# Patient Record
Sex: Female | Born: 1970 | Race: Black or African American | Hispanic: No | Marital: Married | Smoking: Never smoker
Health system: Southern US, Community
[De-identification: ages and names within clinical notes are randomized; demographics above are authoritative.]

## PROBLEM LIST (undated history)

## (undated) ENCOUNTER — Inpatient Hospital Stay (HOSPITAL_COMMUNITY): Payer: Self-pay

## (undated) DIAGNOSIS — Z794 Long term (current) use of insulin: Secondary | ICD-10-CM

## (undated) DIAGNOSIS — G4733 Obstructive sleep apnea (adult) (pediatric): Secondary | ICD-10-CM

## (undated) DIAGNOSIS — Z87898 Personal history of other specified conditions: Secondary | ICD-10-CM

## (undated) DIAGNOSIS — R350 Frequency of micturition: Secondary | ICD-10-CM

## (undated) DIAGNOSIS — N920 Excessive and frequent menstruation with regular cycle: Secondary | ICD-10-CM

## (undated) DIAGNOSIS — I10 Essential (primary) hypertension: Secondary | ICD-10-CM

## (undated) DIAGNOSIS — G629 Polyneuropathy, unspecified: Secondary | ICD-10-CM

## (undated) DIAGNOSIS — E785 Hyperlipidemia, unspecified: Secondary | ICD-10-CM

## (undated) DIAGNOSIS — E1165 Type 2 diabetes mellitus with hyperglycemia: Secondary | ICD-10-CM

## (undated) DIAGNOSIS — R3915 Urgency of urination: Secondary | ICD-10-CM

## (undated) DIAGNOSIS — K602 Anal fissure, unspecified: Secondary | ICD-10-CM

## (undated) DIAGNOSIS — E049 Nontoxic goiter, unspecified: Secondary | ICD-10-CM

## (undated) DIAGNOSIS — K5909 Other constipation: Secondary | ICD-10-CM

## (undated) DIAGNOSIS — Z8619 Personal history of other infectious and parasitic diseases: Secondary | ICD-10-CM

## (undated) DIAGNOSIS — E282 Polycystic ovarian syndrome: Secondary | ICD-10-CM

## (undated) DIAGNOSIS — K219 Gastro-esophageal reflux disease without esophagitis: Secondary | ICD-10-CM

## (undated) HISTORY — DX: Hyperlipidemia, unspecified: E78.5

## (undated) HISTORY — DX: Polycystic ovarian syndrome: E28.2

## (undated) HISTORY — PX: BREAST REDUCTION SURGERY: SHX8

## (undated) HISTORY — DX: Personal history of other infectious and parasitic diseases: Z86.19

## (undated) HISTORY — DX: Essential (primary) hypertension: I10

---

## 1999-10-23 ENCOUNTER — Other Ambulatory Visit: Admission: RE | Admit: 1999-10-23 | Discharge: 1999-10-23 | Payer: Self-pay | Admitting: Obstetrics and Gynecology

## 1999-11-23 ENCOUNTER — Other Ambulatory Visit: Admission: RE | Admit: 1999-11-23 | Discharge: 1999-11-23 | Payer: Self-pay | Admitting: Obstetrics and Gynecology

## 1999-11-23 ENCOUNTER — Encounter (INDEPENDENT_AMBULATORY_CARE_PROVIDER_SITE_OTHER): Payer: Self-pay

## 1999-12-24 ENCOUNTER — Ambulatory Visit (HOSPITAL_COMMUNITY): Admission: RE | Admit: 1999-12-24 | Discharge: 1999-12-24 | Payer: Self-pay

## 2000-02-11 ENCOUNTER — Encounter: Admission: RE | Admit: 2000-02-11 | Discharge: 2000-05-11 | Payer: Self-pay | Admitting: Family Medicine

## 2000-04-23 ENCOUNTER — Other Ambulatory Visit: Admission: RE | Admit: 2000-04-23 | Discharge: 2000-04-23 | Payer: Self-pay | Admitting: Obstetrics and Gynecology

## 2000-07-12 ENCOUNTER — Emergency Department (HOSPITAL_COMMUNITY): Admission: EM | Admit: 2000-07-12 | Discharge: 2000-07-12 | Payer: Self-pay | Admitting: Emergency Medicine

## 2002-05-05 ENCOUNTER — Other Ambulatory Visit: Admission: RE | Admit: 2002-05-05 | Discharge: 2002-05-05 | Payer: Self-pay | Admitting: Obstetrics and Gynecology

## 2002-09-30 ENCOUNTER — Emergency Department (HOSPITAL_COMMUNITY): Admission: EM | Admit: 2002-09-30 | Discharge: 2002-09-30 | Payer: Self-pay | Admitting: Emergency Medicine

## 2002-09-30 ENCOUNTER — Other Ambulatory Visit: Admission: RE | Admit: 2002-09-30 | Discharge: 2002-09-30 | Payer: Self-pay | Admitting: Obstetrics and Gynecology

## 2002-10-12 ENCOUNTER — Encounter: Payer: Self-pay | Admitting: *Deleted

## 2002-10-12 ENCOUNTER — Emergency Department (HOSPITAL_COMMUNITY): Admission: EM | Admit: 2002-10-12 | Discharge: 2002-10-12 | Payer: Self-pay | Admitting: *Deleted

## 2003-06-07 ENCOUNTER — Other Ambulatory Visit: Admission: RE | Admit: 2003-06-07 | Discharge: 2003-06-07 | Payer: Self-pay | Admitting: Obstetrics and Gynecology

## 2003-07-18 ENCOUNTER — Ambulatory Visit (HOSPITAL_BASED_OUTPATIENT_CLINIC_OR_DEPARTMENT_OTHER): Admission: RE | Admit: 2003-07-18 | Discharge: 2003-07-18 | Payer: Self-pay | Admitting: Specialist

## 2003-07-18 ENCOUNTER — Encounter (INDEPENDENT_AMBULATORY_CARE_PROVIDER_SITE_OTHER): Payer: Self-pay | Admitting: *Deleted

## 2003-07-18 ENCOUNTER — Ambulatory Visit (HOSPITAL_COMMUNITY): Admission: RE | Admit: 2003-07-18 | Discharge: 2003-07-18 | Payer: Self-pay | Admitting: Specialist

## 2003-11-13 ENCOUNTER — Emergency Department (HOSPITAL_COMMUNITY): Admission: EM | Admit: 2003-11-13 | Discharge: 2003-11-13 | Payer: Self-pay | Admitting: Emergency Medicine

## 2004-04-02 ENCOUNTER — Emergency Department (HOSPITAL_COMMUNITY): Admission: EM | Admit: 2004-04-02 | Discharge: 2004-04-02 | Payer: Self-pay | Admitting: Emergency Medicine

## 2004-06-07 ENCOUNTER — Ambulatory Visit: Payer: Self-pay | Admitting: Family Medicine

## 2004-06-11 ENCOUNTER — Ambulatory Visit: Payer: Self-pay | Admitting: Family Medicine

## 2004-06-19 ENCOUNTER — Other Ambulatory Visit: Admission: RE | Admit: 2004-06-19 | Discharge: 2004-06-19 | Payer: Self-pay | Admitting: Obstetrics and Gynecology

## 2004-07-04 ENCOUNTER — Ambulatory Visit: Payer: Self-pay | Admitting: Internal Medicine

## 2004-07-11 ENCOUNTER — Ambulatory Visit: Payer: Self-pay | Admitting: Internal Medicine

## 2004-07-18 ENCOUNTER — Ambulatory Visit: Payer: Self-pay | Admitting: Family Medicine

## 2004-08-06 ENCOUNTER — Ambulatory Visit: Payer: Self-pay | Admitting: Internal Medicine

## 2004-10-06 ENCOUNTER — Emergency Department (HOSPITAL_COMMUNITY): Admission: EM | Admit: 2004-10-06 | Discharge: 2004-10-06 | Payer: Self-pay | Admitting: Emergency Medicine

## 2004-11-09 ENCOUNTER — Ambulatory Visit: Payer: Self-pay | Admitting: Family Medicine

## 2004-12-14 ENCOUNTER — Ambulatory Visit: Payer: Self-pay | Admitting: Family Medicine

## 2005-04-18 ENCOUNTER — Ambulatory Visit: Payer: Self-pay | Admitting: Family Medicine

## 2005-06-21 ENCOUNTER — Ambulatory Visit: Payer: Self-pay | Admitting: Family Medicine

## 2005-07-12 ENCOUNTER — Ambulatory Visit: Payer: Self-pay | Admitting: Family Medicine

## 2005-08-03 ENCOUNTER — Ambulatory Visit (HOSPITAL_BASED_OUTPATIENT_CLINIC_OR_DEPARTMENT_OTHER): Admission: RE | Admit: 2005-08-03 | Discharge: 2005-08-03 | Payer: Self-pay | Admitting: Family Medicine

## 2005-08-27 ENCOUNTER — Ambulatory Visit: Payer: Self-pay | Admitting: Pulmonary Disease

## 2005-10-14 ENCOUNTER — Ambulatory Visit: Payer: Self-pay | Admitting: Pulmonary Disease

## 2005-10-18 ENCOUNTER — Ambulatory Visit: Payer: Self-pay | Admitting: Family Medicine

## 2006-01-30 ENCOUNTER — Ambulatory Visit: Payer: Self-pay | Admitting: Pulmonary Disease

## 2006-05-02 ENCOUNTER — Ambulatory Visit: Payer: Self-pay | Admitting: Family Medicine

## 2006-05-30 ENCOUNTER — Ambulatory Visit: Payer: Self-pay | Admitting: Family Medicine

## 2006-05-30 LAB — CONVERTED CEMR LAB
AST: 31 units/L (ref 0–37)
Albumin: 4 g/dL (ref 3.5–5.2)
Alkaline Phosphatase: 64 units/L (ref 39–117)
BUN: 12 mg/dL (ref 6–23)
Basophils Absolute: 0 10*3/uL (ref 0.0–0.1)
Basophils Relative: 0.5 % (ref 0.0–1.0)
CO2: 27 meq/L (ref 19–32)
Chloride: 101 meq/L (ref 96–112)
Creatinine, Ser: 0.8 mg/dL (ref 0.4–1.2)
HCT: 37.4 % (ref 36.0–46.0)
MCHC: 34.6 g/dL (ref 30.0–36.0)
Neutrophils Relative %: 48.2 % (ref 43.0–77.0)
RBC: 4.19 M/uL (ref 3.87–5.11)
RDW: 12.2 % (ref 11.5–14.6)
Total Bilirubin: 0.7 mg/dL (ref 0.3–1.2)

## 2006-06-03 ENCOUNTER — Encounter: Payer: Self-pay | Admitting: Family Medicine

## 2006-09-18 DIAGNOSIS — I10 Essential (primary) hypertension: Secondary | ICD-10-CM | POA: Insufficient documentation

## 2006-09-19 ENCOUNTER — Telehealth (INDEPENDENT_AMBULATORY_CARE_PROVIDER_SITE_OTHER): Payer: Self-pay | Admitting: *Deleted

## 2006-09-26 ENCOUNTER — Telehealth (INDEPENDENT_AMBULATORY_CARE_PROVIDER_SITE_OTHER): Payer: Self-pay | Admitting: *Deleted

## 2006-09-30 ENCOUNTER — Ambulatory Visit: Payer: Self-pay | Admitting: Family Medicine

## 2006-10-01 ENCOUNTER — Telehealth (INDEPENDENT_AMBULATORY_CARE_PROVIDER_SITE_OTHER): Payer: Self-pay | Admitting: *Deleted

## 2007-01-29 ENCOUNTER — Encounter (INDEPENDENT_AMBULATORY_CARE_PROVIDER_SITE_OTHER): Payer: Self-pay | Admitting: *Deleted

## 2007-05-21 ENCOUNTER — Ambulatory Visit: Payer: Self-pay | Admitting: Family Medicine

## 2007-05-21 DIAGNOSIS — I10 Essential (primary) hypertension: Secondary | ICD-10-CM | POA: Insufficient documentation

## 2007-05-21 DIAGNOSIS — J019 Acute sinusitis, unspecified: Secondary | ICD-10-CM

## 2007-07-15 ENCOUNTER — Emergency Department (HOSPITAL_COMMUNITY): Admission: EM | Admit: 2007-07-15 | Discharge: 2007-07-15 | Payer: Self-pay | Admitting: Emergency Medicine

## 2007-09-24 ENCOUNTER — Telehealth (INDEPENDENT_AMBULATORY_CARE_PROVIDER_SITE_OTHER): Payer: Self-pay | Admitting: *Deleted

## 2007-11-10 ENCOUNTER — Ambulatory Visit: Payer: Self-pay | Admitting: Family Medicine

## 2007-11-10 DIAGNOSIS — L0390 Cellulitis, unspecified: Secondary | ICD-10-CM

## 2007-11-10 DIAGNOSIS — L0291 Cutaneous abscess, unspecified: Secondary | ICD-10-CM | POA: Insufficient documentation

## 2007-12-23 ENCOUNTER — Ambulatory Visit: Payer: Self-pay | Admitting: Family Medicine

## 2007-12-27 ENCOUNTER — Emergency Department (HOSPITAL_COMMUNITY): Admission: EM | Admit: 2007-12-27 | Discharge: 2007-12-27 | Payer: Self-pay | Admitting: Emergency Medicine

## 2008-01-28 ENCOUNTER — Encounter: Payer: Self-pay | Admitting: Family Medicine

## 2008-05-27 ENCOUNTER — Emergency Department (HOSPITAL_COMMUNITY): Admission: EM | Admit: 2008-05-27 | Discharge: 2008-05-27 | Payer: Self-pay | Admitting: Emergency Medicine

## 2008-06-13 ENCOUNTER — Ambulatory Visit: Payer: Self-pay | Admitting: Family Medicine

## 2008-06-13 LAB — CONVERTED CEMR LAB
Ketones, urine, test strip: NEGATIVE
Nitrite: NEGATIVE
Protein, U semiquant: NEGATIVE
Urobilinogen, UA: NEGATIVE

## 2008-06-14 ENCOUNTER — Encounter: Payer: Self-pay | Admitting: Family Medicine

## 2008-06-16 ENCOUNTER — Encounter: Payer: Self-pay | Admitting: Family Medicine

## 2008-06-17 ENCOUNTER — Encounter (INDEPENDENT_AMBULATORY_CARE_PROVIDER_SITE_OTHER): Payer: Self-pay | Admitting: *Deleted

## 2008-07-01 LAB — CONVERTED CEMR LAB
ALT: 51 units/L — ABNORMAL HIGH (ref 0–35)
Bilirubin, Direct: 0 mg/dL (ref 0.0–0.3)
Calcium: 9.2 mg/dL (ref 8.4–10.5)
GFR calc non Af Amer: 91.38 mL/min (ref >60)
Glucose, Bld: 162 mg/dL — ABNORMAL HIGH (ref 70–99)
HDL: 36.1 mg/dL — ABNORMAL LOW (ref >39.00)
MCHC: 34.4 g/dL (ref 30.0–36.0)
RBC: 4.73 M/uL (ref 3.87–5.11)
Sodium: 139 meq/L (ref 135–145)
TSH: 1.17 microintl units/mL (ref 0.35–5.50)
Total Bilirubin: 0.7 mg/dL (ref 0.3–1.2)
Total CHOL/HDL Ratio: 6
Triglycerides: 152 mg/dL — ABNORMAL HIGH (ref 0.0–149.0)
VLDL: 30.4 mg/dL (ref 0.0–40.0)
WBC: 5.9 10*3/uL (ref 4.5–10.5)

## 2008-07-04 ENCOUNTER — Encounter (INDEPENDENT_AMBULATORY_CARE_PROVIDER_SITE_OTHER): Payer: Self-pay | Admitting: *Deleted

## 2008-09-21 ENCOUNTER — Telehealth (INDEPENDENT_AMBULATORY_CARE_PROVIDER_SITE_OTHER): Payer: Self-pay | Admitting: *Deleted

## 2008-10-21 ENCOUNTER — Encounter: Payer: Self-pay | Admitting: Family Medicine

## 2008-12-13 ENCOUNTER — Encounter: Payer: Self-pay | Admitting: Family Medicine

## 2008-12-22 ENCOUNTER — Telehealth: Payer: Self-pay | Admitting: Family Medicine

## 2009-01-10 ENCOUNTER — Ambulatory Visit: Payer: Self-pay | Admitting: Family Medicine

## 2009-01-10 DIAGNOSIS — E040 Nontoxic diffuse goiter: Secondary | ICD-10-CM | POA: Insufficient documentation

## 2009-01-10 DIAGNOSIS — R1310 Dysphagia, unspecified: Secondary | ICD-10-CM | POA: Insufficient documentation

## 2009-01-12 ENCOUNTER — Encounter: Payer: Self-pay | Admitting: Family Medicine

## 2009-01-12 LAB — CONVERTED CEMR LAB: TSH: 1.34 microintl units/mL (ref 0.35–5.50)

## 2009-01-13 ENCOUNTER — Encounter: Admission: RE | Admit: 2009-01-13 | Discharge: 2009-01-13 | Payer: Self-pay | Admitting: Internal Medicine

## 2009-02-03 ENCOUNTER — Telehealth (INDEPENDENT_AMBULATORY_CARE_PROVIDER_SITE_OTHER): Payer: Self-pay | Admitting: *Deleted

## 2009-05-12 ENCOUNTER — Encounter: Payer: Self-pay | Admitting: Family Medicine

## 2009-06-29 ENCOUNTER — Ambulatory Visit: Payer: Self-pay | Admitting: Family Medicine

## 2009-07-13 ENCOUNTER — Telehealth (INDEPENDENT_AMBULATORY_CARE_PROVIDER_SITE_OTHER): Payer: Self-pay | Admitting: *Deleted

## 2009-08-02 ENCOUNTER — Ambulatory Visit: Payer: Self-pay | Admitting: Family Medicine

## 2009-08-02 DIAGNOSIS — E282 Polycystic ovarian syndrome: Secondary | ICD-10-CM

## 2009-08-07 LAB — CONVERTED CEMR LAB
ALT: 32 units/L (ref 0–35)
AST: 23 units/L (ref 0–37)
Alkaline Phosphatase: 72 units/L (ref 39–117)
Basophils Absolute: 0 10*3/uL (ref 0.0–0.1)
Calcium: 10.1 mg/dL (ref 8.4–10.5)
Eosinophils Relative: 1.9 % (ref 0.0–5.0)
GFR calc non Af Amer: 90.8 mL/min (ref >60)
HCT: 43.7 % (ref 36.0–46.0)
HDL: 41.9 mg/dL (ref >39.00)
Hemoglobin: 14.6 g/dL (ref 12.0–15.0)
Lymphocytes Relative: 41.4 % (ref 12.0–46.0)
Lymphs Abs: 3.8 10*3/uL (ref 0.7–4.0)
Monocytes Relative: 5.5 % (ref 3.0–12.0)
Neutro Abs: 4.6 10*3/uL (ref 1.4–7.7)
Platelets: 339 10*3/uL (ref 150.0–400.0)
Potassium: 3.6 meq/L (ref 3.5–5.1)
Sodium: 143 meq/L (ref 135–145)
TSH: 2.03 microintl units/mL (ref 0.35–5.50)
Total Bilirubin: 0.6 mg/dL (ref 0.3–1.2)
Triglycerides: 80 mg/dL (ref 0.0–149.0)
VLDL: 16 mg/dL (ref 0.0–40.0)
WBC: 9.1 10*3/uL (ref 4.5–10.5)

## 2009-08-31 ENCOUNTER — Telehealth (INDEPENDENT_AMBULATORY_CARE_PROVIDER_SITE_OTHER): Payer: Self-pay | Admitting: *Deleted

## 2009-09-01 ENCOUNTER — Ambulatory Visit: Payer: Self-pay | Admitting: Family Medicine

## 2009-09-08 ENCOUNTER — Encounter: Payer: Self-pay | Admitting: Family Medicine

## 2009-10-04 ENCOUNTER — Emergency Department (HOSPITAL_COMMUNITY): Admission: EM | Admit: 2009-10-04 | Discharge: 2009-10-04 | Payer: Self-pay | Admitting: Family Medicine

## 2009-11-02 ENCOUNTER — Encounter: Admission: RE | Admit: 2009-11-02 | Discharge: 2009-12-29 | Payer: Self-pay | Admitting: Family Medicine

## 2009-11-02 ENCOUNTER — Encounter: Payer: Self-pay | Admitting: Family Medicine

## 2009-12-08 ENCOUNTER — Ambulatory Visit: Payer: Self-pay | Admitting: Family Medicine

## 2009-12-08 DIAGNOSIS — R519 Headache, unspecified: Secondary | ICD-10-CM | POA: Insufficient documentation

## 2009-12-08 DIAGNOSIS — R51 Headache: Secondary | ICD-10-CM

## 2009-12-11 LAB — CONVERTED CEMR LAB
AST: 36 units/L (ref 0–37)
Albumin: 4.3 g/dL (ref 3.5–5.2)
BUN: 11 mg/dL (ref 6–23)
Basophils Absolute: 0 10*3/uL (ref 0.0–0.1)
Basophils Relative: 0 % (ref 0–1)
Bilirubin, Direct: <0.1 mg/dL (ref 0.0–0.3)
Calcium: 10.2 mg/dL (ref 8.4–10.5)
Creatinine, Ser: 0.82 mg/dL (ref 0.40–1.20)
Eosinophils Absolute: 0.2 10*3/uL (ref 0.0–0.7)
Eosinophils Relative: 2 % (ref 0–5)
Glucose, Bld: 92 mg/dL (ref 70–99)
Hemoglobin: 13.1 g/dL (ref 12.0–15.0)
MCHC: 32.9 g/dL (ref 30.0–36.0)
MCV: 89.4 fL (ref 78.0–100.0)
Monocytes Absolute: 0.5 10*3/uL (ref 0.1–1.0)
Monocytes Relative: 6 % (ref 3–12)
Neutro Abs: 3.5 10*3/uL (ref 1.7–7.7)
Potassium: 4.1 meq/L (ref 3.5–5.3)
RBC: 4.45 M/uL (ref 3.87–5.11)
RDW: 12.8 % (ref 11.5–15.5)
Total Bilirubin: 0.2 mg/dL — ABNORMAL LOW (ref 0.3–1.2)

## 2009-12-14 ENCOUNTER — Ambulatory Visit: Payer: Self-pay | Admitting: Internal Medicine

## 2010-01-01 ENCOUNTER — Ambulatory Visit: Payer: Self-pay | Admitting: Family Medicine

## 2010-01-11 ENCOUNTER — Encounter: Payer: Self-pay | Admitting: Family Medicine

## 2010-02-14 ENCOUNTER — Emergency Department (HOSPITAL_COMMUNITY): Admission: EM | Admit: 2010-02-14 | Discharge: 2010-02-14 | Payer: Self-pay | Admitting: Emergency Medicine

## 2010-02-14 ENCOUNTER — Emergency Department (HOSPITAL_COMMUNITY): Admission: EM | Admit: 2010-02-14 | Discharge: 2010-02-14 | Payer: Self-pay | Admitting: Family Medicine

## 2010-04-29 LAB — CONVERTED CEMR LAB
ALT: 40 units/L — ABNORMAL HIGH (ref 0–35)
BUN: 10 mg/dL (ref 6–23)
Bilirubin, Direct: 0 mg/dL (ref 0.0–0.3)
Chloride: 107 meq/L (ref 96–112)
GFR calc non Af Amer: 104.08 mL/min (ref >60)
Glucose, Bld: 129 mg/dL — ABNORMAL HIGH (ref 70–99)
Potassium: 3.8 meq/L (ref 3.5–5.1)
Total Bilirubin: 0.5 mg/dL (ref 0.3–1.2)

## 2010-05-01 NOTE — Assessment & Plan Note (Signed)
Summary: cough,congestion/alr   Vital Signs:  Patient profile:   40 year old female Weight:      229 pounds O2 Sat:      100 % on Room air Temp:     100.4 degrees F oral Pulse rate:   76 / minute BP sitting:   120 / 84  (left arm)  Vitals Entered By: Doristine Devoid (September 01, 2009 1:11 PM)  O2 Flow:  Room air CC: cough and congestion    History of Present Illness: 40 yo woman here today for cough and congestion.  has been using steroid nasal spray during allergy season w/ adequate results until last weekend when chest became sore, congested, now coughing- intermittantly productive.  + nasal congestion in AM.  no fevers at home.  + facial pain/pressure, HAs.  intermittant ear pain.  Allergies (verified): No Known Drug Allergies  Review of Systems      See HPI  Physical Exam  General:  Well-developed,well-nourished,in no acute distress; alert,appropriate and cooperative throughout examination Head:  Normocephalic and atraumatic without obvious abnormalities. No apparent alopecia or balding.  + TTP over frontal and maxillary sinuses Eyes:  no injxn or inflammation, + bilateral allergic shiners Ears:  External ear exam shows no significant lesions or deformities.  Otoscopic examination reveals clear canals, tympanic membranes are intact bilaterally without bulging, retraction, inflammation or discharge. Hearing is grossly normal bilaterally. Nose:  marked turbinate edema Mouth:  Oral mucosa and oropharynx without lesions or exudates.  Teeth in good repair.  + PND Neck:  No deformities, masses, or tenderness noted. Lungs:  Normal respiratory effort, chest expands symmetrically. Lungs are clear to auscultation, no crackles or wheezes. Heart:  normal rate and no murmur.     Impression & Recommendations:  Problem # 1:  SINUSITIS- ACUTE-NOS (ICD-461.9) Assessment Unchanged pt's sxs and PE consistent w/ sinus infxn.  start high dose amox.  reviewed supportive care and red flags that  should prompt return.  Pt expresses understanding and is in agreement w/ this plan. The following medications were removed from the medication list:    Hydromet 5-1.5 Mg/85ml Syrp (Hydrocodone-homatropine) .Marland Kitchen... As needed. 1 tsp. Her updated medication list for this problem includes:    Amoxicillin 500 Mg Tabs (Amoxicillin) .Marland Kitchen... 2 tabs by mouth two times a day x10 days.  Complete Medication List: 1)  Metformin Hcl 500 Mg Xr24h-tab (Metformin hcl) .Marland Kitchen.. 1 by mouth once daily 2)  Nifedical Xl 30 Mg Xr24h-tab (Nifedipine) .... One tablet daily. 3)  P D Natal Vitamins/folic Acid Tabs (Prenatal multivit-min-fe-fa) .... As directed. 4)  Glimepiride 1 Mg Tabs (Glimepiride) .Marland Kitchen.. 1 by mouth daily. 5)  Vitamin D (ergocalciferol) 50000 Unit Caps (Ergocalciferol) .Marland Kitchen.. 1 by mouth weekly 6)  Nexium 40 Mg Cpdr (Esomeprazole magnesium) .Marland Kitchen.. 1 by mouth once daily 7)  Amoxicillin 500 Mg Tabs (Amoxicillin) .... 2 tabs by mouth two times a day x10 days.  Patient Instructions: 1)  Take the Amoxicillin for your sinus infection- take w/ food to avoid upset stomach 2)  Start OTC Zyrtec for allergy component 3)  Ibuprofen/Tylenol as needed for pain or fever 4)  Drink plenty of fluids 5)  Mucinex to thin your chest congestion 6)  Hang in there! Prescriptions: AMOXICILLIN 500 MG TABS (AMOXICILLIN) 2 tabs by mouth two times a day x10 days.  #40 x 0   Entered and Authorized by:   Neena Rhymes MD   Signed by:   Neena Rhymes MD on 09/01/2009   Method  used:   Electronically to        [facility]* (retail)       [address]       [place], [place]  [address]       Ph: [phone]       Fax: [phone]   RxID:   [id number]

## 2010-05-01 NOTE — Assessment & Plan Note (Signed)
Summary: cpx//pt will be fasting//lch   Vital Signs:  Patient profile:   40 year old female Height:      68 inches Weight:      227 pounds Pulse rate:   78 / minute Pulse rhythm:   regular BP sitting:   122 / 84  (left arm) Cuff size:   regular  Vitals Entered By: Army Fossa CMA (Aug 02, 2009 8:34 AM) CC: Pt here for CPX, no pap. Pt is fasting.   History of Present Illness: Pt here for cpe and labs.   Pt has gyn--Dr Dareen Piano.   No complaints.    Preventive Screening-Counseling & Management  Alcohol-Tobacco     Alcohol drinks/day: 0     Smoking Status: never  Caffeine-Diet-Exercise     Caffeine use/day: 0     Caffeine Counseling: not indicated; caffeine use is not excessive or problematic     Does Patient Exercise: no     Exercise Counseling: to improve exercise regimen  Hep-HIV-STD-Contraception     STD Risk: no risk noted     Dental Visit-last 6 months yes     SBE monthly: yes     SBE Education/Counseling: not indicated; SBE done regularly  Safety-Violence-Falls     Seat Belt Use: yes      Sexual History:  currently monogamous.        Drug Use:  never.    Current Medications (verified): 1)  Metformin Hcl 500 Mg Xr24h-Tab (Metformin Hcl) .Marland Kitchen.. 1 By Mouth Once Daily 2)  Nifedical Xl 30 Mg Xr24h-Tab (Nifedipine) .... One Tablet Daily. 3)  P D Natal Vitamins/folic Acid  Tabs (Prenatal Multivit-Min-Fe-Fa) .... As Directed. 4)  Glimepiride 1 Mg Tabs (Glimepiride) .Marland Kitchen.. 1 By Mouth Daily. 5)  Vitamin D (Ergocalciferol) 50000 Unit Caps (Ergocalciferol) .Marland Kitchen.. 1 By Mouth Weekly 6)  Nexium 40 Mg Cpdr (Esomeprazole Magnesium) .Marland Kitchen.. 1 By Mouth Once Daily 7)  Hydromet 5-1.5 Mg/72ml Syrp (Hydrocodone-Homatropine) .... As Needed. 1 Tsp. 8)  Megestrol Acetate 40 Mg Tabs (Megestrol Acetate) .... 2 Tab By Mouth Two Times A Day  Allergies (verified): No Known Drug Allergies  Past History:  Past Medical History: Last updated:  05/21/2007 PCOS Hypertension Hyperlipidemia  Past Surgical History: Last updated: 09/18/2006 Breast reduction-07/2003  Family History: Last updated: 09/18/2006 Family History of Arthritis Family History Diabetes 1st degree relative Family History Hypertension FAm hx Stroke  Social History: Last updated: 06/13/2008 Single Never Smoked Alcohol use-no Drug use-no Regular exercise-no Occupation-- [employer] [place]  Risk Factors: Alcohol Use: 0 (08/02/2009) Caffeine Use: 0 (08/02/2009) Exercise: no (08/02/2009)  Risk Factors: Smoking Status: never (08/02/2009)  Family History: Reviewed history from 09/18/2006 and no changes required. Family History of Arthritis Family History Diabetes 1st degree relative Family History Hypertension FAm hx Stroke  Social History: Reviewed history from 06/13/2008 and no changes required. Single Never Smoked Alcohol use-no Drug use-no Regular exercise-no Occupation-- [employer] [place] Caffeine use/day:  0 Dental Care w/in 6 mos.:  yes Seat Belt Use:  yes STD Risk:  no risk noted Sexual History:  currently monogamous Drug Use:  never  Review of Systems      See HPI General:  Denies chills, fatigue, fever, loss of appetite, malaise, sleep disorder, sweats, weakness, and weight loss. Eyes:  Denies blurring, discharge, double vision, eye irritation, eye pain, halos, itching, light sensitivity, red eye, vision loss-1 eye, and vision loss-both eyes; optho--q1y. ENT:  Denies decreased hearing, difficulty swallowing, ear discharge, earache, hoarseness, nasal  congestion, nosebleeds, postnasal drainage, ringing in ears, sinus pressure, and sore throat. CV:  Denies bluish discoloration of lips or nails, chest pain or discomfort, difficulty breathing at night, difficulty breathing while lying down, fainting, fatigue, leg cramps with exertion, lightheadness, near fainting, palpitations, shortness of breath with exertion,  swelling of feet, swelling of hands, and weight gain. Resp:  Denies chest discomfort, chest pain with inspiration, cough, coughing up blood, excessive snoring, hypersomnolence, morning headaches, pleuritic, shortness of breath, sputum productive, and wheezing. GI:  Denies abdominal pain, bloody stools, change in bowel habits, constipation, dark tarry stools, diarrhea, excessive appetite, gas, hemorrhoids, indigestion, loss of appetite, and nausea. GU:  Denies abnormal vaginal bleeding, decreased libido, discharge, dysuria, genital sores, hematuria, incontinence, nocturia, urinary frequency, and urinary hesitancy. MS:  Denies joint pain, joint redness, joint swelling, loss of strength, low back pain, mid back pain, muscle aches, muscle , cramps, muscle weakness, stiffness, and thoracic pain. Derm:  Denies changes in color of skin, changes in nail beds, dryness, excessive perspiration, flushing, hair loss, insect bite(s), itching, lesion(s), poor wound healing, and rash. Neuro:  Denies brief paralysis, difficulty with concentration, disturbances in coordination, falling down, headaches, inability to speak, memory loss, numbness, poor balance, seizures, sensation of room spinning, tingling, tremors, visual disturbances, and weakness. Psych:  Denies alternate hallucination ( auditory/visual), anxiety, depression, easily angered, easily tearful, irritability, mental problems, panic attacks, sense of great danger, suicidal thoughts/plans, thoughts of violence, unusual visions or sounds, and thoughts /plans of harming others. Endo:  Denies cold intolerance, excessive hunger, excessive thirst, excessive urination, heat intolerance, polyuria, and weight change. Heme:  Denies abnormal bruising, bleeding, enlarge lymph nodes, fevers, pallor, and skin discoloration. Allergy:  Denies hives or rash, itching eyes, persistent infections, seasonal allergies, and sneezing.  Physical Exam  General:   Well-developed,well-nourished,in no acute distress; alert,appropriate and cooperative throughout examination Head:  Normocephalic and atraumatic without obvious abnormalities. No apparent alopecia or balding. Eyes:  vision grossly intact, pupils equal, pupils round, pupils reactive to light, and no injection.   Ears:  External ear exam shows no significant lesions or deformities.  Otoscopic examination reveals clear canals, tympanic membranes are intact bilaterally without bulging, retraction, inflammation or discharge. Hearing is grossly normal bilaterally. Nose:  External nasal examination shows no deformity or inflammation. Nasal mucosa are pink and moist without lesions or exudates. Mouth:  Oral mucosa and oropharynx without lesions or exudates.  Teeth in good repair. Neck:  No deformities, masses, or tenderness noted. Breasts:  GYN Lungs:  Normal respiratory effort, chest expands symmetrically. Lungs are clear to auscultation, no crackles or wheezes. Heart:  normal rate and no murmur.   Abdomen:  Bowel sounds positive,abdomen soft and non-tender without masses, organomegaly or hernias noted. Genitalia:  GYN Msk:  normal ROM, no joint tenderness, no joint swelling, no joint warmth, no redness over joints, no joint deformities, no joint instability, and no crepitation.   Pulses:  R posterior tibial normal, R dorsalis pedis normal, R carotid normal, L posterior tibial normal, L dorsalis pedis normal, and L carotid normal.   Extremities:  No clubbing, cyanosis, edema, or deformity noted with normal full range of motion of all joints.   Neurologic:  No cranial nerve deficits noted. Station and gait are normal. Plantar reflexes are down-going bilaterally. DTRs are symmetrical throughout. Sensory, motor and coordinative functions appear intact. Skin:  Intact without suspicious lesions or rashes Cervical Nodes:  No lymphadenopathy noted Psych:  Cognition and judgment appear intact. Alert and  cooperative with  normal attention span and concentration. 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 One tablet daily.  Orders: Venipuncture ([address]) TLB-Lipid Panel (80061-LIPID) TLB-BMP (Basic Metabolic Panel-BMET) (80048-METABOL) TLB-CBC Platelet - w/Differential (85025-CBCD) TLB-Hepatic/Liver Function Pnl (80076-HEPATIC) TLB-TSH (Thyroid Stimulating Hormone) (84443-TSH)  BP today: 122/84 Prior BP: 112/78 (06/29/2009)  Labs Reviewed: K+: 3.8 (06/29/2009) Creat: : 0.8 (06/29/2009)   Chol: 215 (06/13/2008)   HDL: 36.10 (06/13/2008)   TG: 152.0 (06/13/2008)  Complete Medication List: 1)  Metformin Hcl 500 Mg Xr24h-tab (Metformin hcl) .Marland Kitchen.. 1 by mouth once daily 2)  Nifedical Xl 30 Mg Xr24h-tab (Nifedipine) .... One tablet daily. 3)  P D Natal Vitamins/folic Acid Tabs (Prenatal multivit-min-fe-fa) .... As directed. 4)  Glimepiride 1 Mg Tabs (Glimepiride) .Marland Kitchen.. 1 by mouth daily. 5)  Vitamin D (ergocalciferol) 50000 Unit Caps (Ergocalciferol) .Marland Kitchen.. 1 by mouth weekly 6)  Nexium 40 Mg Cpdr (Esomeprazole magnesium) .Marland Kitchen.. 1 by mouth once daily 7)   Hydromet 5-1.5 Mg/51ml Syrp (Hydrocodone-homatropine) .... As needed. 1 tsp. 8)  Megestrol Acetate 40 Mg Tabs (Megestrol acetate) .... 2 tab by mouth two times a day    Flu Vaccine Next Due:  Not Indicated PAP Result Date:  10/12/2008 PAP Result:  normal PAP Next Due:  1 yr   Appended Document: cpx//pt will be fasting//lch  Laboratory Results   Urine Tests   Date/Time Reported: Aug 02, 2009 12:41 PM   Routine Urinalysis   Color: yellow Appearance: Clear Glucose: negative   (Normal Range: Negative) Bilirubin: negative   (Normal Range: Negative) Ketone: negative   (Normal Range: Negative) Spec. Gravity: >=1.030   (Normal Range: 1.003-1.035) Blood: negative   (Normal Range: Negative) pH: 5.0   (Normal Range: 5.0-8.0) Protein: negative   (Normal Range: Negative) Urobilinogen: negative   (Normal Range: 0-1) Nitrite: negative   (Normal Range: Negative) Leukocyte Esterace: negative   (Normal Range: Negative)    Comments: Floydene Flock  Aug 02, 2009 12:41 PM

## 2010-05-01 NOTE — Assessment & Plan Note (Signed)
Summary: checkup on cholesterol/kdc   Vital Signs:  Patient profile:   40 year old female Height:      68 inches Weight:      227 pounds BMI:     34.64 Pulse rate:   80 / minute Pulse rhythm:   regular BP sitting:   112 / 78  (left arm) Cuff size:   regular  Vitals Entered By: Army Fossa CMA (June 29, 2009 9:42 AM) CC: Pt here to follow up on cholesterol. Pt is fasting.   History of Present Illness:  Hyperlipidemia follow-up      This is a 40 year old woman who presents for Hyperlipidemia follow-up.  gyn took pt off lipid med because she is trying to get pregnant.  The patient denies muscle aches, GI upset, abdominal pain, flushing, itching, constipation, diarrhea, and fatigue.  The patient denies the following symptoms: chest pain/pressure, exercise intolerance, dypsnea, palpitations, syncope, and pedal edema.  Dietary compliance has been poor.  The patient reports no exercise.    Current Medications (verified): 1)  Metformin Hcl 500 Mg Xr24h-Tab (Metformin Hcl) .Marland Kitchen.. 1 By Mouth Once Daily 2)  Nifedical Xl 30 Mg Xr24h-Tab (Nifedipine) .... One Tablet Daily. 3)  P D Natal Vitamins/folic Acid  Tabs (Prenatal Multivit-Min-Fe-Fa) .... As Directed. 4)  Glimepiride 1 Mg Tabs (Glimepiride) .Marland Kitchen.. 1 By Mouth Daily. 5)  Vitamin D (Ergocalciferol) 50000 Unit Caps (Ergocalciferol) .Marland Kitchen.. 1 By Mouth Weekly 6)  Nexium 40 Mg Cpdr (Esomeprazole Magnesium) .Marland Kitchen.. 1 By Mouth Once Daily 7)  Hydromet 5-1.5 Mg/79ml Syrp (Hydrocodone-Homatropine) .... As Needed. 1 Tsp. 8)  Megestrol Acetate 40 Mg Tabs (Megestrol Acetate) .... 2 Tab By Mouth Two Times A Day  Allergies (verified): No Known Drug Allergies  Past History:  Family History: Last updated: 09/18/2006 Family History of Arthritis Family History Diabetes 1st degree relative Family History Hypertension FAm hx Stroke  Social History: Last updated: 06/13/2008 Single Never Smoked Alcohol use-no Drug use-no Regular  exercise-no Occupation-- [employer] [place]  Risk Factors: Alcohol Use: 0 (06/13/2008) Caffeine Use: 1-2 sodas a week (06/13/2008) Exercise: no (06/13/2008)  Risk Factors: Smoking Status: never (06/13/2008)  Past medical, surgical, family and social histories (including risk factors) reviewed for relevance to current acute and chronic problems.  Past Medical History: Reviewed history from 05/21/2007 and no changes required. PCOS Hypertension Hyperlipidemia  Past Surgical History: Reviewed history from 09/18/2006 and no changes required. Breast reduction-07/2003  Family History: Reviewed history from 09/18/2006 and no changes required. Family History of Arthritis Family History Diabetes 1st degree relative Family History Hypertension FAm hx Stroke  Social History: Reviewed history from 06/13/2008 and no changes required. Single Never Smoked Alcohol use-no Drug use-no Regular exercise-no Occupation-- [employer] [place]  Review of Systems      See HPI  Physical Exam  General:  Well-developed,well-nourished,in no acute distress; alert,appropriate and cooperative throughout examination Lungs:  Normal respiratory effort, chest expands symmetrically. Lungs are clear to auscultation, no crackles or wheezes. Heart:  normal rate and no murmur.   Psych:  Oriented X3 and normally interactive.     Impression & Recommendations:  Problem # 1:  HYPERLIPIDEMIA (ICD-272.4)  The following medications were removed from the medication list:    Trilipix 135 Mg Cpdr (Choline fenofibrate) .Marland Kitchen... Take one tablet daily.**labs due now**    Fenofibrate 160 Mg Tabs (Fenofibrate) .Marland Kitchen... 1 by mouth once daily.  Orders: Venipuncture ([address]) TLB-Hepatic/Liver Function Pnl (80076-HEPATIC) T-NMR, Lipoprofile ([id number]) TLB-BMP (Basic Metabolic Panel-BMET) (80048-METABOL)  Nutrition Referral (Nutrition)  Labs Reviewed: SGOT: 43 (06/13/2008)   SGPT: 51 (06/13/2008)    HDL:36.10 (06/13/2008)  Chol:215 (06/13/2008)  Trig:152.0 (06/13/2008)  Problem # 2:  MORBID OBESITY (ICD-278.01)  Orders: Nutrition Referral (Nutrition)  Ht: 68 (06/29/2009)   Wt: 227 (06/29/2009)   BMI: 34.64 (06/29/2009)  Problem # 3:  HYPERTENSION (ICD-401.9)  Her updated medication list for this problem includes:    Nifedical Xl 30 Mg Xr24h-tab (Nifedipine) ..... One tablet daily.  Orders: TLB-BMP (Basic Metabolic Panel-BMET) (80048-METABOL) Nutrition Referral (Nutrition)  BP today: 112/78 Prior BP: 120/80 (01/10/2009)  Labs Reviewed: K+: 3.9 (06/13/2008) Creat: : 0.9 (06/13/2008)   Chol: 215 (06/13/2008)   HDL: 36.10 (06/13/2008)   TG: 152.0 (06/13/2008)  Complete Medication List: 1)  Metformin Hcl 500 Mg Xr24h-tab (Metformin hcl) .Marland Kitchen.. 1 by mouth once daily 2)  Nifedical Xl 30 Mg Xr24h-tab (Nifedipine) .... One tablet daily. 3)  P D Natal Vitamins/folic Acid Tabs (Prenatal multivit-min-fe-fa) .... As directed. 4)  Glimepiride 1 Mg Tabs (Glimepiride) .Marland Kitchen.. 1 by mouth daily. 5)  Vitamin D (ergocalciferol) 50000 Unit Caps (Ergocalciferol) .Marland Kitchen.. 1 by mouth weekly 6)  Nexium 40 Mg Cpdr (Esomeprazole magnesium) .Marland Kitchen.. 1 by mouth once daily 7)  Hydromet 5-1.5 Mg/40ml Syrp (Hydrocodone-homatropine) .... As needed. 1 tsp. 8)  Megestrol Acetate 40 Mg Tabs (Megestrol acetate) .... 2 tab by mouth two times a day

## 2010-05-01 NOTE — Letter (Signed)
Summary: [facility]   Imported By: Lanelle Bal 09/16/2009 10:52:14  _____________________________________________________________________  External Attachment:    Type:   Image     Comment:   External Document

## 2010-05-01 NOTE — Progress Notes (Signed)
  Phone Note Call from Patient Call back at Home Phone [phone]   Caller: Patient Summary of Call: nasal congestion, cough hard to get up, using Mucinex.  OV scheduled .Kandice Hams  August 31, 2009 2:57 PM  Initial call taken by: Kandice Hams,  August 31, 2009 2:57 PM

## 2010-05-01 NOTE — Progress Notes (Signed)
Summary: Lab Results   Phone Note Outgoing Call   Call placed by: Army Fossa CMA,  July 13, 2009 9:01 AM Summary of Call: Regarding lab results, LMTCB:  Pt needs cholesterol med but if still trying to get pregnant we need to hold off Signed by Loreen Freud DO on 07/12/2009 at 2:38 PM  Follow-up for Phone Call        Pt is still trying to get pregnant. Informed pt that she needed watch her diet and exericse. Army Fossa CMA  July 13, 2009 9:36 AM

## 2010-05-01 NOTE — Assessment & Plan Note (Signed)
Summary: HEADACHE, NO FEVER///SPH   Vital Signs:  Patient profile:   40 year old female Weight:      230.0 pounds Temp:     98.3 degrees F oral Pulse rate:   76 / minute Pulse rhythm:   regular BP sitting:   140 / 96  (left arm) CC: c/o headaches everyday for 1 week, Headaches   History of Present Illness:  Headaches      This is a 40 year old woman who presents with Headaches.  The symptoms began 2 weeks ago.  Pt here secondary to headache that comes and goes --Ibuprofen helps but it is not gone.  The patient complains of sinus pain and sinus pressure, but denies nausea, vomiting, sweats, tearing of eyes, nasal congestion, photophobia, and phonophobia.  The headache is described as intermittent and dull.  The patient denies the following high-risk features: fever, neck pain/stiffness, vision loss or change, focal weakness, altered mental status, rash, trauma, pain worse with exertion, new type of headache, age >50 years, immunosuppression, concomitant infection, and anticoagulation use.  Prior treatment has included no medication.    Preventive Screening-Counseling & Management  Alcohol-Tobacco     Alcohol drinks/day: 0     Smoking Status: never  Caffeine-Diet-Exercise     Caffeine use/day: 0     Caffeine Counseling: not indicated; caffeine use is not excessive or problematic     Does Patient Exercise: no     Exercise Counseling: to improve exercise regimen  Current Medications (verified): 1)  Metformin Hcl 500 Mg Xr24h-Tab (Metformin Hcl) .Marland Kitchen.. 1 By Mouth Once Daily 2)  Nifedical Xl 60 Mg Xr24h-Tab (Nifedipine) .Marland Kitchen.. 1 By Mouth Once Daily 3)  P D Natal Vitamins/folic Acid  Tabs (Prenatal Multivit-Min-Fe-Fa) .... As Directed. 4)  Glimepiride 1 Mg Tabs (Glimepiride) .Marland Kitchen.. 1 By Mouth Daily. 5)  Vitamin D (Ergocalciferol) 50000 Unit Caps (Ergocalciferol) .Marland Kitchen.. 1 By Mouth Weekly 6)  Nexium 40 Mg Cpdr (Esomeprazole Magnesium) .Marland Kitchen.. 1 By Mouth Once Daily 7)  Sprintec 28 0.25-35 Mg-Mcg  Tabs (Norgestimate-Eth Estradiol) .Marland Kitchen.. 1 By Mouth Qd 8)  Vicodin Es 7.5-750 Mg Tabs (Hydrocodone-Acetaminophen) .Marland Kitchen.. 1 By Mouth Q6 Hours  Allergies (verified): No Known Drug Allergies  Past History:  Past medical, surgical, family and social histories (including risk factors) reviewed for relevance to current acute and chronic problems.  Past Medical History: Reviewed history from 05/21/2007 and no changes required. PCOS Hypertension Hyperlipidemia  Past Surgical History: Reviewed history from 09/18/2006 and no changes required. Breast reduction-07/2003  Family History: Reviewed history from 09/18/2006 and no changes required. Family History of Arthritis Family History Diabetes 1st degree relative Family History Hypertension FAm hx Stroke  Social History: Reviewed history from 06/13/2008 and no changes required. Single Never Smoked Alcohol use-no Drug use-no Regular exercise-no Occupation-- [employer] [place]  Review of Systems      See HPI  Physical Exam  General:  Well-developed,well-nourished,in no acute distress; alert,appropriate and cooperative throughout examination Head:  Normocephalic and atraumatic without obvious abnormalities. No apparent alopecia or balding. Eyes:  pupils equal, pupils round, pupils reactive to light, and no injection.   Ears:  External ear exam shows no significant lesions or deformities.  Otoscopic examination reveals clear canals, tympanic membranes are intact bilaterally without bulging, retraction, inflammation or discharge. Hearing is grossly normal bilaterally. Nose:  External nasal examination shows no deformity or inflammation. Nasal mucosa are pink and moist without lesions or exudates. Mouth:  Oral mucosa and oropharynx without lesions or exudates.  Teeth in good repair. Neck:  No deformities, masses, or tenderness noted. Lungs:  Normal respiratory effort, chest expands symmetrically. Lungs are clear to auscultation,  no crackles or wheezes. Heart:  normal rate and no murmur.   Extremities:  No clubbing, cyanosis, edema, or deformity noted with normal full range of motion of all joints.   Neurologic:  alert & oriented X3, cranial nerves II-XII intact, strength normal in all extremities, and gait normal.   Skin:  Intact without suspicious lesions or rashes Psych:  Cognition and judgment appear intact. Alert and cooperative with normal attention span and concentration. No apparent delusions, illusions, hallucinations   Impression & Recommendations:  Problem # 1:  HEADACHE (ICD-784.0)  Her updated medication list for this problem includes:    Vicodin Es 7.5-750 Mg Tabs (Hydrocodone-acetaminophen) .Marland Kitchen... 1 by mouth q6 hours  Orders: Venipuncture ([address]) Specimen Handling (60454) Radiology Referral (Radiology)  Headache diary reviewed.  Complete Medication List: 1)  Metformin Hcl 500 Mg Xr24h-tab (Metformin hcl) .Marland Kitchen.. 1 by mouth once daily 2)  Nifedical Xl 60 Mg Xr24h-tab (Nifedipine) .Marland Kitchen.. 1 by mouth once daily 3)  P D Natal Vitamins/folic Acid Tabs (Prenatal multivit-min-fe-fa) .... As directed. 4)  Glimepiride 1 Mg Tabs (Glimepiride) .Marland Kitchen.. 1 by mouth daily. 5)  Vitamin D (ergocalciferol) 50000 Unit Caps (Ergocalciferol) .Marland Kitchen.. 1 by mouth weekly 6)  Nexium 40 Mg Cpdr (Esomeprazole magnesium) .Marland Kitchen.. 1 by mouth once daily 7)  Sprintec 28 0.25-35 Mg-mcg Tabs (Norgestimate-eth estradiol) .Marland Kitchen.. 1 by mouth qd 8)  Vicodin Es 7.5-750 Mg Tabs (Hydrocodone-acetaminophen) .Marland Kitchen.. 1 by mouth q6 hours  Patient Instructions: 1)  Please schedule a follow-up appointment in 2 weeks.  Prescriptions: VICODIN ES 7.5-750 MG TABS (HYDROCODONE-ACETAMINOPHEN) 1 by mouth q6 hours  #30 x 0   Entered and Authorized by:   Loreen Freud DO   Signed by:   Loreen Freud DO on 12/08/2009   Method used:   Print then Give to Patient   RxID:   [id number] NIFEDICAL XL 60 MG XR24H-TAB (NIFEDIPINE) 1 by mouth once daily  #30 x 2    Entered and Authorized by:   Loreen Freud DO   Signed by:   Loreen Freud DO on 12/08/2009   Method used:   Electronically to        [facility] (retail)       [address]       [place], [place]  [address]       Ph: [phone]       Fax: [phone]   RxID:   [id number]   Appended Document: HEADACHE, NO FEVER///SPH     Allergies: No Known Drug Allergies   Impression & Recommendations:  Problem # 2:  HYPERTENSION (ICD-401.9)  Her updated medication list for this problem includes:    Nifedical Xl 60 Mg Xr24h-tab (Nifedipine) .Marland Kitchen... 1 by mouth once daily  Prior BP: 140/96 (12/08/2009)  Labs Reviewed: K+: 4.1 (12/08/2009) Creat: : 0.82 (12/08/2009)   Chol: 233 (08/02/2009)   HDL: 41.90 (08/02/2009)   TG: 80.0 (08/02/2009)  Complete Medication List: 1)  Metformin Hcl 500 Mg Xr24h-tab (Metformin hcl) .Marland Kitchen.. 1 by mouth once daily 2)  Nifedical Xl 60 Mg Xr24h-tab (Nifedipine) .Marland Kitchen.. 1 by mouth once daily 3)  P D Natal Vitamins/folic Acid Tabs (Prenatal multivit-min-fe-fa) .... As directed. 4)  Glimepiride 1 Mg Tabs (Glimepiride) .Marland Kitchen.. 1 by mouth daily. 5)  Vitamin D (ergocalciferol) 50000 Unit Caps (Ergocalciferol) .Marland Kitchen.. 1 by  mouth weekly 6)  Nexium 40 Mg Cpdr (Esomeprazole magnesium) .Marland Kitchen.. 1 by mouth once daily 7)  Sprintec 28 0.25-35 Mg-mcg Tabs (Norgestimate-eth estradiol) .Marland Kitchen.. 1 by mouth qd 8)  Vicodin Es 7.5-750 Mg Tabs (Hydrocodone-acetaminophen) .Marland Kitchen.. 1 by mouth q6 hours

## 2010-05-01 NOTE — Assessment & Plan Note (Signed)
Summary: 2 WEEK FOLLOWUP///SPH   Vital Signs:  Patient profile:   40 year old female Weight:      229.0 pounds Temp:     98.9 degrees F oral Pulse rate:   76 / minute Pulse rhythm:   regular BP sitting:   122 / 78  (left arm) Cuff size:   76large  Vitals Entered By: Almeta Monas CMA Duncan Dull) (January 01, 2010 4:12 PM) CC: 2 week f/u   History of Present Illness: Pt here for bp check only---feeling much better.  No complaints.  Current Medications (verified): 1)  Metformin Hcl 500 Mg Xr24h-Tab (Metformin Hcl) .Marland Kitchen.. 1 By Mouth Once Daily 2)  Nifedical Xl 60 Mg Xr24h-Tab (Nifedipine) .Marland Kitchen.. 1 By Mouth Once Daily 3)  P D Natal Vitamins/folic Acid  Tabs (Prenatal Multivit-Min-Fe-Fa) .... As Directed. 4)  Glimepiride 1 Mg Tabs (Glimepiride) .Marland Kitchen.. 1 By Mouth Daily. 5)  Vitamin D (Ergocalciferol) 50000 Unit Caps (Ergocalciferol) .Marland Kitchen.. 1 By Mouth Weekly 6)  Nexium 40 Mg Cpdr (Esomeprazole Magnesium) .Marland Kitchen.. 1 By Mouth Once Daily 7)  Sprintec 28 0.25-35 Mg-Mcg Tabs (Norgestimate-Eth Estradiol) .Marland Kitchen.. 1 By Mouth Qd 8)  Vicodin Es 7.5-750 Mg Tabs (Hydrocodone-Acetaminophen) .Marland Kitchen.. 1 By Mouth Q6 Hours  Allergies (verified): No Known Drug Allergies  Past History:  Past Medical History: Last updated: 05/21/2007 PCOS Hypertension Hyperlipidemia  Past Surgical History: Last updated: 09/18/2006 Breast reduction-07/2003  Family History: Last updated: 09/18/2006 Family History of Arthritis Family History Diabetes 1st degree relative Family History Hypertension FAm hx Stroke  Social History: Last updated: 06/13/2008 Single Never Smoked Alcohol use-no Drug use-no Regular exercise-no Occupation-- [employer] [place]  Risk Factors: Alcohol Use: 0 (12/08/2009) Caffeine Use: 0 (12/08/2009) Exercise: no (12/08/2009)  Risk Factors: Smoking Status: never (12/08/2009)  Family History: Reviewed history from 09/18/2006 and no changes required. Family History of Arthritis Family  History Diabetes 1st degree relative Family History Hypertension FAm hx Stroke  Social History: Reviewed history from 06/13/2008 and no changes required. Single Never Smoked Alcohol use-no Drug use-no Regular exercise-no Occupation-- [employer] [place]  Review of Systems      See HPI  Physical Exam  General:  Well-developed,well-nourished,in no acute distress; alert,appropriate and cooperative throughout examination Psych:  Cognition and judgment appear intact. Alert and cooperative with normal attention span and concentration. No apparent delusions, illusions, hallucinations   Impression & Recommendations:  Problem # 1:  HYPERTENSION (ICD-401.9) Assessment Improved  Her updated medication list for this problem includes:    Nifedical Xl 60 Mg Xr24h-tab (Nifedipine) .Marland Kitchen... 1 by mouth once daily  BP today: 122/78 Prior BP: 140/96 (12/08/2009)  Labs Reviewed: K+: 4.1 (12/08/2009) Creat: : 0.82 (12/08/2009)   Chol: 233 (08/02/2009)   HDL: 41.90 (08/02/2009)   TG: 80.0 (08/02/2009)  Complete Medication List: 1)  Metformin Hcl 500 Mg Xr24h-tab (Metformin hcl) .Marland Kitchen.. 1 by mouth once daily 2)  Nifedical Xl 60 Mg Xr24h-tab (Nifedipine) .Marland Kitchen.. 1 by mouth once daily 3)  P D Natal Vitamins/folic Acid Tabs (Prenatal multivit-min-fe-fa) .... As directed. 4)  Glimepiride 1 Mg Tabs (Glimepiride) .Marland Kitchen.. 1 by mouth daily. 5)  Vitamin D (ergocalciferol) 50000 Unit Caps (Ergocalciferol) .Marland Kitchen.. 1 by mouth weekly 6)  Nexium 40 Mg Cpdr (Esomeprazole magnesium) .Marland Kitchen.. 1 by mouth once daily 7)  Sprintec 28 0.25-35 Mg-mcg Tabs (Norgestimate-eth estradiol) .Marland Kitchen.. 1 by mouth qd 8)  Vicodin Es 7.5-750 Mg Tabs (Hydrocodone-acetaminophen) .Marland Kitchen.. 1 by mouth q6 hours  Other Orders: Admin 1st Vaccine (84132) Flu Vaccine 93yrs + (  [address]) Flu Vaccine Consent Questions     Do you have a history of severe allergic reactions to this vaccine? no    Any prior history of allergic reactions to egg and/or gelatin?  no    Do you have a sensitivity to the preservative Thimersol? no    Do you have a past history of Guillan-Barre Syndrome? no    Do you currently have an acute febrile illness? no    Have you ever had a severe reaction to latex? no    Vaccine information given and explained to patient? yes    Are you currently pregnant? no    Lot Number:[id number]   Exp Date:09/29/2010   Site Given  Left Deltoid IM Admin 1st Vaccine (60454) Flu Vaccine 42yrs + ([address]11)  Patient Instructions: 1)  Please schedule a follow-up appointment in 3 months .  .lbflu

## 2010-05-01 NOTE — Letter (Signed)
Summary: [facility]   Imported By: Lanelle Bal 01/19/2010 09:01:16  _____________________________________________________________________  External Attachment:    Type:   Image     Comment:   External Document

## 2010-05-01 NOTE — Consult Note (Signed)
Summary: [facility]  [facility]   Imported By: Lanelle Bal 11/16/2009 10:08:51  _____________________________________________________________________  External Attachment:    Type:   Image     Comment:   External Document

## 2010-05-14 ENCOUNTER — Encounter: Payer: Self-pay | Admitting: Family Medicine

## 2010-05-14 ENCOUNTER — Ambulatory Visit (INDEPENDENT_AMBULATORY_CARE_PROVIDER_SITE_OTHER): Payer: BC Managed Care – PPO | Admitting: Family Medicine

## 2010-05-14 DIAGNOSIS — J019 Acute sinusitis, unspecified: Secondary | ICD-10-CM

## 2010-05-16 ENCOUNTER — Telehealth: Payer: Self-pay | Admitting: Family Medicine

## 2010-05-23 NOTE — Progress Notes (Signed)
Summary: diarrhea,cough  Phone Note Refill Request Call back at Home Phone [phone] [facility]   Call back at [phone] Message from:  Patient  Refills Requested: Medication #1:  CEFTIN 500 MG TABS 1 by mouth two times a day. Pt states that med is too strong. Pt c/o diarrhea and increased cough. Pt would like to know what she can take for cough and diarrhea. Pt notes that she  has taken med with food and without food and diarrhea continues.Pt has been unable to got to work due to symptoms  Pls advise.Felecia Deloach CMA  May 16, 2010 10:34 AM    Follow-up for Phone Call        cheratussin 6 oz 1-2 tsp q6h as needed cough She can break dose in half and see if diarrhea stops---or we can change all together----biaxin xl pack #2 2 by mouth once daily for 14 days  Follow-up by: Loreen Freud DO,  May 16, 2010 12:11 PM    New/Updated Medications: BIAXIN XL PAC 500 MG XR24H-TAB (CLARITHROMYCIN) 2 by mouth once daily x14days CHERATUSSIN AC 100-10 MG/5ML SYRP (GUAIFENESIN-CODEINE) 1-2 tsp every 6 hours as needed cough. Prescriptions: CHERATUSSIN AC 100-10 MG/5ML SYRP (GUAIFENESIN-CODEINE) 1-2 tsp every 6 hours as needed cough.  #1 x 0   Entered by:   Almeta Monas CMA (AAMA)   Authorized by:   Loreen Freud DO   Signed by:   Almeta Monas CMA (AAMA) on 05/16/2010   Method used:   Printed then faxed to ...       [facility] (retail)       [address]       [place], [place]  [address]       Ph: [phone]       Fax: [phone]   RxID:   [id number] Esaw Dace PAC 500 MG XR24H-TAB (CLARITHROMYCIN) 2 by mouth once daily x14days  #28 x 0   Entered by:   Almeta Monas CMA (AAMA)   Authorized by:   Loreen Freud DO   Signed by:   Almeta Monas CMA (AAMA) on 05/16/2010   Method used:   Electronically to        [facility] (retail)       [address]       [place], [place]  [address]       Ph:  [phone]       Fax: [phone]   RxID:   [id number]

## 2010-05-23 NOTE — Assessment & Plan Note (Signed)
Summary: sinus infection   Vital Signs:  Patient profile:   40 year old female Height:      68 inches Weight:      243.4 pounds BMI:     37.14 Temp:     99.0 degrees F oral BP sitting:   120 / 88  (left arm) Cuff size:   large  Vitals Entered By: Almeta Monas CMA Duncan Dull) (May 14, 2010 3:06 PM) CC: per pt sinus infection x3days, URI symptoms   History of Present Illness:       This is a 40 year old woman who presents with URI symptoms.  The symptoms began 4 days ago.  The patient complains of nasal congestion, purulent nasal discharge, productive cough, earache, and sick contacts.  The patient denies fever, low-grade fever (<100.5 degrees), fever of 100.5-103 degrees, fever of 103.1-104 degrees, fever to >104 degrees, stiff neck, dyspnea, wheezing, rash, vomiting, diarrhea, use of an antipyretic, and response to antipyretic.  The patient also reports headache.  The patient denies the following risk factors for Strep sinusitis: unilateral facial pain, unilateral nasal discharge, poor response to decongestant, double sickening, tooth pain, Strep exposure, tender adenopathy, and absence of cough.    Current Medications (verified): 1)  Metformin Hcl 500 Mg Xr24h-Tab (Metformin Hcl) .Marland Kitchen.. 1 By Mouth Once Daily 2)  Nifedical Xl 60 Mg Xr24h-Tab (Nifedipine) .Marland Kitchen.. 1 By Mouth Once Daily 3)  P D Natal Vitamins/folic Acid  Tabs (Prenatal Multivit-Min-Fe-Fa) .... As Directed. 4)  Glimepiride 1 Mg Tabs (Glimepiride) .Marland Kitchen.. 1 By Mouth Daily. 5)  Vitamin D (Ergocalciferol) 50000 Unit Caps (Ergocalciferol) .Marland Kitchen.. 1 By Mouth Weekly 6)  Nexium 40 Mg Cpdr (Esomeprazole Magnesium) .Marland Kitchen.. 1 By Mouth Once Daily 7)  Vicodin Es 7.5-750 Mg Tabs (Hydrocodone-Acetaminophen) .Marland Kitchen.. 1 By Mouth Q6 Hours 8)  Astepro 0.15 % Soln (Azelastine Hcl) .... 2 Sprays Each Nostril Once Daily 9)  Flonase 50 Mcg/act Susp (Fluticasone Propionate) .... 2 Sprays Each Nostril Once Daily 10)  Ceftin 500 Mg Tabs (Cefuroxime Axetil) .Marland Kitchen.. 1  By Mouth Two Times A Day  Allergies (verified): No Known Drug Allergies  Past History:  Past Medical History: Last updated: 05/21/2007 PCOS Hypertension Hyperlipidemia  Past Surgical History: Last updated: 09/18/2006 Breast reduction-07/2003  Family History: Last updated: 09/18/2006 Family History of Arthritis Family History Diabetes 1st degree relative Family History Hypertension FAm hx Stroke  Social History: Last updated: 06/13/2008 Single Never Smoked Alcohol use-no Drug use-no Regular exercise-no Occupation-- [employer] [place]  Risk Factors: Alcohol Use: 0 (12/08/2009) Caffeine Use: 0 (12/08/2009) Exercise: no (12/08/2009)  Risk Factors: Smoking Status: never (12/08/2009)  Family History: Reviewed history from 09/18/2006 and no changes required. Family History of Arthritis Family History Diabetes 1st degree relative Family History Hypertension FAm hx Stroke  Social History: Reviewed history from 06/13/2008 and no changes required. Single Never Smoked Alcohol use-no Drug use-no Regular exercise-no Occupation-- [employer] [place]  Review of Systems      See HPI  Physical Exam  General:  Well-developed,well-nourished,in no acute distress; alert,appropriate and cooperative throughout examination Ears:  External ear exam shows no significant lesions or deformities.  Otoscopic examination reveals clear canals, tympanic membranes are intact bilaterally without bulging, retraction, inflammation or discharge. Hearing is grossly normal bilaterally. Nose:  no external deformity, L frontal sinus tenderness, L maxillary sinus tenderness, R frontal sinus tenderness, and R maxillary sinus tenderness.   Mouth:  Oral mucosa and oropharynx without lesions or exudates.  Teeth in good repair. Neck:  No deformities, masses, or tenderness noted. Lungs:  Normal respiratory effort, chest expands symmetrically. Lungs are clear to auscultation, no  crackles or wheezes. Heart:  normal rate and no murmur.   Extremities:  No clubbing, cyanosis, edema, or deformity noted with normal full range of motion of all joints.   Psych:  Oriented X3 and normally interactive.     Impression & Recommendations:  Problem # 1:  SINUSITIS- ACUTE-NOS (ICD-461.9)  Her updated medication list for this problem includes:    Astepro 0.15 % Soln (Azelastine hcl) .Marland Kitchen... 2 sprays each nostril once daily    Flonase 50 Mcg/act Susp (Fluticasone propionate) .Marland Kitchen... 2 sprays each nostril once daily    Ceftin 500 Mg Tabs (Cefuroxime axetil) .Marland Kitchen... 1 by mouth two times a day  Instructed on treatment. Call if symptoms persist or worsen.   Complete Medication List: 1)  Metformin Hcl 500 Mg Xr24h-tab (Metformin hcl) .Marland Kitchen.. 1 by mouth once daily 2)  Nifedical Xl 60 Mg Xr24h-tab (Nifedipine) .Marland Kitchen.. 1 by mouth once daily 3)  P D Natal Vitamins/folic Acid Tabs (Prenatal multivit-min-fe-fa) .... As directed. 4)  Glimepiride 1 Mg Tabs (Glimepiride) .Marland Kitchen.. 1 by mouth daily. 5)  Vitamin D (ergocalciferol) 50000 Unit Caps (Ergocalciferol) .Marland Kitchen.. 1 by mouth weekly 6)  Nexium 40 Mg Cpdr (Esomeprazole magnesium) .Marland Kitchen.. 1 by mouth once daily 7)  Vicodin Es 7.5-750 Mg Tabs (Hydrocodone-acetaminophen) .Marland Kitchen.. 1 by mouth q6 hours 8)  Astepro 0.15 % Soln (Azelastine hcl) .... 2 sprays each nostril once daily 9)  Flonase 50 Mcg/act Susp (Fluticasone propionate) .... 2 sprays each nostril once daily 10)  Ceftin 500 Mg Tabs (Cefuroxime axetil) .Marland Kitchen.. 1 by mouth two times a day Prescriptions: CEFTIN 500 MG TABS (CEFUROXIME AXETIL) 1 by mouth two times a day  #20 x 0   Entered and Authorized by:   Loreen Freud DO   Signed by:   Loreen Freud DO on 05/14/2010   Method used:   Electronically to        [facility] (retail)       [address]       [place], [place]  [address]       Ph: [phone]       Fax: [phone]   RxID:   [id number] FLONASE 50 MCG/ACT  SUSP (FLUTICASONE PROPIONATE) 2 sprays each nostril once daily  #1 x 2   Entered and Authorized by:   Loreen Freud DO   Signed by:   Loreen Freud DO on 05/14/2010   Method used:   Electronically to        [facility] (retail)       [address]       [place], [place]  [address]       Ph: [phone]       Fax: [phone]   RxID:   [id number]    Orders Added: 1)  Est. Patient Level III [16010]

## 2010-06-12 LAB — CBC
HCT: 40.9 % (ref 36.0–46.0)
Hemoglobin: 13.9 g/dL (ref 12.0–15.0)
MCH: 30.2 pg (ref 26.0–34.0)
MCHC: 34 g/dL (ref 30.0–36.0)
MCV: 88.9 fL (ref 78.0–100.0)
RBC: 4.6 MIL/uL (ref 3.87–5.11)

## 2010-06-12 LAB — POCT I-STAT, CHEM 8
Calcium, Ion: 1.21 mmol/L (ref 1.12–1.32)
Creatinine, Ser: 0.8 mg/dL (ref 0.4–1.2)
Glucose, Bld: 93 mg/dL (ref 70–99)
HCT: 43 % (ref 36.0–46.0)
Hemoglobin: 14.6 g/dL (ref 12.0–15.0)

## 2010-06-12 LAB — DIFFERENTIAL
Basophils Relative: 0 % (ref 0–1)
Eosinophils Absolute: 0.2 10*3/uL (ref 0.0–0.7)
Eosinophils Relative: 2 % (ref 0–5)
Lymphs Abs: 3.5 10*3/uL (ref 0.7–4.0)
Monocytes Absolute: 0.4 10*3/uL (ref 0.1–1.0)
Monocytes Relative: 5 % (ref 3–12)

## 2010-06-12 LAB — POCT CARDIAC MARKERS
CKMB, poc: <1 ng/mL — ABNORMAL LOW (ref 1.0–8.0)
Troponin i, poc: <0.05 ng/mL (ref 0.00–0.09)

## 2010-07-17 LAB — POCT CARDIAC MARKERS
CKMB, poc: <1 ng/mL — ABNORMAL LOW (ref 1.0–8.0)
Myoglobin, poc: 48.7 ng/mL (ref 12–200)
Myoglobin, poc: 60.8 ng/mL (ref 12–200)

## 2010-07-17 LAB — POCT I-STAT, CHEM 8
BUN: 11 mg/dL (ref 6–23)
Creatinine, Ser: 0.8 mg/dL (ref 0.4–1.2)
Glucose, Bld: 170 mg/dL — ABNORMAL HIGH (ref 70–99)
Hemoglobin: 15 g/dL (ref 12.0–15.0)
Potassium: 3.6 mEq/L (ref 3.5–5.1)
Sodium: 138 mEq/L (ref 135–145)

## 2010-07-17 LAB — POCT PREGNANCY, URINE: Preg Test, Ur: NEGATIVE

## 2010-07-17 LAB — GLUCOSE, CAPILLARY

## 2010-07-25 ENCOUNTER — Ambulatory Visit (INDEPENDENT_AMBULATORY_CARE_PROVIDER_SITE_OTHER): Payer: BC Managed Care – PPO | Admitting: Family Medicine

## 2010-07-25 DIAGNOSIS — Z111 Encounter for screening for respiratory tuberculosis: Secondary | ICD-10-CM

## 2010-07-27 ENCOUNTER — Encounter: Payer: Self-pay | Admitting: *Deleted

## 2010-07-27 LAB — TB SKIN TEST
Induration: 20
TB Skin Test: POSITIVE mm

## 2010-07-30 NOTE — Progress Notes (Signed)
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  Pt is also pregnant (did not notify office of this until after PPD was placed).  Pt underwent fertility tx's to get pregnant and is seeing fertility doctor for pregnancy.  Called and left message w/ assistant on Friday- never got call back.  According to UTD article- pt's who are pregnant should not be screened unless symptomatic (pt is not).  Due to pregnancy will not get CXR or start INH- will need f/u after delivery.

## 2010-07-31 ENCOUNTER — Telehealth: Payer: Self-pay | Admitting: *Deleted

## 2010-07-31 NOTE — Telephone Encounter (Signed)
i read PPD and can provide note that pt is asymptomatic and test was placed as a screen but due to her current pregnant state we cannot proceed w/ treatment at this time.

## 2010-07-31 NOTE — Telephone Encounter (Signed)
Pt left VM that she had a positive PPD reading but is currently pregnant. Pt need letter from provider stating that it is ok to still work around clients and staff. Pt is currently employed as psychotherapist..Please advise.

## 2010-07-31 NOTE — Telephone Encounter (Signed)
Who read PPD---I would need to see her to document she is not sick before I could do that.--- Dr who read + ppd may be willing to write note

## 2010-08-01 ENCOUNTER — Encounter: Payer: Self-pay | Admitting: Family Medicine

## 2010-08-01 NOTE — Telephone Encounter (Signed)
Mssg left advising Letter ready for pick up     KP

## 2010-08-15 ENCOUNTER — Other Ambulatory Visit: Payer: Self-pay | Admitting: Obstetrics and Gynecology

## 2010-08-17 NOTE — Op Note (Signed)
NAME:  Kathleen Jordan, Kathleen Jordan                     ACCOUNT NO.:  [id number]   MEDICAL RECORD NO.:  [id number]                   PATIENT TYPE:  AMB   LOCATION:  DSC                                  FACILITY:  [facility]   PHYSICIAN:  Earvin Hansen L. Shon Hough, M.D.           DATE OF BIRTH:  13-Jul-1970   DATE OF PROCEDURE:  07/18/2003  DATE OF DISCHARGE:                                 OPERATIVE REPORT   INDICATIONS FOR PROCEDURE:  A 40 year old lady with severe macromastia, back  and shoulder pain secondary to large pendulous breasts, increased back and  shoulder pain, intertriginous changes, that is recalcitrant to conservative  treatment, special bras, ointments, etc.   PROCEDURE:  Bilateral breast reductions using the inferior pedicle  technique.   ANESTHESIA:  General.   SURGEON:  Gerald L. Shon Hough, M.D.   DESCRIPTION OF PROCEDURE:  The patient was set up and drawn for the inferior  pedicle reduction mammoplasty remarking the nipple areolar complexes from  over 38 to 40 cm to 22 cm, left side greater than the right.  She then  underwent general anesthesia intubated orally.  Prep was done to the chest  and breast areas in routine fashion using Betadine soap and solution and  walled off with sterile towels and drapes so as to make a sterile field.  0.25% Xylocaine with epinephrine was injected locally, 150 mL per side,  1:400,000 concentration.  The wounds were scored with #15 blades and then  the skin over the inferior pedicle was deepithelialized with a #20 blade.  Medial and lateral fatty dermal pedicles were excised down to underlying  fascia.  Out laterally more tissue was removed for asymmetry and some  accessory breast tissue was removed with liposuction assistance.  The new  keyhole was debulked and after proper hemostasis, the flaps were transferred  and stayed with 3-0 Prolene.  Subcutaneous closure was done with 3-0  Monocryl x2 layers and then a running subcuticular stitch of 3-0  Monocryl  and 5-0 Monocryl throughout the inverted T.  The wounds were drained with  #10 fully fluted drains, Blake type which were placed in the depths of the  wound and brought out through the lateral most portion of the incision and  secured with 3-0 Prolene.  The wounds were cleansed.  Half inch Steri-Strips  and sterile dressings were applied including Xeroform, 4x4's, ABD's, and  Hypafix tape.  She withstood the procedures very well.  Estimated blood loss  less than 200 mL.  Complications were none.  At the end of the procedure,  nipple areolar complexes were examined with good suppleness and blood  supply.                                               Yaakov Guthrie. Shon Hough, M.D.    GLT/MEDQ  D:  07/18/2003  T:  07/19/2003  Job:  [id number]

## 2010-08-17 NOTE — Procedures (Signed)
NAME:  Kathleen Jordan, Kathleen Jordan NO.:  [id number]   MEDICAL RECORD NO.:  [id number]          PATIENT TYPE:  OUT   LOCATION:  SLEEP CENTER                 FACILITY:  [facility]   PHYSICIAN:  Marcelyn Bruins, M.D. [facility] DATE OF BIRTH:  1971-03-07   DATE OF STUDY:  08/03/2005                              NOCTURNAL POLYSOMNOGRAM    REFERRING PHYSICIAN:  Dr. Loreen Freud   DATE OF STUDY:  Aug 03, 2005   INDICATION FOR STUDY:  Hypersomnia with sleep apnea.   EPWORTH SLEEPINESS SCORE:  11.   SLEEP ARCHITECTURE:  The patient had a total sleep time of only 252 minutes  with very little slow wave sleep or REM.  Sleep onset latency was prolonged  at 57 minutes and REM onset was fairly rapid at 54 minutes.  Sleep  efficiency was very decreased at 62%.   RESPIRATORY DATA:  The patient was found to have 169 hypopneas and 29 apneas  for a respiratory disturbance index of 46 events per hour.  The events were  not positional but there was moderate snoring noted throughout.  Patient did  not meet split night protocols secondary to not establishing consistent  sleep until well after midnight.   OXYGEN DATA:  The patient had O2 desaturation as low as 89% with her events.   CARDIAC DATA:  No clinically significant cardiac arrhythmias.   MOVEMENT-PARASOMNIA:  There were no clinically significant movements during  the night.   IMPRESSIONS-RECOMMENDATIONS:  Severe obstructive sleep apnea/hypopnea  syndrome with a respiratory disturbance index of 46 events per hour and O2  desaturation as low as 89%.  Treatment for this degree of sleep apnea should  focus primarily on weight loss as well as CPAP.  Split night study was not  done secondary to the patient not establishing consistent sleep until well  after midnight.          ______________________________  Marcelyn Bruins, M.D. [facility]  Diplomate, American Board of Sleep  Medicine    KC/MEDQ  D:  08/22/2005 14:23:19  T:  08/23/2005 07:35:56  Job:   [id number]

## 2010-08-17 NOTE — Assessment & Plan Note (Signed)
[facility]                               PULMONARY OFFICE NOTE   Kathleen Jordan, Kathleen Jordan                  MRN:          [id number]  DATE:10/14/2005                            DOB:          04-May-1970    Consultation.   HISTORY OF PRESENT ILLNESS:  The patient is a 40 year old female who I have  been asked to see for obstructive sleep apnea.  The patient recently  underwent polysomnography where she was found to have severe obstructive  sleep apnea with a respiratory disturbance index of 46 events per hour and a  saturation as low as 89%.  Patient states that she has been told she has  loud snoring and pauses in her breathing during sleep.  She also notes  occasional choking arousals.  She will typically get to bed between 11 and 1  a.m. and get up at 8 a.m. to start to her day.  She is typically not rested  whenever she arises.  Patient works as a Pharmacist, hospital and has  noticed decreased focus and also inappropriate day time sleepiness.  She  will dose in meetings and will occasionally have sleepiness with long  distance driving.  Of note her weight is up about 20 to 25 pounds over the  last few years.   PAST MEDICAL HISTORY:  1.  Hypertension.  2.  Dyslipidemia.  3.  Allergic rhinitis.  4.  History of breast reduction in 2005.   CURRENT MEDICATIONS:  1.  Toprol 100 mg q. day.  2.  Lotrel of unknown dose q. day.  3.  Metformin 500 mg q. day.   ALLERGIES:  The patient has no known drug allergies.   SOCIAL HISTORY:  She is single and cares for a foster child.  She has never  smoked and her history is remarkable for her sister having asthma and  allergies, otherwise it is noncontributory.   REVIEW OF SYSTEMS:  As per history of present illness.  Also see patient  intake form documented on the chart.   PHYSICAL EXAMINATION:  GENERAL:  She is an obese female in no acute  distress.  VITAL SIGNS:  Blood pressure 113/84.  Pulse  76, temperature is 98.5. Weight  is 222 lbs.  O2 saturation on room air is 97%.  HEENT:  Pupils are equal, round and reactive to light and accommodation.  Extraocular muscles are intact.  Nares shows turbinate hypertrophy and  oropharynx shows elongation soft palate and uvula with sidewall narrowing.  NECK:  Supple without JVD or lymphadenopathy.  There is no palpable  thyromegaly.  CHEST:  Totally clear.  CARDIAC:  Exam reveals regular rate and rhythm without murmurs, rubs or  gallops.  ABDOMEN:  Soft, non-tender with good bowel sounds.  GENITALIA, RECTAL, BREAST EXAM:  Not done and not indicated.  EXTREMITIES:  Lower extremities are without edema, good pulses distally and  no calf tenderness.  NEUROLOGIC:  Alert and oriented with no gross motor deficit.   IMPRESSION:  Severe obstructive sleep apnea documented on nocturnal  polysomnography.  The patient clearly is obese and has abnormal  upper airway  anatomy.  I had a long discussion with her about the various reasons of  sleep apnea and her treatment options.  Clearly CPAP and weight loss are her  two optimal therapies.  The patient is agreeable to this.   PLAN:  1.  Initiate CPAP starting at 10 cm.  She will ultimately need pressure      optimization.  2.  Work on weight loss.  3.  She will followup in four weeks or sooner if there are problems.                                   Barbaraann Share, MD, Tonny Bollman   [facility]/MedQ  DD:  10/31/2005  DT:  10/31/2005  Job #:  [id number]   cc:   Loreen Freud, MD

## 2010-08-24 ENCOUNTER — Ambulatory Visit (HOSPITAL_COMMUNITY)
Admission: RE | Admit: 2010-08-24 | Discharge: 2010-08-24 | Disposition: A | Discharge status: Home / Self Care | Payer: BC Managed Care – PPO | Source: Ambulatory Visit | Attending: Obstetrics and Gynecology | Admitting: Obstetrics and Gynecology

## 2010-08-24 ENCOUNTER — Other Ambulatory Visit (HOSPITAL_COMMUNITY): Payer: Self-pay | Admitting: Obstetrics and Gynecology

## 2010-08-24 ENCOUNTER — Encounter (HOSPITAL_COMMUNITY): Payer: Self-pay

## 2010-08-24 DIAGNOSIS — O021 Missed abortion: Secondary | ICD-10-CM

## 2010-08-24 DIAGNOSIS — R7611 Nonspecific reaction to tuberculin skin test without active tuberculosis: Secondary | ICD-10-CM

## 2010-08-24 DIAGNOSIS — O24919 Unspecified diabetes mellitus in pregnancy, unspecified trimester: Secondary | ICD-10-CM | POA: Insufficient documentation

## 2010-08-24 DIAGNOSIS — O09529 Supervision of elderly multigravida, unspecified trimester: Secondary | ICD-10-CM | POA: Insufficient documentation

## 2010-08-24 DIAGNOSIS — O30009 Twin pregnancy, unspecified number of placenta and unspecified number of amniotic sacs, unspecified trimester: Secondary | ICD-10-CM | POA: Insufficient documentation

## 2010-08-24 DIAGNOSIS — O209 Hemorrhage in early pregnancy, unspecified: Secondary | ICD-10-CM | POA: Insufficient documentation

## 2010-08-24 DIAGNOSIS — O10019 Pre-existing essential hypertension complicating pregnancy, unspecified trimester: Secondary | ICD-10-CM | POA: Insufficient documentation

## 2010-08-28 ENCOUNTER — Ambulatory Visit (HOSPITAL_COMMUNITY)
Admission: RE | Admit: 2010-08-28 | Payer: BC Managed Care – PPO | Source: Ambulatory Visit | Admitting: Obstetrics and Gynecology

## 2010-09-15 ENCOUNTER — Encounter: Payer: Self-pay | Admitting: Family Medicine

## 2010-09-24 ENCOUNTER — Other Ambulatory Visit: Payer: Self-pay | Admitting: Family Medicine

## 2010-09-27 ENCOUNTER — Encounter: Payer: BC Managed Care – PPO | Admitting: Family Medicine

## 2010-09-27 NOTE — Progress Notes (Signed)
  Subjective:    Patient ID: Kathleen Jordan, female    DOB: 06/07/1970, 40 y.o.   MRN: [id number]  HPI   No show Review of Systems     Objective:   Physical Exam        Assessment & Plan:   This encounter was created in error - please disregard.

## 2010-10-27 ENCOUNTER — Other Ambulatory Visit: Payer: Self-pay | Admitting: Family Medicine

## 2010-11-16 HISTORY — PX: OTHER SURGICAL HISTORY: SHX169

## 2010-11-20 ENCOUNTER — Encounter: Payer: Self-pay | Admitting: Family Medicine

## 2010-11-20 ENCOUNTER — Ambulatory Visit: Payer: BC Managed Care – PPO | Admitting: Family Medicine

## 2010-11-20 DIAGNOSIS — I10 Essential (primary) hypertension: Secondary | ICD-10-CM

## 2010-11-20 DIAGNOSIS — G4730 Sleep apnea, unspecified: Secondary | ICD-10-CM

## 2010-11-20 DIAGNOSIS — K219 Gastro-esophageal reflux disease without esophagitis: Secondary | ICD-10-CM

## 2010-11-20 DIAGNOSIS — E119 Type 2 diabetes mellitus without complications: Secondary | ICD-10-CM

## 2010-11-20 DIAGNOSIS — E785 Hyperlipidemia, unspecified: Secondary | ICD-10-CM

## 2010-11-20 DIAGNOSIS — Z0000 Encounter for general adult medical examination without abnormal findings: Secondary | ICD-10-CM

## 2010-11-20 LAB — CBC WITH DIFFERENTIAL/PLATELET
Basophils Absolute: 0 10*3/uL (ref 0.0–0.1)
Eosinophils Absolute: 0.2 10*3/uL (ref 0.0–0.7)
Lymphocytes Relative: 32.2 % (ref 12.0–46.0)
Lymphs Abs: 2.8 10*3/uL (ref 0.7–4.0)
Monocytes Relative: 6.3 % (ref 3.0–12.0)
Platelets: 317 10*3/uL (ref 150.0–400.0)
RDW: 12.8 % (ref 11.5–14.6)

## 2010-11-20 LAB — POCT URINALYSIS DIPSTICK
Bilirubin, UA: NEGATIVE
Leukocytes, UA: NEGATIVE
Nitrite, UA: NEGATIVE
Protein, UA: 30
Urobilinogen, UA: 0.2
pH, UA: 5

## 2010-11-20 LAB — HEPATIC FUNCTION PANEL
Albumin: 4.5 g/dL (ref 3.5–5.2)
Alkaline Phosphatase: 68 U/L (ref 39–117)

## 2010-11-20 LAB — LIPID PANEL
Cholesterol: 205 mg/dL — ABNORMAL HIGH (ref 0–200)
Total CHOL/HDL Ratio: 4

## 2010-11-20 LAB — MICROALBUMIN / CREATININE URINE RATIO: Microalb Creat Ratio: 3 mg/g (ref 0.0–30.0)

## 2010-11-20 LAB — BASIC METABOLIC PANEL
BUN: 11 mg/dL (ref 6–23)
Calcium: 9.4 mg/dL (ref 8.4–10.5)
GFR: 111.27 mL/min (ref >60.00)
Glucose, Bld: 137 mg/dL — ABNORMAL HIGH (ref 70–99)

## 2010-11-20 LAB — HEMOGLOBIN A1C: Hgb A1c MFr Bld: 7.7 % — ABNORMAL HIGH (ref 4.6–6.5)

## 2010-11-20 MED ORDER — ESOMEPRAZOLE MAGNESIUM 40 MG PO CPDR: 40.0000 mg | DELAYED_RELEASE_CAPSULE | Freq: Every day | ORAL | Status: DC

## 2010-11-20 NOTE — Progress Notes (Signed)
  Subjective:     Kathleen Jordan is a 40 y.o. female and is here for a comprehensive physical exam. The patient reports no problems---she had invitro fertilization last Friday.Marland Kitchen  History   Social History  . Marital Status: Married    Spouse Name: N/A    Number of Children: N/A  . Years of Education: N/A   Occupational History  . THERAPIST     [employer]   Social History Main Topics  . Smoking status: Never Smoker   . Smokeless tobacco: Not on file  . Alcohol Use: No  . Drug Use: No  . Sexually Active: Yes -- Female partner(s)   Other Topics Concern  . Not on file   Social History Narrative  . No narrative on file   Health Maintenance  Topic Date Due  . Tetanus/tdap  04/01/2010  . Pap Smear  12/20/2012    The following portions of the patient's history were reviewed and updated as appropriate: allergies, current medications, past family history, past medical history, past social history, past surgical history and problem list.  Review of Systems Review of Systems  Constitutional: Negative for activity change, appetite change and fatigue.  HENT: Negative for hearing loss, congestion, tinnitus and ear discharge.  dentist -- due--last visit, early last year Eyes: Negative for visual disturbance (see optho q1y -- vision corrected to 20/20 with glasses).  Respiratory: Negative for cough, chest tightness and shortness of breath.   Cardiovascular: Negative for chest pain, palpitations and leg swelling.  Gastrointestinal: Negative for abdominal pain, diarrhea, constipation and abdominal distention.  Genitourinary: Negative for urgency, frequency, decreased urine volume and difficulty urinating.  Musculoskeletal: Negative for back pain, arthralgias and gait problem.  Skin: Negative for color change, pallor and rash.  Neurological: Negative for dizziness, light-headedness, numbness and headaches.  Hematological: Negative for adenopathy. Does not  bruise/bleed easily.  Psychiatric/Behavioral: Negative for suicidal ideas, confusion, sleep disturbance, self-injury, dysphoric mood, decreased concentration and agitation.       Objective:    BP 114/72  Pulse 71  Temp(Src) 99.2 F (37.3 C) (Oral)  Ht 5\' 8"  (1.727 m)  Wt 241 lb 3.2 oz (109.408 kg)  BMI 36.67 kg/m2  SpO2 98% General appearance: alert, cooperative, appears stated age, no distress and morbidly obese Head: Normocephalic, without obvious abnormality, atraumatic Eyes: conjunctivae/corneas clear. PERRL, EOM's intact. Fundi benign. Ears: normal TM's and external ear canals both ears Nose: Nares normal. Septum midline. Mucosa normal. No drainage or sinus tenderness. Throat: lips, mucosa, and tongue normal; teeth and gums normal Neck: no adenopathy, no carotid bruit, no JVD, supple, symmetrical, trachea midline and thyroid not enlarged, symmetric, no tenderness/mass/nodules Back: symmetric, no curvature. ROM normal. No CVA tenderness. Lungs: clear to auscultation bilaterally Breasts: gyn Heart: regular rate and rhythm, S1, S2 normal, no murmur, click, rub or gallop Abdomen: soft, non-tender; bowel sounds normal; no masses,  no organomegaly Pelvic: gyn Extremities: extremities normal, atraumatic, no cyanosis or edema Pulses: 2+ and symmetric Skin: Skin color, texture, turgor normal. No rashes or lesions Lymph nodes: Cervical, supraclavicular, and axillary nodes normal. Neurologic: Alert and oriented X 3, normal strength and tone. Normal symmetric reflexes. Normal coordination and gait psych--no depression/ anxiety    Assessment:    Healthy female exam.     DM--per endo  PCOS-- per endo and gyn  Hyperlipidemia  HTN Plan:    check fasting labs  ghm utd Cont' meds See After Visit Summary for Counseling Recommendations

## 2010-11-20 NOTE — Patient Instructions (Signed)

## 2010-11-21 ENCOUNTER — Encounter: Payer: Self-pay | Admitting: Family Medicine

## 2010-11-23 ENCOUNTER — Telehealth: Payer: Self-pay

## 2010-11-23 ENCOUNTER — Encounter: Payer: Self-pay | Admitting: Family Medicine

## 2010-11-23 DIAGNOSIS — E119 Type 2 diabetes mellitus without complications: Secondary | ICD-10-CM

## 2010-11-23 DIAGNOSIS — Z794 Long term (current) use of insulin: Secondary | ICD-10-CM

## 2010-11-23 DIAGNOSIS — IMO0001 Reserved for inherently not codable concepts without codable children: Secondary | ICD-10-CM | POA: Insufficient documentation

## 2010-11-23 NOTE — Telephone Encounter (Signed)
Message copied by Arnette Norris on Fri Nov 23, 2010  1:20 PM ------      Message from: Lelon Perla      Created: Fri Nov 23, 2010 12:09 PM       Send labs to endo---[facility]      Cholesterol----  Cholesterol--- LDL goal < 70,  HDL >40,  TG < 150.  Diet and exercise will increase HDL and decrease LDL and TG.  Fish,  Fish Oil, Flaxseed oil will also help increase the HDL and decrease Triglycerides.   Recheck labs in 3 months.   Start welcol--- (she can choose between powder, 1 packet daily and 3 tabs bid). ---she can try samples.  This will help cholesterol and dm.   272.4  Lipid, hep 250.00 hgba1c, bmp

## 2010-11-23 NOTE — Telephone Encounter (Signed)
mssg left on VM     KP 

## 2010-11-27 NOTE — Telephone Encounter (Signed)
msgs left on VM for a return call    KP

## 2010-11-30 MED ORDER — COLESEVELAM HCL 625 MG PO TABS: 1875.0000 mg | ORAL_TABLET | Freq: Two times a day (BID) | ORAL | Status: DC

## 2010-11-30 NOTE — Telephone Encounter (Signed)
Discussed labs with patient and she stated she would rather have the tablets.Marland KitchenMarland KitchenMarland KitchenWill forward labs to Dr.Jones at [facility]--copy mailed to patient       KP

## 2010-12-01 ENCOUNTER — Other Ambulatory Visit: Payer: Self-pay | Admitting: Family Medicine

## 2010-12-06 ENCOUNTER — Telehealth: Payer: Self-pay | Admitting: *Deleted

## 2010-12-06 ENCOUNTER — Ambulatory Visit: Payer: BC Managed Care – PPO | Admitting: Pulmonary Disease

## 2010-12-06 ENCOUNTER — Encounter: Payer: Self-pay | Admitting: Pulmonary Disease

## 2010-12-06 VITALS — BP 136/84 | HR 78 | Temp 98.2°F | Ht 68.0 in | Wt 243.0 lb

## 2010-12-06 DIAGNOSIS — G4733 Obstructive sleep apnea (adult) (pediatric): Secondary | ICD-10-CM

## 2010-12-06 MED ORDER — NIFEDIPINE ER OSMOTIC RELEASE 60 MG PO TB24: 60.0000 mg | ORAL_TABLET | Freq: Every day | ORAL | Status: DC

## 2010-12-06 NOTE — Telephone Encounter (Signed)
Pt c/o loss of appetite, nausea, and diarrhea since starting welchol.

## 2010-12-06 NOTE — Assessment & Plan Note (Signed)
The patient has a history of severe sleep apnea, however only wore CPAP for a very short period of time because of mask issues.  She never followed up with me, and never tried to work on trouble shooting.  She is having worsening symptoms, and her weight has increased by at least 20 pounds from her last sleep study.  At this point, I would like to get her back on CPAP, and work on desensitization.  The patient is agreeable.  I have also encouraged her to work aggressively on weight loss.

## 2010-12-06 NOTE — Telephone Encounter (Signed)
welchol normally causes constipation---not diarrhea It can cause nausea.  Pt can drop to 2 tab bid for a while and see if that helps----or we can refer to lipid clinic

## 2010-12-06 NOTE — Progress Notes (Signed)
  Subjective:    Patient ID: Kathleen Jordan, female    DOB: 1971/03/25, 40 y.o.   MRN: [id number]  HPI The patient is a 40 year old female who I've been asked to see for management of obstructive sleep apnea.  The patient was diagnosed with severe sleep apnea in 2007, and was treated with CPAP for a very short period of time.  She felt the mask was uncomfortable, and never followed up.  She has not been on CPAP since that time.  She recently underwent a GYN procedure in May of this year, and was noted to have witnessed apneas.  She has loud snoring, as well as choking arousals during the night.  She has frequent awakenings and nonrestorative sleep.  She has significant sleep pressure during the day, especially during her sessions at work.  She denies any sleepiness issues with driving.  The patient states that her weight is up 20-25 pounds over the last 2 years.  Her epworth score today is 9  Sleep Questionnaire: What time do you typically go to bed?( Between what hours) 11 pm How long does it take you to fall asleep? 2 hours How many times during the night do you wake up? 5 What time do you get out of bed to start your day? 0730 Do you drive or operate heavy machinery in your occupation? No How much has your weight changed (up or down) over the past two years? (In pounds) 25 lb (11.34 kg) Have you ever had a sleep study before? Yes If yes, location of study? [facility] If yes, date of study? 6 to 7 years ago Do you currently use CPAP? No Do you wear oxygen at any time? No    Review of Systems  Constitutional: Positive for appetite change. Negative for fever and unexpected weight change.  HENT: Positive for ear pain, congestion and sneezing. Negative for nosebleeds, sore throat, rhinorrhea, trouble swallowing, dental problem, postnasal drip and sinus pressure.   Eyes: Negative for redness and itching.  Respiratory: Positive for cough and shortness of breath. Negative for chest tightness and wheezing.    Cardiovascular: Positive for chest pain. Negative for palpitations and leg swelling.  Gastrointestinal: Positive for abdominal pain. Negative for nausea and vomiting.  Genitourinary: Negative for dysuria.  Musculoskeletal: Positive for joint swelling.  Skin: Negative for rash.  Neurological: Positive for headaches.  Hematological: Does not bruise/bleed easily.  Psychiatric/Behavioral: Negative for dysphoric mood. The patient is not nervous/anxious.        Objective:   Physical Exam Constitutional:  Obese female, no acute distress  HENT:  Nares patent without discharge, but large turbs  Oropharynx without exudate, palate and uvula are mildly elongated.  Narrowed posterior pharyngeal space  Eyes:  Perrla, eomi, no scleral icterus  Neck:  No JVD, no TMG  Cardiovascular:  Normal rate, regular rhythm, no rubs or gallops.  2/6 sem        Intact distal pulses  Pulmonary :  Normal breath sounds, no stridor or respiratory distress   No rales, rhonchi, or wheezing  Abdominal:  Soft, nondistended, bowel sounds present.  No tenderness noted.   Musculoskeletal:  No lower extremity edema noted.  Lymph Nodes:  No cervical lymphadenopathy noted  Skin:  No cyanosis noted  Neurologic:  Alert, appropriate, moves all 4 extremities without obvious deficit.         Assessment & Plan:

## 2010-12-06 NOTE — Patient Instructions (Signed)
Will set up on cpap, but please call if having issues with tolerance. Work on weight loss followup with me in 5 weeks.

## 2010-12-06 NOTE — Telephone Encounter (Signed)
Discussed with patient and she agreed to take the Rx 2 tabs BID and she will call and update [place] on status    KP

## 2010-12-13 ENCOUNTER — Other Ambulatory Visit: Payer: Self-pay | Admitting: Obstetrics and Gynecology

## 2010-12-31 LAB — URINALYSIS, ROUTINE W REFLEX MICROSCOPIC
Bilirubin Urine: NEGATIVE
Glucose, UA: NEGATIVE
Hgb urine dipstick: NEGATIVE
Nitrite: NEGATIVE
Urobilinogen, UA: 0.2
pH: 5.5

## 2010-12-31 LAB — DIFFERENTIAL
Basophils Relative: 0
Eosinophils Absolute: 0
Monocytes Absolute: 0.6
Monocytes Relative: 11
Neutrophils Relative %: 48

## 2010-12-31 LAB — URINE MICROSCOPIC-ADD ON

## 2010-12-31 LAB — GC/CHLAMYDIA PROBE AMP, GENITAL
Chlamydia, DNA Probe: NEGATIVE
GC Probe Amp, Genital: NEGATIVE

## 2010-12-31 LAB — CBC
Hemoglobin: 13.3
MCHC: 33.7
MCV: 91.7
RBC: 4.3

## 2010-12-31 LAB — POCT PREGNANCY, URINE: Preg Test, Ur: NEGATIVE

## 2011-01-10 ENCOUNTER — Encounter: Payer: Self-pay | Admitting: Family Medicine

## 2011-01-10 ENCOUNTER — Ambulatory Visit (INDEPENDENT_AMBULATORY_CARE_PROVIDER_SITE_OTHER): Payer: BC Managed Care – PPO | Admitting: Family Medicine

## 2011-01-10 DIAGNOSIS — Z23 Encounter for immunization: Secondary | ICD-10-CM

## 2011-01-10 DIAGNOSIS — M549 Dorsalgia, unspecified: Secondary | ICD-10-CM

## 2011-01-10 DIAGNOSIS — M545 Low back pain: Secondary | ICD-10-CM

## 2011-01-10 LAB — POCT URINALYSIS DIPSTICK
Glucose, UA: NEGATIVE
Ketones, UA: NEGATIVE
Spec Grav, UA: 1.03

## 2011-01-10 MED ORDER — CYCLOBENZAPRINE HCL 10 MG PO TABS: 10.0000 mg | ORAL_TABLET | Freq: Three times a day (TID) | ORAL | Status: DC | PRN

## 2011-01-10 NOTE — Patient Instructions (Signed)
Back Pain & Injury Your back pain is most likely caused by a strain of the muscles or ligaments supporting the spine. Back strains cause pain and trouble moving because of muscle spasms. They may take several weeks to heal. Usually they are better in days.  Treatment for back pain includes:  Rest - Get bed rest as needed over the next day or two. Use a firm mattress and lie on your side with your knees slightly bent. If you lie on your back, put a pillow under your knees.   Early movement - Back pain improves most rapidly if you remain active. It is much more stressful on the back to sit or stand in one place. Do not sit, drive or stand in one place for more than 30 minutes at a time. Take short walks on level surfaces as soon as pain allows.   Limit bending and lifting - Do not bend over or lift anything over 20 pounds until instructed otherwise. Lift by bending your knees. Use your leg muscles to help. Keep the load close to your body and avoid twisting. Do not reach or do overhead work.   Medicines - Medicine to reduce pain and inflammation are helpful. Muscle-relaxing drugs may be prescribed.   Therapy - Put ice packs on your back every few hours for the first 2-3 days after your injury or as instructed. After that ice or heat may be alternated to reduce pain and spasm. Back exercises and gentle massage may be of some benefit. You should be examined again if your back pain is not better in one week.  SEEK IMMEDIATE MEDICAL CARE IF:  You have pain that radiates from your back into your legs.   You develop new bowel or bladder control problems.   You have unusual weakness or numbness in your arms or legs.   You develop nausea or vomiting.   You develop abdominal pain.   You feel faint.  Document Released: 03/18/2005 Document Re-Released: 12/26/2007 [employer] Patient Information 2011 [employer]. 

## 2011-01-10 NOTE — Progress Notes (Signed)
  Subjective:    Kathleen Jordan is a 40 y.o. female who presents for evaluation of low back pain. The patient has had no prior back problems. Symptoms have been present for 3 days and are gradually worsening.  Onset was related to / precipitated by a twisting movement and lifting a heavy object. The pain is located in the Right side low back and mid back and does not radiate. The pain is described as aching and occurs intermittently. She rates her pain as a 7 on a scale of 0-10. Symptoms are exacerbated by any movement. Symptoms are improved by nothing. She has also tried nothing which provided no symptom relief. She has throbbin pain in R leg. associated with the back pain. The patient has no "red flag" history indicative of complicated back pain.  The following portions of the patient's history were reviewed and updated as appropriate: allergies, current medications, past family history, past medical history, past social history, past surgical history and problem list.  Review of Systems Pertinent items are noted in HPI.    Objective:   Full range of motion without pain, no tenderness, no spasm, no curvature. Normal reflexes, gait, strength and negative straight-leg raise.    Assessment:    Nonspecific acute low back pain --- doubt UTI Genella Rife--- change to dexilant   Plan:    Natural history and expected course discussed. Questions answered. Proper lifting, bending technique discussed. Stretching exercises discussed. Short (2-4 day) period of relative rest recommended until acute symptoms improve. Ice to affected area as needed for local pain relief. Heat to affected area as needed for local pain relief. Muscle relaxants per medication orders. Follow-up in 2 weeks.

## 2011-01-13 LAB — URINE CULTURE

## 2011-01-22 ENCOUNTER — Telehealth: Payer: Self-pay

## 2011-01-22 NOTE — Telephone Encounter (Signed)
msg from patient stating she needs an Rx for the medications that was sent in for her by Dr.Lowne. The names of the medications were not left on the VM. Msg left for patient to call me back to verify meds......KP

## 2011-01-24 NOTE — Telephone Encounter (Signed)
msg left for a return call.      KP 

## 2011-01-25 NOTE — Telephone Encounter (Signed)
3rd attempt to contact the patient---Letter has been mailed     KP

## 2011-01-30 ENCOUNTER — Other Ambulatory Visit: Payer: Self-pay

## 2011-01-30 MED ORDER — FLUTICASONE PROPIONATE 50 MCG/ACT NA SUSP: 2.0000 | Freq: Every day | NASAL | Status: DC | PRN

## 2011-01-30 MED ORDER — DEXLANSOPRAZOLE 30 MG PO CPDR: 30.0000 mg | DELAYED_RELEASE_CAPSULE | Freq: Every day | ORAL | Status: DC

## 2011-05-20 ENCOUNTER — Other Ambulatory Visit: Payer: Self-pay | Admitting: Family Medicine

## 2011-05-20 ENCOUNTER — Telehealth: Payer: Self-pay | Admitting: Family Medicine

## 2011-05-20 NOTE — Telephone Encounter (Signed)
Unsure of who prescribing MD is Dexilant is not on patient med list and none of the Numbers work on the system. Please advise    KP

## 2011-05-20 NOTE — Telephone Encounter (Signed)
I changed her to dexlilant last visit----#30  1 po qd ,  11refills

## 2011-05-20 NOTE — Telephone Encounter (Signed)
Refill: Welchol Tabs 625 mg.   Requesting 90 day supply.

## 2011-05-20 NOTE — Telephone Encounter (Signed)
Rx on med list w/o direction, not sure who the prescribing MD is, Rx has not been filled in this system. Please advise     KP

## 2011-05-20 NOTE — Telephone Encounter (Signed)
welchol  3 month supply---3 tab po bid

## 2011-05-21 MED ORDER — PRENATAL RX 60-1 MG PO TABS: 1.0000 | ORAL_TABLET | Freq: Every day | ORAL | Status: DC

## 2011-05-21 MED ORDER — DEXLANSOPRAZOLE 60 MG PO CPDR: 60.0000 mg | DELAYED_RELEASE_CAPSULE | Freq: Every day | ORAL | Status: DC

## 2011-05-21 MED ORDER — COLESEVELAM HCL 625 MG PO TABS: 1875.0000 mg | ORAL_TABLET | Freq: Two times a day (BID) | ORAL | Status: DC

## 2011-05-21 NOTE — Telephone Encounter (Signed)
Rx faxed.    KP 

## 2011-05-24 ENCOUNTER — Telehealth: Payer: Self-pay | Admitting: Family Medicine

## 2011-05-24 MED ORDER — PRENATAL RX 60-1 MG PO TABS: 1.0000 | ORAL_TABLET | Freq: Every day | ORAL | Status: DC

## 2011-05-24 MED ORDER — DEXLANSOPRAZOLE 30 MG PO CPDR: 30.0000 mg | DELAYED_RELEASE_CAPSULE | Freq: Every day | ORAL | Status: DC

## 2011-05-24 NOTE — Telephone Encounter (Signed)
Refill: Dexilant dr caps 30 mg. 90 day supply  Refill: Prenaplus tabs 27mg . 90 day supply

## 2011-05-28 ENCOUNTER — Telehealth: Payer: Self-pay | Admitting: Family Medicine

## 2011-05-28 MED ORDER — PRENATAL MV-MIN-FE FUM-FA-DHA 27-0.8 & 200 MG PO MISC: 1.0000 | Freq: Every day | ORAL | Status: DC

## 2011-05-28 NOTE — Telephone Encounter (Signed)
[employer] faxed stating: Prenatal Rx 1 has been recalled by the Manufacture. Possible alternatives: Prenatal Plus & Fe Tabs 27mg .  Please send new prescription for one of these to [phone] Thank You  Judeth Cornfield

## 2011-05-29 ENCOUNTER — Telehealth: Payer: Self-pay | Admitting: Family Medicine

## 2011-05-29 NOTE — Telephone Encounter (Signed)
Olegario Messier from [facility] pharmacy called regarding this patient's rx for Dexlansoprazole 30 MG capsule. Please call back at earliest convenience.

## 2011-05-29 NOTE — Telephone Encounter (Signed)
Left message to call office. [facility] received from [facility] want to change Pt med to a cheaper alternative. Per Dr Laury Axon check with Pt to see if change is ok

## 2011-05-30 NOTE — Telephone Encounter (Signed)
msg left to call the office     KP 

## 2011-06-03 NOTE — Telephone Encounter (Signed)
Unable to contact patient--- Letter mailed     KP

## 2011-06-05 ENCOUNTER — Telehealth: Payer: Self-pay

## 2011-06-05 NOTE — Telephone Encounter (Signed)
Discussed with patient and she stated she that she is ok with switching back to nexium, sine the dexilant is not covered.   Please advise     KP

## 2011-06-05 NOTE — Telephone Encounter (Signed)
       Candie Echevaria, CMA 05/29/2011 3:30 PM Signed  Left message to call office. [facility] want to change Pt med to a cheaper alternative. Per Dr Laury Axon check with Pt to see if change is ok Marshell Garfinkel 05/29/2011 1:01 PM Signed  Olegario Messier from [facility] pharmacy called regarding this patient's rx for Dexlansoprazole 30 MG capsule. Please call back at earliest convenience.

## 2011-06-05 NOTE — Telephone Encounter (Signed)
nexium 40 mg #30  1 po qd  11 refills

## 2011-06-06 MED ORDER — ESOMEPRAZOLE MAGNESIUM 40 MG PO CPDR: 40.0000 mg | DELAYED_RELEASE_CAPSULE | Freq: Every day | ORAL | Status: DC

## 2011-06-14 ENCOUNTER — Telehealth: Payer: Self-pay | Admitting: *Deleted

## 2011-06-14 NOTE — Telephone Encounter (Signed)
[facility] Triage Call Report Triage Record Num: [id number] Operator: Freddie Breech Patient Name: Kathleen Jordan Call Date & Time: 06/14/2011 3:41:38PM Patient Phone: [phone] PCP: Lelon Perla Patient Gender: Female PCP Fax : [phone] Patient DOB: 1970-04-29 Practice Name: [facility] Reason for Call: Caller: [name]/Patient; PCP: Lelon Perla.; CB#: [phone]; ;Call regarding Cough/Congestion; LMP 05/30/11. Pt is calling for an abx for a sinus infection, urinary urgency. Clear sinus drng with facial pain and upper teeth pain. Emergent sx r/o. Appt sched for 06/15/11 @ 1045 Elam. URI Protocol. Protocol(s) Used: Upper Respiratory Infection (URI) Recommended Outcome per Protocol: See Provider within 24 hours Reason for Outcome: Facial pain (fullness, pressure, worsens with bending over), frontal headache, yellow-green nasal discharge AND any temperature elevation Care Advice: ~ 06/14/2011 4:05:42PM

## 2011-06-14 NOTE — Telephone Encounter (Signed)
Schedule for Saturday clinic.

## 2011-06-15 ENCOUNTER — Ambulatory Visit (INDEPENDENT_AMBULATORY_CARE_PROVIDER_SITE_OTHER): Payer: BC Managed Care – PPO | Admitting: Family Medicine

## 2011-06-15 ENCOUNTER — Encounter: Payer: Self-pay | Admitting: Family Medicine

## 2011-06-15 VITALS — BP 124/72 | HR 76 | Temp 98.2°F | Wt 236.0 lb

## 2011-06-15 DIAGNOSIS — J329 Chronic sinusitis, unspecified: Secondary | ICD-10-CM

## 2011-06-15 MED ORDER — AZITHROMYCIN 250 MG PO TABS: ORAL_TABLET | ORAL | Status: AC

## 2011-06-15 NOTE — Progress Notes (Signed)
  Subjective:    Patient ID: Kathleen Jordan, female    DOB: 1970-09-27, 41 y.o.   MRN: [id number]  HPI Here for 5 days of sinus pressure, PND, ST, and a dry cough. No fever.   Review of Systems  Constitutional: Negative.   HENT: Positive for congestion and postnasal drip.   Eyes: Negative.   Respiratory: Positive for cough.        Objective:   Physical Exam  Constitutional: She appears well-developed and well-nourished.  HENT:  Left Ear: External ear normal.  Nose: Nose normal.  Mouth/Throat: Oropharynx is clear and moist. No oropharyngeal exudate.  Eyes: Conjunctivae are normal.  Pulmonary/Chest: Effort normal and breath sounds normal.  Lymphadenopathy:    She has no cervical adenopathy.          Assessment & Plan:  Add Mucinex

## 2011-07-02 ENCOUNTER — Encounter: Payer: Self-pay | Admitting: Family

## 2011-07-02 ENCOUNTER — Telehealth: Payer: Self-pay | Admitting: Family

## 2011-07-02 ENCOUNTER — Ambulatory Visit (INDEPENDENT_AMBULATORY_CARE_PROVIDER_SITE_OTHER): Payer: BC Managed Care – PPO | Admitting: Family

## 2011-07-02 ENCOUNTER — Telehealth: Payer: Self-pay | Admitting: Family Medicine

## 2011-07-02 VITALS — BP 130/74 | HR 97 | Temp 97.4°F | Resp 16 | Wt 258.0 lb

## 2011-07-02 DIAGNOSIS — J329 Chronic sinusitis, unspecified: Secondary | ICD-10-CM

## 2011-07-02 MED ORDER — CETIRIZINE HCL 10 MG PO TABS: 10.0000 mg | ORAL_TABLET | Freq: Every day | ORAL | Status: DC

## 2011-07-02 MED ORDER — CEFUROXIME AXETIL 500 MG PO TABS: 500.0000 mg | ORAL_TABLET | Freq: Two times a day (BID) | ORAL | Status: AC

## 2011-07-02 MED ORDER — LEVOFLOXACIN 500 MG PO TABS: 500.0000 mg | ORAL_TABLET | Freq: Every day | ORAL | Status: AC

## 2011-07-02 NOTE — Telephone Encounter (Signed)
Noted that pt is on prenatal vitamin.  She tells me that she and her husband are trying to get pregnant.  I advised her to take Ceftin instead of levaquin due to safety during pregnancy.  Pt verbalizes understanding.

## 2011-07-02 NOTE — Progress Notes (Signed)
Subjective:    Patient ID: Kathleen Jordan, female    DOB: 1970/11/18, 41 y.o.   MRN: [id number]  HPI  Kathleen Jordan is a 41 yr old female who presents today with chief complaint of sinus pain.  Reports that symptoms started on 3/30.  She started mucinex/flonase that day.  Went to urgent care yesterday- was placed on amoxicillin- she has taken 3 doses so far and notes worsening of her sinus pain/pressure.  Nasal discharge is clear, sometimes yellow. She denies fever.  She denies post nasal drip.  Review of Systems See HPI  Past Medical History  Diagnosis Date  . Hypertension   . Hyperlipidemia   . PCOS (polycystic ovarian syndrome)   . Diabetes mellitus   . Allergic rhinitis     History   Social History  . Marital Status: Married    Spouse Name: Caryn Bee    Number of Children: 0  . Years of Education: N/A   Occupational History  . Psychotherapist     [employer]   Social History Main Topics  . Smoking status: Never Smoker   . Smokeless tobacco: Not on file  . Alcohol Use: No  . Drug Use: No  . Sexually Active: Yes -- Female partner(s)   Other Topics Concern  . Not on file   Social History Narrative  . No narrative on file    Past Surgical History  Procedure Date  . Breast reduction surgery 07-2003  . Invitro fertilization 11/16/2010    [facility]    Family History  Problem Relation Age of Onset  . Arthritis Mother   . Diabetes Mother   . Hypertension Mother   . Polycystic ovary syndrome Sister   . Diabetes Brother   . Hypertension Brother   . Arthritis Maternal Grandmother   . Diabetes Maternal Grandmother   . Hypertension Maternal Grandmother   . Stroke Maternal Grandfather   . Asthma Sister   . Allergies Sister     No Known Allergies  Current Outpatient Prescriptions on File Prior to Visit  Medication Sig Dispense Refill  . B-D 3CC LUER-LOK SYR 18GX1-1/2 18G X 1-1/2" 3 ML MISC       . BD DISP NEEDLES 22G X 1-1/2" MISC        . colesevelam (WELCHOL) 625 MG tablet Take 3 tablets (1,875 mg total) by mouth 2 (two) times daily with a meal.  180 tablet  2  . cyclobenzaprine (FLEXERIL) 10 MG tablet Take 1 tablet (10 mg total) by mouth every 8 (eight) hours as needed for muscle spasms.  30 tablet  1  . esomeprazole (NEXIUM) 40 MG capsule Take 1 capsule (40 mg total) by mouth daily.  90 capsule  3  . fluticasone (FLONASE) 50 MCG/ACT nasal spray Place 2 sprays into the nose daily as needed.  16 g  1  . insulin detemir (LEVEMIR FLEXPEN) 100 UNIT/ML injection Inject 12 Units into the skin at bedtime.      Marland Kitchen NIFEdipine (NIFEDICAL XL) 60 MG 24 hr tablet Take 1 tablet (60 mg total) by mouth daily.  90 tablet  3  . Prenatal MV-Min-Fe Fum-FA-DHA 27-0.8 & 200 MG MISC Take 1 tablet by mouth daily.  90 tablet  3  . sitaGLIPtan-metformin (JANUMET) 50-500 MG per tablet Take 1 tablet by mouth 2 (two) times daily with a meal.        . Vitamin D, Ergocalciferol, (DRISDOL) 50000 UNITS CAPS       .  DISCONTD: Dexlansoprazole 30 MG capsule Take 1 capsule (30 mg total) by mouth daily.  30 capsule  2  . DISCONTD: Dexlansoprazole 30 MG capsule Take 1 capsule (30 mg total) by mouth daily.  90 capsule  1  . acetaminophen-codeine (TYLENOL #3) 300-30 MG per tablet       . cetirizine (ZYRTEC) 10 MG tablet Take 1 tablet (10 mg total) by mouth daily.  30 tablet  11  . DISCONTD: esomeprazole (NEXIUM) 40 MG capsule Take 1 capsule (40 mg total) by mouth daily.  30 capsule  1    BP 130/74  Pulse 97  Temp(Src) 97.4 F (36.3 C) (Oral)  Resp 16  Wt 258 lb (117.028 kg)  SpO2 98%  LMP 06/23/2011       Objective:   Physical Exam  Constitutional: She appears well-developed and well-nourished. No distress.  HENT:  Right Ear: Tympanic membrane and ear canal normal.  Left Ear: Tympanic membrane and ear canal normal.  Mouth/Throat: No posterior oropharyngeal edema or posterior oropharyngeal erythema.       Mild frontal and maxillary sinus tenderness  to palpation.   Cardiovascular: Normal rate and regular rhythm.   No murmur heard. Pulmonary/Chest: Effort normal and breath sounds normal. No respiratory distress. She has no wheezes. She has no rales. She exhibits no tenderness.          Assessment & Plan:

## 2011-07-02 NOTE — Assessment & Plan Note (Signed)
Stop amoxicillin, start levaquin.  Add Zyrtec. Call if symptoms worsen or if not improved in 2-3 days.

## 2011-07-02 NOTE — Telephone Encounter (Signed)
Caller: Annaliz/patient; PCP: Lelon Perla.; CB#: [phone]; ; ; Call regarding Has Been Having Issues With Nasal Congestion.  Afebrile.  States if she takes flonase and mucinex, it clears up within a day or so.  Seen in [facility] 07/01/11 and started antibiotic/amoxicillin.  States using flonase BID without any relief.  States she is very frustrated at the congestion and is unable to function.  Has facial pain and c /o earache.   Per protocol, emergent symptoms denied; advised appt within 24 hours.  Caller states she is going out of town 07/03/11 0500 and cannot wait till AM for appt.  Appt sched in [facility] office 1530 07/02/11 with Sandford Craze.

## 2011-07-02 NOTE — Patient Instructions (Signed)
Continue Ceterizine (Zyrtec) 10mg  once daily. Stop Amoxicillin, start Levaquin. Nasal saline wash 2-3 times daily. Call if symptoms worsen, or if no improvement in 2-3 days.  Sinusitis Sinuses are air pockets within the bones of your face. The growth of bacteria within a sinus leads to infection. The infection prevents the sinuses from draining. This infection is called sinusitis. SYMPTOMS  There will be different areas of pain depending on which sinuses have become infected.  The maxillary sinuses often produce pain beneath the eyes.   Frontal sinusitis may cause pain in the middle of the forehead and above the eyes.  Other problems (symptoms) include:  Toothaches.   Colored, pus-like (purulent) drainage from the nose.   Swelling, warmth, and tenderness over the sinus areas may be signs of infection.  TREATMENT  Sinusitis is most often determined by an exam.X-rays may be taken. If x-rays have been taken, make sure you obtain your results or find out how you are to obtain them. Your caregiver may give you medications (antibiotics). These are medications that will help kill the bacteria causing the infection. You may also be given a medication (decongestant) that helps to reduce sinus swelling.  HOME CARE INSTRUCTIONS   Only take over-the-counter or prescription medicines for pain, discomfort, or fever as directed by your caregiver.   Drink extra fluids. Fluids help thin the mucus so your sinuses can drain more easily.   Applying either moist heat or ice packs to the sinus areas may help relieve discomfort.   Use saline nasal sprays to help moisten your sinuses. The sprays can be found at your local drugstore.  SEEK IMMEDIATE MEDICAL CARE IF:  You have a fever.   You have increasing pain, severe headaches, or toothache.   You have nausea, vomiting, or drowsiness.   You develop unusual swelling around the face or trouble seeing.  MAKE SURE YOU:   Understand these instructions.    Will watch your condition.   Will get help right away if you are not doing well or get worse.  Document Released: 03/18/2005 Document Revised: 03/07/2011 Document Reviewed: 10/15/2006 [facility] Patient Information 2012 [place], [place].

## 2011-08-20 ENCOUNTER — Other Ambulatory Visit: Payer: Self-pay | Admitting: *Deleted

## 2011-08-20 MED ORDER — COLESEVELAM HCL 625 MG PO TABS: 1875.0000 mg | ORAL_TABLET | Freq: Two times a day (BID) | ORAL | Status: DC

## 2011-10-29 ENCOUNTER — Ambulatory Visit (INDEPENDENT_AMBULATORY_CARE_PROVIDER_SITE_OTHER): Payer: BC Managed Care – PPO | Admitting: Family Medicine

## 2011-10-29 ENCOUNTER — Encounter: Payer: Self-pay | Admitting: Family Medicine

## 2011-10-29 VITALS — BP 118/70 | HR 87 | Temp 98.7°F | Wt 239.4 lb

## 2011-10-29 DIAGNOSIS — N898 Other specified noninflammatory disorders of vagina: Secondary | ICD-10-CM

## 2011-10-29 DIAGNOSIS — B359 Dermatophytosis, unspecified: Secondary | ICD-10-CM

## 2011-10-29 DIAGNOSIS — R300 Dysuria: Secondary | ICD-10-CM

## 2011-10-29 DIAGNOSIS — L853 Xerosis cutis: Secondary | ICD-10-CM

## 2011-10-29 DIAGNOSIS — L258 Unspecified contact dermatitis due to other agents: Secondary | ICD-10-CM

## 2011-10-29 LAB — POCT URINALYSIS DIPSTICK
Ketones, UA: NEGATIVE
Protein, UA: NEGATIVE
Spec Grav, UA: 1.025
pH, UA: 5

## 2011-10-29 MED ORDER — CARRINGTON MOISTURE BARRIER EX CREA: TOPICAL_CREAM | CUTANEOUS | Status: DC | PRN

## 2011-10-29 MED ORDER — BUTENAFINE HCL 1 % EX CREA: TOPICAL_CREAM | CUTANEOUS | Status: DC

## 2011-10-29 NOTE — Progress Notes (Signed)
  Subjective:    Patient ID: Kathleen Jordan, female    DOB: 1970-04-18, 41 y.o.   MRN: [id number]  HPI Pt here c/o itching on back and ext vaginal/ pubic area.  No vaginal d/c.  No rash.  No new detergents, soaps , lotions etc.    Review of Systems As above    Objective:   Physical Exam  Constitutional: She appears well-developed and well-nourished. No distress.  Genitourinary: Vaginal discharge found.       Scant amount d/c--wet prep done  Skin: Skin is dry. No rash noted. No erythema. No pallor.       Skin is very dry on back and pubic area  Psychiatric: She has a normal mood and affect. Her behavior is normal. Judgment normal.          Assessment & Plan:  Dry skin--  eucerin lotion Tinea cruris--- lotrimen ultra Vaginal d/c--pt with no complaints,  Wet prep sent

## 2011-10-30 LAB — WET PREP BY MOLECULAR PROBE: Candida species: NEGATIVE

## 2011-11-18 ENCOUNTER — Other Ambulatory Visit: Payer: Self-pay | Admitting: Family Medicine

## 2012-01-02 ENCOUNTER — Ambulatory Visit (INDEPENDENT_AMBULATORY_CARE_PROVIDER_SITE_OTHER): Payer: BC Managed Care – PPO | Admitting: Family Medicine

## 2012-01-02 ENCOUNTER — Other Ambulatory Visit: Payer: Self-pay | Admitting: Family Medicine

## 2012-01-02 ENCOUNTER — Encounter: Payer: Self-pay | Admitting: Family Medicine

## 2012-01-02 VITALS — BP 140/90 | HR 84 | Temp 98.4°F | Wt 239.2 lb

## 2012-01-02 DIAGNOSIS — M549 Dorsalgia, unspecified: Secondary | ICD-10-CM

## 2012-01-02 DIAGNOSIS — I10 Essential (primary) hypertension: Secondary | ICD-10-CM

## 2012-01-02 DIAGNOSIS — R079 Chest pain, unspecified: Secondary | ICD-10-CM

## 2012-01-02 DIAGNOSIS — R1011 Right upper quadrant pain: Secondary | ICD-10-CM

## 2012-01-02 LAB — CBC WITH DIFFERENTIAL/PLATELET
Basophils Absolute: 0 10*3/uL (ref 0.0–0.1)
Eosinophils Absolute: 0.2 10*3/uL (ref 0.0–0.7)
Hemoglobin: 13.6 g/dL (ref 12.0–15.0)
Lymphocytes Relative: 41.7 % (ref 12.0–46.0)
Lymphs Abs: 3 10*3/uL (ref 0.7–4.0)
MCHC: 33.4 g/dL (ref 30.0–36.0)
MCV: 90.7 fl (ref 78.0–100.0)
Monocytes Absolute: 0.4 10*3/uL (ref 0.1–1.0)
Neutro Abs: 3.5 10*3/uL (ref 1.4–7.7)
RDW: 12.9 % (ref 11.5–14.6)

## 2012-01-02 LAB — POCT URINALYSIS DIPSTICK
Bilirubin, UA: NEGATIVE
Glucose, UA: NEGATIVE
Leukocytes, UA: NEGATIVE
Nitrite, UA: NEGATIVE
Urobilinogen, UA: 0.2

## 2012-01-02 LAB — BASIC METABOLIC PANEL
CO2: 23 mEq/L (ref 19–32)
Calcium: 9.6 mg/dL (ref 8.4–10.5)
Glucose, Bld: 147 mg/dL — ABNORMAL HIGH (ref 70–99)
Sodium: 139 mEq/L (ref 135–145)

## 2012-01-02 LAB — CARDIAC PANEL
CK-MB: 2.1 ng/mL (ref 0.3–4.0)
Relative Index: 1.3 calc (ref 0.0–2.5)

## 2012-01-02 LAB — D-DIMER, QUANTITATIVE: D-Dimer, Quant: 0.32 ug/mL-FEU (ref 0.00–0.48)

## 2012-01-02 LAB — TROPONIN I: Troponin I: <0.01 ng/mL (ref <0.06)

## 2012-01-02 LAB — HEPATIC FUNCTION PANEL
Albumin: 4.3 g/dL (ref 3.5–5.2)
Alkaline Phosphatase: 65 U/L (ref 39–117)

## 2012-01-02 MED ORDER — GI COCKTAIL ~~LOC~~
30.0000 mL | Freq: Once | ORAL | Status: AC
  Administered 2012-01-02: 30 mL via ORAL

## 2012-01-02 MED ORDER — CYCLOBENZAPRINE HCL 10 MG PO TABS: 10.0000 mg | ORAL_TABLET | Freq: Three times a day (TID) | ORAL | Status: DC | PRN

## 2012-01-02 MED ORDER — NIFEDIPINE ER 90 MG PO TB24: 90.0000 mg | ORAL_TABLET | Freq: Every day | ORAL | Status: DC

## 2012-01-02 NOTE — Progress Notes (Signed)
  Subjective:    Patient here for follow-up of elevated blood pressure.  She is not exercising and is adherent to a low-salt diet.  Blood pressure is not well controlled at home. Cardiac symptoms: chest pain and chest pressure/discomfort. Patient denies: claudication, dyspnea, fatigue, irregular heart beat, lower extremity edema, near-syncope, orthopnea, palpitations, paroxysmal nocturnal dyspnea, syncope and tachypnea. Cardiovascular risk factors: diabetes mellitus, dyslipidemia, hypertension, obesity (BMI >= 30 kg/m2) and sedentary lifestyle. Use of agents associated with hypertension: none. History of target organ damage: none.  Chest pain comes and goes and radiates to back of neck.    Pt states she is burping often and gets full fast.     The following portions of the patient's history were reviewed and updated as appropriate: allergies, current medications, past family history, past medical history, past social history, past surgical history and problem list.  Review of Systems Pertinent items are noted in HPI.     Objective:    BP 140/90  Pulse 84  Temp 98.4 F (36.9 C)  Wt 239 lb 3.2 oz (108.5 kg)  SpO2 95% General appearance: alert, cooperative, appears stated age and no distress Neck: no adenopathy, supple, symmetrical, trachea midline and thyroid not enlarged, symmetric, no tenderness/mass/nodules Lungs: clear to auscultation bilaterally Heart: regular rate and rhythm, S1, S2 normal, no murmur, click, rub or gallop Abdomen: normal findings: soft and abnormal findings:  mild tenderness in the epigastrium Extremities: extremities normal, atraumatic, no cyanosis or edema    Assessment:    Hypertension, stage 1 . Evidence of target organ damage: none.   chest pain---suspect gerd---pt had not been taking nexium because her husband took it--pt given GI cocktail in office and symptoms subsided. Pt given a few samples of nexium and instructed her husband not to take it and to come in  for appointment to evaluate gerd Plan:    Medication: increase to nifidipine 90. Dietary sodium restriction. Regular aerobic exercise. Check blood pressures 2-3 times weekly and record. Follow up: 2 weeks and as needed.

## 2012-01-02 NOTE — Patient Instructions (Addendum)
Chest Pain (Nonspecific) It is often hard to give a specific diagnosis for the cause of chest pain. There is always a chance that your pain could be related to something serious, such as a heart attack or a blood clot in the lungs. You need to follow up with your caregiver for further evaluation. CAUSES   Heartburn.  Pneumonia or bronchitis.  Anxiety or stress.  Inflammation around your heart (pericarditis) or lung (pleuritis or pleurisy).  A blood clot in the lung.  A collapsed lung (pneumothorax). It can develop suddenly on its own (spontaneous pneumothorax) or from injury (trauma) to the chest.  Shingles infection (herpes zoster virus). The chest wall is composed of bones, muscles, and cartilage. Any of these can be the source of the pain.  The bones can be bruised by injury.  The muscles or cartilage can be strained by coughing or overwork.  The cartilage can be affected by inflammation and become sore (costochondritis). DIAGNOSIS  Lab tests or other studies, such as X-rays, electrocardiography, stress testing, or cardiac imaging, may be needed to find the cause of your pain.  TREATMENT   Treatment depends on what may be causing your chest pain. Treatment may include:  Acid blockers for heartburn.  Anti-inflammatory medicine.  Pain medicine for inflammatory conditions.  Antibiotics if an infection is present.  You may be advised to change lifestyle habits. This includes stopping smoking and avoiding alcohol, caffeine, and chocolate.  You may be advised to keep your head raised (elevated) when sleeping. This reduces the chance of acid going backward from your stomach into your esophagus.  Most of the time, nonspecific chest pain will improve within 2 to 3 days with rest and mild pain medicine. HOME CARE INSTRUCTIONS   If antibiotics were prescribed, take your antibiotics as directed. Finish them even if you start to feel better.  For the next few days, avoid physical  activities that bring on chest pain. Continue physical activities as directed.  Do not smoke.  Avoid drinking alcohol.  Only take over-the-counter or prescription medicine for pain, discomfort, or fever as directed by your caregiver.  Follow your caregiver's suggestions for further testing if your chest pain does not go away.  Keep any follow-up appointments you made. If you do not go to an appointment, you could develop lasting (chronic) problems with pain. If there is any problem keeping an appointment, you must call to reschedule. SEEK MEDICAL CARE IF:   You think you are having problems from the medicine you are taking. Read your medicine instructions carefully.  Your chest pain does not go away, even after treatment.  You develop a rash with blisters on your chest. SEEK IMMEDIATE MEDICAL CARE IF:   You have increased chest pain or pain that spreads to your arm, neck, jaw, back, or abdomen.  You develop shortness of breath, an increasing cough, or you are coughing up blood.  You have severe back or abdominal pain, feel nauseous, or vomit.  You develop severe weakness, fainting, or chills.  You have a fever. THIS IS AN EMERGENCY. Do not wait to see if the pain will go away. Get medical help at once. Call your local emergency services (911 in [place] Do not drive yourself to the hospital. MAKE SURE YOU:   Understand these instructions.  Will watch your condition.  Will get help right away if you are not doing well or get worse. Document Released: 12/26/2004 Document Revised: 06/10/2011 Document Reviewed: 10/22/2007 [employer] Patient Information 2013 [employer].  

## 2012-01-03 ENCOUNTER — Telehealth: Payer: Self-pay

## 2012-01-03 NOTE — Telephone Encounter (Signed)
Pt called in concerning appt someone had called and left message I fount out it was Renee referred pt to [facility].        MW

## 2012-01-03 NOTE — Telephone Encounter (Signed)
I spoke with patient, she is scheduled for her abdominal ultrasound at [facility], and she is aware.

## 2012-01-07 ENCOUNTER — Ambulatory Visit (HOSPITAL_BASED_OUTPATIENT_CLINIC_OR_DEPARTMENT_OTHER)
Admission: RE | Admit: 2012-01-07 | Discharge: 2012-01-07 | Disposition: A | Discharge status: Home / Self Care | Payer: BC Managed Care – PPO | Source: Ambulatory Visit | Attending: Family Medicine | Admitting: Family Medicine

## 2012-01-07 DIAGNOSIS — E119 Type 2 diabetes mellitus without complications: Secondary | ICD-10-CM | POA: Insufficient documentation

## 2012-01-07 DIAGNOSIS — R7989 Other specified abnormal findings of blood chemistry: Secondary | ICD-10-CM | POA: Insufficient documentation

## 2012-01-07 DIAGNOSIS — R1011 Right upper quadrant pain: Secondary | ICD-10-CM | POA: Insufficient documentation

## 2012-01-07 DIAGNOSIS — K7689 Other specified diseases of liver: Secondary | ICD-10-CM | POA: Insufficient documentation

## 2012-01-07 DIAGNOSIS — R1013 Epigastric pain: Secondary | ICD-10-CM | POA: Insufficient documentation

## 2012-01-08 ENCOUNTER — Other Ambulatory Visit (HOSPITAL_BASED_OUTPATIENT_CLINIC_OR_DEPARTMENT_OTHER): Payer: BC Managed Care – PPO

## 2012-03-23 LAB — OB RESULTS CONSOLE GC/CHLAMYDIA: Gonorrhea: NEGATIVE

## 2012-03-23 LAB — OB RESULTS CONSOLE ABO/RH

## 2012-03-23 LAB — OB RESULTS CONSOLE ANTIBODY SCREEN: Antibody Screen: NEGATIVE

## 2012-03-23 LAB — OB RESULTS CONSOLE RUBELLA ANTIBODY, IGM: Rubella: IMMUNE

## 2012-03-26 ENCOUNTER — Telehealth: Payer: Self-pay | Admitting: *Deleted

## 2012-03-26 NOTE — Telephone Encounter (Signed)
Pt states that she just found out that she is [redacted] weeks pregnant and would like to get a call to see back to review med Rx by Dr Laury Axon to see if they are safe to take during pregnancy. Pt indicated that see spoke with GYN and they advise her to contact her PCP. Marland KitchenLeft message to call office

## 2012-03-27 NOTE — Telephone Encounter (Signed)
Discuss with patient  

## 2012-03-27 NOTE — Telephone Encounter (Signed)
Zyrtec and Nexium are Category B in pregnancy- perfectly safe (same level as tylenol) Welchol and Nifedipine are both Category C.  Nifedipine is often used in pregnancy to stop pre-term labor so this is a discussion that she really needs to have w/ her OB.  Plus, high blood pressure can be problematic during pregnancy.  At this time, she can stop the [facility] and just follow after delivery

## 2012-03-27 NOTE — Telephone Encounter (Signed)
Pt would like to know if these med are safe to continue in pregnancy  (Welchol, nifedipine, nexium, zyrtec) and if not Please advise on substitute,

## 2012-04-01 NOTE — L&D Delivery Note (Signed)
Delivery Note  First Stage: Labor onset: 0300 Augmentation : Pitocin Analgesia /Anesthesia intrapartum: Epidural AROM on 11/12/12 @2344   Second Stage: Complete dilation at 0748 Onset of pushing at 0752 FHR second stage  180 / moderate variability / early variables with pushing  Delivery of a viable female at [phone] by CNM in OA position No nuchal cord Cord double clamped after cessation of pulsation, cut by FOB Cord blood sample collected   Third Stage: Placenta delivered via Tomasa Blase intact with 3 VC @ 0945 Placenta disposition: L&D Uterine tone firm / bleeding moderate  2nd degree vaginal and 1st degree labial laceration identified  Anesthesia for repair: Epidural Repair 3.0 chromic gut Est. Blood Loss (mL): 400  Complications: Shoulder dystocia resolved with McRoberts maneuver x 1 / anterior shoulder delivered followed by posterior shoulder, velamentous cord insertion  Mom to postpartum.  Baby to NICU - d/t respiratory distress.  Newborn: Birth Weight: 10 lbs 9.9 oz  Apgar Scores: 6/7/8 Feeding planned: breast  Raelyn Mora, M  MSN, CNM 11/13/2012, 10:44 AM

## 2012-04-06 ENCOUNTER — Other Ambulatory Visit: Payer: Self-pay | Admitting: Family Medicine

## 2012-04-07 ENCOUNTER — Encounter: Payer: BC Managed Care – PPO | Admitting: Family Medicine

## 2012-04-09 ENCOUNTER — Telehealth: Payer: Self-pay | Admitting: Family Medicine

## 2012-04-09 NOTE — Telephone Encounter (Signed)
Patient Information:  Caller Name: Jalilah  Phone: [phone]  Patient: Kathleen Jordan, Kathleen Jordan  Gender: Female  DOB: 04/23/1970  Age: 42 Years  PCP: Lelon Perla.  Pregnant: No  Office Follow Up:  Does the office need to follow up with this patient?: No  Instructions For The Office: N/A  RN Note:  Patient will contact OB/GYN for list of approved pregnancy OTC medications. Tylenol 650mg  po every 4-6 hours for headache pain. Advised no oral antibioitcs without evaluation. Declined appt. will try home treatment first.  Symptoms  Reason For Call & Symptoms: Requesting antibiotic. Feels as if she has another sinus infection. Onset Monday. [redacted] weeks pregnant [facility]) . Coughing + productive yellow, sore throat ,congestion  and stuffy nose/ Frontal headache above eyes  Reviewed Health History In EMR: Yes  Reviewed Medications In EMR: Yes  Reviewed Allergies In EMR: Yes  Reviewed Surgeries / Procedures: No  Date of Onset of Symptoms: 04/06/2012  Treatments Tried: flonase  Treatments Tried Worked: No OB / GYN:  LMP: 02/15/2012  Guideline(s) Used:  Sinus Pain and Congestion  Disposition Per Guideline:   Home Care  Reason For Disposition Reached:   Sinus congestion as part of a cold, present < 10 days  Advice Given:  Reassurance:   Sinus congestion is a normal part of a cold.  Usually home treatment with nasal washes can prevent an actual bacterial sinus infection.  Here is some care advice that should help.  For a Runny Nose With Profuse Discharge:  Nasal mucus and discharge helps to wash viruses and bacteria out of the nose and sinuses.  Blowing the nose is all that is needed.  If the skin around your nostrils gets irritated, apply a tiny amount of petroleum ointment to the nasal openings once or twice a day.  For a Stuffy Nose - Use Nasal Washes:  Introduction: Saline (salt water) nasal irrigation (nasal wash) is an effective and simple home remedy for treating  stuffy nose and sinus congestion. The nose can be irrigated by pouring, spraying, or squirting salt water into the nose and then letting it run back out.  How it Helps: The salt water rinses out excess mucus, washes out any irritants (dust, allergens) that might be present, and moistens the nasal cavity.  Methods: There are several ways to perform nasal irrigation. You can use a saline nasal spray bottle (available over-the-counter), a rubber ear syringe, a medical syringe without the needle, or a Neti Pot.  How to Make Saline [facility] Water) Nasal Wash :  You can make your own saline nasal wash.  Add 1/2 tsp of table salt to 1 cup (8 oz; 240 ml) of warm water.  You should use sterile, distilled, or previously boiled water for nasal irrigation.  Pain and Fever Medicines:  For pain or fever relief, take either acetaminophen or ibuprofen.  Pain and Fever Medicines:  For pain or fever relief, take either acetaminophen or ibuprofen.  Acetaminophen (e.g., Tylenol):  Regular Strength Tylenol: Take 650 mg (two 325 mg pills) by mouth every 4-6 hours as needed. Each Regular Strength Tylenol pill has 325 mg of acetaminophen.  Hydration:  Drink plenty of liquids (6-8 glasses of water daily). If the air in your home is dry, use a cool mist humidifier  Expected Course:  Sinus congestion from viral upper respiratory infections (colds) usually lasts 5-10 days.  Occasionally a cold can worsen and turn into bacterial sinusitis. Clues to this are sinus symptoms lasting longer  than 10 days, fever lasting longer than 3 days, and worsening pain. Bacterial sinusitis may need antibiotic treatment.  Call Back If:   Severe pain lasts longer than 2 hours after pain medicine  Sinus pain lasts longer than 1 day after starting treatment using nasal washes  Sinus congestion (fullness) lasts longer than 10 days  Fever lasts longer than 3 days  You become worse.

## 2012-04-09 NOTE — Telephone Encounter (Signed)
Does she need appointment?

## 2012-04-09 NOTE — Telephone Encounter (Signed)
Patient will contact OB/GYN for list of approved pregnancy OTC medications. Tylenol 650mg  po every 4-6 hours for headache pain. Per [facility] patient was advised no oral antibioitcs without evaluation but she declined appt. will try home treatment first.      KP

## 2012-04-14 ENCOUNTER — Ambulatory Visit (INDEPENDENT_AMBULATORY_CARE_PROVIDER_SITE_OTHER): Payer: BC Managed Care – PPO | Admitting: Pulmonary Disease

## 2012-04-14 ENCOUNTER — Encounter: Payer: Self-pay | Admitting: Pulmonary Disease

## 2012-04-14 ENCOUNTER — Telehealth: Payer: Self-pay | Admitting: Pulmonary Disease

## 2012-04-14 VITALS — BP 132/78 | HR 90 | Temp 98.5°F | Ht 68.0 in | Wt 233.4 lb

## 2012-04-14 DIAGNOSIS — G4733 Obstructive sleep apnea (adult) (pediatric): Secondary | ICD-10-CM

## 2012-04-14 NOTE — Telephone Encounter (Signed)
LMTC x 1  

## 2012-04-14 NOTE — Progress Notes (Signed)
  Subjective:    Patient ID: Kathleen Jordan, female    DOB: 1970/07/28, 42 y.o.   MRN: [id number]  HPI Patient comes in today for followup of her obstructive sleep apnea.  She has not been seen since September of 2012, and failed to show for her followup scheduled in 5 weeks after starting CPAP.  Unfortunately, this means that she has been on a moderate pressure level, and never had her pressure optimized.  She currently is pregnant, and her obstetrician stressed to her the importance of sleep apnea treatment during her pregnancy.  She has not been wearing CPAP because of mask issues initially, but now she is having issues with pressure.  She feels that her current mask is comfortable and does not leak.   Review of Systems  Constitutional: Negative for fever and unexpected weight change.  HENT: Positive for congestion, rhinorrhea and postnasal drip. Negative for ear pain, nosebleeds, sore throat, sneezing, trouble swallowing, dental problem and sinus pressure.   Eyes: Negative for redness and itching.  Respiratory: Positive for cough. Negative for chest tightness, shortness of breath and wheezing.   Cardiovascular: Negative for palpitations and leg swelling.  Gastrointestinal: Positive for nausea ( morning sickness). Negative for vomiting.  Genitourinary: Negative for dysuria.  Musculoskeletal: Negative for joint swelling.  Skin: Negative for rash.  Neurological: Positive for headaches.  Hematological: Does not bruise/bleed easily.  Psychiatric/Behavioral: Negative for dysphoric mood. The patient is not nervous/anxious.        Objective:   Physical Exam Obese female in no acute distress Nose without purulence or discharge noted No skin breakdown or pressure necrosis from the CPAP mask Lower extremities mild edema, cyanosis Alert and oriented, moves all 4 extremities.  Does not appear to be overly sleepy.       Assessment & Plan:

## 2012-04-14 NOTE — Patient Instructions (Addendum)
Will have your machine changed to the "auto" mode so that we can optimize your pressure, and also see if this mode is more comfortable for you. Will call you once I receive your download, but we can leave on auto if you feel this is more comfortable. Please call if you continue to have tolerance issues. Keep up with mask changes and supplies. followup with me in 6mos if doing well.

## 2012-04-14 NOTE — Telephone Encounter (Signed)
PT reports that she had stopped using cpap for a while then started back recently due to pregnancy and now feels like the pressure is too high.  Pt given appt with Dr Shelle Iron today at 4:00 to re- evaluate cpap.

## 2012-04-14 NOTE — Assessment & Plan Note (Signed)
The patient has not been wearing her CPAP compliantly, but now is pregnant which complicates the situation.  I have stressed to her the importance of aggressive treatment of her sleep disordered breathing during her pregnancy, and the impact it may have on her baby.  She initially had mask issues, but now her mask is very comfortable.  Her main issue currently is pressure intolerance, and we'll therefore try her on the automatic setting to see if it is more comfortable for her.  This will also allow [place] to see her optimal CPAP pressure.  I will call her once I receive her download.

## 2012-04-14 NOTE — Telephone Encounter (Signed)
Returning call can be reached at [phone].Kathleen Jordan

## 2012-04-23 ENCOUNTER — Ambulatory Visit (INDEPENDENT_AMBULATORY_CARE_PROVIDER_SITE_OTHER): Payer: BC Managed Care – PPO | Admitting: Internal Medicine

## 2012-04-23 ENCOUNTER — Other Ambulatory Visit: Payer: Self-pay | Admitting: Family Medicine

## 2012-04-23 ENCOUNTER — Encounter: Payer: Self-pay | Admitting: Internal Medicine

## 2012-04-23 VITALS — BP 124/78 | HR 72 | Temp 98.9°F | Wt 234.0 lb

## 2012-04-23 DIAGNOSIS — J019 Acute sinusitis, unspecified: Secondary | ICD-10-CM

## 2012-04-23 MED ORDER — AMOXICILLIN 500 MG PO CAPS: 500.0000 mg | ORAL_CAPSULE | Freq: Three times a day (TID) | ORAL | Status: DC

## 2012-04-23 NOTE — Progress Notes (Signed)
  Subjective:    Patient ID: Kathleen Jordan, female    DOB: June 20, 1970, 42 y.o.   MRN: [id number]  HPI The respiratory tract symptoms began 04/06/12 as sore throat , chest congestion, cough with clear- yellow sputum.  Significant active  associated symptoms include frontal headache, facial pain, dental pain, & earache.    Cough is not associated with  shortness of breath and wheezing .    Chills and sweats present @ night    Myalgias and arthralgias are minor  Flu shot current     Extrinsic symptoms of sneezing were present .     Treatment with  NSAIDS, Tylenol,Mucinex was partially effective   There is no history of asthma. The patient had never smoked.                  Review of Systems Symptoms not present include sore throat, nasal purulence, and otic discharge. Fever not present  Itchy , watery eyes were not noted.    Objective:   Physical Exam General appearance:well nourished; no acute distress or increased work of breathing is present.  No  lymphadenopathy about the head, neck, or axilla noted.  Eyes: No conjunctival inflammation or lid edema is present.  Ears:  External ear exam shows no significant lesions or deformities.  Otoscopic examination reveals clear canals, tympanic membranes are intact bilaterally without bulging, retraction, inflammation or discharge. Nose:  External nasal examination shows no deformity or inflammation. Nasal mucosa are dry without lesions or exudates. No septal dislocation or deviation.No obstruction to airflow.  Oral exam: Dental hygiene is good; lips and gums are healthy appearing.There is no oropharyngeal erythema or exudate noted.  Neck:  No deformities,  masses, or tenderness noted.   Heart:  Normal rate and regular rhythm. S1 and S2 normal without gallop, murmur, click, rub or other extra sounds.  Lungs:Chest clear to auscultation; no wheezes, rhonchi,rales ,or rubs present.No increased work of breathing.    Extremities:  No cyanosis, edema, or clubbing  noted  Skin: Warm & dry         Assessment & Plan:  #1 rhinosinusitis with possible component bronchitis  Plan: Nasal hygiene interventions discussed. See prescription medications

## 2012-04-23 NOTE — Patient Instructions (Addendum)
Plain Mucinex (NOT D) for thick secretions ;force NON dairy fluids .   Nasal cleansing in the shower as discussed with lather of mild shampoo.After 10 seconds wash off lather while  exhaling through nostrils. Make sure that all residual soap is removed to prevent irritation.  Fluticasone 1 spray in each nostril twice a day as needed. Use the "crossover" technique into opposite nostril spraying toward opposite ear @ 45 degree angle, not straight up into nostril.  Use a Neti pot daily only  as needed for significant sinus congestion; going from open side to congested side . Qvar 40 (sample) one inhalation every 12 hours; gargle and spit after use . Plain  Zyrtec 10 mg @ bedtime  as needed for itchy eyes & sneezing ONLY if OK with Dr Cherly Hensen.

## 2012-05-17 ENCOUNTER — Inpatient Hospital Stay (HOSPITAL_COMMUNITY)
Admission: AD | Admit: 2012-05-17 | Discharge: 2012-05-17 | Disposition: A | Discharge status: Home / Self Care | Payer: BC Managed Care – PPO | Source: Ambulatory Visit | Attending: Obstetrics and Gynecology | Admitting: Obstetrics and Gynecology

## 2012-05-17 ENCOUNTER — Inpatient Hospital Stay (HOSPITAL_COMMUNITY): Payer: BC Managed Care – PPO

## 2012-05-17 ENCOUNTER — Encounter (HOSPITAL_COMMUNITY): Payer: Self-pay | Admitting: *Deleted

## 2012-05-17 DIAGNOSIS — R109 Unspecified abdominal pain: Secondary | ICD-10-CM | POA: Insufficient documentation

## 2012-05-17 DIAGNOSIS — K5900 Constipation, unspecified: Secondary | ICD-10-CM | POA: Insufficient documentation

## 2012-05-17 DIAGNOSIS — O99891 Other specified diseases and conditions complicating pregnancy: Secondary | ICD-10-CM | POA: Insufficient documentation

## 2012-05-17 DIAGNOSIS — I10 Essential (primary) hypertension: Secondary | ICD-10-CM

## 2012-05-17 LAB — URINALYSIS, ROUTINE W REFLEX MICROSCOPIC
Bilirubin Urine: NEGATIVE
Leukocytes, UA: NEGATIVE
Nitrite: NEGATIVE
Specific Gravity, Urine: >1.03 — ABNORMAL HIGH (ref 1.005–1.030)
pH: 6 (ref 5.0–8.0)

## 2012-05-17 LAB — POCT PREGNANCY, URINE: Preg Test, Ur: POSITIVE — AB

## 2012-05-17 MED ORDER — NIFEDIPINE ER OSMOTIC RELEASE 90 MG PO TB24: 90.0000 mg | ORAL_TABLET | Freq: Every day | ORAL | Status: DC

## 2012-05-17 NOTE — MAU Note (Signed)
Pt presents with complaints of abdominal pain and it is severe when she tries to have a bowel movement. Denies any bleeding or LOF

## 2012-05-17 NOTE — MAU Provider Note (Signed)
History   Patient presented today with history of Rt sided abdomina pain . Pain started when trying to have bowel motion and was unable to have BM. No change in vaginal secretions.  Patient has history of ab x 2. Poor medical histo: Hypertension, Hyperlipidemia, PCOS, DM. CSN: [id number]  Arrival date and time: 05/17/12 0745   First Provider Initiated Contact with Patient 05/17/12 0848      Chief Complaint  Patient presents with  . Abdominal Pain   HPI  OB History   Grav Para Term Preterm Abortions TAB SAB Ect Mult Living   3 0 0 0 2 0 2 0 0 0       Past Medical History  Diagnosis Date  . Hypertension   . Hyperlipidemia   . PCOS (polycystic ovarian syndrome)   . Diabetes mellitus   . Allergic rhinitis     Past Surgical History  Procedure Laterality Date  . Breast reduction surgery  07-2003  . Invitro fertilization  11/16/2010    [facility]    Family History  Problem Relation Age of Onset  . Arthritis Mother   . Diabetes Mother   . Hypertension Mother   . Polycystic ovary syndrome Sister   . Diabetes Brother   . Hypertension Brother   . Arthritis Maternal Grandmother   . Diabetes Maternal Grandmother   . Hypertension Maternal Grandmother   . Stroke Maternal Grandfather   . Asthma Sister   . Allergies Sister     History  Substance Use Topics  . Smoking status: Never Smoker   . Smokeless tobacco: Not on file  . Alcohol Use: No    Allergies: No Known Allergies  Prescriptions prior to admission  Medication Sig Dispense Refill  . acetaminophen (TYLENOL) 500 MG tablet Take 1,000 mg by mouth every 6 (six) hours as needed for pain.      . cetirizine (ZYRTEC) 10 MG tablet Take 1 tablet (10 mg total) by mouth daily.  30 tablet  11  . Cholecalciferol (VITAMIN D3) 2000 UNITS TABS Take by mouth daily.      Marland Kitchen docusate sodium (COLACE) 50 MG capsule Take by mouth daily as needed for constipation.      . insulin detemir (LEVEMIR) 100 UNIT/ML injection Inject 10 Units  into the skin at bedtime.      . Insulin Lispro, Human, (HUMALOG PEN [place]) Inject 4-5 Units into the skin 3 (three) times daily. 4-5 units with each meal      . NIFEdipine (ADALAT CC) 90 MG 24 hr tablet Take 1 tablet (90 mg total) by mouth daily.  30 tablet  2  . Prenatal Vit-Fe Fumarate-FA (PRENAPLUS) 27-1 MG TABS TAKE 1 TABLET DAILY  90 tablet  2  . Skin Protectants, Misc. (EUCERIN) cream Apply topically as needed for wound care.  397 g  0    Review of Systems  Constitutional: Negative.   HENT: Negative.   Eyes: Negative.   Respiratory: Negative.   Cardiovascular: Negative.   Gastrointestinal: Positive for abdominal pain and constipation.       Patient has complained of Rt sided lower abdominal pain while on toilet trying to have BM. Palpation of abdomen and soft. Slight tenderness at site of round ligament Right and Left. Patient has been straining to have BM and describes " Beads of Sweat" while on Toilet.  Advised re stool softener and stool bulkers. Advised re increase in po fluids and to eat fresh fruit and vegetables to aid peristalis  Genitourinary:  Positive for frequency.       Patient has been complaining of frequency in past 2 weeks   Physical Exam   Blood pressure 144/82, pulse 79, temperature 98.1 F (36.7 C), temperature source Oral, resp. rate 20, last menstrual period 02/15/2012.  Physical Exam  Constitutional: She is oriented to person, place, and time. She appears well-developed.  Morbid Obesity  HENT:  Head: Normocephalic and atraumatic.  Right Ear: External ear normal.  Left Ear: External ear normal.  Eyes: Conjunctivae and EOM are normal. Pupils are equal, round, and reactive to light.  Neck: Normal range of motion.  Cardiovascular: Normal rate, regular rhythm and normal heart sounds.   GI: Soft.  Bowel sound Hypo active and complains of constipation and straining when trying to have BM Had been straining when trying to have BM today and had tender round  ligaments Rt and Lt from this Valsalva Maneouvre.  Genitourinary: Vagina normal and uterus normal.  Waiting of Microscopy of urine to see if UTI as had been complaining of frequency in past 2 weeks IUP at 13wd1 - Ob ultrasound limited - for variability.  Musculoskeletal: Normal range of motion.  Neurological: She is alert and oriented to person, place, and time. She has normal reflexes.  Skin: Skin is warm and dry.  Psychiatric: She has a normal mood and affect.    MAU Course  Procedures  U/A Microscopy  Limited Ob US for Viability   Assessment and Plan  Ultrasound: FHT's   158bpm     Viable IUP at [redacted]w[redacted]d Constipation: advised re increased po water/ vegetables/fruitsand can use stool softener/ stool bulkers. U/a microscopy: neg F/U Dr. Clarene Reamer 3 weeks    Kathleen Jordan, CNM 05/17/2012, 8:52 AM

## 2012-05-18 ENCOUNTER — Other Ambulatory Visit: Payer: Self-pay | Admitting: Family Medicine

## 2012-05-18 DIAGNOSIS — I10 Essential (primary) hypertension: Secondary | ICD-10-CM

## 2012-05-18 MED ORDER — NIFEDIPINE ER 90 MG PO TB24: 90.0000 mg | ORAL_TABLET | Freq: Every day | ORAL | Status: DC

## 2012-05-18 NOTE — Telephone Encounter (Signed)
Warning on medication Refill.  Management Level Qualifier Animal studies have shown teratogenic and embryocidal effects with high doses. This drug should be used during pregnancy only if the potential benefit justifies the potential risks to the fetus. FDA Category C. Use in pregnancy only if clearly needed.   Did you still want me to send it ??

## 2012-05-18 NOTE — Telephone Encounter (Signed)
refil 6 months

## 2012-05-18 NOTE — Telephone Encounter (Signed)
refill NIFEDICAL XL 60MG  tablets #30 take one tablet by mouth every day last fill 9.4.12--NOTE Womens filled NIFEdipine (PROCARDIA XL) 90 MG 24 hr tablet 1 tablet 30 05/17/2012

## 2012-05-18 NOTE — Telephone Encounter (Signed)
This patient is pregnant. Please advise     KP

## 2012-05-19 NOTE — Telephone Encounter (Signed)
I discussed with Dr.Lowne and made her aware of the concerns below and she voiced understanding, she sent the Rx electronically but When I called the pharmacy the patient had not picked up the prescription yet. I cancelled the RX and made them aware the patient is pregnant and she will need to get a new script from her GYN for her BP. The pharmacy voiced understanding and advised this was a good catch. I have tried to contact the patient to discuss her changing her med's due to pregnancy. I left a voice mail on the work phone.      KP

## 2012-05-31 ENCOUNTER — Inpatient Hospital Stay (HOSPITAL_COMMUNITY)
Admission: AD | Admit: 2012-05-31 | Discharge: 2012-05-31 | Disposition: A | Discharge status: Home / Self Care | Payer: BC Managed Care – PPO | Attending: Obstetrics & Gynecology | Admitting: Obstetrics & Gynecology

## 2012-05-31 ENCOUNTER — Encounter (HOSPITAL_COMMUNITY): Payer: Self-pay | Admitting: *Deleted

## 2012-05-31 DIAGNOSIS — K5900 Constipation, unspecified: Secondary | ICD-10-CM | POA: Insufficient documentation

## 2012-05-31 DIAGNOSIS — R109 Unspecified abdominal pain: Secondary | ICD-10-CM | POA: Insufficient documentation

## 2012-05-31 DIAGNOSIS — B3731 Acute candidiasis of vulva and vagina: Secondary | ICD-10-CM | POA: Insufficient documentation

## 2012-05-31 LAB — URINALYSIS, ROUTINE W REFLEX MICROSCOPIC
Glucose, UA: NEGATIVE mg/dL
Leukocytes, UA: NEGATIVE
Nitrite: NEGATIVE
Protein, ur: NEGATIVE mg/dL
Urobilinogen, UA: 0.2 mg/dL (ref 0.0–1.0)

## 2012-05-31 LAB — WET PREP, GENITAL: Yeast Wet Prep HPF POC: NONE SEEN

## 2012-05-31 MED ORDER — TERCONAZOLE 0.8 % VA CREA: 1.0000 | TOPICAL_CREAM | Freq: Every day | VAGINAL | Status: DC

## 2012-05-31 NOTE — MAU Provider Note (Signed)
History   Patient has presented for Left sided lower abdominal pain. Noticed  Dime"s size Spot on on pad this am. At present  [id number].    History of constipation and problems and straining with BM. Poor Ob History : AB x 2. Medica; Hx: Hypertension, Hyperlipidemia, PCOS and DM with Insulin management. At present is being treated for a sinus infection and taking Amoxicillin  500mg  po TID. Evaluation for Abdominal pain and vaginal spotting.  CSN: [id number]  Arrival date and time: 05/31/12 6578   First Provider Initiated Contact with Patient 05/31/12 [phone]      Chief Complaint  Patient presents with  . Vaginal Bleeding  . Back Pain   HPI  OB History   Grav Para Term Preterm Abortions TAB SAB Ect Mult Living   3 0 0 0 2 0 2 0 0 0       Past Medical History  Diagnosis Date  . Hypertension   . Hyperlipidemia   . PCOS (polycystic ovarian syndrome)   . Diabetes mellitus   . Allergic rhinitis     Past Surgical History  Procedure Laterality Date  . Breast reduction surgery  07-2003  . Invitro fertilization  11/16/2010    [facility]    Family History  Problem Relation Age of Onset  . Arthritis Mother   . Diabetes Mother   . Hypertension Mother   . Polycystic ovary syndrome Sister   . Diabetes Brother   . Hypertension Brother   . Arthritis Maternal Grandmother   . Diabetes Maternal Grandmother   . Hypertension Maternal Grandmother   . Stroke Maternal Grandfather   . Asthma Sister   . Allergies Sister     History  Substance Use Topics  . Smoking status: Never Smoker   . Smokeless tobacco: Not on file  . Alcohol Use: No    Allergies: No Known Allergies  Prescriptions prior to admission  Medication Sig Dispense Refill  . acetaminophen (TYLENOL) 500 MG tablet Take 1,000 mg by mouth every 6 (six) hours as needed for pain.      Marland Kitchen amoxicillin (AMOXIL) 500 MG capsule Take 500 mg by mouth 3 (three) times daily.      . cetirizine (ZYRTEC) 10 MG tablet Take 1 tablet  (10 mg total) by mouth daily.  30 tablet  11  . Cholecalciferol (VITAMIN D3) 2000 UNITS TABS Take by mouth daily.      Marland Kitchen docusate sodium (COLACE) 50 MG capsule Take by mouth daily as needed for constipation.      . insulin detemir (LEVEMIR) 100 UNIT/ML injection Inject 10 Units into the skin at bedtime.      . Insulin Lispro, Human, (HUMALOG PEN [place]) Inject 4-5 Units into the skin 3 (three) times daily. 4-5 units with each meal      . NIFEdipine (ADALAT CC) 90 MG 24 hr tablet Take 1 tablet (90 mg total) by mouth daily.  30 tablet  5  . NIFEdipine (PROCARDIA XL) 90 MG 24 hr tablet Take 1 tablet (90 mg total) by mouth daily.  1 tablet  30  . polyethylene glycol (MIRALAX / GLYCOLAX) packet Take 17 g by mouth daily.      . Prenatal Vit-Fe Fumarate-FA (PRENAPLUS) 27-1 MG TABS TAKE 1 TABLET DAILY  90 tablet  2  . senna (SENOKOT) 8.6 MG tablet Take 1 tablet by mouth daily.      . Skin Protectants, Misc. (EUCERIN) cream Apply topically as needed for wound care.  397 g  0    Review of Systems  Constitutional: Negative.   HENT: Negative.   Eyes: Negative.   Respiratory: Negative.   Cardiovascular:       Know conditions of :hypertension and Hyperlipdemnia  Gastrointestinal: Positive for abdominal pain.       Left sided  Abdominal pain  Genitourinary: Negative.   Musculoskeletal: Negative.   Skin: Negative.   Neurological: Negative.   Endo/Heme/Allergies: Negative.   Psychiatric/Behavioral: Negative.    Physical Exam   Blood pressure 133/72, pulse 94, temperature 97.4 F (36.3 C), temperature source Oral, resp. rate 20, height 5\' 8"  (1.727 m), weight 104.418 kg (230 lb 3.2 oz), last menstrual period 02/15/2012, SpO2 100.00%.  Physical Exam  Constitutional: She is oriented to person, place, and time. She appears well-developed and well-nourished.  HENT:  Head: Normocephalic and atraumatic.  Left Ear: External ear normal.  Eyes: Conjunctivae and EOM are normal. Pupils are equal, round, and  reactive to light.  Neck: Normal range of motion. Neck supple.  Cardiovascular: Normal rate, regular rhythm and normal heart sounds.   Respiratory: Effort normal and breath sounds normal.  GI: Soft. Bowel sounds are normal.  Genitourinary: Uterus normal.  Speculum Examination: Cervix visualized - closed, Pink and perfused. Vaginal walls - pink and perfused Vaginal secretions - c/w yeast infection and small amount brown blood seen in the posterior fornix. Wet Prep collected - to lab.  Musculoskeletal: Normal range of motion.  Neurological: She is alert and oriented to person, place, and time. She has normal reflexes.  Skin: Skin is warm and dry.  Psychiatric: She has a normal mood and affect.    MAU Course  Procedures  MDM Doppler FHT's : 150 bpm Wet Prep: yeast visualized on speculum examination.  Assessment and Plan  Wet Prep- c/w yeast infection Abdominal Pain - c/w with constipation advised  Glycerine suppositories to assist with BM and constipation. FHT's 150 bpm with Doppler Plan to Treat Yeast infection - Terazol vaginal cream x 3 nights Constipation - Gylcerine Suppositories F/U as scheduled with Dr Cherly Hensen 06/23/12.  DAVIES, DENISE 05/31/2012, 6:47 AM

## 2012-05-31 NOTE — MAU Note (Signed)
I saw dark red on tissue when I wiped about 0500 and having L back/buttocks pain since yesterday.

## 2012-05-31 NOTE — Progress Notes (Signed)
Spec exam done and wet prep obtained and sent

## 2012-06-05 ENCOUNTER — Inpatient Hospital Stay (HOSPITAL_COMMUNITY)
Admission: AD | Admit: 2012-06-05 | Discharge: 2012-06-05 | Disposition: A | Discharge status: Home / Self Care | Payer: BC Managed Care – PPO | Source: Ambulatory Visit | Attending: Obstetrics | Admitting: Obstetrics

## 2012-06-05 DIAGNOSIS — N949 Unspecified condition associated with female genital organs and menstrual cycle: Secondary | ICD-10-CM | POA: Insufficient documentation

## 2012-06-05 DIAGNOSIS — R109 Unspecified abdominal pain: Secondary | ICD-10-CM | POA: Insufficient documentation

## 2012-06-05 LAB — URINE MICROSCOPIC-ADD ON

## 2012-06-05 LAB — URINALYSIS, ROUTINE W REFLEX MICROSCOPIC
Bilirubin Urine: NEGATIVE
Glucose, UA: NEGATIVE mg/dL
Hgb urine dipstick: NEGATIVE
Ketones, ur: 40 mg/dL — AB
Specific Gravity, Urine: >1.03 — ABNORMAL HIGH (ref 1.005–1.030)
pH: 6 (ref 5.0–8.0)

## 2012-06-05 MED ORDER — LACTATED RINGERS IV BOLUS (SEPSIS)
1000.0000 mL | Freq: Once | INTRAVENOUS | Status: AC
  Administered 2012-06-05: 1000 mL via INTRAVENOUS

## 2012-06-05 MED ORDER — FLUCONAZOLE 150 MG PO TABS: 150.0000 mg | ORAL_TABLET | Freq: Every day | ORAL | Status: AC

## 2012-06-05 MED ORDER — FAMOTIDINE IN NACL 20-0.9 MG/50ML-% IV SOLN
20.0000 mg | Freq: Once | INTRAVENOUS | Status: AC
  Administered 2012-06-05: 20 mg via INTRAVENOUS
  Filled 2012-06-05: qty 50

## 2012-06-05 MED ORDER — FLEET ENEMA 7-19 GM/118ML RE ENEM
1.0000 | ENEMA | Freq: Once | RECTAL | Status: AC
  Administered 2012-06-05: 1 via RECTAL

## 2012-06-05 NOTE — MAU Note (Signed)
Patient is in with c/o abdominal pain and pelvic pain since last night. She denies any vaginal bleeding or abnormal vaginal discharge.

## 2012-06-05 NOTE — H&P (Signed)
CC: abd pain  HPI: 42 yo G3P0020 (MAB x 2, 4 and 9 wks).  Desired preg, conceived w/ the help of Clomid. T2DM, Last A1C 5.6. On Humalog and Levamit. Also w/ PCOS, has stopped metformin. Normal ultrascreen.   LLQ pain since yest. Worse when she lays down. A little better when sitting up. Unable to sleep last. Emesis as well. Patient notes very limited oral intake over the past 24 hours. Last BM yest. No blood in stool. No dysuria. Patient states no right sided pain. Pain is only left lower cautery and with some radiation into the left flank and down the left leg.  Patient notes no history of nephrolithiasis.  Meds: insulin, PNV, sennakot, colace, miralax, nifedipine  PMH: PCOS, obesity, DM, infertility, htn, hyperchol  PObHx: MAB x 2, 4 wks- spont passage, 9 wks- D&C.   Physical exam Filed Vitals:   06/05/12 1708 06/05/12 1724  BP:  136/73  Pulse:  75  Temp:  98.2 F (36.8 C)  TempSrc:  Oral  Resp:  18  Height: 5\' 8"  (1.727 m)   Weight: 105.802 kg (233 lb 4 oz)    General: Morbidly obese no acute distress but does appear in mild discomfort. Sitting on the edge of the bed. Cardiovascular: Regular rate and rhythm Pulmonary: Clear to auscultation bilaterally Back: No costovertebral angle tenderness Abdomen: Morbidly obese, nontender, nondistended, no right upper quadrant pain, no palpable masses in the bilateral lower quadrants, no significant tenderness GU normal external genitalia, normal vagina, normal cervix, cervix long, closed, no cervical motion tenderness, no adnexal masses or adnexal tenderness, no uterine tenderness, uterus appropriate size of note exam is limited due to patient's body habitus. Patient does have tenderness when pushing on the rectum through the rectovaginal wall.  Active FH by doppler Wet prep: Normal epithelial cells, no bacterial vaginosis, scant white blood cells, frequent yeast hyphae, no trichomonas  Assessment and plan: 42 year old G3 P0 at 15 weeks  with left lower quadrant pain most likely secondary to constipation - Constipation. Will give fleets enema to assist with bowel movement. Patient continue on oral meds at home. Improve hydration - Dehydration. Likely impacting her constipation. Given her urinalysis Will hydrate with 2 L of normal saline - Type 2 diabetes. Continue home insulin regimen. Will check blood sugar while here in MAU - Yeast vaginitis Will give treatment of Diflucan for use at home - Fetal well being. Active FH. Followup in office for routine assessment.  FOGLEMAN,KELLY A. 06/05/2012 6:01 PM

## 2012-06-05 NOTE — Progress Notes (Signed)
Pt c/o constipation. 

## 2012-06-05 NOTE — MAU Note (Signed)
Onset of lower abdominal pain since yesterday constant laying down makes it worse.

## 2012-06-11 ENCOUNTER — Encounter: Payer: Self-pay | Admitting: Family Medicine

## 2012-06-11 ENCOUNTER — Ambulatory Visit (INDEPENDENT_AMBULATORY_CARE_PROVIDER_SITE_OTHER): Payer: BC Managed Care – PPO | Admitting: Family Medicine

## 2012-06-11 VITALS — BP 118/72 | HR 76 | Temp 99.4°F | Ht 68.0 in | Wt 231.4 lb

## 2012-06-11 DIAGNOSIS — I10 Essential (primary) hypertension: Secondary | ICD-10-CM

## 2012-06-11 DIAGNOSIS — Z0000 Encounter for general adult medical examination without abnormal findings: Secondary | ICD-10-CM

## 2012-06-11 DIAGNOSIS — R748 Abnormal levels of other serum enzymes: Secondary | ICD-10-CM

## 2012-06-11 DIAGNOSIS — E785 Hyperlipidemia, unspecified: Secondary | ICD-10-CM

## 2012-06-11 LAB — HEPATIC FUNCTION PANEL
ALT: 17 U/L (ref 0–35)
Albumin: 3.3 g/dL — ABNORMAL LOW (ref 3.5–5.2)
Bilirubin, Direct: 0 mg/dL (ref 0.0–0.3)
Total Protein: 7 g/dL (ref 6.0–8.3)

## 2012-06-11 MED ORDER — NIFEDIPINE ER OSMOTIC RELEASE 30 MG PO TB24: ORAL_TABLET | ORAL | Status: DC

## 2012-06-11 MED ORDER — METHYLDOPA 250 MG PO TABS: 250.0000 mg | ORAL_TABLET | Freq: Three times a day (TID) | ORAL | Status: DC

## 2012-06-11 NOTE — Progress Notes (Signed)
Subjective:     Kathleen Jordan is a 42 y.o. female and is here for a comprehensive physical exam. The patient reports no problems.  History   Social History  . Marital Status: Married    Spouse Name: Caryn Bee    Number of Children: 0  . Years of Education: N/A   Occupational History  . Psychotherapist     [employer]   Social History Main Topics  . Smoking status: Never Smoker   . Smokeless tobacco: Not on file  . Alcohol Use: No  . Drug Use: No  . Sexually Active: Yes -- Female partner(s)   Other Topics Concern  . Not on file   Social History Narrative  . No narrative on file   Health Maintenance  Topic Date Due  . Tetanus/tdap  04/01/2010  . Influenza Vaccine  11/30/2012  . Pap Smear  12/12/2013    The following portions of the patient's history were reviewed and updated as appropriate:  She  has a past medical history of Hypertension; Hyperlipidemia; PCOS (polycystic ovarian syndrome); Diabetes mellitus; and Allergic rhinitis. She  does not have any pertinent problems on file. She  has past surgical history that includes Breast reduction surgery (07-2003) and invitro fertilization (11/16/2010). Her family history includes Allergies in her sister; Arthritis in her maternal grandmother and mother; Asthma in her sister; Diabetes in her brother, maternal grandmother, and mother; Heart disease (age of onset: 10) in her mother; Hypertension in her brother, maternal grandmother, and mother; Polycystic ovary syndrome in her sister; and Stroke in her maternal grandfather. She  reports that she has never smoked. She does not have any smokeless tobacco history on file. She reports that she does not drink alcohol or use illicit drugs. She has a current medication list which includes the following prescription(s): acetaminophen, cetirizine, vitamin d3, docusate sodium, fluconazole, insulin aspart, insulin detemir, nifedipine, polyethylene glycol, prenaplus,  senna, and eucerin. Current Outpatient Prescriptions on File Prior to Visit  Medication Sig Dispense Refill  . acetaminophen (TYLENOL) 500 MG tablet Take 1,000 mg by mouth every 6 (six) hours as needed for pain.      . cetirizine (ZYRTEC) 10 MG tablet Take 1 tablet (10 mg total) by mouth daily.  30 tablet  11  . Cholecalciferol (VITAMIN D3) 2000 UNITS TABS Take by mouth daily.      Marland Kitchen docusate sodium (COLACE) 50 MG capsule Take by mouth daily as needed for constipation.      . fluconazole (DIFLUCAN) 150 MG tablet Take 1 tablet (150 mg total) by mouth daily.  3 tablet  0  . insulin detemir (LEVEMIR) 100 UNIT/ML injection Inject 20 Units into the skin at bedtime.       Marland Kitchen NIFEdipine (PROCARDIA XL) 90 MG 24 hr tablet Take 1 tablet (90 mg total) by mouth daily.  1 tablet  30  . polyethylene glycol (MIRALAX / GLYCOLAX) packet Take 17 g by mouth daily.      . Prenatal Vit-Fe Fumarate-FA (PRENAPLUS) 27-1 MG TABS TAKE 1 TABLET DAILY  90 tablet  2  . senna (SENOKOT) 8.6 MG tablet Take 1 tablet by mouth daily.      . Skin Protectants, Misc. (EUCERIN) cream Apply topically as needed for wound care.  397 g  0   No current facility-administered medications on file prior to visit.   She has No Known Allergies..  Review of Systems Review of Systems  Constitutional: Negative for activity change, appetite change and  fatigue.  HENT: Negative for hearing loss, congestion, tinnitus and ear discharge.  dentist q74m Eyes: Negative for visual disturbance (see optho q1y -- vision corrected to 20/20 with glasses).  Respiratory: Negative for cough, chest tightness and shortness of breath.   Cardiovascular: Negative for chest pain, palpitations and leg swelling.  Gastrointestinal: Negative for abdominal pain, diarrhea, constipation and abdominal distention.  Genitourinary: Negative for urgency, frequency, decreased urine volume and difficulty urinating.  Musculoskeletal: Negative for back pain, arthralgias and gait  problem.  Skin: Negative for color change, pallor and rash.  Neurological: Negative for dizziness, light-headedness, numbness and headaches.  Hematological: Negative for adenopathy. Does not bruise/bleed easily.  Psychiatric/Behavioral: Negative for suicidal ideas, confusion, sleep disturbance, self-injury, dysphoric mood, decreased concentration and agitation.       Objective:    BP 118/72  Pulse 76  Temp(Src) 99.4 F (37.4 C) (Oral)  Ht 5\' 8"  (1.727 m)  Wt 231 lb 6.4 oz (104.962 kg)  BMI 35.19 kg/m2  SpO2 98%  LMP 02/15/2012 General appearance: alert, cooperative, appears stated age and no distress Head: Normocephalic, without obvious abnormality, atraumatic Eyes: conjunctivae/corneas clear. PERRL, EOM's intact. Fundi benign. Ears: normal TM's and external ear canals both ears Nose: Nares normal. Septum midline. Mucosa normal. No drainage or sinus tenderness. Throat: lips, mucosa, and tongue normal; teeth and gums normal Neck: no adenopathy, no carotid bruit, no JVD, supple, symmetrical, trachea midline and thyroid not enlarged, symmetric, no tenderness/mass/nodules Back: symmetric, no curvature. ROM normal. No CVA tenderness. Lungs: clear to auscultation bilaterally Breasts: gyn Heart: regular rate and rhythm, S1, S2 normal, no murmur, click, rub or gallop Abdomen: soft, non-tender; bowel sounds normal; no masses,  no organomegaly Pelvic: deferred---gyn Extremities: extremities normal, atraumatic, no cyanosis or edema Pulses: 2+ and symmetric Skin: Skin color, texture, turgor normal. No rashes or lesions Lymph nodes: Cervical, supraclavicular, and axillary nodes normal. Neurologic: Alert and oriented X 3, normal strength and tone. Normal symmetric reflexes. Normal coordination and gait Psych--  No depression, no anxiety      Assessment:    Healthy female exam.     Plan:    ghm utd Check labs See After Visit Summary for Counseling Recommendations

## 2012-06-11 NOTE — Assessment & Plan Note (Signed)
Off meds secondary to pregnancy and elevated LFT

## 2012-06-11 NOTE — Patient Instructions (Addendum)
Preventive Care for Adults, Female A healthy lifestyle and preventive care can promote health and wellness. Preventive health guidelines for women include the following key practices.  A routine yearly physical is a good way to check with your caregiver about your health and preventive screening. It is a chance to share any concerns and updates on your health, and to receive a thorough exam.  Visit your dentist for a routine exam and preventive care every 6 months. Brush your teeth twice a day and floss once a day. Good oral hygiene prevents tooth decay and gum disease.  The frequency of eye exams is based on your age, health, family medical history, use of contact lenses, and other factors. Follow your caregiver's recommendations for frequency of eye exams.  Eat a healthy diet. Foods like vegetables, fruits, whole grains, low-fat dairy products, and lean protein foods contain the nutrients you need without too many calories. Decrease your intake of foods high in solid fats, added sugars, and salt. Eat the right amount of calories for you.Get information about a proper diet from your caregiver, if necessary.  Regular physical exercise is one of the most important things you can do for your health. Most adults should get at least 150 minutes of moderate-intensity exercise (any activity that increases your heart rate and causes you to sweat) each week. In addition, most adults need muscle-strengthening exercises on 2 or more days a week.  Maintain a healthy weight. The body mass index (BMI) is a screening tool to identify possible weight problems. It provides an estimate of body fat based on height and weight. Your caregiver can help determine your BMI, and can help you achieve or maintain a healthy weight.For adults 20 years and older:  A BMI below 18.5 is considered underweight.  A BMI of 18.5 to 24.9 is normal.  A BMI of 25 to 29.9 is considered overweight.  A BMI of 30 and above is  considered obese.  Maintain normal blood lipids and cholesterol levels by exercising and minimizing your intake of saturated fat. Eat a balanced diet with plenty of fruit and vegetables. Blood tests for lipids and cholesterol should begin at age 20 and be repeated every 5 years. If your lipid or cholesterol levels are high, you are over 50, or you are at high risk for heart disease, you may need your cholesterol levels checked more frequently.Ongoing high lipid and cholesterol levels should be treated with medicines if diet and exercise are not effective.  If you smoke, find out from your caregiver how to quit. If you do not use tobacco, do not start.  If you are pregnant, do not drink alcohol. If you are breastfeeding, be very cautious about drinking alcohol. If you are not pregnant and choose to drink alcohol, do not exceed 1 drink per day. One drink is considered to be 12 ounces (355 mL) of beer, 5 ounces (148 mL) of wine, or 1.5 ounces (44 mL) of liquor.  Avoid use of street drugs. Do not share needles with anyone. Ask for help if you need support or instructions about stopping the use of drugs.  High blood pressure causes heart disease and increases the risk of stroke. Your blood pressure should be checked at least every 1 to 2 years. Ongoing high blood pressure should be treated with medicines if weight loss and exercise are not effective.  If you are 55 to 40 years old, ask your caregiver if you should take aspirin to prevent strokes.  Diabetes   screening involves taking a blood sample to check your fasting blood sugar level. This should be done once every 3 years, after age 45, if you are within normal weight and without risk factors for diabetes. Testing should be considered at a younger age or be carried out more frequently if you are overweight and have at least 1 risk factor for diabetes.  Breast cancer screening is essential preventive care for women. You should practice "breast  self-awareness." This means understanding the normal appearance and feel of your breasts and may include breast self-examination. Any changes detected, no matter how small, should be reported to a caregiver. Women in their 20s and 30s should have a clinical breast exam (CBE) by a caregiver as part of a regular health exam every 1 to 3 years. After age 40, women should have a CBE every year. Starting at age 40, women should consider having a mammography (breast X-ray test) every year. Women who have a family history of breast cancer should talk to their caregiver about genetic screening. Women at a high risk of breast cancer should talk to their caregivers about having magnetic resonance imaging (MRI) and a mammography every year.  The Pap test is a screening test for cervical cancer. A Pap test can show cell changes on the cervix that might become cervical cancer if left untreated. A Pap test is a procedure in which cells are obtained and examined from the lower end of the uterus (cervix).  Women should have a Pap test starting at age 21.  Between ages 21 and 29, Pap tests should be repeated every 2 years.  Beginning at age 30, you should have a Pap test every 3 years as long as the past 3 Pap tests have been normal.  Some women have medical problems that increase the chance of getting cervical cancer. Talk to your caregiver about these problems. It is especially important to talk to your caregiver if a new problem develops soon after your last Pap test. In these cases, your caregiver may recommend more frequent screening and Pap tests.  The above recommendations are the same for women who have or have not gotten the vaccine for human papillomavirus (HPV).  If you had a hysterectomy for a problem that was not cancer or a condition that could lead to cancer, then you no longer need Pap tests. Even if you no longer need a Pap test, a regular exam is a good idea to make sure no other problems are  starting.  If you are between ages 65 and 70, and you have had normal Pap tests going back 10 years, you no longer need Pap tests. Even if you no longer need a Pap test, a regular exam is a good idea to make sure no other problems are starting.  If you have had past treatment for cervical cancer or a condition that could lead to cancer, you need Pap tests and screening for cancer for at least 20 years after your treatment.  If Pap tests have been discontinued, risk factors (such as a new sexual partner) need to be reassessed to determine if screening should be resumed.  The HPV test is an additional test that may be used for cervical cancer screening. The HPV test looks for the virus that can cause the cell changes on the cervix. The cells collected during the Pap test can be tested for HPV. The HPV test could be used to screen women aged 30 years and older, and should   be used in women of any age who have unclear Pap test results. After the age of 30, women should have HPV testing at the same frequency as a Pap test.  Colorectal cancer can be detected and often prevented. Most routine colorectal cancer screening begins at the age of 50 and continues through age 75. However, your caregiver may recommend screening at an earlier age if you have risk factors for colon cancer. On a yearly basis, your caregiver may provide home test kits to check for hidden blood in the stool. Use of a small camera at the end of a tube, to directly examine the colon (sigmoidoscopy or colonoscopy), can detect the earliest forms of colorectal cancer. Talk to your caregiver about this at age 50, when routine screening begins. Direct examination of the colon should be repeated every 5 to 10 years through age 75, unless early forms of pre-cancerous polyps or small growths are found.  Hepatitis C blood testing is recommended for all people born from 1945 through 1965 and any individual with known risks for hepatitis C.  Practice  safe sex. Use condoms and avoid high-risk sexual practices to reduce the spread of sexually transmitted infections (STIs). STIs include gonorrhea, chlamydia, syphilis, trichomonas, herpes, HPV, and human immunodeficiency virus (HIV). Herpes, HIV, and HPV are viral illnesses that have no cure. They can result in disability, cancer, and death. Sexually active women aged 25 and younger should be checked for chlamydia. Older women with new or multiple partners should also be tested for chlamydia. Testing for other STIs is recommended if you are sexually active and at increased risk.  Osteoporosis is a disease in which the bones lose minerals and strength with aging. This can result in serious bone fractures. The risk of osteoporosis can be identified using a bone density scan. Women ages 65 and over and women at risk for fractures or osteoporosis should discuss screening with their caregivers. Ask your caregiver whether you should take a calcium supplement or vitamin D to reduce the rate of osteoporosis.  Menopause can be associated with physical symptoms and risks. Hormone replacement therapy is available to decrease symptoms and risks. You should talk to your caregiver about whether hormone replacement therapy is right for you.  Use sunscreen with sun protection factor (SPF) of 30 or more. Apply sunscreen liberally and repeatedly throughout the day. You should seek shade when your shadow is shorter than you. Protect yourself by wearing long sleeves, pants, a wide-brimmed hat, and sunglasses year round, whenever you are outdoors.  Once a month, do a whole body skin exam, using a mirror to look at the skin on your back. Notify your caregiver of new moles, moles that have irregular borders, moles that are larger than a pencil eraser, or moles that have changed in shape or color.  Stay current with required immunizations.  Influenza. You need a dose every fall (or winter). The composition of the flu vaccine  changes each year, so being vaccinated once is not enough.  Pneumococcal polysaccharide. You need 1 to 2 doses if you smoke cigarettes or if you have certain chronic medical conditions. You need 1 dose at age 65 (or older) if you have never been vaccinated.  Tetanus, diphtheria, pertussis (Tdap, Td). Get 1 dose of Tdap vaccine if you are younger than age 65, are over 65 and have contact with an infant, are a healthcare worker, are pregnant, or simply want to be protected from whooping cough. After that, you need a Td   booster dose every 10 years. Consult your caregiver if you have not had at least 3 tetanus and diphtheria-containing shots sometime in your life or have a deep or dirty wound.  HPV. You need this vaccine if you are a woman age 26 or younger. The vaccine is given in 3 doses over 6 months.  Measles, mumps, rubella (MMR). You need at least 1 dose of MMR if you were born in 1957 or later. You may also need a second dose.  Meningococcal. If you are age 19 to 21 and a first-year college student living in a residence hall, or have one of several medical conditions, you need to get vaccinated against meningococcal disease. You may also need additional booster doses.  Zoster (shingles). If you are age 60 or older, you should get this vaccine.  Varicella (chickenpox). If you have never had chickenpox or you were vaccinated but received only 1 dose, talk to your caregiver to find out if you need this vaccine.  Hepatitis A. You need this vaccine if you have a specific risk factor for hepatitis A virus infection or you simply wish to be protected from this disease. The vaccine is usually given as 2 doses, 6 to 18 months apart.  Hepatitis B. You need this vaccine if you have a specific risk factor for hepatitis B virus infection or you simply wish to be protected from this disease. The vaccine is given in 3 doses, usually over 6 months. Preventive Services / Frequency Ages 19 to 39  Blood  pressure check.** / Every 1 to 2 years.  Lipid and cholesterol check.** / Every 5 years beginning at age 20.  Clinical breast exam.** / Every 3 years for women in their 20s and 30s.  Pap test.** / Every 2 years from ages 21 through 29. Every 3 years starting at age 30 through age 65 or 70 with a history of 3 consecutive normal Pap tests.  HPV screening.** / Every 3 years from ages 30 through ages 65 to 70 with a history of 3 consecutive normal Pap tests.  Hepatitis C blood test.** / For any individual with known risks for hepatitis C.  Skin self-exam. / Monthly.  Influenza immunization.** / Every year.  Pneumococcal polysaccharide immunization.** / 1 to 2 doses if you smoke cigarettes or if you have certain chronic medical conditions.  Tetanus, diphtheria, pertussis (Tdap, Td) immunization. / A one-time dose of Tdap vaccine. After that, you need a Td booster dose every 10 years.  HPV immunization. / 3 doses over 6 months, if you are 26 and younger.  Measles, mumps, rubella (MMR) immunization. / You need at least 1 dose of MMR if you were born in 1957 or later. You may also need a second dose.  Meningococcal immunization. / 1 dose if you are age 19 to 21 and a first-year college student living in a residence hall, or have one of several medical conditions, you need to get vaccinated against meningococcal disease. You may also need additional booster doses.  Varicella immunization.** / Consult your caregiver.  Hepatitis A immunization.** / Consult your caregiver. 2 doses, 6 to 18 months apart.  Hepatitis B immunization.** / Consult your caregiver. 3 doses usually over 6 months. Ages 40 to 64  Blood pressure check.** / Every 1 to 2 years.  Lipid and cholesterol check.** / Every 5 years beginning at age 20.  Clinical breast exam.** / Every year after age 40.  Mammogram.** / Every year beginning at age 40   and continuing for as long as you are in good health. Consult with your  caregiver.  Pap test.** / Every 3 years starting at age 30 through age 65 or 70 with a history of 3 consecutive normal Pap tests.  HPV screening.** / Every 3 years from ages 30 through ages 65 to 70 with a history of 3 consecutive normal Pap tests.  Fecal occult blood test (FOBT) of stool. / Every year beginning at age 50 and continuing until age 75. You may not need to do this test if you get a colonoscopy every 10 years.  Flexible sigmoidoscopy or colonoscopy.** / Every 5 years for a flexible sigmoidoscopy or every 10 years for a colonoscopy beginning at age 50 and continuing until age 75.  Hepatitis C blood test.** / For all people born from 1945 through 1965 and any individual with known risks for hepatitis C.  Skin self-exam. / Monthly.  Influenza immunization.** / Every year.  Pneumococcal polysaccharide immunization.** / 1 to 2 doses if you smoke cigarettes or if you have certain chronic medical conditions.  Tetanus, diphtheria, pertussis (Tdap, Td) immunization.** / A one-time dose of Tdap vaccine. After that, you need a Td booster dose every 10 years.  Measles, mumps, rubella (MMR) immunization. / You need at least 1 dose of MMR if you were born in 1957 or later. You may also need a second dose.  Varicella immunization.** / Consult your caregiver.  Meningococcal immunization.** / Consult your caregiver.  Hepatitis A immunization.** / Consult your caregiver. 2 doses, 6 to 18 months apart.  Hepatitis B immunization.** / Consult your caregiver. 3 doses, usually over 6 months. Ages 65 and over  Blood pressure check.** / Every 1 to 2 years.  Lipid and cholesterol check.** / Every 5 years beginning at age 20.  Clinical breast exam.** / Every year after age 40.  Mammogram.** / Every year beginning at age 40 and continuing for as long as you are in good health. Consult with your caregiver.  Pap test.** / Every 3 years starting at age 30 through age 65 or 70 with a 3  consecutive normal Pap tests. Testing can be stopped between 65 and 70 with 3 consecutive normal Pap tests and no abnormal Pap or HPV tests in the past 10 years.  HPV screening.** / Every 3 years from ages 30 through ages 65 or 70 with a history of 3 consecutive normal Pap tests. Testing can be stopped between 65 and 70 with 3 consecutive normal Pap tests and no abnormal Pap or HPV tests in the past 10 years.  Fecal occult blood test (FOBT) of stool. / Every year beginning at age 50 and continuing until age 75. You may not need to do this test if you get a colonoscopy every 10 years.  Flexible sigmoidoscopy or colonoscopy.** / Every 5 years for a flexible sigmoidoscopy or every 10 years for a colonoscopy beginning at age 50 and continuing until age 75.  Hepatitis C blood test.** / For all people born from 1945 through 1965 and any individual with known risks for hepatitis C.  Osteoporosis screening.** / A one-time screening for women ages 65 and over and women at risk for fractures or osteoporosis.  Skin self-exam. / Monthly.  Influenza immunization.** / Every year.  Pneumococcal polysaccharide immunization.** / 1 dose at age 65 (or older) if you have never been vaccinated.  Tetanus, diphtheria, pertussis (Tdap, Td) immunization. / A one-time dose of Tdap vaccine if you are over   65 and have contact with an infant, are a healthcare worker, or simply want to be protected from whooping cough. After that, you need a Td booster dose every 10 years.  Varicella immunization.** / Consult your caregiver.  Meningococcal immunization.** / Consult your caregiver.  Hepatitis A immunization.** / Consult your caregiver. 2 doses, 6 to 18 months apart.  Hepatitis B immunization.** / Check with your caregiver. 3 doses, usually over 6 months. ** Family history and personal history of risk and conditions may change your caregiver's recommendations. Document Released: 05/14/2001 Document Revised: 06/10/2011  Document Reviewed: 08/13/2010 [employer] Patient Information 2013 [employer].                                                         Preventive Care for Adults, Female A healthy lifestyle and preventive care can promote health and wellness. Preventive health guidelines for women include the following key practices.  A routine yearly physical is a good way to check with your caregiver about your health and preventive screening. It is a chance to share any concerns and updates on your health, and to receive a thorough exam.  Visit your dentist for a routine exam and preventive care every 6 months. Brush your teeth twice a day and floss once a day. Good oral hygiene prevents tooth decay and gum disease.  The frequency of eye exams is based on your age, health, family medical history, use of contact lenses, and other factors. Follow your caregiver's recommendations for frequency of eye exams.  Eat a healthy diet. Foods like vegetables, fruits, whole grains, low-fat dairy products, and lean protein foods contain the nutrients you need without too many calories. Decrease your intake of foods high in solid fats, added sugars, and salt. Eat the right amount of calories for you.Get information about a proper diet from your caregiver, if necessary.  Regular physical exercise is one of the most important things you can do for your health. Most adults should get at least 150 minutes of moderate-intensity exercise (any activity that increases your heart rate and causes you to sweat) each week. In addition, most adults need muscle-strengthening exercises on 2 or more days a week.  Maintain a healthy weight. The body mass index (BMI) is a screening tool to identify possible weight problems. It provides an estimate of body fat based on height and weight. Your caregiver can help determine your BMI, and can help you achieve or maintain a healthy weight.For  adults 20 years and older:  A BMI below 18.5 is considered underweight.  A BMI of 18.5 to 24.9 is normal.  A BMI of 25 to 29.9 is considered overweight.  A BMI of 30 and above is considered obese.  Maintain normal blood lipids and cholesterol levels by exercising and minimizing your intake of saturated fat. Eat a balanced diet with plenty of fruit and vegetables. Blood tests for lipids and cholesterol should begin at age 20 and be repeated every 5 years. If your lipid or cholesterol levels are high, you are over 50, or you are at high risk for heart disease, you may need your cholesterol levels checked more frequently.Ongoing high lipid and cholesterol levels should be treated with medicines if diet and exercise are not effective.  If you smoke, find out from your caregiver how to quit.   If you do not use tobacco, do not start.  If you are pregnant, do not drink alcohol. If you are breastfeeding, be very cautious about drinking alcohol. If you are not pregnant and choose to drink alcohol, do not exceed 1 drink per day. One drink is considered to be 12 ounces (355 mL) of beer, 5 ounces (148 mL) of wine, or 1.5 ounces (44 mL) of liquor.  Avoid use of street drugs. Do not share needles with anyone. Ask for help if you need support or instructions about stopping the use of drugs.  High blood pressure causes heart disease and increases the risk of stroke. Your blood pressure should be checked at least every 1 to 2 years. Ongoing high blood pressure should be treated with medicines if weight loss and exercise are not effective.  If you are 55 to 40 years old, ask your caregiver if you should take aspirin to prevent strokes.  Diabetes screening involves taking a blood sample to check your fasting blood sugar level. This should be done once every 3 years, after age 45, if you are within normal weight and without risk factors for diabetes. Testing should be considered at a younger age or be carried out  more frequently if you are overweight and have at least 1 risk factor for diabetes.  Breast cancer screening is essential preventive care for women. You should practice "breast self-awareness." This means understanding the normal appearance and feel of your breasts and may include breast self-examination. Any changes detected, no matter how small, should be reported to a caregiver. Women in their 20s and 30s should have a clinical breast exam (CBE) by a caregiver as part of a regular health exam every 1 to 3 years. After age 40, women should have a CBE every year. Starting at age 40, women should consider having a mammography (breast X-ray test) every year. Women who have a family history of breast cancer should talk to their caregiver about genetic screening. Women at a high risk of breast cancer should talk to their caregivers about having magnetic resonance imaging (MRI) and a mammography every year.  The Pap test is a screening test for cervical cancer. A Pap test can show cell changes on the cervix that might become cervical cancer if left untreated. A Pap test is a procedure in which cells are obtained and examined from the lower end of the uterus (cervix).  Women should have a Pap test starting at age 21.  Between ages 21 and 29, Pap tests should be repeated every 2 years.  Beginning at age 30, you should have a Pap test every 3 years as long as the past 3 Pap tests have been normal.  Some women have medical problems that increase the chance of getting cervical cancer. Talk to your caregiver about these problems. It is especially important to talk to your caregiver if a new problem develops soon after your last Pap test. In these cases, your caregiver may recommend more frequent screening and Pap tests.  The above recommendations are the same for women who have or have not gotten the vaccine for human papillomavirus (HPV).  If you had a hysterectomy for a problem that was not cancer or a  condition that could lead to cancer, then you no longer need Pap tests. Even if you no longer need a Pap test, a regular exam is a good idea to make sure no other problems are starting.  If you are between ages 65 and   70, and you have had normal Pap tests going back 10 years, you no longer need Pap tests. Even if you no longer need a Pap test, a regular exam is a good idea to make sure no other problems are starting.  If you have had past treatment for cervical cancer or a condition that could lead to cancer, you need Pap tests and screening for cancer for at least 20 years after your treatment.  If Pap tests have been discontinued, risk factors (such as a new sexual partner) need to be reassessed to determine if screening should be resumed.  The HPV test is an additional test that may be used for cervical cancer screening. The HPV test looks for the virus that can cause the cell changes on the cervix. The cells collected during the Pap test can be tested for HPV. The HPV test could be used to screen women aged 30 years and older, and should be used in women of any age who have unclear Pap test results. After the age of 30, women should have HPV testing at the same frequency as a Pap test.  Colorectal cancer can be detected and often prevented. Most routine colorectal cancer screening begins at the age of 50 and continues through age 75. However, your caregiver may recommend screening at an earlier age if you have risk factors for colon cancer. On a yearly basis, your caregiver may provide home test kits to check for hidden blood in the stool. Use of a small camera at the end of a tube, to directly examine the colon (sigmoidoscopy or colonoscopy), can detect the earliest forms of colorectal cancer. Talk to your caregiver about this at age 50, when routine screening begins. Direct examination of the colon should be repeated every 5 to 10 years through age 75, unless early forms of pre-cancerous polyps or  small growths are found.  Hepatitis C blood testing is recommended for all people born from 1945 through 1965 and any individual with known risks for hepatitis C.  Practice safe sex. Use condoms and avoid high-risk sexual practices to reduce the spread of sexually transmitted infections (STIs). STIs include gonorrhea, chlamydia, syphilis, trichomonas, herpes, HPV, and human immunodeficiency virus (HIV). Herpes, HIV, and HPV are viral illnesses that have no cure. They can result in disability, cancer, and death. Sexually active women aged 25 and younger should be checked for chlamydia. Older women with new or multiple partners should also be tested for chlamydia. Testing for other STIs is recommended if you are sexually active and at increased risk.  Osteoporosis is a disease in which the bones lose minerals and strength with aging. This can result in serious bone fractures. The risk of osteoporosis can be identified using a bone density scan. Women ages 65 and over and women at risk for fractures or osteoporosis should discuss screening with their caregivers. Ask your caregiver whether you should take a calcium supplement or vitamin D to reduce the rate of osteoporosis.  Menopause can be associated with physical symptoms and risks. Hormone replacement therapy is available to decrease symptoms and risks. You should talk to your caregiver about whether hormone replacement therapy is right for you.  Use sunscreen with sun protection factor (SPF) of 30 or more. Apply sunscreen liberally and repeatedly throughout the day. You should seek shade when your shadow is shorter than you. Protect yourself by wearing long sleeves, pants, a wide-brimmed hat, and sunglasses year round, whenever you are outdoors.  Once a month, do   a whole body skin exam, using a mirror to look at the skin on your back. Notify your caregiver of new moles, moles that have irregular borders, moles that are larger than a pencil eraser, or  moles that have changed in shape or color.  Stay current with required immunizations.  Influenza. You need a dose every fall (or winter). The composition of the flu vaccine changes each year, so being vaccinated once is not enough.  Pneumococcal polysaccharide. You need 1 to 2 doses if you smoke cigarettes or if you have certain chronic medical conditions. You need 1 dose at age 65 (or older) if you have never been vaccinated.  Tetanus, diphtheria, pertussis (Tdap, Td). Get 1 dose of Tdap vaccine if you are younger than age 65, are over 65 and have contact with an infant, are a healthcare worker, are pregnant, or simply want to be protected from whooping cough. After that, you need a Td booster dose every 10 years. Consult your caregiver if you have not had at least 3 tetanus and diphtheria-containing shots sometime in your life or have a deep or dirty wound.  HPV. You need this vaccine if you are a woman age 26 or younger. The vaccine is given in 3 doses over 6 months.  Measles, mumps, rubella (MMR). You need at least 1 dose of MMR if you were born in 1957 or later. You may also need a second dose.  Meningococcal. If you are age 19 to 21 and a first-year college student living in a residence hall, or have one of several medical conditions, you need to get vaccinated against meningococcal disease. You may also need additional booster doses.  Zoster (shingles). If you are age 60 or older, you should get this vaccine.  Varicella (chickenpox). If you have never had chickenpox or you were vaccinated but received only 1 dose, talk to your caregiver to find out if you need this vaccine.  Hepatitis A. You need this vaccine if you have a specific risk factor for hepatitis A virus infection or you simply wish to be protected from this disease. The vaccine is usually given as 2 doses, 6 to 18 months apart.  Hepatitis B. You need this vaccine if you have a specific risk factor for hepatitis B virus  infection or you simply wish to be protected from this disease. The vaccine is given in 3 doses, usually over 6 months. Preventive Services / Frequency Ages 19 to 39  Blood pressure check.** / Every 1 to 2 years.  Lipid and cholesterol check.** / Every 5 years beginning at age 20.  Clinical breast exam.** / Every 3 years for women in their 20s and 30s.  Pap test.** / Every 2 years from ages 21 through 29. Every 3 years starting at age 30 through age 65 or 70 with a history of 3 consecutive normal Pap tests.  HPV screening.** / Every 3 years from ages 30 through ages 65 to 70 with a history of 3 consecutive normal Pap tests.  Hepatitis C blood test.** / For any individual with known risks for hepatitis C.  Skin self-exam. / Monthly.  Influenza immunization.** / Every year.  Pneumococcal polysaccharide immunization.** / 1 to 2 doses if you smoke cigarettes or if you have certain chronic medical conditions.  Tetanus, diphtheria, pertussis (Tdap, Td) immunization. / A one-time dose of Tdap vaccine. After that, you need a Td booster dose every 10 years.  HPV immunization. / 3 doses over 6 months, if   you are 26 and younger.  Measles, mumps, rubella (MMR) immunization. / You need at least 1 dose of MMR if you were born in 1957 or later. You may also need a second dose.  Meningococcal immunization. / 1 dose if you are age 19 to 21 and a first-year college student living in a residence hall, or have one of several medical conditions, you need to get vaccinated against meningococcal disease. You may also need additional booster doses.  Varicella immunization.** / Consult your caregiver.  Hepatitis A immunization.** / Consult your caregiver. 2 doses, 6 to 18 months apart.  Hepatitis B immunization.** / Consult your caregiver. 3 doses usually over 6 months. Ages 40 to 64  Blood pressure check.** / Every 1 to 2 years.  Lipid and cholesterol check.** / Every 5 years beginning at age  20.  Clinical breast exam.** / Every year after age 40.  Mammogram.** / Every year beginning at age 40 and continuing for as long as you are in good health. Consult with your caregiver.  Pap test.** / Every 3 years starting at age 30 through age 65 or 70 with a history of 3 consecutive normal Pap tests.  HPV screening.** / Every 3 years from ages 30 through ages 65 to 70 with a history of 3 consecutive normal Pap tests.  Fecal occult blood test (FOBT) of stool. / Every year beginning at age 50 and continuing until age 75. You may not need to do this test if you get a colonoscopy every 10 years.  Flexible sigmoidoscopy or colonoscopy.** / Every 5 years for a flexible sigmoidoscopy or every 10 years for a colonoscopy beginning at age 50 and continuing until age 75.  Hepatitis C blood test.** / For all people born from 1945 through 1965 and any individual with known risks for hepatitis C.  Skin self-exam. / Monthly.  Influenza immunization.** / Every year.  Pneumococcal polysaccharide immunization.** / 1 to 2 doses if you smoke cigarettes or if you have certain chronic medical conditions.  Tetanus, diphtheria, pertussis (Tdap, Td) immunization.** / A one-time dose of Tdap vaccine. After that, you need a Td booster dose every 10 years.  Measles, mumps, rubella (MMR) immunization. / You need at least 1 dose of MMR if you were born in 1957 or later. You may also need a second dose.  Varicella immunization.** / Consult your caregiver.  Meningococcal immunization.** / Consult your caregiver.  Hepatitis A immunization.** / Consult your caregiver. 2 doses, 6 to 18 months apart.  Hepatitis B immunization.** / Consult your caregiver. 3 doses, usually over 6 months. Ages 65 and over  Blood pressure check.** / Every 1 to 2 years.  Lipid and cholesterol check.** / Every 5 years beginning at age 20.  Clinical breast exam.** / Every year after age 40.  Mammogram.** / Every year beginning at  age 40 and continuing for as long as you are in good health. Consult with your caregiver.  Pap test.** / Every 3 years starting at age 30 through age 65 or 70 with a 3 consecutive normal Pap tests. Testing can be stopped between 65 and 70 with 3 consecutive normal Pap tests and no abnormal Pap or HPV tests in the past 10 years.  HPV screening.** / Every 3 years from ages 30 through ages 65 or 70 with a history of 3 consecutive normal Pap tests. Testing can be stopped between 65 and 70 with 3 consecutive normal Pap tests and no abnormal Pap or HPV   tests in the past 10 years.  Fecal occult blood test (FOBT) of stool. / Every year beginning at age 50 and continuing until age 75. You may not need to do this test if you get a colonoscopy every 10 years.  Flexible sigmoidoscopy or colonoscopy.** / Every 5 years for a flexible sigmoidoscopy or every 10 years for a colonoscopy beginning at age 50 and continuing until age 75.  Hepatitis C blood test.** / For all people born from 1945 through 1965 and any individual with known risks for hepatitis C.  Osteoporosis screening.** / A one-time screening for women ages 65 and over and women at risk for fractures or osteoporosis.  Skin self-exam. / Monthly.  Influenza immunization.** / Every year.  Pneumococcal polysaccharide immunization.** / 1 dose at age 65 (or older) if you have never been vaccinated.  Tetanus, diphtheria, pertussis (Tdap, Td) immunization. / A one-time dose of Tdap vaccine if you are over 65 and have contact with an infant, are a healthcare worker, or simply want to be protected from whooping cough. After that, you need a Td booster dose every 10 years.  Varicella immunization.** / Consult your caregiver.  Meningococcal immunization.** / Consult your caregiver.  Hepatitis A immunization.** / Consult your caregiver. 2 doses, 6 to 18 months apart.  Hepatitis B immunization.** / Check with your caregiver. 3 doses, usually over 6  months. ** Family history and personal history of risk and conditions may change your caregiver's recommendations. Document Released: 05/14/2001 Document Revised: 06/10/2011 Document Reviewed: 08/13/2010 Exit[employer] Patient Information 2013 [employer].  

## 2012-06-11 NOTE — Assessment & Plan Note (Signed)
Change med to methyldopa

## 2012-06-11 NOTE — Assessment & Plan Note (Signed)
Per endo--- on insulin secondary to pregnancy

## 2012-06-16 ENCOUNTER — Telehealth: Payer: Self-pay | Admitting: Pulmonary Disease

## 2012-06-16 NOTE — Telephone Encounter (Signed)
I spoke with Victorino Dike. She stated she received Dr. Teddy Spike note on pt's download. The reason he received a download on pt for one night is bc she only wore the CPAP x 1 night. Pt had the CPAP from 04/21/12-05/18/12. They tried with pt to get her to be compliant but still only wore it x 1 night. Per Victorino Dike she had wrote this on cover sheet on the auto download she had faxed over on pt. Please advise KC thanks

## 2012-06-16 NOTE — Telephone Encounter (Signed)
lmomtcb x1 for pt 

## 2012-06-16 NOTE — Telephone Encounter (Signed)
She needs ov to discuss treatment of her sleep apnea, and whether cpap is going to work for her.

## 2012-06-18 NOTE — Telephone Encounter (Signed)
lmtcb x2 for pt. 

## 2012-06-19 NOTE — Telephone Encounter (Signed)
Pt returned triage's call & can be reached at [phone].  Kathleen Jordan

## 2012-06-19 NOTE — Telephone Encounter (Signed)
lmomtcb for pt on the # provided below

## 2012-06-19 NOTE — Telephone Encounter (Signed)
LMTCBx3 on pt home/cell number and work number. Carron Curie, CMA

## 2012-06-22 NOTE — Telephone Encounter (Signed)
ATC patient at [phone].  No answer, LMOMTCB

## 2012-06-23 NOTE — Telephone Encounter (Signed)
lmtcb x2 

## 2012-06-24 NOTE — Telephone Encounter (Signed)
Called, spoke with pt. Pt states she already has an appt with [facility] on April 7. This is scheduled for 10:45 am. Pt would like to keep this appt. She will call back if anything further is needed prior. Nothing further needed at this time.

## 2012-07-06 ENCOUNTER — Ambulatory Visit: Payer: BC Managed Care – PPO | Admitting: Pulmonary Disease

## 2012-07-09 ENCOUNTER — Telehealth: Payer: Self-pay | Admitting: Family Medicine

## 2012-07-09 NOTE — Telephone Encounter (Signed)
Left message to call office

## 2012-07-09 NOTE — Telephone Encounter (Signed)
Patient returned your call. Your line was busy.

## 2012-07-09 NOTE — Telephone Encounter (Signed)
That is normally not enough medication-- she needs ov

## 2012-07-09 NOTE — Telephone Encounter (Signed)
msg left to call the office     KP 

## 2012-07-09 NOTE — Telephone Encounter (Signed)
Pt states that methyldopa (ALDOMET) 250 MG is causing her to be very drowsy so she reduce it down to 1 tab qhs. Pt was advise by her OB to inform PCP of this.  Pt request copy of recent labs to be faxed to OB. Labs faxed.

## 2012-07-09 NOTE — Telephone Encounter (Signed)
Patient called stating she has questions about her lab results and the bp med she is on. She would like a call back at [phone].

## 2012-07-10 ENCOUNTER — Ambulatory Visit (INDEPENDENT_AMBULATORY_CARE_PROVIDER_SITE_OTHER): Payer: BC Managed Care – PPO | Admitting: Pulmonary Disease

## 2012-07-10 ENCOUNTER — Encounter: Payer: Self-pay | Admitting: Pulmonary Disease

## 2012-07-10 VITALS — BP 106/62 | HR 86 | Temp 99.6°F

## 2012-07-10 DIAGNOSIS — G4733 Obstructive sleep apnea (adult) (pediatric): Secondary | ICD-10-CM

## 2012-07-10 NOTE — Progress Notes (Signed)
  Subjective:    Patient ID: Kathleen Jordan, female    DOB: [birth year], 42 y.o.   MRN: [id number]  HPI Patient comes in today for followup of her obstructive sleep apnea.  She been trying to wear her CPAP device component, but has never found a mask that fits appropriately.  She continues to have significant mask leaks.  We placed her on an automatic device at last visit, but her medical equipment Company never sent [place] the download.  The automatic device was taken back, and she is currently on her old machine that is over 33 years old and on an unknown fixed pressure.   Review of Systems  Constitutional: Negative for fever and unexpected weight change.  HENT: Positive for congestion, rhinorrhea, postnasal drip and sinus pressure. Negative for ear pain, nosebleeds, sore throat, sneezing, trouble swallowing and dental problem.   Eyes: Negative for redness and itching.  Respiratory: Positive for cough. Negative for chest tightness, shortness of breath and wheezing.   Cardiovascular: Negative for palpitations and leg swelling.  Gastrointestinal: Negative for nausea and vomiting.  Genitourinary: Negative for dysuria.  Musculoskeletal: Negative for joint swelling.  Skin: Negative for rash.  Allergic/Immunologic: Positive for immunocompromised state.  Neurological: Negative for headaches.  Hematological: Does not bruise/bleed easily.  Psychiatric/Behavioral: Negative for dysphoric mood. The patient is not nervous/anxious.        Objective:   Physical Exam Obese female in no acute distress Nose without purulent discharge noted No skin breakdown or pressure necrosis from the CPAP mask Neck without lymphadenopathy or thyromegaly Lower extremities with mild edema, cyanosis Alert, does not appear to be sleepy, moves all 4 extremities.       Assessment & Plan:

## 2012-07-10 NOTE — Telephone Encounter (Signed)
My-chart message to schedule an Office visit.   KP

## 2012-07-10 NOTE — Assessment & Plan Note (Signed)
The patient currently is on CPAP with a fixed pressure that has yet to be optimized.  She is also having ongoing issues with mask leaks, and this will have to be resolved.  I would like to send her to the sleep center for a formal mask fitting, and we'll also get her a new device since hers is aged and getting her issues.  We'll probably need to optimize her pressure again on the automatic setting with the new mask.

## 2012-07-10 NOTE — Patient Instructions (Addendum)
Will send you to the sleep center during the day for a mask fitting session Will see if we can get you a new machine, and get pressure optimized.   Work on weight loss.  followup with me in 6mos, but call if you are having difficulties with your device.

## 2012-07-14 ENCOUNTER — Telehealth: Payer: Self-pay | Admitting: Pulmonary Disease

## 2012-07-14 NOTE — Telephone Encounter (Signed)
I would have pcc call insurance company directly to verify this because it is a ridiculous request.  She already has a cpap machine and is using it.  She has NOT had a break in therapy.  No other insurance company makes [place] do this as long as there is not a break in therapy.

## 2012-07-14 NOTE — Telephone Encounter (Signed)
PCC's please advise. thanks 

## 2012-07-14 NOTE — Telephone Encounter (Signed)
Pt was seen by [facility] on 07/10/12.  Order sent for following: Note Pt needs a new cpap machine if eligible. Hers is 7 yrs + old She apparently has met her individual deductible. Needs s9 escape/auto with h/h.  -----  Spoke with Millie with [employer].  Was advised she had to do a PA for replacement machine.  Per [employer], insurance co states pt needs to have a new sleep study done and start process over because her last sleep study is too old.  KC, pls advise if you are ok with ordering a new sleep study on pt.  There is a sleep study scanned into chart from 2007.    ** Millie provided information above on who she spoke with at insurance co and contact #.

## 2012-07-15 ENCOUNTER — Other Ambulatory Visit (HOSPITAL_BASED_OUTPATIENT_CLINIC_OR_DEPARTMENT_OTHER): Payer: BC Managed Care – PPO

## 2012-07-16 NOTE — Telephone Encounter (Signed)
Spoke to [facility] they said dme has to file a claim for this new cpap replacement before it can be denied call dme and they are aware of this Kathleen Jordan

## 2012-07-21 ENCOUNTER — Telehealth: Payer: Self-pay | Admitting: Family Medicine

## 2012-07-21 MED ORDER — CETIRIZINE HCL 10 MG PO TABS: 10.0000 mg | ORAL_TABLET | Freq: Every day | ORAL | Status: DC

## 2012-07-21 NOTE — Telephone Encounter (Signed)
Refill- cetirizine 10mg  tablets. Take one tablet by mouth daily. Qty 30 last fill 3.16.14

## 2012-07-28 ENCOUNTER — Telehealth: Payer: Self-pay | Admitting: Pulmonary Disease

## 2012-07-28 ENCOUNTER — Ambulatory Visit: Payer: BC Managed Care – PPO

## 2012-07-28 NOTE — Telephone Encounter (Signed)
Yes we never got a good reading to set her on a set pressure  Tobe Sos

## 2012-07-28 NOTE — Telephone Encounter (Signed)
Ok but the order from 07/10/12 has no pressure setting for new cpap so what do you want the new machine set on DEAR!

## 2012-07-28 NOTE — Telephone Encounter (Signed)
Libby to clarify. [employer] needs order for repeat auto since pt only wore CPAP last time x 1 night only. thanks

## 2012-07-28 NOTE — Telephone Encounter (Signed)
Kathleen Jordan, that is the problem.  I have never received a download to set a pressure!!!  The last one was not successful and I asked for a repeat.

## 2012-07-28 NOTE — Telephone Encounter (Signed)
See order on 07/10/12.  Since the titration was not successful, that doesn't mean they need ANOTHER order to do the same thing.  Get it done.

## 2012-07-29 NOTE — Telephone Encounter (Signed)
Order refaxed to [facility] to get new cpap and set on auto x2wks with a download Tobe Sos

## 2012-07-30 ENCOUNTER — Telehealth: Payer: Self-pay | Admitting: Family Medicine

## 2012-07-30 NOTE — Telephone Encounter (Signed)
Paperwork picked up.     KP

## 2012-07-30 NOTE — Telephone Encounter (Signed)
That is fine 

## 2012-07-30 NOTE — Telephone Encounter (Signed)
Patient called to inform Dr. Laury Axon that they do not need TB tests and that she can just mark out that section of the paperwork.

## 2012-07-30 NOTE — Telephone Encounter (Signed)
Dr.Lowne it the appropriate. Please advise      KP

## 2012-08-13 ENCOUNTER — Other Ambulatory Visit: Payer: Self-pay | Admitting: Family Medicine

## 2012-08-13 NOTE — Telephone Encounter (Signed)
Patient is pregnant, please advise if this is okay to fill.      KP

## 2012-10-07 ENCOUNTER — Other Ambulatory Visit: Payer: Self-pay | Admitting: Family Medicine

## 2012-10-08 NOTE — Telephone Encounter (Signed)
Last filled 08/13/12 #90. Please advise      KP

## 2012-10-11 ENCOUNTER — Other Ambulatory Visit: Payer: Self-pay | Admitting: Pulmonary Disease

## 2012-10-11 DIAGNOSIS — G4733 Obstructive sleep apnea (adult) (pediatric): Secondary | ICD-10-CM

## 2012-10-13 ENCOUNTER — Ambulatory Visit: Payer: BC Managed Care – PPO | Admitting: Pulmonary Disease

## 2012-10-14 ENCOUNTER — Ambulatory Visit: Payer: BC Managed Care – PPO | Admitting: Pulmonary Disease

## 2012-10-27 ENCOUNTER — Encounter (HOSPITAL_COMMUNITY): Payer: Self-pay | Admitting: *Deleted

## 2012-10-27 ENCOUNTER — Inpatient Hospital Stay (HOSPITAL_COMMUNITY)
Admission: AD | Admit: 2012-10-27 | Discharge: 2012-10-27 | Disposition: A | Discharge status: Home / Self Care | Payer: BC Managed Care – PPO | Source: Ambulatory Visit | Attending: Obstetrics and Gynecology | Admitting: Obstetrics and Gynecology

## 2012-10-27 ENCOUNTER — Inpatient Hospital Stay (HOSPITAL_COMMUNITY): Payer: BC Managed Care – PPO

## 2012-10-27 DIAGNOSIS — E119 Type 2 diabetes mellitus without complications: Secondary | ICD-10-CM | POA: Insufficient documentation

## 2012-10-27 DIAGNOSIS — O24919 Unspecified diabetes mellitus in pregnancy, unspecified trimester: Secondary | ICD-10-CM | POA: Insufficient documentation

## 2012-10-27 DIAGNOSIS — O10019 Pre-existing essential hypertension complicating pregnancy, unspecified trimester: Secondary | ICD-10-CM | POA: Insufficient documentation

## 2012-10-27 DIAGNOSIS — Z794 Long term (current) use of insulin: Secondary | ICD-10-CM | POA: Insufficient documentation

## 2012-10-27 NOTE — MAU Note (Signed)
Patient sent from the office for monitoring after BPP 4/10 in the office.

## 2012-10-27 NOTE — MAU Note (Signed)
History     Chief Complaint  Patient presents with  . Non-stress Test   42 yo G3P0020 MBF now @ 36 3/[redacted] weeks gestation sent from office for further evaluation due to BPP 4/10. PNC complicated by chronic HTN on aldomet and Class B DM on insulin. (+) FM  OB History   Grav Para Term Preterm Abortions TAB SAB Ect Mult Living   3 0 0 0 2 0 2 0 0 0       Past Medical History  Diagnosis Date  . Hypertension   . Hyperlipidemia   . PCOS (polycystic ovarian syndrome)   . Diabetes mellitus   . Allergic rhinitis     Past Surgical History  Procedure Laterality Date  . Breast reduction surgery  07-2003  . Invitro fertilization  11/16/2010    [facility]    Family History  Problem Relation Age of Onset  . Arthritis Mother   . Diabetes Mother   . Hypertension Mother   . Heart disease Mother 65    MI  . Polycystic ovary syndrome Sister   . Diabetes Brother   . Hypertension Brother   . Arthritis Maternal Grandmother   . Diabetes Maternal Grandmother   . Hypertension Maternal Grandmother   . Stroke Maternal Grandfather   . Asthma Sister   . Allergies Sister     History  Substance Use Topics  . Smoking status: Never Smoker   . Smokeless tobacco: Not on file  . Alcohol Use: No    Allergies: No Known Allergies  Prescriptions prior to admission  Medication Sig Dispense Refill  . cetirizine (ZYRTEC) 10 MG tablet Take 1 tablet (10 mg total) by mouth daily.  30 tablet  11  . Cholecalciferol (VITAMIN D3) 2000 UNITS TABS Take by mouth daily.      Marland Kitchen docusate sodium (COLACE) 50 MG capsule Take by mouth daily as needed for constipation.      . insulin aspart (NOVOLOG) 100 UNIT/ML injection Inject 6-8 Units into the skin 3 (three) times daily before meals.      . insulin detemir (LEVEMIR) 100 UNIT/ML injection Inject 35 Units into the skin at bedtime.       . methyldopa (ALDOMET) 250 MG tablet TAKE 1 TABLET BY MOUTH THREE TIMES DAILY  90 tablet  0  . polyethylene glycol (MIRALAX /  GLYCOLAX) packet Take 17 g by mouth daily.      . Prenatal Vit-Fe Fumarate-FA (PRENAPLUS) 27-1 MG TABS TAKE 1 TABLET DAILY  90 tablet  2  . senna (SENOKOT) 8.6 MG tablet Take 1 tablet by mouth daily.      . Skin Protectants, Misc. (EUCERIN) cream Apply topically as needed for wound care.  397 g  0     Physical Exam   Blood pressure 163/88, pulse 84, temperature 98.2 F (36.8 C), resp. rate 22, height 5\' 8"  (1.727 m), weight 116.121 kg (256 lb), last menstrual period 02/15/2012.  No exam performed today, just done in office. Tracing; baseline 130 (+) accels to 150  ED Course  IMP: BPP 4/10 w/ now reassuring/reactive NST IUP @ 36 3/7 weeks Chronic HTN on med.  Class B DM on insulin P) repeat BPP. If it is reassuring, d/c home and f/u in office  MDM   Kyndle Schlender A, MD 2:07 PM 10/27/2012

## 2012-10-27 NOTE — MAU Note (Signed)
Pt back from u/s. BPP 8/8. Dr Cherly Hensen notified via phone. Pt stable for d/c home. Dr Cherly Hensen spoke to pt by phone to discuss u/s results, d/c plan, and to f/u in office on Friday as scheduled. Pt agrees with plan.

## 2012-10-27 NOTE — Progress Notes (Signed)
Written and verbal d/c instructions given and understanding voiced. 

## 2012-11-03 ENCOUNTER — Encounter (HOSPITAL_COMMUNITY): Payer: Self-pay | Admitting: *Deleted

## 2012-11-03 ENCOUNTER — Other Ambulatory Visit: Payer: Self-pay | Admitting: Obstetrics and Gynecology

## 2012-11-03 ENCOUNTER — Telehealth (HOSPITAL_COMMUNITY): Payer: Self-pay | Admitting: *Deleted

## 2012-11-03 NOTE — Telephone Encounter (Signed)
Preadmission screen  

## 2012-11-06 ENCOUNTER — Encounter (HOSPITAL_COMMUNITY): Payer: Self-pay | Admitting: *Deleted

## 2012-11-06 ENCOUNTER — Inpatient Hospital Stay (HOSPITAL_COMMUNITY)
Admission: AD | Admit: 2012-11-06 | Discharge: 2012-11-06 | Disposition: A | Discharge status: Home / Self Care | Payer: BC Managed Care – PPO | Source: Ambulatory Visit | Attending: Obstetrics and Gynecology | Admitting: Obstetrics and Gynecology

## 2012-11-06 ENCOUNTER — Telehealth (HOSPITAL_COMMUNITY): Payer: Self-pay | Admitting: *Deleted

## 2012-11-06 ENCOUNTER — Encounter (HOSPITAL_COMMUNITY): Payer: Self-pay

## 2012-11-06 ENCOUNTER — Other Ambulatory Visit: Payer: Self-pay | Admitting: Obstetrics and Gynecology

## 2012-11-06 DIAGNOSIS — R030 Elevated blood-pressure reading, without diagnosis of hypertension: Secondary | ICD-10-CM | POA: Insufficient documentation

## 2012-11-06 DIAGNOSIS — O99891 Other specified diseases and conditions complicating pregnancy: Secondary | ICD-10-CM | POA: Insufficient documentation

## 2012-11-06 LAB — CBC
Hemoglobin: 12.6 g/dL (ref 12.0–15.0)
MCHC: 34.4 g/dL (ref 30.0–36.0)
RDW: 13.9 % (ref 11.5–15.5)
WBC: 6.3 10*3/uL (ref 4.0–10.5)

## 2012-11-06 LAB — COMPREHENSIVE METABOLIC PANEL
ALT: 18 U/L (ref 0–35)
Albumin: 2.7 g/dL — ABNORMAL LOW (ref 3.5–5.2)
Alkaline Phosphatase: 225 U/L — ABNORMAL HIGH (ref 39–117)
Chloride: 102 mEq/L (ref 96–112)
Glucose, Bld: 183 mg/dL — ABNORMAL HIGH (ref 70–99)
Potassium: 3.2 mEq/L — ABNORMAL LOW (ref 3.5–5.1)
Sodium: 134 mEq/L — ABNORMAL LOW (ref 135–145)
Total Protein: 6.5 g/dL (ref 6.0–8.3)

## 2012-11-06 LAB — URIC ACID: Uric Acid, Serum: 4.3 mg/dL (ref 2.4–7.0)

## 2012-11-06 NOTE — Telephone Encounter (Signed)
Preadmission screen  

## 2012-11-06 NOTE — MAU Note (Signed)
Pt states here for bp evaluation, BPP 6/8, has had bloodwork drawn and Dr. Cherly Hensen has reviewed. Pt needs NST. Denies bleeding, notes some cramping.

## 2012-11-12 ENCOUNTER — Encounter (HOSPITAL_COMMUNITY): Payer: Self-pay

## 2012-11-12 ENCOUNTER — Inpatient Hospital Stay (HOSPITAL_COMMUNITY)
Admission: AD | Admit: 2012-11-12 | Discharge: 2012-11-15 | DRG: 372 | Disposition: A | Discharge status: Home / Self Care | Payer: BC Managed Care – PPO | Source: Ambulatory Visit | Attending: Obstetrics and Gynecology | Admitting: Obstetrics and Gynecology

## 2012-11-12 DIAGNOSIS — O1002 Pre-existing essential hypertension complicating childbirth: Principal | ICD-10-CM | POA: Diagnosis present

## 2012-11-12 DIAGNOSIS — O2432 Unspecified pre-existing diabetes mellitus in childbirth: Secondary | ICD-10-CM | POA: Diagnosis present

## 2012-11-12 DIAGNOSIS — Z794 Long term (current) use of insulin: Secondary | ICD-10-CM

## 2012-11-12 DIAGNOSIS — O169 Unspecified maternal hypertension, unspecified trimester: Secondary | ICD-10-CM | POA: Diagnosis present

## 2012-11-12 DIAGNOSIS — E119 Type 2 diabetes mellitus without complications: Secondary | ICD-10-CM | POA: Diagnosis present

## 2012-11-12 DIAGNOSIS — O99892 Other specified diseases and conditions complicating childbirth: Secondary | ICD-10-CM | POA: Diagnosis present

## 2012-11-12 DIAGNOSIS — O3660X0 Maternal care for excessive fetal growth, unspecified trimester, not applicable or unspecified: Secondary | ICD-10-CM | POA: Diagnosis present

## 2012-11-12 DIAGNOSIS — O09529 Supervision of elderly multigravida, unspecified trimester: Secondary | ICD-10-CM | POA: Diagnosis present

## 2012-11-12 DIAGNOSIS — Z22330 Carrier of Group B streptococcus: Secondary | ICD-10-CM

## 2012-11-12 LAB — GLUCOSE, CAPILLARY
Glucose-Capillary: 116 mg/dL — ABNORMAL HIGH (ref 70–99)
Glucose-Capillary: 78 mg/dL (ref 70–99)
Glucose-Capillary: 144 mg/dL — ABNORMAL HIGH (ref 70–99)
Glucose-Capillary: 124 mg/dL — ABNORMAL HIGH (ref 70–99)
Glucose-Capillary: 155 mg/dL — ABNORMAL HIGH (ref 70–99)

## 2012-11-12 LAB — COMPREHENSIVE METABOLIC PANEL
Albumin: 2.7 g/dL — ABNORMAL LOW (ref 3.5–5.2)
Alkaline Phosphatase: 224 U/L — ABNORMAL HIGH (ref 39–117)
BUN: 5 mg/dL — ABNORMAL LOW (ref 6–23)
CO2: 21 mEq/L (ref 19–32)
Chloride: 104 mEq/L (ref 96–112)
Creatinine, Ser: 0.57 mg/dL (ref 0.50–1.10)
GFR calc non Af Amer: >90 mL/min (ref >90)
Potassium: 2.9 mEq/L — ABNORMAL LOW (ref 3.5–5.1)
Total Bilirubin: 0.4 mg/dL (ref 0.3–1.2)

## 2012-11-12 LAB — ABO/RH: ABO/RH(D): O POS

## 2012-11-12 LAB — CBC
Hemoglobin: 12.3 g/dL (ref 12.0–15.0)
MCHC: 34.8 g/dL (ref 30.0–36.0)
RDW: 13.6 % (ref 11.5–15.5)

## 2012-11-12 LAB — TYPE AND SCREEN: ABO/RH(D): O POS

## 2012-11-12 LAB — URIC ACID: Uric Acid, Serum: 4.8 mg/dL (ref 2.4–7.0)

## 2012-11-12 LAB — RPR: RPR Ser Ql: NONREACTIVE

## 2012-11-12 MED ORDER — DIPHENHYDRAMINE HCL 50 MG/ML IJ SOLN: 12.5000 mg | INTRAMUSCULAR | Status: DC | PRN

## 2012-11-12 MED ORDER — IBUPROFEN 600 MG PO TABS: 600.0000 mg | ORAL_TABLET | Freq: Four times a day (QID) | ORAL | Status: DC | PRN

## 2012-11-12 MED ORDER — LACTATED RINGERS IV SOLN
500.0000 mL | Freq: Once | INTRAVENOUS | Status: AC
  Administered 2012-11-13: 500 mL via INTRAVENOUS

## 2012-11-12 MED ORDER — POTASSIUM CHLORIDE 2 MEQ/ML IV SOLN
INTRAVENOUS | Status: DC
  Administered 2012-11-12 – 2012-11-13 (×2): via INTRAVENOUS
  Filled 2012-11-12 (×3): qty 1000

## 2012-11-12 MED ORDER — LACTATED RINGERS IV SOLN
INTRAVENOUS | Status: DC
  Administered 2012-11-12 – 2012-11-13 (×2): via INTRAVENOUS

## 2012-11-12 MED ORDER — EPHEDRINE 5 MG/ML INJ
10.0000 mg | INTRAVENOUS | Status: DC | PRN
  Filled 2012-11-12: qty 4
  Filled 2012-11-12: qty 2

## 2012-11-12 MED ORDER — OXYCODONE-ACETAMINOPHEN 5-325 MG PO TABS: 1.0000 | ORAL_TABLET | ORAL | Status: DC | PRN

## 2012-11-12 MED ORDER — OXYTOCIN BOLUS FROM INFUSION: 500.0000 mL | INTRAVENOUS | Status: DC

## 2012-11-12 MED ORDER — NALBUPHINE HCL 10 MG/ML IJ SOLN
10.0000 mg | INTRAMUSCULAR | Status: DC | PRN
  Filled 2012-11-12: qty 1

## 2012-11-12 MED ORDER — PHENYLEPHRINE 40 MCG/ML (10ML) SYRINGE FOR IV PUSH (FOR BLOOD PRESSURE SUPPORT)
80.0000 ug | PREFILLED_SYRINGE | INTRAVENOUS | Status: DC | PRN
  Filled 2012-11-12: qty 2

## 2012-11-12 MED ORDER — NALBUPHINE SYRINGE 5 MG/0.5 ML
10.0000 mg | INJECTION | INTRAMUSCULAR | Status: DC | PRN
  Administered 2012-11-12 – 2012-11-13 (×2): 10 mg via INTRAVENOUS
  Filled 2012-11-12 (×3): qty 1

## 2012-11-12 MED ORDER — METHYLDOPA 500 MG PO TABS
500.0000 mg | ORAL_TABLET | Freq: Three times a day (TID) | ORAL | Status: DC
  Administered 2012-11-12 – 2012-11-15 (×8): 500 mg via ORAL
  Filled 2012-11-12 (×9): qty 1

## 2012-11-12 MED ORDER — METHYLDOPATE HCL 250 MG/5ML IV SOLN: 500.0000 mg | Freq: Three times a day (TID) | INTRAVENOUS | Status: DC

## 2012-11-12 MED ORDER — LIDOCAINE HCL (PF) 1 % IJ SOLN
30.0000 mL | INTRAMUSCULAR | Status: DC | PRN
  Filled 2012-11-12: qty 30

## 2012-11-12 MED ORDER — CITRIC ACID-SODIUM CITRATE 334-500 MG/5ML PO SOLN: 30.0000 mL | ORAL | Status: DC | PRN

## 2012-11-12 MED ORDER — FENTANYL 2.5 MCG/ML BUPIVACAINE 1/10 % EPIDURAL INFUSION (WH - ANES)
14.0000 mL/h | INTRAMUSCULAR | Status: DC | PRN
  Filled 2012-11-12: qty 125

## 2012-11-12 MED ORDER — LACTATED RINGERS IV SOLN
500.0000 mL | INTRAVENOUS | Status: DC | PRN
  Administered 2012-11-13: 500 mL via INTRAVENOUS

## 2012-11-12 MED ORDER — EPHEDRINE 5 MG/ML INJ
10.0000 mg | INTRAVENOUS | Status: DC | PRN
  Filled 2012-11-12: qty 2

## 2012-11-12 MED ORDER — ONDANSETRON HCL 4 MG/2ML IJ SOLN: 4.0000 mg | Freq: Four times a day (QID) | INTRAMUSCULAR | Status: DC | PRN

## 2012-11-12 MED ORDER — TERBUTALINE SULFATE 1 MG/ML IJ SOLN: 0.2500 mg | Freq: Once | INTRAMUSCULAR | Status: DC | PRN

## 2012-11-12 MED ORDER — SODIUM CHLORIDE 0.9 % IV SOLN
INTRAVENOUS | Status: DC
  Administered 2012-11-12: 0.3 [IU]/h via INTRAVENOUS
  Administered 2012-11-12: 1 [IU]/h via INTRAVENOUS
  Administered 2012-11-13: 1.4 [IU]/h via INTRAVENOUS
  Administered 2012-11-13: 1.8 [IU]/h via INTRAVENOUS
  Filled 2012-11-12: qty 1

## 2012-11-12 MED ORDER — PHENYLEPHRINE 40 MCG/ML (10ML) SYRINGE FOR IV PUSH (FOR BLOOD PRESSURE SUPPORT)
80.0000 ug | PREFILLED_SYRINGE | INTRAVENOUS | Status: DC | PRN
  Filled 2012-11-12: qty 5
  Filled 2012-11-12: qty 2

## 2012-11-12 MED ORDER — ACETAMINOPHEN 325 MG PO TABS: 650.0000 mg | ORAL_TABLET | ORAL | Status: DC | PRN

## 2012-11-12 MED ORDER — OXYTOCIN 40 UNITS IN LACTATED RINGERS INFUSION - SIMPLE MED
1.0000 m[IU]/min | INTRAVENOUS | Status: DC
  Administered 2012-11-12: 2 m[IU]/min via INTRAVENOUS

## 2012-11-12 MED ORDER — TERBUTALINE SULFATE 1 MG/ML IJ SOLN: 0.2500 mg | Freq: Once | INTRAMUSCULAR | Status: AC | PRN

## 2012-11-12 MED ORDER — OXYTOCIN 40 UNITS IN LACTATED RINGERS INFUSION - SIMPLE MED
62.5000 mL/h | INTRAVENOUS | Status: DC
  Administered 2012-11-13: 62.5 mL/h via INTRAVENOUS
  Filled 2012-11-12: qty 1000

## 2012-11-12 NOTE — H&P (Signed)
Kathleen Jordan is a 42 y.o. female presenting for induction @ 38 5/[redacted] weeks gestation due to chronic HTn not well controlled on aldomet and Class B DM on insulin. Pt notes leg swelling. Denies h/a, visual changes. (+) 3lb weight gain in 2 days. History OB History   Grav Para Term Preterm Abortions TAB SAB Ect Mult Living   3 0 0 0 2 0 2 0 0 0      Past Medical History  Diagnosis Date  . Hypertension   . Hyperlipidemia   . PCOS (polycystic ovarian syndrome)   . Allergic rhinitis   . Obesity   . Hx of varicella   . Hypopotassemia   . Diabetes mellitus     insulin  . Sleep apnea   . Difficult intubation     pt states with breast reduction surgery MD's had difficulty   Past Surgical History  Procedure Laterality Date  . Breast reduction surgery  07-2003  . Invitro fertilization  11/16/2010    [facility]   Family History: family history includes Allergies in her sister; Arthritis in her maternal grandmother and mother; Asthma in her sister; Cancer in her maternal grandfather and maternal grandmother; Diabetes in her brother, maternal grandmother, and mother; Heart disease (age of onset: 65) in her mother; Hypertension in her brother, maternal grandmother, and mother; Stroke in her maternal grandfather. Social History:  reports that she has never smoked. She has never used smokeless tobacco. She reports that she does not drink alcohol or use illicit drugs.   Prenatal Transfer Tool  Maternal Diabetes: Yes:  Diabetes Type:  Pre-pregnancy Genetic Screening: Normal Maternal Ultrasounds/Referrals: Normal Fetal Ultrasounds or other Referrals:  Fetal echo nl Maternal Substance Abuse:  No Significant Maternal Medications:  Meds include: Other:  Significant Maternal Lab Results:  Lab values include: Group B Strep positive Other Comments:  on aldomet, insulin  ROS leg swelling  Dilation: 1.5 Effacement (%): 90 Station: -1 Exam by:: Tona Sensing, RN Blood pressure 165/88, pulse 85,  temperature 98.3 F (36.8 C), temperature source Oral, resp. rate 20, height 5\' 8"  (1.727 m), weight 116.574 kg (257 lb), last menstrual period 02/15/2012. Exam Physical Exam  Constitutional: She is oriented to person, place, and time. She appears well-developed and well-nourished.  HENT:  Head: Atraumatic.  Eyes: EOM are normal.  Neck: Neck supple.  Cardiovascular: Normal rate and regular rhythm.   Respiratory: Breath sounds normal.  GI: Soft.  Musculoskeletal: She exhibits edema.  Neurological: She is alert and oriented to person, place, and time.  Skin: Skin is warm and dry.  Psychiatric: She has a normal mood and affect.    Prenatal labs: ABO, Rh: --/--/O POS (08/14 1345) Antibody: PENDING (08/14 1345) Rubella: Immune (12/23 0000) RPR: Nonreactive (12/23 0000)  HBsAg: Negative (12/23 0000)  HIV: Non-reactive (12/23 0000)  GBS: Negative (07/21 0000)   Assessment/Plan: Chronic HTN on aldomet not well controlled r/o superimposed preeclampsia Class B DM AMA Hypokalemia Low plt ct ? Gestational  IUP @ 38 5/7 weeks w/ favorable cervix P) admit PIH labs. Diabetic glucose stabilizer. Pitocin induction. BS q 1hr cont aldomet. Analgesic prn. Replete potassium  Kordae Buonocore A 11/12/2012, 2:37 PM

## 2012-11-12 NOTE — Anesthesia Preprocedure Evaluation (Signed)
Anesthesia Evaluation  Patient identified by MRN, date of birth, ID band Patient awake    Reviewed: Allergy & Precautions, H&P , NPO status , Patient's Chart, lab work & pertinent test results, reviewed documented beta blocker date and time   Airway Mallampati: I TM Distance: >3 FB Neck ROM: full    Dental  (+) Teeth Intact   Pulmonary sleep apnea and Continuous Positive Airway Pressure Ventilation ,  Seasonal allergies breath sounds clear to auscultation        Cardiovascular hypertension (CHTN, on aldomet), On Medications Rhythm:regular Rate:Normal  hyperlipidemia   Neuro/Psych negative neurological ROS  negative psych ROS   GI/Hepatic Neg liver ROS, GERD-  Medicated,  Endo/Other  diabetes, Type 2, Insulin DependentMorbid obesityPCOS  Renal/GU negative Renal ROS  negative genitourinary   Musculoskeletal   Abdominal   Peds  Hematology  (+) Blood dyscrasia (Thrombocytopenia - plt 121), ,   Anesthesia Other Findings   Reproductive/Obstetrics (+) Pregnancy                           Anesthesia Physical Anesthesia Plan  ASA: III  Anesthesia Plan: Epidural   Post-op Pain Management:    Induction:   Airway Management Planned:   Additional Equipment:   Intra-op Plan:   Post-operative Plan:   Informed Consent: I have reviewed the patients History and Physical, chart, labs and discussed the procedure including the risks, benefits and alternatives for the proposed anesthesia with the patient or authorized representative who has indicated his/her understanding and acceptance.     Plan Discussed with: Surgeon  Anesthesia Plan Comments:         Anesthesia Quick Evaluation

## 2012-11-12 NOTE — Progress Notes (Signed)
Cherly Hensen, MD, in department. Orders received for glucostablizer. RN to infuse D5LR at 100 ml/hr and LR is to run at a KVO rate with pitocin infusion.

## 2012-11-13 ENCOUNTER — Inpatient Hospital Stay (HOSPITAL_COMMUNITY): Payer: BC Managed Care – PPO | Admitting: Anesthesiology

## 2012-11-13 ENCOUNTER — Encounter (HOSPITAL_COMMUNITY): Payer: Self-pay | Admitting: Obstetrics and Gynecology

## 2012-11-13 ENCOUNTER — Encounter (HOSPITAL_COMMUNITY): Payer: Self-pay | Admitting: Anesthesiology

## 2012-11-13 DIAGNOSIS — O169 Unspecified maternal hypertension, unspecified trimester: Secondary | ICD-10-CM | POA: Diagnosis present

## 2012-11-13 LAB — CBC
HCT: 36.3 % (ref 36.0–46.0)
MCH: 30.1 pg (ref 26.0–34.0)
MCHC: 35 g/dL (ref 30.0–36.0)
MCV: 86 fL (ref 78.0–100.0)
MCV: 87.5 fL (ref 78.0–100.0)
Platelets: 129 10*3/uL — ABNORMAL LOW (ref 150–400)
RBC: 3.69 MIL/uL — ABNORMAL LOW (ref 3.87–5.11)
RDW: 13.8 % (ref 11.5–15.5)
WBC: 15.7 10*3/uL — ABNORMAL HIGH (ref 4.0–10.5)

## 2012-11-13 LAB — GLUCOSE, CAPILLARY
Glucose-Capillary: 118 mg/dL — ABNORMAL HIGH (ref 70–99)
Glucose-Capillary: 117 mg/dL — ABNORMAL HIGH (ref 70–99)
Glucose-Capillary: 119 mg/dL — ABNORMAL HIGH (ref 70–99)
Glucose-Capillary: 119 mg/dL — ABNORMAL HIGH (ref 70–99)
Glucose-Capillary: 150 mg/dL — ABNORMAL HIGH (ref 70–99)
Glucose-Capillary: 152 mg/dL — ABNORMAL HIGH (ref 70–99)

## 2012-11-13 MED ORDER — OXYTOCIN 40 UNITS IN LACTATED RINGERS INFUSION - SIMPLE MED: 1.0000 m[IU]/min | INTRAVENOUS | Status: DC

## 2012-11-13 MED ORDER — INSULIN LISPRO 100 UNIT/ML ~~LOC~~ SOLN: 8.0000 [IU] | Freq: Three times a day (TID) | SUBCUTANEOUS | Status: DC

## 2012-11-13 MED ORDER — SENNOSIDES-DOCUSATE SODIUM 8.6-50 MG PO TABS
2.0000 | ORAL_TABLET | Freq: Every day | ORAL | Status: DC
  Administered 2012-11-13 – 2012-11-14 (×2): 2 via ORAL

## 2012-11-13 MED ORDER — WITCH HAZEL-GLYCERIN EX PADS: 1.0000 "application " | MEDICATED_PAD | CUTANEOUS | Status: DC | PRN

## 2012-11-13 MED ORDER — LIDOCAINE HCL (PF) 1 % IJ SOLN
INTRAMUSCULAR | Status: DC | PRN
  Administered 2012-11-13 (×2): 4 mL

## 2012-11-13 MED ORDER — OXYCODONE-ACETAMINOPHEN 5-325 MG PO TABS
1.0000 | ORAL_TABLET | ORAL | Status: DC | PRN
  Administered 2012-11-13: 1 via ORAL
  Filled 2012-11-13 (×2): qty 1

## 2012-11-13 MED ORDER — FLUTICASONE PROPIONATE 50 MCG/ACT NA SUSP
2.0000 | Freq: Every day | NASAL | Status: DC | PRN
  Administered 2012-11-14: 2 via NASAL
  Filled 2012-11-13: qty 16

## 2012-11-13 MED ORDER — INSULIN ASPART PROT & ASPART (70-30 MIX) 100 UNIT/ML ~~LOC~~ SUSP: 8.0000 [IU] | Freq: Every day | SUBCUTANEOUS | Status: DC

## 2012-11-13 MED ORDER — PRENATAL MULTIVITAMIN CH
1.0000 | ORAL_TABLET | Freq: Every day | ORAL | Status: DC
  Administered 2012-11-13 – 2012-11-15 (×3): 1 via ORAL
  Filled 2012-11-13 (×4): qty 1

## 2012-11-13 MED ORDER — DIPHENHYDRAMINE HCL 25 MG PO CAPS: 25.0000 mg | ORAL_CAPSULE | Freq: Four times a day (QID) | ORAL | Status: DC | PRN

## 2012-11-13 MED ORDER — ONDANSETRON HCL 4 MG/2ML IJ SOLN: 4.0000 mg | INTRAMUSCULAR | Status: DC | PRN

## 2012-11-13 MED ORDER — ONDANSETRON HCL 4 MG PO TABS: 4.0000 mg | ORAL_TABLET | ORAL | Status: DC | PRN

## 2012-11-13 MED ORDER — INSULIN GLARGINE 100 UNIT/ML ~~LOC~~ SOLN: 50.0000 [IU] | Freq: Every day | SUBCUTANEOUS | Status: DC

## 2012-11-13 MED ORDER — IBUPROFEN 600 MG PO TABS
600.0000 mg | ORAL_TABLET | Freq: Four times a day (QID) | ORAL | Status: DC
  Administered 2012-11-13 – 2012-11-15 (×7): 600 mg via ORAL
  Filled 2012-11-13 (×6): qty 1

## 2012-11-13 MED ORDER — SIMETHICONE 80 MG PO CHEW: 80.0000 mg | CHEWABLE_TABLET | ORAL | Status: DC | PRN

## 2012-11-13 MED ORDER — METHYLDOPA 250 MG PO TABS
250.0000 mg | ORAL_TABLET | Freq: Three times a day (TID) | ORAL | Status: DC
  Filled 2012-11-13 (×3): qty 1

## 2012-11-13 MED ORDER — INSULIN ASPART 100 UNIT/ML ~~LOC~~ SOLN
8.0000 [IU] | Freq: Every day | SUBCUTANEOUS | Status: DC
  Administered 2012-11-14: 8 [IU] via SUBCUTANEOUS

## 2012-11-13 MED ORDER — BENZOCAINE-MENTHOL 20-0.5 % EX AERO
1.0000 "application " | INHALATION_SPRAY | CUTANEOUS | Status: DC | PRN
  Administered 2012-11-13: 1 via TOPICAL
  Filled 2012-11-13: qty 56

## 2012-11-13 MED ORDER — TETANUS-DIPHTH-ACELL PERTUSSIS 5-2.5-18.5 LF-MCG/0.5 IM SUSP: 0.5000 mL | Freq: Once | INTRAMUSCULAR | Status: DC

## 2012-11-13 MED ORDER — INSULIN ASPART PROT & ASPART (70-30 MIX) 100 UNIT/ML ~~LOC~~ SUSP: 10.0000 [IU] | Freq: Two times a day (BID) | SUBCUTANEOUS | Status: DC

## 2012-11-13 MED ORDER — FENTANYL 2.5 MCG/ML BUPIVACAINE 1/10 % EPIDURAL INFUSION (WH - ANES)
INTRAMUSCULAR | Status: DC | PRN
  Administered 2012-11-13: 14 mL/h via EPIDURAL

## 2012-11-13 MED ORDER — INSULIN GLARGINE 100 UNIT/ML ~~LOC~~ SOLN
50.0000 [IU] | Freq: Every day | SUBCUTANEOUS | Status: DC
  Administered 2012-11-13 – 2012-11-14 (×2): 50 [IU] via SUBCUTANEOUS
  Filled 2012-11-13 (×2): qty 0.5

## 2012-11-13 MED ORDER — INSULIN ASPART 100 UNIT/ML ~~LOC~~ SOLN
10.0000 [IU] | Freq: Two times a day (BID) | SUBCUTANEOUS | Status: DC
  Administered 2012-11-13 – 2012-11-14 (×3): 10 [IU] via SUBCUTANEOUS

## 2012-11-13 MED ORDER — DIBUCAINE 1 % RE OINT: 1.0000 "application " | TOPICAL_OINTMENT | RECTAL | Status: DC | PRN

## 2012-11-13 NOTE — Progress Notes (Signed)
Insulin/D5LR IV line out during pushing, Pitocin bolus started, will restart insulin drip per orders after pitocin bolus completed, per Dawson,CNM orders

## 2012-11-13 NOTE — Anesthesia Procedure Notes (Signed)
Epidural Patient location during procedure: OB Start time: 11/13/2012 3:39 AM  Staffing Anesthesiologist: Kineta Fudala A. Performed by: anesthesiologist   Preanesthetic Checklist Completed: patient identified, site marked, surgical consent, pre-op evaluation, timeout performed, IV checked, risks and benefits discussed and monitors and equipment checked  Epidural Patient position: sitting Prep: site prepped and draped and DuraPrep Patient monitoring: continuous pulse ox and blood pressure Approach: midline Injection technique: LOR air  Needle:  Needle type: Tuohy  Needle gauge: 17 G Needle length: 9 cm and 9 Needle insertion depth: 8 cm Catheter type: closed end flexible Catheter size: 19 Gauge Catheter at skin depth: 13 cm Test dose: negative and Other  Assessment Events: blood not aspirated, injection not painful, no injection resistance, negative IV test and no paresthesia  Additional Notes Patient identified. Risks and benefits discussed including failed block, incomplete  Pain control, post dural puncture headache, nerve damage, paralysis, blood pressure Changes, nausea, vomiting, reactions to medications-both toxic and allergic and post Partum back pain. All questions were answered. Patient expressed understanding and wished to proceed. Sterile technique was used throughout procedure. Epidural site was Dressed with sterile barrier dressing. No paresthesias, signs of intravascular injection Or signs of intrathecal spread were encountered.  Patient was more comfortable after the epidural was dosed. Please see RN's note for documentation of vital signs and FHR which are stable.

## 2012-11-13 NOTE — Progress Notes (Signed)
Kathleen Jordan is a 42 y.o. G3P0020 at [redacted]w[redacted]d by ultrasound admitted for induction of labor due to Diabetes and Hypertension.  Subjective: No chief complaint on file.  Epidural Comfortable Pitocin 12 MIU Objective: BP 171/87  Pulse 94  Temp(Src) 98.1 F (36.7 C) (Oral)  Resp 18  Ht 5\' 8"  (1.727 m)  Wt 116.574 kg (257 lb)  BMI 39.09 kg/m2  SpO2 100%  LMP 02/15/2012      FHT:  FHR: 145-150 bpm, variability: minimal ,  accelerations:  Abscent,  decelerations:  Present early deceleration UC:   irregular, every 2-4 minutes SVE:  5/100/+1  Labs: Lab Results  Component Value Date   WBC 8.8 11/13/2012   HGB 12.7 11/13/2012   HCT 36.3 11/13/2012   MCV 86.0 11/13/2012   PLT 121* 11/13/2012   BS<120 Assessment / Plan: Induction of labor due to DM, HTN,  progressing well on pitocin AMA P) cont increase pitocin. Exaggerated right sims.  Cont aldomet cont BS testing/adjustment on insulin drip  Anticipated MOD:  NSVD  Kathleen Jordan A 11/13/2012, 5:55 AM

## 2012-11-13 NOTE — Progress Notes (Signed)
Patient ID: Kathleen Jordan, female   DOB: [birth year], 42 y.o.   MRN: [id number] S: Doing well, pain controlled with epidural, (+) pelvic pressure, minimal pushing efforts since 0755   O: Filed Vitals:   11/13/12 1101 11/13/12 1116 11/13/12 1131 11/13/12 1215  BP: 139/103 142/78 143/78 152/79  Pulse: 167 98 93 99  Temp:    97.5 F (36.4 C)  TempSrc:    Oral  Resp:    18  Height:      Weight:      SpO2:    99%     FHT:  FHR: 180 bpm, variability: moderate,  accelerations:  Present,  decelerations:  Absent, early variables with pushing UC:   regular, every 2-4 minutes SVE:   Dilation: 10 Effacement (%): 100 Station: +2 Exam by:: felkelr,n   A / P: Induction of labor due to gestational diabetes and suspected LGA,  progressing well on pitocin  Fetal Wellbeing:  Category I Pain Control:  Epidural  Anticipated MOD:  NSVD  Dr. Cherly Hensen notified of progress - agrees to continue with MOD.  Kenard Gower, MSN, CNM 11/13/2012, 1:07 PM

## 2012-11-13 NOTE — Progress Notes (Signed)
S:  C/o painful ctx S/p nubain x 1  O:  Pitocin 20 MIU VS BP 145/85  VE 1/100/-2 AROM clear fluid IUPC, ISE placed  Tracing: baseline 140 min variability (+) early decel  Ctx q 4 mins  BS 152 IMP: Class B DM on insulin drip Chronic HTN w/o evidence of superimposed preeclampsia on aldomet Latent phase due to suboptimal labor  AMA P) pitocin off in order to obtain CBC prior to epidural. Resume pitocin @ 8 miu. Increase pitocin. Encouraged pt to drink fluid( has not been doing it due to freq bathroom trip. Defer epidural until active labor cont BS monitoring

## 2012-11-14 ENCOUNTER — Other Ambulatory Visit: Payer: Self-pay | Admitting: Family Medicine

## 2012-11-14 LAB — GLUCOSE, CAPILLARY
Glucose-Capillary: 76 mg/dL (ref 70–99)
Glucose-Capillary: 79 mg/dL (ref 70–99)

## 2012-11-14 LAB — CBC
Hemoglobin: 9.9 g/dL — ABNORMAL LOW (ref 12.0–15.0)
MCH: 30.2 pg (ref 26.0–34.0)
MCHC: 34.6 g/dL (ref 30.0–36.0)
Platelets: 135 10*3/uL — ABNORMAL LOW (ref 150–400)
RBC: 3.28 MIL/uL — ABNORMAL LOW (ref 3.87–5.11)

## 2012-11-14 NOTE — Progress Notes (Signed)
Patient ID: Kathleen Jordan, female   DOB: July 12, 1970, 42 y.o.   MRN: [id number] PPD # 1 SVD  S:  Reports feeling better after showering, with a little vaginal soreness              Tolerating po/ No nausea or vomiting             Bleeding is light             Pain controlled with ibuprofen (OTC) and narcotic analgesics including Percocet             Up ad lib / ambulatory / voiding without difficulties    Newborn  Information for the patient's newborn:  Titianna, [place]oom[place] [[id number]]  female  bottle feeding  / Circumcision planning - infant in NICU x 7-10 on abx   O:  A & O x 3, in no apparent distress, very pleasant             VS:  Filed Vitals:   11/13/12 2047 11/13/12 2050 11/13/12 2158 11/14/12 0526  BP: 119/71 108/60 159/92 146/80  Pulse: 89 105 88 80  Temp:    97.8 F (36.6 C)  TempSrc:    Oral  Resp:   20 20  Height:      Weight:      SpO2:    100%    LABS:  Recent Labs  11/13/12 1222 11/14/12 0510  WBC 15.7* 13.9*  HGB 11.1* 9.9*  HCT 32.3* 28.6*  PLT 129* 135*    Blood type:  O POS, O POS (08/14 1345)  Rubella: Immune (12/23 0000)     Lungs: Clear and unlabored  Heart: regular rate and rhythm / no murmurs  Abdomen: soft, non-tender, non-distended, normal bowel sounds             Fundus: firm, non-tender, U-1  Perineum: 2nd degree repair healing well, edema present - ice pack not in place  Lochia: minimal  Extremities: no edema, no calf pain or tenderness, no Homans    A/P: PPD # 1  42 y.o., G4W1027   Principal Problem:    Postpartum care following vaginal delivery  Active Problems:   Hypertension complicating pregnancy  Gestational Diabetes Mellitus, Class B    Doing well - stable status  Routine post partum orders  Resume using ice pack until end of day  Increase hydration  Ambulate  Anticipate discharge tomorrow    Raelyn Mora, M, MSN, CNM 11/14/2012, 12:09 PM

## 2012-11-14 NOTE — Progress Notes (Signed)
Hypoglycemic Event  CBG: 68  Treatment: 15 GM carbohydrate snack  Symptoms: None  Follow-up CBG: Time:1915 CBG Result: 85  Possible Reasons for Event: Inadequate meal intake   Osvaldo Angst, RN---------------------

## 2012-11-14 NOTE — Progress Notes (Signed)
2 hr PP CBG taken 8-16 @ 0030 was 147.

## 2012-11-14 NOTE — Lactation Note (Signed)
This note was copied from the chart of Kathleen Destini Cambre. Lactation Consultation Note  Patient Name: Kathleen Jordan ZOXWR'U Date: 11/14/2012 Reason for consult: Follow-up assessment  Mom has history of breast reduction but mom said the nipple and areola were not removed. Large pendulous breasts with flat nipples; Left nipple erects minimally with stimulation; right remained flat throughout hand expression and pumping.  Extensive scarring noted around both aerolas, @ 6:00 of areolas to chest wall, and extending laterally along the bottom sides of breasts to lateral sides of body.  Mom IDDM.  Mom reports pumped 3 times yesterday evening but not at all during the night; received drops using preemie setting.  Reviewed the need to pump 8 times a day with explanation and encouraged keeping pumping log in NICU booklet.   Reports wanting LC to review hand expression.  LC taught hand expression with return demonstration and observation of colostrum prior to pumping.  Mom began pumping and was getting drops also from pumping.  Taught mom how to use hands-on pumping turning dial up to 3-4 teardrops on preemie setting with hands-on massaging and encouraged to use hand expression at end of pumping session.  Mom has rental packet in room for rental of DEBP tomorrow before discharge.  Reports has already called and ordered a DEBP Medela from insurance company.     Maternal Data Has patient been taught Hand Expression?: Yes   Lactation Tools Discussed/Used Tools: Pump Breast pump type: Double-Electric Breast Pump WIC Program: No Pump Review: Setup, frequency, and cleaning   Consult Status Consult Status: Follow-up Date: 11/15/12 Follow-up type: In-patient    Lendon Ka 11/14/2012, 10:35 AM

## 2012-11-14 NOTE — Anesthesia Postprocedure Evaluation (Signed)
  Anesthesia Post-op Note  Patient: Kathleen Jordan  Procedure(s) Performed: * No procedures listed *  Patient Location: [facility] Unit  Anesthesia Type:Epidural  Level of Consciousness: awake  Airway and Oxygen Therapy: Patient Spontanous Breathing  Post-op Pain: mild  Post-op Assessment: Patient's Cardiovascular Status Stable, Respiratory Function Stable, No signs of Nausea or vomiting, Pain level controlled, No headache, No residual numbness and No residual motor weakness  Post-op Vital Signs: stable  Complications: No apparent anesthesia complications

## 2012-11-15 ENCOUNTER — Encounter (HOSPITAL_COMMUNITY): Payer: Self-pay | Admitting: Obstetrics and Gynecology

## 2012-11-15 ENCOUNTER — Encounter (HOSPITAL_COMMUNITY)
Admission: RE | Admit: 2012-11-15 | Discharge: 2012-11-15 | Disposition: A | Discharge status: Home / Self Care | Payer: BC Managed Care – PPO | Source: Ambulatory Visit | Attending: Obstetrics and Gynecology | Admitting: Obstetrics and Gynecology

## 2012-11-15 ENCOUNTER — Inpatient Hospital Stay (HOSPITAL_COMMUNITY): Admission: RE | Admit: 2012-11-15 | Payer: BC Managed Care – PPO | Source: Ambulatory Visit

## 2012-11-15 DIAGNOSIS — O923 Agalactia: Secondary | ICD-10-CM | POA: Insufficient documentation

## 2012-11-15 LAB — GLUCOSE, CAPILLARY
Glucose-Capillary: 58 mg/dL — ABNORMAL LOW (ref 70–99)
Glucose-Capillary: 62 mg/dL — ABNORMAL LOW (ref 70–99)
Glucose-Capillary: 103 mg/dL — ABNORMAL HIGH (ref 70–99)
Glucose-Capillary: 77 mg/dL (ref 70–99)

## 2012-11-15 MED ORDER — OXYCODONE-ACETAMINOPHEN 5-325 MG PO TABS: 1.0000 | ORAL_TABLET | ORAL | Status: DC | PRN

## 2012-11-15 MED ORDER — DOCUSATE SODIUM 50 MG PO CAPS
100.0000 mg | ORAL_CAPSULE | Freq: Two times a day (BID) | ORAL | Status: DC
Start: 2012-11-15 — End: 2012-12-10

## 2012-11-15 MED ORDER — IBUPROFEN 600 MG PO TABS: 600.0000 mg | ORAL_TABLET | Freq: Four times a day (QID) | ORAL | Status: DC

## 2012-11-15 NOTE — Progress Notes (Signed)
Hypoglycemic Event  CBG: 62  Treatment: 15 GM carbohydrate snack  Symptoms: None  Follow-up CBG: Time:0830 CBG Result:103  Possible Reasons for Event:   Comments/MD notified:MD made aware    Raechel Chute  Remember to initiate Hypoglycemia Order Set & complete

## 2012-11-15 NOTE — Discharge Summary (Signed)
Obstetric Discharge Summary Reason for Admission: induction of labor Prenatal Procedures: ultrasound and fetal echo, NST Intrapartum Procedures: spontaneous vaginal delivery Postpartum Procedures: none Complications-Operative and Postpartum: Shoulder dystocia after delivery of fetal head and 2nd degree perineal laceration Hemoglobin  Date Value Range Status  11/14/2012 9.9* 12.0 - 15.0 g/dL Final     HCT  Date Value Range Status  11/14/2012 28.6* 36.0 - 46.0 % Final    Physical Exam:  General: alert, cooperative, no distress and morbidly obese Lochia: appropriate Uterine Fundus: firm, midline, U-2 DVT Evaluation: No evidence of DVT seen on physical exam. Negative Homan's sign. No cords or calf tenderness. Trace calf/Ankle edema is present - wearing restrictive knee socks  Intrapartum Course: Induction @ 38 5/[redacted] weeks gestation due to chronic HTN not well controlled on Aldomet and Class B DM on insulin / (+) 3lb weight gain in 2 days / DM managed with glucose stabilizer / Progressed to complete on Pitocin / pushed x 2+ hours / SVD - shoulder dystocia after delivery of fetal head - relieved with McRoberts maneuver / 2nd degree vaginal-perineal laceration repaired / EBL: / infant to NICU d/t respiratory distress.   Discharge Diagnoses: Term Pregnancy-delivered, Hypertension complicating pregancy and Gestational Diabetes Class B, AMA, shoulder dystocia  Discharge Information: Date: 11/15/2012 Activity: pelvic rest Diet: Carb modified Medications: PNV, Ibuprofen, Colace and Percocet, aldomet Condition: stable Instructions: refer to practice specific booklet Discharge to: home Follow-up Information   Follow up with Coreon Simkins A, MD. Schedule an appointment as soon as possible for a visit in 6 weeks. (You will need to call Monday 8/18 to schedule an appointment with your endocrinologist for Diabetes management)    Specialty:  Obstetrics and Gynecology   Contact  information:   [address] [place] [address] [phone]       Newborn Data: Live born female  Birth Weight: 10 lb 9.9 oz (4817 g) APGAR: 6, 7  Remains in NICU - doing well - breathing and eating well.  Kenard Gower, MSN, CNM 11/15/2012, 8:50 AM

## 2012-11-15 NOTE — Progress Notes (Signed)
Patient ID: Kathleen Jordan, female   DOB: Jul 18, 1970, 42 y.o.   MRN: [id number] Post Partum Day #2            Information for the patient's newborn:  Meily, [place] [[id number]]  female   / circumcision planning  Feeding: bottle - hand expressed breastmilk  Subjective: No HA, SOB, CP, F/C, breast symptoms. Pain well-controlled with ibuprofen and Percocet. Normal vaginal bleeding, no clots.      Objective:  Temp:  [97.9 F (36.6 C)-98.1 F (36.7 C)] 97.9 F (36.6 C) (08/17 0630) Pulse Rate:  [72-89] 77 (08/17 0630) Resp:  [19-20] 20 (08/17 0630) BP: (151-158)/(83-98) 151/83 mmHg (08/17 0630) SpO2:  [100 %] 100 % (08/17 0630)    Recent Labs  11/13/12 1222 11/14/12 0510  WBC 15.7* 13.9*  HGB 11.1* 9.9*  HCT 32.3* 28.6*  PLT 129* 135*   0600: BS= 58;  after carb adjustment= 89 0800: BS= 62; after carb adjustment= 103   Blood type: O POS, O POS (08/14 1345) Rubella: Immune (12/23 0000)    Physical Exam:  General: alert, cooperative and no distress Uterine Fundus: firm Lochia: appropriate Perineum: 2nd degree repair healing well, edema mild - no ice pack in place DVT Evaluation: No evidence of DVT seen on physical exam. Negative Homan's sign. No cords or calf tenderness. Trace edema in lower extremities    Assessment/Plan: PPD # 2 / 42 y.o., [id number] S/P: induced vaginal, Shoulder dystocia after delivery of fetal head, 2nd degree vaginal laceration  Principal Problem:    Postpartum care following vaginal delivery (8/15)  Active Problems:    Hypertension complicating pregnancy  Gestational Diabetes Class B   Normal postpartum exam  Continue current postpartum care  D/C insulin today - call endocrinologist tomorrow for dosage changes  D/C home Dr. Cherly Hensen notified of plan - agrees with plan   LOS: 3 days   Kenard Gower, MSN, CNM 11/15/2012, 8:34 AM

## 2012-11-15 NOTE — Progress Notes (Signed)
Discharge instructions reviewed with patient and significant other.  Both state understanding of home care, activity, medications, continuing to check blood sugars, signs/symptoms to report to MD and return MD follow up visit.  Patient instructed to call endocrinologist to make appointment to follow up on blood sugars and insulin reevaluation.  No home equipment needed.  Patient ambulated for discharge in stable condition with staff without incident.

## 2012-11-15 NOTE — Progress Notes (Signed)
Hypoglycemic Event  CBG: 58  Treatment: 1/2 cup of Regular Ginger Ale  Symptoms: None  Follow-up CBG: [id number] CBG Result:89  Possible Reasons for Event: Inadequate Meal intake     Kathleen Jordan  Remember to initiate Hypoglycemia Order Set & complete

## 2012-11-16 LAB — GLUCOSE, CAPILLARY: Glucose-Capillary: 147 mg/dL — ABNORMAL HIGH (ref 70–99)

## 2012-11-16 NOTE — Progress Notes (Signed)
Ur chart review completed.  

## 2012-11-19 ENCOUNTER — Ambulatory Visit: Payer: Self-pay

## 2012-11-19 NOTE — Lactation Note (Signed)
This note was copied from the chart of Kathleen Aneta Hendershott. Lactation Consultation Note    Follow up consult with this mom and baby, in NICU, in rooming in  Room. Mom wanted to try latching baby, who is a large , term baby, to her breast. She has had a breast reduction, is expressing about 30 mls every 3 hours, but has very flat nipples. The baby gets frustrated at not feeling the bottle nipple he has become accustomed to. I applied a 24 nipple shield, and placed some formula in the shield with a curved tip syringe. He latched and suckled for a good 5 minutes. The shield would not catch mom's nipple, so I tried both a 20 and 16 - none would maintain suction. I did try sns under the shield with the 24, but due to the shield moving, the milk was leaking and the baby was frustrated  - milk going all over his face. We decided to stop and just let mom bottle feed. I reviewed care and use of nipple shields and SNS with mom, and she will call for additional latch help later today, if she wants to continue trying. Otherwise, she will continue to pump and bottle feed her EBM, and supplement with formula.  Patient Name: Kathleen Jordan ZOXWR'U Date: 11/19/2012 Reason for consult: Follow-up assessment;Difficult latch;NICU baby;Other (Comment) (mom has had a breast reduction)   Maternal Data    Feeding Feeding Type: Breast Milk  LATCH Score/Interventions Latch: Repeated attempts needed to sustain latch, nipple held in mouth throughout feeding, stimulation needed to elicit sucking reflex. (24 nipple shile and SNS) Intervention(s): Adjust position;Assist with latch;Breast massage;Breast compression  Audible Swallowing: Spontaneous and intermittent (with formula in ns - and without - mom leaking breast milk)  Type of Nipple: Flat  Comfort (Breast/Nipple): Soft / non-tender     Hold (Positioning): Assistance needed to correctly position infant at breast and maintain latch. Intervention(s):  Breastfeeding basics reviewed;Support Pillows;Position options;Skin to skin  LATCH Score: 7  Lactation Tools Discussed/Used Tools: Nipple Shields Nipple shield size: 16;20;24   Consult Status Consult Status: Follow-up Date: 11/19/12 Follow-up type: In-patient    Alfred Levins 11/19/2012, 10:20 AM

## 2012-12-10 ENCOUNTER — Encounter: Payer: Self-pay | Admitting: Family Medicine

## 2012-12-10 ENCOUNTER — Ambulatory Visit (INDEPENDENT_AMBULATORY_CARE_PROVIDER_SITE_OTHER): Payer: BC Managed Care – PPO | Admitting: Family Medicine

## 2012-12-10 VITALS — BP 168/104 | HR 88 | Temp 98.1°F | Wt 233.4 lb

## 2012-12-10 DIAGNOSIS — I10 Essential (primary) hypertension: Secondary | ICD-10-CM

## 2012-12-10 DIAGNOSIS — E785 Hyperlipidemia, unspecified: Secondary | ICD-10-CM

## 2012-12-10 MED ORDER — METHYLDOPA 500 MG PO TABS: 500.0000 mg | ORAL_TABLET | Freq: Three times a day (TID) | ORAL | Status: DC

## 2012-12-10 NOTE — Patient Instructions (Addendum)

## 2012-12-10 NOTE — Progress Notes (Signed)
  Subjective:    Patient here for follow-up of elevated blood pressure.  She is not exercising and is adherent to a low-salt diet.  Blood pressure is not well controlled at home. Cardiac symptoms: none. Patient denies: chest pain, chest pressure/discomfort, claudication, dyspnea, exertional chest pressure/discomfort, fatigue, irregular heart beat, lower extremity edema, near-syncope, orthopnea, palpitations, paroxysmal nocturnal dyspnea, syncope and tachypnea. Cardiovascular risk factors: hypertension, obesity (BMI >= 30 kg/m2) and sedentary lifestyle. Use of agents associated with hypertension: none. History of target organ damage: none.  + headaches  The following portions of the patient's history were reviewed and updated as appropriate: allergies, current medications, past family history, past medical history, past social history, past surgical history and problem list.  Review of Systems Pertinent items are noted in HPI.     Objective:    BP 168/104  Pulse 88  Temp(Src) 98.1 F (36.7 C) (Oral)  Wt 233 lb 6.4 oz (105.87 kg)  BMI 35.5 kg/m2  SpO2 97%  Breastfeeding? Yes General appearance: alert, cooperative, appears stated age and no distress Ears: normal TM's and external ear canals both ears Nose: Nares normal. Septum midline. Mucosa normal. No drainage or sinus tenderness. Throat: lips, mucosa, and tongue normal; teeth and gums normal Neck: no adenopathy, no carotid bruit, no JVD, supple, symmetrical, trachea midline and thyroid not enlarged, symmetric, no tenderness/mass/nodules Lungs: clear to auscultation bilaterally Heart: S1, S2 normal Extremities: extremities normal, atraumatic, no cyanosis or edema    Assessment:    Hypertension, stage 1 . Evidence of target organ damage: none.    Plan:    Medication: increase to methydopa 500mg  tid. Dietary sodium restriction. Regular aerobic exercise. Check blood pressures 2-3 times weekly and record. Follow up: 2 weeks and as  needed.

## 2012-12-10 NOTE — Assessment & Plan Note (Signed)
Check labs 

## 2012-12-16 ENCOUNTER — Encounter (HOSPITAL_COMMUNITY)
Admission: RE | Admit: 2012-12-16 | Discharge: 2012-12-16 | Disposition: A | Discharge status: Home / Self Care | Payer: BC Managed Care – PPO | Source: Ambulatory Visit | Attending: Obstetrics and Gynecology | Admitting: Obstetrics and Gynecology

## 2012-12-16 DIAGNOSIS — O923 Agalactia: Secondary | ICD-10-CM | POA: Insufficient documentation

## 2012-12-24 ENCOUNTER — Encounter: Payer: Self-pay | Admitting: Family Medicine

## 2012-12-24 ENCOUNTER — Ambulatory Visit: Payer: BC Managed Care – PPO | Admitting: Family Medicine

## 2012-12-24 VITALS — BP 136/100 | HR 87 | Temp 98.5°F | Wt 239.0 lb

## 2012-12-24 DIAGNOSIS — Z23 Encounter for immunization: Secondary | ICD-10-CM

## 2012-12-24 DIAGNOSIS — I10 Essential (primary) hypertension: Secondary | ICD-10-CM

## 2012-12-24 LAB — BASIC METABOLIC PANEL
CO2: 26 mEq/L (ref 19–32)
Calcium: 9.5 mg/dL (ref 8.4–10.5)
Creatinine, Ser: 0.7 mg/dL (ref 0.4–1.2)
GFR: 121.18 mL/min (ref >60.00)
Sodium: 139 mEq/L (ref 135–145)

## 2012-12-24 MED ORDER — NIFEDIPINE ER OSMOTIC RELEASE 60 MG PO TB24: 60.0000 mg | ORAL_TABLET | Freq: Every day | ORAL | Status: DC

## 2012-12-24 NOTE — Patient Instructions (Signed)

## 2012-12-27 ENCOUNTER — Encounter: Payer: Self-pay | Admitting: Family Medicine

## 2012-12-27 NOTE — Progress Notes (Signed)
  Subjective:    Patient here for follow-up of elevated blood pressure.  She is not exercising and is adherent to a low-salt diet.  Blood pressure is well controlled at home. Cardiac symptoms: none. Patient denies: chest pain, chest pressure/discomfort, claudication, dyspnea, exertional chest pressure/discomfort, fatigue, irregular heart beat, lower extremity edema, near-syncope, orthopnea, palpitations, paroxysmal nocturnal dyspnea, syncope and tachypnea. Cardiovascular risk factors: hypertension, obesity (BMI >= 30 kg/m2) and sedentary lifestyle. Use of agents associated with hypertension: none. History of target organ damage: none.  The following portions of the patient's history were reviewed and updated as appropriate: allergies, current medications, past family history, past medical history, past social history, past surgical history and problem list.  Review of Systems Pertinent items are noted in HPI.     Objective:    BP 136/100  Pulse 87  Temp(Src) 98.5 F (36.9 C) (Oral)  Wt 239 lb (108.41 kg)  BMI 36.35 kg/m2  SpO2 97% General appearance: alert, cooperative and no distress Lungs: clear to auscultation bilaterally Heart: S1, S2 normal Extremities: extremities normal, atraumatic, no cyanosis or edema    Assessment:    Hypertension, normal blood pressure . Evidence of target organ damage: none.    Plan:    Medication: see meds and orders. Dietary sodium restriction. Regular aerobic exercise. Check blood pressures 2-3 times weekly and record. Follow up: 3 weeks and as needed.

## 2012-12-31 ENCOUNTER — Ambulatory Visit (INDEPENDENT_AMBULATORY_CARE_PROVIDER_SITE_OTHER): Payer: BC Managed Care – PPO | Admitting: Family Medicine

## 2012-12-31 ENCOUNTER — Encounter: Payer: Self-pay | Admitting: Family Medicine

## 2012-12-31 VITALS — BP 118/82 | HR 93 | Temp 98.2°F | Wt 239.0 lb

## 2012-12-31 DIAGNOSIS — J302 Other seasonal allergic rhinitis: Secondary | ICD-10-CM

## 2012-12-31 DIAGNOSIS — J309 Allergic rhinitis, unspecified: Secondary | ICD-10-CM

## 2012-12-31 MED ORDER — AZELASTINE-FLUTICASONE 137-50 MCG/ACT NA SUSP: 1.0000 | Freq: Two times a day (BID) | NASAL | Status: DC

## 2012-12-31 NOTE — Patient Instructions (Addendum)
Allergic Rhinitis Allergic rhinitis is when the mucous membranes in the nose respond to allergens. Allergens are particles in the air that cause your body to have an allergic reaction. This causes you to release allergic antibodies. Through a chain of events, these eventually cause you to release histamine into the blood stream (hence the use of antihistamines). Although meant to be protective to the body, it is this release that causes your discomfort, such as frequent sneezing, congestion and an itchy runny nose.  CAUSES  The pollen allergens may come from grasses, trees, and weeds. This is seasonal allergic rhinitis, or "hay fever." Other allergens cause year-round allergic rhinitis (perennial allergic rhinitis) such as house dust mite allergen, pet dander and mold spores.  SYMPTOMS   Nasal stuffiness (congestion).  Runny, itchy nose with sneezing and tearing of the eyes.  There is often an itching of the mouth, eyes and ears. It cannot be cured, but it can be controlled with medications. DIAGNOSIS  If you are unable to determine the offending allergen, skin or blood testing may find it. TREATMENT   Avoid the allergen.  Medications and allergy shots (immunotherapy) can help.  Hay fever may often be treated with antihistamines in pill or nasal spray forms. Antihistamines block the effects of histamine. There are over-the-counter medicines that may help with nasal congestion and swelling around the eyes. Check with your caregiver before taking or giving this medicine. If the treatment above does not work, there are many new medications your caregiver can prescribe. Stronger medications may be used if initial measures are ineffective. Desensitizing injections can be used if medications and avoidance fails. Desensitization is when a patient is given ongoing shots until the body becomes less sensitive to the allergen. Make sure you follow up with your caregiver if problems continue. SEEK MEDICAL  CARE IF:   You develop fever (more than 100.5 F (38.1 C).  You develop a cough that does not stop easily (persistent).  You have shortness of breath.  You start wheezing.  Symptoms interfere with normal daily activities. Document Released: 12/11/2000 Document Revised: 06/10/2011 Document Reviewed: 06/22/2008 [employer] Patient Information 2014 [employer].  

## 2012-12-31 NOTE — Progress Notes (Signed)
  Subjective:     Kathleen Jordan is a 42 y.o. female who presents for evaluation of symptoms of a URI. Symptoms include bilateral ear pressure/pain, congestion, nasal congestion, no  fever, non productive cough, post nasal drip, sinus pressure and sneezing. Onset of symptoms was 3 days ago, and has been gradually worsening since that time. Treatment to date: none.  The following portions of the patient's history were reviewed and updated as appropriate: allergies, current medications, past family history, past medical history, past social history, past surgical history and problem list.  Review of Systems Pertinent items are noted in HPI.   Objective:    BP 118/82  Pulse 93  Temp(Src) 98.2 F (36.8 C) (Oral)  Wt 239 lb (108.41 kg)  BMI 36.35 kg/m2  SpO2 97% General appearance: alert, cooperative, appears stated age and no distress Ears: normal TM's and external ear canals both ears Nose: Nares normal. Septum midline. Mucosa normal. No drainage or sinus tenderness., clear discharge, mild congestion, turbinates red, swollen, no sinus tenderness Throat: lips, mucosa, and tongue normal; teeth and gums normal Neck: no adenopathy, supple, symmetrical, trachea midline and thyroid not enlarged, symmetric, no tenderness/mass/nodules Lungs: clear to auscultation bilaterally Heart: S1, S2 normal   Assessment:    allergic rhinitis   Plan:    Discussed diagnosis and treatment of URI. Suggested symptomatic OTC remedies. Nasal saline spray for congestion. Nasal steroids per orders. Follow up as needed.

## 2013-01-01 LAB — ~~LOC~~ ALLERGY PANEL
Allergen, Cedar tree, t12: <0.1 kU/L
Allergen, Comm Silver Birch, t9: <0.1 kU/L
Allergen, D pternoyssinus,d7: 0.1 kU/L — ABNORMAL HIGH
Allergen, Mulberry, t76: <0.1 kU/L
Alternaria Alternata: <0.1 kU/L
Aspergillus fumigatus, m3: <0.1 kU/L
Bahia Grass: <0.1 kU/L
Bermuda Grass: <0.1 kU/L
Box Elder IgE: <0.1 kU/L
Cat Dander: <0.1 kU/L
Cladosporium Herbarum: <0.1 kU/L
Cockroach: <0.1 kU/L
Common Ragweed: <0.1 kU/L
D. farinae: <0.1 kU/L
Dog Dander: <0.1 kU/L
Elm IgE: <0.1 kU/L
Johnson Grass: <0.1 kU/L
Mucor Racemosus: <0.1 kU/L
Mugwort: <0.1 kU/L
Nettle: <0.1 kU/L
Oak: <0.1 kU/L
Pecan/Hickory Tree IgE: <0.1 kU/L
Penicillium Notatum: <0.1 kU/L
Plantain: <0.1 kU/L
Rough Pigweed  IgE: <0.1 kU/L
Sheep Sorrel IgE: <0.1 kU/L
Stemphylium Botryosum: <0.1 kU/L
Sweet Gum: <0.1 kU/L
Timothy Grass: <0.1 kU/L

## 2013-01-08 ENCOUNTER — Other Ambulatory Visit: Payer: Self-pay

## 2013-01-08 DIAGNOSIS — Z1231 Encounter for screening mammogram for malignant neoplasm of breast: Secondary | ICD-10-CM

## 2013-01-10 ENCOUNTER — Other Ambulatory Visit: Payer: Self-pay | Admitting: Family Medicine

## 2013-01-14 ENCOUNTER — Telehealth: Payer: Self-pay | Admitting: *Deleted

## 2013-01-14 MED ORDER — CEFUROXIME AXETIL 500 MG PO TABS: 500.0000 mg | ORAL_TABLET | Freq: Two times a day (BID) | ORAL | Status: DC

## 2013-01-14 NOTE — Telephone Encounter (Signed)
Rx sent and the patient has been made aware     KP 

## 2013-01-14 NOTE — Telephone Encounter (Signed)
ceftin 500 mg bid for 10 days ---ov if no better after that

## 2013-01-14 NOTE — Telephone Encounter (Signed)
Any fever?  Colored mucus?  Sinus pain?  Etc.  Need more info

## 2013-01-14 NOTE — Telephone Encounter (Signed)
No fever, itchy eyes, not coughing up any mucous, some facial pressure with drainage.

## 2013-01-14 NOTE — Telephone Encounter (Signed)
Pt called and stated that she is not feeling any better than her last visit which was on 12/31/2012. Pt stated that she was told to switch from Zyrtec to Allegra. She says that the Allegra wasn't working so she went back to the Zyrtec. Pt is complaining of cold-like symptoms, running nose. Pt also states that her infant son is now sick, so that's why she believes that is more than allergies. Would like to know what else can be done or what else can she take.  SW. CMA

## 2013-01-15 ENCOUNTER — Ambulatory Visit: Payer: BC Managed Care – PPO | Admitting: Pulmonary Disease

## 2013-01-15 ENCOUNTER — Encounter (HOSPITAL_COMMUNITY)
Admission: RE | Admit: 2013-01-15 | Discharge: 2013-01-15 | Disposition: A | Discharge status: Home / Self Care | Payer: BC Managed Care – PPO | Source: Ambulatory Visit | Attending: Obstetrics and Gynecology | Admitting: Obstetrics and Gynecology

## 2013-01-15 DIAGNOSIS — O923 Agalactia: Secondary | ICD-10-CM | POA: Insufficient documentation

## 2013-01-27 ENCOUNTER — Telehealth: Payer: Self-pay | Admitting: *Deleted

## 2013-01-27 NOTE — Telephone Encounter (Signed)
She was told she needed an ov if no better after abx

## 2013-01-27 NOTE — Telephone Encounter (Signed)
Left patient a voicemail informing her that she needs to be seen. SW

## 2013-01-27 NOTE — Telephone Encounter (Signed)
Patient states that she is not feeling any better than before. She has about 3 days left of antibiotics that is left for her to take. Patient would like to know at what point would she start feeling better? Please advise. SW

## 2013-01-28 NOTE — Telephone Encounter (Signed)
Called patient again this morning to see if she was feeling any better. SW

## 2013-02-04 ENCOUNTER — Ambulatory Visit: Payer: BC Managed Care – PPO

## 2013-02-09 ENCOUNTER — Emergency Department (HOSPITAL_COMMUNITY)
Admission: EM | Admit: 2013-02-09 | Discharge: 2013-02-09 | Disposition: A | Discharge status: Home / Self Care | Payer: BC Managed Care – PPO | Attending: Emergency Medicine | Admitting: Emergency Medicine

## 2013-02-09 ENCOUNTER — Encounter (HOSPITAL_COMMUNITY): Payer: Self-pay | Admitting: Emergency Medicine

## 2013-02-09 DIAGNOSIS — Z791 Long term (current) use of non-steroidal anti-inflammatories (NSAID): Secondary | ICD-10-CM | POA: Insufficient documentation

## 2013-02-09 DIAGNOSIS — Z794 Long term (current) use of insulin: Secondary | ICD-10-CM | POA: Insufficient documentation

## 2013-02-09 DIAGNOSIS — E119 Type 2 diabetes mellitus without complications: Secondary | ICD-10-CM | POA: Insufficient documentation

## 2013-02-09 DIAGNOSIS — Z8619 Personal history of other infectious and parasitic diseases: Secondary | ICD-10-CM | POA: Insufficient documentation

## 2013-02-09 DIAGNOSIS — E669 Obesity, unspecified: Secondary | ICD-10-CM | POA: Insufficient documentation

## 2013-02-09 DIAGNOSIS — Z8742 Personal history of other diseases of the female genital tract: Secondary | ICD-10-CM | POA: Insufficient documentation

## 2013-02-09 DIAGNOSIS — I10 Essential (primary) hypertension: Secondary | ICD-10-CM | POA: Insufficient documentation

## 2013-02-09 DIAGNOSIS — M79609 Pain in unspecified limb: Secondary | ICD-10-CM | POA: Insufficient documentation

## 2013-02-09 DIAGNOSIS — M545 Low back pain, unspecified: Secondary | ICD-10-CM | POA: Insufficient documentation

## 2013-02-09 DIAGNOSIS — Z79899 Other long term (current) drug therapy: Secondary | ICD-10-CM | POA: Insufficient documentation

## 2013-02-09 DIAGNOSIS — Z8709 Personal history of other diseases of the respiratory system: Secondary | ICD-10-CM | POA: Insufficient documentation

## 2013-02-09 MED ORDER — TRAMADOL HCL 50 MG PO TABS: 50.0000 mg | ORAL_TABLET | Freq: Four times a day (QID) | ORAL | Status: DC | PRN

## 2013-02-09 MED ORDER — NAPROXEN 500 MG PO TABS: 500.0000 mg | ORAL_TABLET | Freq: Two times a day (BID) | ORAL | Status: DC

## 2013-02-09 MED ORDER — METHOCARBAMOL 500 MG PO TABS: 500.0000 mg | ORAL_TABLET | Freq: Two times a day (BID) | ORAL | Status: DC

## 2013-02-09 NOTE — ED Provider Notes (Signed)
CSN: [id number]     Arrival date & time 02/09/13  1040 History  This chart was scribed for non-physician practitioner working with Dagmar Hait, MD by Ashley Jacobs, ED scribe. This patient was seen in room WTR5/WTR5 and the patient's care was started at 1:09 PM.  First MD Initiated Contact with Patient 02/09/13 1103     Chief Complaint  Patient presents with  . Leg Pain    left  . Hip Pain    left   (Consider location/radiation/quality/duration/timing/severity/associated sxs/prior Treatment) Patient is a 42 y.o. female presenting with leg pain. The history is provided by medical records. No language interpreter was used.  Leg Pain Location:  Hip and leg Hip location:  L hip Leg location:  L leg Pain details:    Quality:  Shooting   Radiates to:  L leg   Severity:  Moderate   Onset quality:  Sudden   Timing:  Constant   Progression:  Unchanged Chronicity:  New Foreign body present:  No foreign bodies Relieved by:  Nothing Worsened by:  Nothing tried Risk factors: obesity    HPI Comments: Glennys Tavia Stave is a 42 y.o. female who presents to the Emergency Department complaining of left hip and left leg pain that occurred today while she was bending down to pick up her son. Pt explains the pain is shooting and radiates from her hip to her lower left leg. Pain worse with movement and ambulation.  She denies numbness or tingling.  Denies bowel or bladder incontinence.  Denies fever or chills.  She has not taken anything for pain prior to arrival.   She does not smoke tobacco or drink alcohol  Past Medical History  Diagnosis Date  . Hypertension   . Hyperlipidemia   . PCOS (polycystic ovarian syndrome)   . Allergic rhinitis   . Obesity   . Hx of varicella   . Hypopotassemia   . Diabetes mellitus     insulin  . Sleep apnea   . Difficult intubation     pt states with breast reduction surgery MD's had difficulty  . Postpartum care following vaginal delivery  11/13/2012  . Postpartum care following vaginal delivery (8/15) 11/13/2012   Past Surgical History  Procedure Laterality Date  . Breast reduction surgery  07-2003  . Invitro fertilization  11/16/2010    [facility]   Family History  Problem Relation Age of Onset  . Arthritis Mother   . Diabetes Mother   . Hypertension Mother   . Heart disease Mother 56    MI  . Diabetes Brother   . Hypertension Brother   . Arthritis Maternal Grandmother   . Diabetes Maternal Grandmother   . Hypertension Maternal Grandmother   . Cancer Maternal Grandmother   . Stroke Maternal Grandfather   . Cancer Maternal Grandfather   . Asthma Sister   . Allergies Sister    History  Substance Use Topics  . Smoking status: Never Smoker   . Smokeless tobacco: Never Used  . Alcohol Use: No   OB History   Grav Para Term Preterm Abortions TAB SAB Ect Mult Living   3 1 1  0 2 0 2 0 0 1     Review of Systems  All other systems reviewed and are negative.    Allergies  Review of patient's allergies indicates no known allergies.  Home Medications   Current Outpatient Rx  Name  Route  Sig  Dispense  Refill  . Azelastine-Fluticasone 137-50 MCG/ACT  SUSP   Nasal   Place 1 spray into the nose 2 (two) times daily as needed (congestion).         . cetirizine (ZYRTEC) 10 MG tablet   Oral   Take 1 tablet (10 mg total) by mouth daily.   30 tablet   11   . ibuprofen (ADVIL,MOTRIN) 600 MG tablet   Oral   Take 600 mg by mouth every 6 (six) hours.         Marland Kitchen NIFEdipine (PROCARDIA XL/ADALAT-CC) 60 MG 24 hr tablet   Oral   Take 1 tablet (60 mg total) by mouth daily.   90 tablet   3   . norethindrone (MICRONOR,CAMILA,ERRIN) 0.35 MG tablet   Oral   Take 1 tablet by mouth daily.         . Prenatal Vit-Fe Fumarate-FA (PRENATAL MULTIVITAMIN) TABS tablet   Oral   Take 1 tablet by mouth daily at 12 noon.         . senna (SENOKOT) 8.6 MG tablet   Oral   Take 1 tablet by mouth daily.         .  methocarbamol (ROBAXIN) 500 MG tablet   Oral   Take 1 tablet (500 mg total) by mouth 2 (two) times daily.   20 tablet   0   . naproxen (NAPROSYN) 500 MG tablet   Oral   Take 1 tablet (500 mg total) by mouth 2 (two) times daily.   30 tablet   0   . traMADol (ULTRAM) 50 MG tablet   Oral   Take 1 tablet (50 mg total) by mouth every 6 (six) hours as needed.   15 tablet   0    BP 129/79  Pulse 82  Temp(Src) 98.2 F (36.8 C) (Oral)  Resp 18  Ht 5' 8.5" (1.74 m)  Wt 240 lb (108.863 kg)  BMI 35.96 kg/m2  SpO2 97% Physical Exam  Nursing note and vitals reviewed. Constitutional: She is oriented to person, place, and time. She appears well-developed and well-nourished. No distress.  HENT:  Head: Normocephalic and atraumatic.  Neck: Normal range of motion. Neck supple.  Cardiovascular: Normal rate, regular rhythm and normal heart sounds.   Pulmonary/Chest: Effort normal and breath sounds normal. No respiratory distress.  Abdominal: Soft. She exhibits no distension.  Musculoskeletal: Normal range of motion.       Left hip: She exhibits normal range of motion, no swelling and no deformity.       Thoracic back: She exhibits normal range of motion, no tenderness, no bony tenderness, no swelling, no edema and no deformity.       Lumbar back: She exhibits tenderness. She exhibits normal range of motion, no bony tenderness, no swelling, no edema and no deformity.  Tenderness to palpation of the left SI joint  Neurological: She is alert and oriented to person, place, and time. She has normal strength. No sensory deficit. Gait normal.  Reflex Scores:      Patellar reflexes are 2+ on the right side and 2+ on the left side.      Achilles reflexes are 2+ on the right side and 2+ on the left side. Skin: Skin is warm and dry.  Psychiatric: She has a normal mood and affect. Her behavior is normal.    ED Course  Procedures (including critical care time) Labs Review Labs Reviewed - No data to  display Imaging Review No results found.  EKG Interpretation   None  MDM   1. Lower back pain    Patient presenting with pain of her left SI joint and lower back that has been present for the past 4 hours after picking up her son.  No neurological deficits and normal neuro exam.  Full ROM of the left hip.  Pain with ROM of the lower back.  Patient can walk but states is painful.  No loss of bowel or bladder control.  No concern for cauda equina.  No fever, night sweats, weight loss, h/o cancer, IVDU.  RICE protocol and pain medicine indicated and discussed with patient. Patient stable for discharge.  Return precautions given.     Santiago Glad, PA-C 02/10/13 1254

## 2013-02-09 NOTE — ED Notes (Signed)
Pt states this morning bent down to pick up son and started having a shooting pain from L hip down L leg, states has not gotten any better, states she can't put pressure on L leg.

## 2013-02-10 NOTE — ED Provider Notes (Signed)
Medical screening examination/treatment/procedure(s) were performed by non-physician practitioner and as supervising physician I was immediately available for consultation/collaboration.  EKG Interpretation   None         Dagmar Hait, MD 02/10/13 1511

## 2013-02-16 ENCOUNTER — Encounter (HOSPITAL_COMMUNITY)
Admission: RE | Admit: 2013-02-16 | Discharge: 2013-02-16 | Disposition: A | Discharge status: Home / Self Care | Payer: BC Managed Care – PPO | Source: Ambulatory Visit | Attending: Obstetrics and Gynecology | Admitting: Obstetrics and Gynecology

## 2013-02-16 DIAGNOSIS — O923 Agalactia: Secondary | ICD-10-CM | POA: Insufficient documentation

## 2013-02-22 ENCOUNTER — Ambulatory Visit: Payer: BC Managed Care – PPO | Admitting: Pulmonary Disease

## 2013-03-11 ENCOUNTER — Ambulatory Visit: Payer: BC Managed Care – PPO

## 2013-03-19 ENCOUNTER — Encounter (HOSPITAL_COMMUNITY)
Admission: RE | Admit: 2013-03-19 | Discharge: 2013-03-19 | Disposition: A | Discharge status: Home / Self Care | Payer: BC Managed Care – PPO | Source: Ambulatory Visit | Attending: Obstetrics and Gynecology | Admitting: Obstetrics and Gynecology

## 2013-03-19 DIAGNOSIS — O923 Agalactia: Secondary | ICD-10-CM | POA: Insufficient documentation

## 2013-04-06 ENCOUNTER — Ambulatory Visit: Payer: BC Managed Care – PPO | Admitting: Family Medicine

## 2013-04-06 DIAGNOSIS — Z0289 Encounter for other administrative examinations: Secondary | ICD-10-CM

## 2013-04-19 ENCOUNTER — Encounter (HOSPITAL_COMMUNITY)
Admission: RE | Admit: 2013-04-19 | Discharge: 2013-04-19 | Disposition: A | Discharge status: Home / Self Care | Payer: BC Managed Care – PPO | Source: Ambulatory Visit | Attending: Obstetrics and Gynecology | Admitting: Obstetrics and Gynecology

## 2013-04-19 ENCOUNTER — Other Ambulatory Visit: Payer: Self-pay

## 2013-04-19 ENCOUNTER — Ambulatory Visit
Admission: RE | Admit: 2013-04-19 | Discharge: 2013-04-19 | Disposition: A | Discharge status: Home / Self Care | Payer: BC Managed Care – PPO | Source: Ambulatory Visit

## 2013-04-19 DIAGNOSIS — O923 Agalactia: Secondary | ICD-10-CM | POA: Insufficient documentation

## 2013-04-19 DIAGNOSIS — Z1231 Encounter for screening mammogram for malignant neoplasm of breast: Secondary | ICD-10-CM

## 2013-05-20 ENCOUNTER — Encounter (HOSPITAL_COMMUNITY)
Admission: RE | Admit: 2013-05-20 | Discharge: 2013-05-20 | Disposition: A | Discharge status: Home / Self Care | Payer: BC Managed Care – PPO | Source: Ambulatory Visit | Attending: Obstetrics and Gynecology | Admitting: Obstetrics and Gynecology

## 2013-05-20 DIAGNOSIS — O923 Agalactia: Secondary | ICD-10-CM | POA: Insufficient documentation

## 2013-06-09 ENCOUNTER — Encounter: Payer: Self-pay | Admitting: Nurse Practitioner

## 2013-06-09 ENCOUNTER — Other Ambulatory Visit: Payer: Self-pay | Admitting: Nurse Practitioner

## 2013-06-09 ENCOUNTER — Ambulatory Visit (INDEPENDENT_AMBULATORY_CARE_PROVIDER_SITE_OTHER): Payer: BC Managed Care – PPO | Admitting: Nurse Practitioner

## 2013-06-09 VITALS — BP 108/73 | HR 74 | Temp 98.4°F | Ht 68.0 in | Wt 242.0 lb

## 2013-06-09 DIAGNOSIS — A084 Viral intestinal infection, unspecified: Secondary | ICD-10-CM

## 2013-06-09 DIAGNOSIS — A088 Other specified intestinal infections: Secondary | ICD-10-CM

## 2013-06-09 DIAGNOSIS — R319 Hematuria, unspecified: Secondary | ICD-10-CM

## 2013-06-09 DIAGNOSIS — R809 Proteinuria, unspecified: Secondary | ICD-10-CM

## 2013-06-09 LAB — URINALYSIS, ROUTINE W REFLEX MICROSCOPIC
Hgb urine dipstick: NEGATIVE
LEUKOCYTES UA: NEGATIVE
Nitrite: NEGATIVE
PH: 6 (ref 5.0–8.0)
RBC / HPF: NONE SEEN (ref 0–?)
Specific Gravity, Urine: >=1.03 — AB (ref 1.000–1.030)
Total Protein, Urine: 30 — AB
URINE GLUCOSE: NEGATIVE
Urobilinogen, UA: 0.2 (ref 0.0–1.0)

## 2013-06-09 LAB — POCT URINALYSIS DIPSTICK
Glucose, UA: NEGATIVE
LEUKOCYTES UA: NEGATIVE
Nitrite, UA: NEGATIVE
PH UA: 6
PROTEIN UA: 30
Spec Grav, UA: >=1.03
Urobilinogen, UA: 0.2

## 2013-06-09 NOTE — Progress Notes (Signed)
   Subjective:    Patient ID: Kathleen Jordan, female    DOB: 1971/03/10, 43 y.o.   MRN: [id number]  Emesis  This is a new problem. The current episode started in the past 7 days (3d, resolved yesterday). The problem occurs 2 to 4 times per day. The problem has been gradually improving. The emesis has an appearance of stomach contents. There has been no fever (reports 99.0 oral 3 da). The fever has been present for less than 1 day. Associated symptoms include abdominal pain (low bilat abd pain), chills and diarrhea. Pertinent negatives include no arthralgias, chest pain, coughing, dizziness, fever, headaches or URI. She has tried nothing for the symptoms.      Review of Systems  Constitutional: Positive for chills. Negative for fever, activity change, appetite change and fatigue.  HENT: Negative for congestion, sneezing and sore throat.   Respiratory: Negative for cough, chest tightness, shortness of breath and wheezing.   Cardiovascular: Negative for chest pain.  Gastrointestinal: Positive for vomiting, abdominal pain (low bilat abd pain) and diarrhea.  Endocrine:       Checked CBS 2 da, w/in nml limits per pt report.  Musculoskeletal: Positive for back pain (c/o bilat flank pain). Negative for arthralgias.  Neurological: Negative for dizziness and headaches.       Objective:   Physical Exam  Vitals reviewed. Constitutional: She is oriented to person, place, and time. She appears well-developed and well-nourished. No distress.  HENT:  Head: Normocephalic and atraumatic.  Right Ear: External ear normal.  Left Ear: External ear normal.  Mouth/Throat: Oropharynx is clear and moist. No oropharyngeal exudate.  bilat effusion, bones visible.  Eyes: Conjunctivae are normal. Right eye exhibits no discharge. Left eye exhibits no discharge.  Neck: Normal range of motion. Neck supple. No thyromegaly present.  Acanthus nigricans posterior neck  Cardiovascular: Normal rate, regular  rhythm and normal heart sounds.   Pulmonary/Chest: Effort normal and breath sounds normal. No respiratory distress. She has no wheezes. She has no rales.  Abdominal: Soft. Bowel sounds are normal. She exhibits no distension and no mass. There is no tenderness. There is no rebound and no guarding.  obese  Musculoskeletal: She exhibits no tenderness (no CVA tenderness).  Lymphadenopathy:    She has no cervical adenopathy.  Neurological: She is alert and oriented to person, place, and time.  Skin: Skin is warm and dry.  Psychiatric: She has a normal mood and affect. Her behavior is normal. Thought content normal.          Assessment & Plan:  1. Viral gastroenteritis Pt reports low grade fever. Nml phys. Exam. Urine concentrated w/ protein. No leuks or nites. - POCT Urinalysis Dipstick-see above. See pt instructions.  2. Hx elevated liver enzymes. Liver not palpable.  Adv pt to f/u. Also time for CPE.

## 2013-06-09 NOTE — Patient Instructions (Addendum)
Your exam is normal. I think you have had a virus or food poisoning. Sip hydrating fluids every hour (water, club soda, noncaffeinated tea, soda). Your urine indicates you are dehydrated. Try to get in 5 to 6 16oz bottles of fluid today. If all symptoms do not resolve in 48 hours, please let us know. Please schedule a physical with Dr Laury AxonLowne. Request Dr Danella PentonBallen's office fax labs that you are having done tomorrow.   Viral Gastroenteritis Viral gastroenteritis is also known as stomach flu. This condition affects the stomach and intestinal tract. It can cause sudden diarrhea and vomiting. The illness typically lasts 3 to 8 days. Most people develop an immune response that eventually gets rid of the virus. While this natural response develops, the virus can make you quite ill. CAUSES  Many different viruses can cause gastroenteritis, such as rotavirus or noroviruses. You can catch one of these viruses by consuming contaminated food or water. You may also catch a virus by sharing utensils or other personal items with an infected person or by touching a contaminated surface. SYMPTOMS  The most common symptoms are diarrhea and vomiting. These problems can cause a severe loss of body fluids (dehydration) and a body salt (electrolyte) imbalance. Other symptoms may include:  Fever.  Headache.  Fatigue.  Abdominal pain. DIAGNOSIS  Your caregiver can usually diagnose viral gastroenteritis based on your symptoms and a physical exam. A stool sample may also be taken to test for the presence of viruses or other infections. TREATMENT  This illness typically goes away on its own. Treatments are aimed at rehydration. The most serious cases of viral gastroenteritis involve vomiting so severely that you are not able to keep fluids down. In these cases, fluids must be given through an intravenous line (IV). HOME CARE INSTRUCTIONS   Drink enough fluids to keep your urine clear or pale yellow. Drink small amounts of  fluids frequently and increase the amounts as tolerated.  Ask your caregiver for specific rehydration instructions.  Avoid:  Foods high in sugar.  Alcohol.  Carbonated drinks.  Tobacco.  Juice.  Caffeine drinks.  Extremely hot or cold fluids.  Fatty, greasy foods.  Too much intake of anything at one time.  Dairy products until 24 to 48 hours after diarrhea stops.  You may consume probiotics. Probiotics are active cultures of beneficial bacteria. They may lessen the amount and number of diarrheal stools in adults. Probiotics can be found in yogurt with active cultures and in supplements.  Wash your hands well to avoid spreading the virus.  Only take over-the-counter or prescription medicines for pain, discomfort, or fever as directed by your caregiver. Do not give aspirin to children. Antidiarrheal medicines are not recommended.  Ask your caregiver if you should continue to take your regular prescribed and over-the-counter medicines.  Keep all follow-up appointments as directed by your caregiver. SEEK IMMEDIATE MEDICAL CARE IF:   You are unable to keep fluids down.  You do not urinate at least once every 6 to 8 hours.  You develop shortness of breath.  You notice blood in your stool or vomit. This may look like coffee grounds.  You have abdominal pain that increases or is concentrated in one small area (localized).  You have persistent vomiting or diarrhea.  You have a fever.  The patient is a child younger than 3 months, and he or she has a fever.  The patient is a child older than 3 months, and he or she has a fever  and persistent symptoms.  The patient is a child older than 3 months, and he or she has a fever and symptoms suddenly get worse.  The patient is a baby, and he or she has no tears when crying. MAKE SURE YOU:   Understand these instructions.  Will watch your condition.  Will get help right away if you are not doing well or get  worse. Document Released: 03/18/2005 Document Revised: 06/10/2011 Document Reviewed: 01/02/2011 [facility] Patient Information 2014 [place], [place].

## 2013-06-09 NOTE — Addendum Note (Signed)
Addended by: Silvio PateHOMPSON, [place] Lasecki D on: 06/09/2013 10:54 AM   Modules accepted: Orders

## 2013-06-09 NOTE — Progress Notes (Signed)
Pre visit review using our clinic review tool, if applicable. No additional management support is needed unless otherwise documented below in the visit note. 

## 2013-06-09 NOTE — Addendum Note (Signed)
Addended by: Silvio PateHOMPSON, Marvon [place] D on: 06/09/2013 11:19 AM   Modules accepted: Orders

## 2013-06-14 ENCOUNTER — Telehealth: Payer: Self-pay

## 2013-06-14 NOTE — Telephone Encounter (Signed)
Medication and allergies:  Reviewed and updated  90 day supply/mail order: n/a Local pharmacy:  [id number] - [place] [place] - [address] [facility] [address]   Immunizations due:  UTD   A/P: No changes to personal, family history or past surgical hx PAP- 12/13/10- negative MMG- 04/19/13-negative Flu- 12/24/12 Tdap- 06/2012 per patient   To Discuss with Provider: States K+ was low throughout pregnancy and wonders if it's still low due muscle cramping after prolonged sitting. States that it's sometimes hard to walk due to cramps. Interested in taking something to help with weight loss.

## 2013-06-15 ENCOUNTER — Encounter: Payer: Self-pay | Admitting: Family Medicine

## 2013-06-15 ENCOUNTER — Ambulatory Visit (INDEPENDENT_AMBULATORY_CARE_PROVIDER_SITE_OTHER): Payer: BC Managed Care – PPO | Admitting: Family Medicine

## 2013-06-15 VITALS — BP 116/72 | HR 83 | Temp 98.3°F | Ht 68.5 in | Wt 244.0 lb

## 2013-06-15 DIAGNOSIS — M545 Low back pain, unspecified: Secondary | ICD-10-CM

## 2013-06-15 DIAGNOSIS — E785 Hyperlipidemia, unspecified: Secondary | ICD-10-CM

## 2013-06-15 DIAGNOSIS — E669 Obesity, unspecified: Secondary | ICD-10-CM | POA: Insufficient documentation

## 2013-06-15 DIAGNOSIS — Z0000 Encounter for general adult medical examination without abnormal findings: Secondary | ICD-10-CM

## 2013-06-15 MED ORDER — COLESEVELAM HCL 625 MG PO TABS: 1875.0000 mg | ORAL_TABLET | Freq: Two times a day (BID) | ORAL | Status: DC

## 2013-06-15 NOTE — Progress Notes (Signed)
Subjective:     Kathleen Jordan is a 43 y.o. female and is here for a comprehensive physical exam. The patient reports problems - DM, cholesterol etc  not controlled since baby.  History   Social History  . Marital Status: Married    Spouse Name: [name]    Number of Children: 0  . Years of Education: N/A   Occupational History  . Psychotherapist     [employer]   Social History Main Topics  . Smoking status: Never Smoker   . Smokeless tobacco: Never Used  . Alcohol Use: No  . Drug Use: No  . Sexual Activity: Yes    Partners: Male   Other Topics Concern  . Not on file   Social History Narrative  . No narrative on file   Health Maintenance  Topic Date Due  . Pneumococcal Polysaccharide Vaccine (##1) 12/23/1974  . Foot Exam  12/23/1982  . Ophthalmology Exam  12/23/1982  . Tetanus/tdap  04/01/2010  . Hemoglobin A1c  08/28/2011  . Urine Microalbumin  11/20/2011  . Influenza Vaccine  10/30/2013  . Pap Smear  12/12/2013    The following portions of the patient's history were reviewed and updated as appropriate:  She  has a past medical history of Hypertension; Hyperlipidemia; PCOS (polycystic ovarian syndrome); Allergic rhinitis; Obesity; varicella; Hypopotassemia; Diabetes mellitus; Sleep apnea; Difficult intubation; Postpartum care following vaginal delivery (11/13/2012); and Postpartum care following vaginal delivery (8/15) (11/13/2012). She  does not have any pertinent problems on file. She  has past surgical history that includes Breast reduction surgery (07-2003) and invitro fertilization (11/16/2010). Her family history includes Allergies in her sister; Arthritis in her maternal grandmother and mother; Asthma in her sister; Cancer in her maternal grandfather and maternal grandmother; Diabetes in her brother, maternal grandmother, and mother; Heart disease (age of onset: 7458) in her mother; Hypertension in her brother, maternal grandmother, and  mother; Stroke in her maternal grandfather. She  reports that she has never smoked. She has never used smokeless tobacco. She reports that she does not drink alcohol or use illicit drugs. She has a current medication list which includes the following prescription(s): azelastine-fluticasone, cetirizine, colesevelam, metformin, nifedipine, nora-be, and prenatal vitamin w/fe, fa. Current Outpatient Prescriptions on File Prior to Visit  Medication Sig Dispense Refill  . Azelastine-Fluticasone 137-50 MCG/ACT SUSP Place 1 spray into the nose 2 (two) times daily as needed (congestion).      . cetirizine (ZYRTEC) 10 MG tablet Take 1 tablet (10 mg total) by mouth daily.  30 tablet  11  . metFORMIN (GLUCOPHAGE) 500 MG tablet Take 500 mg by mouth daily.      Marland Kitchen. NIFEdipine (PROCARDIA XL/ADALAT-CC) 60 MG 24 hr tablet Take 1 tablet (60 mg total) by mouth daily.  90 tablet  3   No current facility-administered medications on file prior to visit.   She has No Known Allergies..  Review of Systems Review of Systems  Constitutional: Negative for activity change, appetite change and fatigue.  HENT: Negative for hearing loss, congestion, tinnitus and ear discharge.  dentist q6140m Eyes: Negative for visual disturbance (see optho q1y -- vision corrected to 20/20 with glasses).  Respiratory: Negative for cough, chest tightness and shortness of breath.   Cardiovascular: Negative for chest pain, palpitations and leg swelling.  Gastrointestinal: Negative for abdominal pain, diarrhea, constipation and abdominal distention.  Genitourinary: Negative for urgency, frequency, decreased urine volume and difficulty urinating.  Musculoskeletal: + for back pain, arthralgias and gait  problem.  Skin: Negative for color change, pallor and rash.  Neurological: Negative for dizziness, light-headedness, numbness and headaches.  Hematological: Negative for adenopathy. Does not bruise/bleed easily.  Psychiatric/Behavioral: Negative for  suicidal ideas, confusion, sleep disturbance, self-injury, dysphoric mood, decreased concentration and agitation.       Objective:    BP 116/72  Pulse 83  Temp(Src) 98.3 F (36.8 C) (Oral)  Ht 5' 8.5" (1.74 m)  Wt 244 lb (110.678 kg)  BMI 36.56 kg/m2  SpO2 97%  LMP 05/06/2013  Breastfeeding? No General appearance: alert, cooperative, appears stated age and no distress Head: Normocephalic, without obvious abnormality, atraumatic Eyes: conjunctivae/corneas clear. PERRL, EOM's intact. Fundi benign. Ears: normal TM's and external ear canals both ears Nose: Nares normal. Septum midline. Mucosa normal. No drainage or sinus tenderness. Throat: lips, mucosa, and tongue normal; teeth and gums normal Neck: no adenopathy, no carotid bruit, no JVD, supple, symmetrical, trachea midline and thyroid not enlarged, symmetric, no tenderness/mass/nodules Back: symmetric, no curvature. ROM normal. No CVA tenderness. Lungs: clear to auscultation bilaterally Breasts: gyn Heart: regular rate and rhythm, S1, S2 normal, no murmur, click, rub or gallop Abdomen: soft, non-tender; bowel sounds normal; no masses,  no organomegaly Pelvic: deferred --gyn Extremities: extremities normal, atraumatic, no cyanosis or edema Pulses: 2+ and symmetric Skin: Skin color, texture, turgor normal. No rashes or lesions Lymph nodes: Cervical, supraclavicular, and axillary nodes normal. Neurologic: Alert and oriented X 3, normal strength and tone. Normal symmetric reflexes. Normal coordination and gait Psych-- no depression, no anxiety      Assessment:    Healthy female exam.      Plan:    ghm utd Check labs See After Visit Summary for Counseling Recommendations    1. Low back pain--since she had the baby Refer to chiropractor - Ambulatory referral to Chiropractic  2. Other and unspecified hyperlipidemia Check labs, restart welchol  3. Preventative health care

## 2013-06-15 NOTE — Progress Notes (Signed)
Pre visit review using our clinic review tool, if applicable. No additional management support is needed unless otherwise documented below in the visit note. 

## 2013-06-15 NOTE — Patient Instructions (Signed)

## 2013-06-19 ENCOUNTER — Encounter (HOSPITAL_COMMUNITY)
Admission: RE | Admit: 2013-06-19 | Discharge: 2013-06-19 | Disposition: A | Discharge status: Home / Self Care | Payer: BC Managed Care – PPO | Source: Ambulatory Visit | Attending: Obstetrics and Gynecology | Admitting: Obstetrics and Gynecology

## 2013-06-19 DIAGNOSIS — O923 Agalactia: Secondary | ICD-10-CM | POA: Insufficient documentation

## 2013-07-08 ENCOUNTER — Other Ambulatory Visit: Payer: Self-pay

## 2013-07-20 ENCOUNTER — Encounter (HOSPITAL_COMMUNITY)
Admission: RE | Admit: 2013-07-20 | Discharge: 2013-07-20 | Disposition: A | Discharge status: Home / Self Care | Payer: BC Managed Care – PPO | Source: Ambulatory Visit | Attending: Obstetrics and Gynecology | Admitting: Obstetrics and Gynecology

## 2013-07-20 DIAGNOSIS — O923 Agalactia: Secondary | ICD-10-CM | POA: Insufficient documentation

## 2013-08-02 ENCOUNTER — Other Ambulatory Visit: Payer: Self-pay | Admitting: Family Medicine

## 2013-08-19 ENCOUNTER — Encounter (HOSPITAL_COMMUNITY)
Admission: RE | Admit: 2013-08-19 | Discharge: 2013-08-19 | Disposition: A | Discharge status: Home / Self Care | Payer: BC Managed Care – PPO | Source: Ambulatory Visit | Attending: Obstetrics and Gynecology | Admitting: Obstetrics and Gynecology

## 2013-08-19 DIAGNOSIS — O923 Agalactia: Secondary | ICD-10-CM | POA: Insufficient documentation

## 2013-09-19 ENCOUNTER — Encounter (HOSPITAL_COMMUNITY)
Admission: RE | Admit: 2013-09-19 | Discharge: 2013-09-19 | Disposition: A | Discharge status: Home / Self Care | Payer: BC Managed Care – PPO | Source: Ambulatory Visit | Attending: Obstetrics and Gynecology | Admitting: Obstetrics and Gynecology

## 2013-09-19 DIAGNOSIS — O923 Agalactia: Secondary | ICD-10-CM | POA: Insufficient documentation

## 2013-11-22 ENCOUNTER — Telehealth: Payer: Self-pay

## 2013-11-22 NOTE — Telephone Encounter (Signed)
Spoke with pt. Pt states that Dr. Talmage Nap with [facility] is helping track and monitor pt DM.   DM bundle pt.

## 2013-12-26 ENCOUNTER — Ambulatory Visit (INDEPENDENT_AMBULATORY_CARE_PROVIDER_SITE_OTHER): Payer: BC Managed Care – PPO | Admitting: Family Medicine

## 2013-12-26 VITALS — BP 124/74 | HR 77 | Temp 98.5°F | Resp 16 | Ht 68.0 in | Wt 249.0 lb

## 2013-12-26 DIAGNOSIS — E785 Hyperlipidemia, unspecified: Secondary | ICD-10-CM

## 2013-12-26 DIAGNOSIS — J019 Acute sinusitis, unspecified: Secondary | ICD-10-CM

## 2013-12-26 DIAGNOSIS — I10 Essential (primary) hypertension: Secondary | ICD-10-CM

## 2013-12-26 DIAGNOSIS — R51 Headache: Secondary | ICD-10-CM

## 2013-12-26 DIAGNOSIS — E118 Type 2 diabetes mellitus with unspecified complications: Secondary | ICD-10-CM

## 2013-12-26 MED ORDER — HYDROCOD POLST-CHLORPHEN POLST 10-8 MG/5ML PO LQCR: 5.0000 mL | Freq: Two times a day (BID) | ORAL | Status: DC | PRN

## 2013-12-26 MED ORDER — GUAIFENESIN ER 1200 MG PO TB12: 1.0000 | ORAL_TABLET | Freq: Two times a day (BID) | ORAL | Status: DC | PRN

## 2013-12-26 MED ORDER — AMOXICILLIN-POT CLAVULANATE 875-125 MG PO TABS: 1.0000 | ORAL_TABLET | Freq: Two times a day (BID) | ORAL | Status: DC

## 2013-12-26 NOTE — Patient Instructions (Signed)

## 2013-12-26 NOTE — Progress Notes (Signed)
Subjective:    Patient ID: Kathleen Jordan, female    DOB: 05/09/1970, 43 y.o.   MRN: [id number] Chief Complaint  Patient presents with  . Otalgia    x 1 week  . Headache  . Facial Pain    HPI  Started having cold sxs with congestion and cough this week then this morning a HA woke her from sleep - having face, ear, sinus pain, - head feels very heavy.  No focal pain.  Has been using mucinex and azelastine-flucticasone nasal spray. Tried some tylenol.  No f/c, rhinitis is thick yellow with PND and cough is productive of the same.  Not sleeping well at baseline. No netti pot but does have a humidifier in her home. cbgs ok. A little nausea from all the mucous in her stomach.  Past Medical History  Diagnosis Date  . Hypertension   . Hyperlipidemia   . PCOS (polycystic ovarian syndrome)   . Allergic rhinitis   . Obesity   . Hx of varicella   . Hypopotassemia   . Diabetes mellitus     insulin  . Sleep apnea   . Difficult intubation     pt states with breast reduction surgery MD's had difficulty  . Postpartum care following vaginal delivery 11/13/2012  . Postpartum care following vaginal delivery (8/15) 11/13/2012   Current Outpatient Prescriptions on File Prior to Visit  Medication Sig Dispense Refill  . Azelastine-Fluticasone 137-50 MCG/ACT SUSP Place 1 spray into the nose 2 (two) times daily as needed (congestion).      . cetirizine (ZYRTEC) 10 MG tablet TAKE 1 TABLET BY MOUTH DAILY  30 tablet  11  . colesevelam (WELCHOL) 625 MG tablet Take 3 tablets (1,875 mg total) by mouth 2 (two) times daily with a meal.  180 tablet  5  . metFORMIN (GLUCOPHAGE) 500 MG tablet Take 500 mg by mouth daily.      Marland Kitchen NIFEdipine (PROCARDIA XL/ADALAT-CC) 60 MG 24 hr tablet Take 1 tablet (60 mg total) by mouth daily.  90 tablet  3  . NORA-BE 0.35 MG tablet       . prenatal vitamin w/FE, FA (PRENATAL 1 + 1) 27-1 MG TABS tablet        No current facility-administered medications on file prior to  visit.   No Known Allergies     Review of Systems  Constitutional: Positive for fatigue. Negative for fever, chills, diaphoresis, activity change and appetite change.  HENT: Positive for congestion, ear pain, postnasal drip, rhinorrhea, sinus pressure, sneezing and sore throat. Negative for ear discharge, facial swelling, nosebleeds, trouble swallowing and voice change.   Eyes: Negative for discharge and itching.  Respiratory: Positive for cough. Negative for shortness of breath.   Cardiovascular: Negative for chest pain.  Gastrointestinal: Positive for nausea. Negative for vomiting and abdominal pain.  Genitourinary: Negative for urgency and decreased urine volume.  Musculoskeletal: Negative for neck pain and neck stiffness.  Skin: Negative for rash.  Neurological: Positive for headaches. Negative for dizziness and syncope.  Hematological: Positive for adenopathy.  Psychiatric/Behavioral: Positive for sleep disturbance.       Objective:  BP 124/74  Pulse 77  Temp(Src) 98.5 F (36.9 C) (Oral)  Resp 16  Ht  (1.727 m)  Wt 249 lb (112.946 kg)  BMI 37.87 kg/m2  SpO2 98%  LMP 10/28/2013  Physical Exam  Constitutional: She is oriented to person, place, and time. She appears well-developed and well-nourished. She appears lethargic. She appears ill. No  distress.  HENT:  Head: Normocephalic and atraumatic.  Right Ear: External ear and ear canal normal. Tympanic membrane is retracted. A middle ear effusion is present.  Left Ear: External ear and ear canal normal. Tympanic membrane is retracted. A middle ear effusion is present.  Nose: Mucosal edema and rhinorrhea present. Right sinus exhibits maxillary sinus tenderness. Left sinus exhibits maxillary sinus tenderness.  Mouth/Throat: Uvula is midline and mucous membranes are normal. Posterior oropharyngeal erythema present. No oropharyngeal exudate, posterior oropharyngeal edema or tonsillar abscesses.  Eyes: Conjunctivae are  normal. Right eye exhibits no discharge. Left eye exhibits no discharge. No scleral icterus.  Neck: Normal range of motion. Neck supple.  Cardiovascular: Normal rate, regular rhythm, normal heart sounds and intact distal pulses.   Pulmonary/Chest: Effort normal and breath sounds normal.  Lymphadenopathy:       Head (right side): Submandibular adenopathy present. No preauricular and no posterior auricular adenopathy present.       Head (left side): Submandibular adenopathy present. No preauricular and no posterior auricular adenopathy present.    She has no cervical adenopathy.       Right: No supraclavicular adenopathy present.       Left: No supraclavicular adenopathy present.  Neurological: She is oriented to person, place, and time. She appears lethargic.  Skin: Skin is warm and dry. She is not diaphoretic. No erythema.  Psychiatric: She has a normal mood and affect. Her behavior is normal.          Assessment & Plan:   SINUSITIS- ACUTE-NOS  HYPERTENSION  HYPERLIPIDEMIA  HEADACHE  Type 2 diabetes mellitus with complication  Meds ordered this encounter  Medications  . amoxicillin-clavulanate (AUGMENTIN) 875-125 MG per tablet    Sig: Take 1 tablet by mouth 2 (two) times daily.    Dispense:  20 tablet    Refill:  0  . chlorpheniramine-HYDROcodone (TUSSIONEX PENNKINETIC ER) 10-8 MG/5ML LQCR    Sig: Take 5 mLs by mouth every 12 (twelve) hours as needed.    Dispense:  60 mL    Refill:  0  . Guaifenesin (MUCINEX MAXIMUM STRENGTH) 1200 MG TB12    Sig: Take 1 tablet (1,200 mg total) by mouth every 12 (twelve) hours as needed.    Dispense:  14 tablet    Refill:  1   Norberto Sorenson, MD MPH

## 2014-01-14 ENCOUNTER — Other Ambulatory Visit: Payer: Self-pay

## 2014-01-21 ENCOUNTER — Other Ambulatory Visit: Payer: Self-pay | Admitting: Family Medicine

## 2014-01-21 NOTE — Telephone Encounter (Signed)
Patient states that she is out of this medication °

## 2014-02-25 ENCOUNTER — Emergency Department (HOSPITAL_BASED_OUTPATIENT_CLINIC_OR_DEPARTMENT_OTHER)
Admission: EM | Admit: 2014-02-25 | Discharge: 2014-02-26 | Disposition: A | Discharge status: Home / Self Care | Payer: BC Managed Care – PPO | Attending: Emergency Medicine | Admitting: Emergency Medicine

## 2014-02-25 ENCOUNTER — Encounter (HOSPITAL_BASED_OUTPATIENT_CLINIC_OR_DEPARTMENT_OTHER): Payer: Self-pay | Admitting: Emergency Medicine

## 2014-02-25 DIAGNOSIS — R1032 Left lower quadrant pain: Secondary | ICD-10-CM | POA: Insufficient documentation

## 2014-02-25 DIAGNOSIS — Z3202 Encounter for pregnancy test, result negative: Secondary | ICD-10-CM | POA: Diagnosis not present

## 2014-02-25 DIAGNOSIS — Z8669 Personal history of other diseases of the nervous system and sense organs: Secondary | ICD-10-CM | POA: Insufficient documentation

## 2014-02-25 DIAGNOSIS — E669 Obesity, unspecified: Secondary | ICD-10-CM | POA: Insufficient documentation

## 2014-02-25 DIAGNOSIS — J309 Allergic rhinitis, unspecified: Secondary | ICD-10-CM | POA: Insufficient documentation

## 2014-02-25 DIAGNOSIS — E119 Type 2 diabetes mellitus without complications: Secondary | ICD-10-CM | POA: Insufficient documentation

## 2014-02-25 DIAGNOSIS — R1084 Generalized abdominal pain: Secondary | ICD-10-CM

## 2014-02-25 DIAGNOSIS — Z8619 Personal history of other infectious and parasitic diseases: Secondary | ICD-10-CM | POA: Diagnosis not present

## 2014-02-25 DIAGNOSIS — Z79899 Other long term (current) drug therapy: Secondary | ICD-10-CM | POA: Diagnosis not present

## 2014-02-25 DIAGNOSIS — I10 Essential (primary) hypertension: Secondary | ICD-10-CM | POA: Insufficient documentation

## 2014-02-25 DIAGNOSIS — E785 Hyperlipidemia, unspecified: Secondary | ICD-10-CM | POA: Insufficient documentation

## 2014-02-25 DIAGNOSIS — R109 Unspecified abdominal pain: Secondary | ICD-10-CM

## 2014-02-25 NOTE — ED Notes (Signed)
Patient state that she is having frequent BM's. Patient is having cramping feeling in her stomach x 3 days. Also reports that she is having nausea.

## 2014-02-26 ENCOUNTER — Emergency Department (HOSPITAL_BASED_OUTPATIENT_CLINIC_OR_DEPARTMENT_OTHER): Payer: BC Managed Care – PPO

## 2014-02-26 LAB — CBC WITH DIFFERENTIAL/PLATELET
BASOS ABS: 0.1 10*3/uL (ref 0.0–0.1)
Basophils Relative: 1 % (ref 0–1)
EOS ABS: 0.1 10*3/uL (ref 0.0–0.7)
EOS PCT: 2 % (ref 0–5)
HEMATOCRIT: 39.3 % (ref 36.0–46.0)
Hemoglobin: 13.4 g/dL (ref 12.0–15.0)
LYMPHS PCT: 35 % (ref 12–46)
Lymphs Abs: 2.1 10*3/uL (ref 0.7–4.0)
MCH: 29.6 pg (ref 26.0–34.0)
MCHC: 34.1 g/dL (ref 30.0–36.0)
MCV: 86.9 fL (ref 78.0–100.0)
MONO ABS: 0.4 10*3/uL (ref 0.1–1.0)
Monocytes Relative: 6 % (ref 3–12)
Neutro Abs: 3.2 10*3/uL (ref 1.7–7.7)
Neutrophils Relative %: 56 % (ref 43–77)
PLATELETS: 224 10*3/uL (ref 150–400)
RBC: 4.52 MIL/uL (ref 3.87–5.11)
RDW: 12.1 % (ref 11.5–15.5)
WBC: 5.9 10*3/uL (ref 4.0–10.5)

## 2014-02-26 LAB — COMPREHENSIVE METABOLIC PANEL
ALBUMIN: 3.9 g/dL (ref 3.5–5.2)
ALK PHOS: 112 U/L (ref 39–117)
ALT: 125 U/L — ABNORMAL HIGH (ref 0–35)
AST: 95 U/L — AB (ref 0–37)
Anion gap: 14 (ref 5–15)
BUN: 9 mg/dL (ref 6–23)
CHLORIDE: 101 meq/L (ref 96–112)
CO2: 22 meq/L (ref 19–32)
CREATININE: 0.6 mg/dL (ref 0.50–1.10)
Calcium: 9.5 mg/dL (ref 8.4–10.5)
GFR calc Af Amer: >90 mL/min (ref >90)
Glucose, Bld: 270 mg/dL — ABNORMAL HIGH (ref 70–99)
POTASSIUM: 4.2 meq/L (ref 3.7–5.3)
SODIUM: 137 meq/L (ref 137–147)
Total Bilirubin: 0.4 mg/dL (ref 0.3–1.2)
Total Protein: 7.9 g/dL (ref 6.0–8.3)

## 2014-02-26 LAB — URINALYSIS, ROUTINE W REFLEX MICROSCOPIC
Bilirubin Urine: NEGATIVE
Hgb urine dipstick: NEGATIVE
KETONES UR: 15 mg/dL — AB
LEUKOCYTES UA: NEGATIVE
NITRITE: NEGATIVE
PH: 5 (ref 5.0–8.0)
Protein, ur: NEGATIVE mg/dL
Specific Gravity, Urine: 1.041 — ABNORMAL HIGH (ref 1.005–1.030)
Urobilinogen, UA: 0.2 mg/dL (ref 0.0–1.0)

## 2014-02-26 LAB — PREGNANCY, URINE: Preg Test, Ur: NEGATIVE

## 2014-02-26 LAB — URINE MICROSCOPIC-ADD ON

## 2014-02-26 LAB — LIPASE, BLOOD: Lipase: 34 U/L (ref 11–59)

## 2014-02-26 MED ORDER — SODIUM CHLORIDE 0.9 % IV SOLN
INTRAVENOUS | Status: DC
  Administered 2014-02-26: 01:00:00 via INTRAVENOUS

## 2014-02-26 MED ORDER — IOHEXOL 300 MG/ML  SOLN
50.0000 mL | Freq: Once | INTRAMUSCULAR | Status: AC | PRN
  Administered 2014-02-26: 50 mL via ORAL

## 2014-02-26 MED ORDER — FENTANYL CITRATE 0.05 MG/ML IJ SOLN
100.0000 ug | Freq: Once | INTRAMUSCULAR | Status: AC
  Administered 2014-02-26: 100 ug via INTRAVENOUS
  Filled 2014-02-26: qty 2

## 2014-02-26 MED ORDER — IOHEXOL 300 MG/ML  SOLN
100.0000 mL | Freq: Once | INTRAMUSCULAR | Status: AC | PRN
  Administered 2014-02-26: 100 mL via INTRAVENOUS

## 2014-02-26 MED ORDER — ONDANSETRON HCL 4 MG/2ML IJ SOLN
4.0000 mg | Freq: Once | INTRAMUSCULAR | Status: AC
  Administered 2014-02-26: 4 mg via INTRAVENOUS
  Filled 2014-02-26: qty 2

## 2014-02-26 MED ORDER — ONDANSETRON 8 MG PO TBDP: 8.0000 mg | ORAL_TABLET | Freq: Three times a day (TID) | ORAL | Status: DC | PRN

## 2014-02-26 NOTE — ED Provider Notes (Signed)
CSN: [id number]     Arrival date & time 02/25/14  2044 History   First MD Initiated Contact with Patient 02/26/14 0005     Chief Complaint  Patient presents with  . Abdominal Pain     (Consider location/radiation/quality/duration/timing/severity/associated sxs/prior Treatment) HPI  This is a 43 year old female with a 2 day history of abdominal pain. The pain is located at the left lower quadrant and radiates to the left upper quadrant. The pain is crampy in nature and occurs about every 20 minutes, lasting several minutes at a time. The pain is accompanied by the need to avoid her bowels. Her bowel movements have been loose but not liquid. She denies blood in her stools. She has had nausea but no vomiting. She has not had a fever. She denies vaginal bleeding, vaginal discharge or dysuria. She states her pain is a 10 out of 10. Pain is worse with movement or palpation.  Past Medical History  Diagnosis Date  . Hypertension   . Hyperlipidemia   . PCOS (polycystic ovarian syndrome)   . Allergic rhinitis   . Obesity   . Hx of varicella   . Hypopotassemia   . Diabetes mellitus     insulin  . Sleep apnea   . Difficult intubation     pt states with breast reduction surgery MD's had difficulty  . Postpartum care following vaginal delivery 11/13/2012  . Postpartum care following vaginal delivery (8/15) 11/13/2012   Past Surgical History  Procedure Laterality Date  . Breast reduction surgery  07-2003  . Invitro fertilization  11/16/2010    [facility]   Family History  Problem Relation Age of Onset  . Arthritis Mother   . Diabetes Mother   . Hypertension Mother   . Heart disease Mother 85    MI  . Diabetes Brother   . Hypertension Brother   . Arthritis Maternal Grandmother   . Diabetes Maternal Grandmother   . Hypertension Maternal Grandmother   . Cancer Maternal Grandmother   . Stroke Maternal Grandfather   . Cancer Maternal Grandfather   . Asthma Sister   . Allergies Sister     History  Substance Use Topics  . Smoking status: Never Smoker   . Smokeless tobacco: Never Used  . Alcohol Use: No   OB History    Gravida Para Term Preterm AB TAB SAB Ectopic Multiple Living   3 1 1  0 2 0 2 0 0 1     Review of Systems  All other systems reviewed and are negative.  Allergies  Review of patient's allergies indicates no known allergies.  Home Medications   Prior to Admission medications   Medication Sig Start Date End Date Taking? Authorizing Provider  amoxicillin-clavulanate (AUGMENTIN) 875-125 MG per tablet Take 1 tablet by mouth 2 (two) times daily. 12/26/13   Sherren Mocha, MD  Azelastine-Fluticasone [phone] MCG/ACT SUSP Place 1 spray into the nose 2 (two) times daily as needed (congestion). 12/31/12   Lelon Perla, DO  cetirizine (ZYRTEC) 10 MG tablet TAKE 1 TABLET BY MOUTH DAILY 08/02/13   Lelon Perla, DO  chlorpheniramine-HYDROcodone (TUSSIONEX PENNKINETIC ER) 10-8 MG/5ML LQCR Take 5 mLs by mouth every 12 (twelve) hours as needed. 12/26/13   Sherren Mocha, MD  colesevelam [facility]) 625 MG tablet Take 3 tablets (1,875 mg total) by mouth 2 (two) times daily with a meal. 06/15/13   Grayling Congress Lowne, DO  Guaifenesin (MUCINEX MAXIMUM STRENGTH) 1200 MG TB12 Take 1 tablet (1,200  mg total) by mouth every 12 (twelve) hours as needed. 12/26/13   Sherren MochaEva N Shaw, MD  metFORMIN (GLUCOPHAGE) 500 MG tablet Take 500 mg by mouth daily.    Historical Provider, MD  NIFEdipine (NIFEDICAL XL) 60 MG 24 hr tablet 1 tab by mouth daily--office visit due now 01/21/14   Lelon PerlaYvonne R Lowne, DO  NORA-BE 0.35 MG tablet  06/02/13   Historical Provider, MD  prenatal vitamin w/FE, FA (PRENATAL 1 + 1) 27-1 MG TABS tablet  06/09/13   Historical Provider, MD   BP 121/83 mmHg  Pulse 65  Temp(Src) 98.3 F (36.8 C) (Oral)  Resp 20  Ht 5\' 8"  (1.727 m)  Wt 241 lb (109.317 kg)  BMI 36.65 kg/m2  SpO2 97%  LMP 02/11/2014   Physical Exam  General: Well-developed, obese female in no acute distress or apparent  discomfort; appearance consistent with age of record HENT: normocephalic; atraumatic Eyes: pupils equal, round and reactive to light; extraocular muscles intact Neck: supple Heart: regular rate and rhythm Lungs: clear to auscultation bilaterally Abdomen: soft; nondistended; left sided abdominal tenderness, left upper quadrant greater than left lower quadrant; no masses or hepatosplenomegaly; bowel sounds present Extremities: No deformity; full range of motion; pulses normal Neurologic: Awake, alert and oriented; motor function intact in all extremities and symmetric; no facial droop Skin: Warm and dry Psychiatric: Normal mood and affect    ED Course  Procedures (including critical care time)   MDM   Nursing notes and vitals signs, including pulse oximetry, reviewed.  Summary of this visit's results, reviewed by myself:  Labs:  Results for orders placed or performed during the hospital encounter of 02/25/14 (from the past 24 hour(s))  Pregnancy, urine     Status: None   Collection Time: 02/26/14 12:35 AM  Result Value Ref Range   Preg Test, Ur NEGATIVE NEGATIVE  Urinalysis, Routine w reflex microscopic     Status: Abnormal   Collection Time: 02/26/14 12:35 AM  Result Value Ref Range   Color, Urine YELLOW YELLOW   APPearance CLEAR CLEAR   Specific Gravity, Urine 1.041 (H) 1.005 - 1.030   pH 5.0 5.0 - 8.0   Glucose, UA >1000 (A) NEGATIVE mg/dL   Hgb urine dipstick NEGATIVE NEGATIVE   Bilirubin Urine NEGATIVE NEGATIVE   Ketones, ur 15 (A) NEGATIVE mg/dL   Protein, ur NEGATIVE NEGATIVE mg/dL   Urobilinogen, UA 0.2 0.0 - 1.0 mg/dL   Nitrite NEGATIVE NEGATIVE   Leukocytes, UA NEGATIVE NEGATIVE  Comprehensive metabolic panel     Status: Abnormal   Collection Time: 02/26/14 12:35 AM  Result Value Ref Range   Sodium 137 137 - 147 mEq/L   Potassium 4.2 3.7 - 5.3 mEq/L   Chloride 101 96 - 112 mEq/L   CO2 22 19 - 32 mEq/L   Glucose, Bld 270 (H) 70 - 99 mg/dL   BUN 9 6 - 23  mg/dL   Creatinine, Ser 1.610.60 0.50 - 1.10 mg/dL   Calcium 9.5 8.4 - 09.610.5 mg/dL   Total Protein 7.9 6.0 - 8.3 g/dL   Albumin 3.9 3.5 - 5.2 g/dL   AST 95 (H) 0 - 37 U/L   ALT 125 (H) 0 - 35 U/L   Alkaline Phosphatase 112 39 - 117 U/L   Total Bilirubin 0.4 0.3 - 1.2 mg/dL   GFR calc non Af Amer >90 >90 mL/min   GFR calc Af Amer >90 >90 mL/min   Anion gap 14 5 - 15  CBC with Differential  Status: None   Collection Time: 02/26/14 12:35 AM  Result Value Ref Range   WBC 5.9 4.0 - 10.5 K/uL   RBC 4.52 3.87 - 5.11 MIL/uL   Hemoglobin 13.4 12.0 - 15.0 g/dL   HCT 09.839.3 11.936.0 - 14.746.0 %   MCV 86.9 78.0 - 100.0 fL   MCH 29.6 26.0 - 34.0 pg   MCHC 34.1 30.0 - 36.0 g/dL   RDW 82.912.1 56.211.5 - 13.015.5 %   Platelets 224 150 - 400 K/uL   Neutrophils Relative % 56 43 - 77 %   Neutro Abs 3.2 1.7 - 7.7 K/uL   Lymphocytes Relative 35 12 - 46 %   Lymphs Abs 2.1 0.7 - 4.0 K/uL   Monocytes Relative 6 3 - 12 %   Monocytes Absolute 0.4 0.1 - 1.0 K/uL   Eosinophils Relative 2 0 - 5 %   Eosinophils Absolute 0.1 0.0 - 0.7 K/uL   Basophils Relative 1 0 - 1 %   Basophils Absolute 0.1 0.0 - 0.1 K/uL  Lipase, blood     Status: None   Collection Time: 02/26/14 12:35 AM  Result Value Ref Range   Lipase 34 11 - 59 U/L  Urine microscopic-add on     Status: Abnormal   Collection Time: 02/26/14 12:35 AM  Result Value Ref Range   Squamous Epithelial / LPF MANY (A) RARE   WBC, UA 0-2 <3 WBC/hpf   RBC / HPF 0-2 <3 RBC/hpf   Bacteria, UA FEW (A) RARE    Imaging Studies: Ct Abdomen Pelvis W Contrast  02/26/2014   CLINICAL DATA:  Acute onset of stomach cramping and nausea.  EXAM: CT ABDOMEN AND PELVIS WITH CONTRAST  TECHNIQUE: Multidetector CT imaging of the abdomen and pelvis was performed using the standard protocol following bolus administration of intravenous contrast.  CONTRAST:  50mL OMNIPAQUE IOHEXOL 300 MG/ML SOLN, 100mL OMNIPAQUE IOHEXOL 300 MG/ML SOLN  COMPARISON:  Pelvic ultrasound performed 05/17/2012, and  abdominal ultrasound performed 01/07/2012  FINDINGS: Minimal bibasilar atelectasis is noted.  The liver and spleen are unremarkable in appearance. The gallbladder is within normal limits. The pancreas and adrenal glands are unremarkable.  The kidneys are unremarkable in appearance. There is no evidence of hydronephrosis. No renal or ureteral stones are seen. No perinephric stranding is appreciated.  The small bowel is unremarkable in appearance. The stomach is filled with contrast and is within normal limits. No acute vascular abnormalities are seen.  The appendix is diminutive and grossly unremarkable in appearance. There is no evidence for appendicitis. The colon is unremarkable in appearance.  The bladder is mildly distended and grossly unremarkable. The uterus is within normal limits. The ovaries are relatively symmetric. Trace free fluid within the pelvis is likely physiologic in nature. No inguinal lymphadenopathy is seen.  No acute osseous abnormalities are identified.  IMPRESSION: No acute abnormality seen within the abdomen or pelvis.   Electronically Signed   By: Roanna RaiderJeffery  Chang M.D.   On: 02/26/2014 02:10   2:33 AM Patient denies pain at this time. She was advised of her lab and CT findings.   Hanley SeamenJohn L Brionne Mertz, MD 02/26/14 [phone]

## 2014-02-26 NOTE — Discharge Instructions (Signed)

## 2014-02-28 ENCOUNTER — Ambulatory Visit (INDEPENDENT_AMBULATORY_CARE_PROVIDER_SITE_OTHER): Payer: BC Managed Care – PPO | Admitting: Family Medicine

## 2014-02-28 ENCOUNTER — Encounter: Payer: Self-pay | Admitting: Family Medicine

## 2014-02-28 ENCOUNTER — Telehealth: Payer: Self-pay | Admitting: Family Medicine

## 2014-02-28 VITALS — BP 120/82 | HR 73 | Temp 98.5°F | Wt 237.8 lb

## 2014-02-28 DIAGNOSIS — R8290 Unspecified abnormal findings in urine: Secondary | ICD-10-CM

## 2014-02-28 DIAGNOSIS — K589 Irritable bowel syndrome without diarrhea: Secondary | ICD-10-CM

## 2014-02-28 DIAGNOSIS — Z23 Encounter for immunization: Secondary | ICD-10-CM

## 2014-02-28 DIAGNOSIS — R748 Abnormal levels of other serum enzymes: Secondary | ICD-10-CM

## 2014-02-28 DIAGNOSIS — R1010 Upper abdominal pain, unspecified: Secondary | ICD-10-CM

## 2014-02-28 LAB — BASIC METABOLIC PANEL
BUN: 6 mg/dL (ref 6–23)
CHLORIDE: 100 meq/L (ref 96–112)
CO2: 24 meq/L (ref 19–32)
Calcium: 8.9 mg/dL (ref 8.4–10.5)
Creatinine, Ser: 0.8 mg/dL (ref 0.4–1.2)
GFR: 107.76 mL/min (ref >60.00)
Glucose, Bld: 166 mg/dL — ABNORMAL HIGH (ref 70–99)
POTASSIUM: 3.2 meq/L — AB (ref 3.5–5.1)
Sodium: 132 mEq/L — ABNORMAL LOW (ref 135–145)

## 2014-02-28 LAB — POCT URINALYSIS DIPSTICK
BILIRUBIN UA: NEGATIVE
Blood, UA: NEGATIVE
Glucose, UA: NEGATIVE
KETONES UA: 0.5
Leukocytes, UA: NEGATIVE
Nitrite, UA: NEGATIVE
Urobilinogen, UA: 0.2
pH, UA: 5.5

## 2014-02-28 LAB — CBC WITH DIFFERENTIAL/PLATELET
BASOS PCT: 0.3 % (ref 0.0–3.0)
Basophils Absolute: 0 10*3/uL (ref 0.0–0.1)
EOS PCT: 2.1 % (ref 0.0–5.0)
Eosinophils Absolute: 0.1 10*3/uL (ref 0.0–0.7)
HEMATOCRIT: 43.7 % (ref 36.0–46.0)
HEMOGLOBIN: 14.4 g/dL (ref 12.0–15.0)
LYMPHS ABS: 2.4 10*3/uL (ref 0.7–4.0)
Lymphocytes Relative: 41.4 % (ref 12.0–46.0)
MCHC: 32.9 g/dL (ref 30.0–36.0)
MCV: 88.6 fl (ref 78.0–100.0)
MONOS PCT: 5.9 % (ref 3.0–12.0)
Monocytes Absolute: 0.3 10*3/uL (ref 0.1–1.0)
NEUTROS ABS: 3 10*3/uL (ref 1.4–7.7)
Neutrophils Relative %: 50.3 % (ref 43.0–77.0)
Platelets: 266 10*3/uL (ref 150.0–400.0)
RBC: 4.93 Mil/uL (ref 3.87–5.11)
RDW: 12.8 % (ref 11.5–15.5)
WBC: 5.9 10*3/uL (ref 4.0–10.5)

## 2014-02-28 LAB — HEPATIC FUNCTION PANEL
ALBUMIN: 4.2 g/dL (ref 3.5–5.2)
ALT: 102 U/L — ABNORMAL HIGH (ref 0–35)
AST: 66 U/L — AB (ref 0–37)
Alkaline Phosphatase: 93 U/L (ref 39–117)
BILIRUBIN TOTAL: 0.7 mg/dL (ref 0.2–1.2)
Bilirubin, Direct: 0 mg/dL (ref 0.0–0.3)
Total Protein: 7.7 g/dL (ref 6.0–8.3)

## 2014-02-28 MED ORDER — HYOSCYAMINE SULFATE ER 0.375 MG PO TB12: 0.3750 mg | ORAL_TABLET | Freq: Two times a day (BID) | ORAL | Status: DC

## 2014-02-28 NOTE — Addendum Note (Signed)
Addended by: Tylene FantasiaLANGSTON, Osiel Stick S on: 02/28/2014 12:46 PM   Modules accepted: Orders

## 2014-02-28 NOTE — Progress Notes (Signed)
Subjective:     Kathleen Jordan is a 43 y.o. female who presents for evaluation of abdominal pain. Onset was 6 days ago. Symptoms have been unchanged. The pain is described as colicky, and is 10/10 in intensity. Pain is located in the entire abd--changes  with radiation to back and chest.  Aggravating factors: eating and drinking.  Alleviating factors: bowel movements. Associated symptoms: diarrhea and nausea. The patient denies belching, chills, constipation, fever, flatus, melena and vomiting.  The patient's history has been marked as reviewed and updated as appropriate. Past Medical History  Diagnosis Date  . Hypertension   . Hyperlipidemia   . PCOS (polycystic ovarian syndrome)   . Allergic rhinitis   . Obesity   . Hx of varicella   . Hypopotassemia   . Diabetes mellitus     insulin  . Sleep apnea   . Difficult intubation     pt states with breast reduction surgery MD's had difficulty  . Postpartum care following vaginal delivery 11/13/2012  . Postpartum care following vaginal delivery (8/15) 11/13/2012   Patient Active Problem List   Diagnosis Date Noted  . Obesity (BMI 30-39.9) 06/15/2013  . Hypertension complicating pregnancy 11/13/2012  . Postpartum care following vaginal delivery (8/15) 11/13/2012  . OSA (obstructive sleep apnea) 12/06/2010  . Diabetes mellitus 11/23/2010  . HEADACHE 12/08/2009  . POLYCYSTIC OVARIES 08/02/2009  . MORBID OBESITY 06/29/2009  . GOITER, SIMPLE 01/10/2009  . DYSPHAGIA UNSPECIFIED 01/10/2009  . ABSCESS, SKIN 11/10/2007  . HYPERLIPIDEMIA 05/21/2007  . SINUSITIS- ACUTE-NOS 05/21/2007  . HYPERTENSION 09/18/2006   Past Surgical History  Procedure Laterality Date  . Breast reduction surgery  07-2003  . [facility]  [facility]   Family History  Problem Relation Age of Onset  . Arthritis Mother   . Diabetes Mother   . Hypertension Mother   . Heart disease Mother [id number]    MI  . Diabetes Brother   .  Hypertension Brother   . Arthritis Maternal Grandmother   . Diabetes Maternal Grandmother   . Hypertension Maternal Grandmother   . Cancer Maternal Grandmother   . Stroke Maternal Grandfather   . Cancer Maternal Grandfather   . Asthma Sister   . Allergies Sister    History   Social History  . Marital Status: Married    Spouse Name: [name]    Number of Children: 0  . 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 Smoking status: Never Smoker   . Smokeless tobacco: Never Used  . Alcohol Use: No  . Drug Use: No  . Sexual Activity:    Partners: Male   Other Topics Concern  . None   Social History Narrative   Current Outpatient Prescriptions  Medication Sig Dispense Refill  . Azelastine-Fluticasone 137-50 MCG/ACT SUSP Place 1 spray into the nose 2 (two) times daily as needed (congestion).    . cetirizine (ZYRTEC) 10 MG tablet TAKE 1 TABLET BY MOUTH DAILY 30 tablet 11  . colesevelam (WELCHOL) 625 MG tablet Take 3 tablets (1,875 mg total) by mouth 2 (two) times daily with a meal. 180 tablet 5  . insulin lispro (HUMALOG KWIKPEN) 100 UNIT/ML KiwkPen Inject 10 Units into the skin as needed. SLIDING SCALE    . Insulin NPH, Human,, Isophane, (HUMULIN N PEN) 100 UNIT/ML Kiwkpen 25 UNITS IN THE MORNING AND 35 UNITS IN THE  EVENING    . NIFEdipine (NIFEDICAL XL) 60 MG 24 hr tablet 1 tab by mouth daily--office visit due now 90 tablet 0  . NORA-BE 0.35 MG tablet     . ondansetron (ZOFRAN ODT) 8 MG disintegrating tablet Take 1 tablet (8 mg total) by mouth every 8 (eight) hours as needed for nausea or vomiting. 10 tablet 0  . prenatal vitamin w/FE, FA (PRENATAL 1 + 1) 27-1 MG TABS tablet      No current facility-administered medications for this visit.   Allergies: Review of patient's allergies indicates no known allergies.  Review of Systems Pertinent items are noted in HPI.     Objective:    BP  120/82 mmHg  Pulse 73  Temp(Src) 98.5 F (36.9 C) (Oral)  Wt 237 lb 12.8 oz (107.865 kg)  SpO2 98%  LMP 02/11/2014 General appearance: alert, cooperative, appears stated age and no distress Abdomen: soft, non-tender; bowel sounds normal; no masses,  no organomegaly Extremities: extremities normal, atraumatic, no cyanosis or edema      Sensory exam of the foot is normal, tested with the monofilament. Good pulses, no lesions or ulcers, good peripheral pulses.     Assessment:    Abdominal pain, likely secondary to IBS? Marland Kitchen.    Plan:    Adhere to simple, bland diet. Further follow-up plans will be based on outcome of lab/imaging studies; see orders. Follow up as needed. refer to GI   rx levbid Recheck labs

## 2014-02-28 NOTE — Telephone Encounter (Signed)
Having abdominal pain since Wednesday, seen in the ER on Friday. Was told to follow up with Primary dr. Did not have a ER follow up slot until Wednesday with the PA. Patient states she can not wait till then, in a lot of pain. Please advise.

## 2014-02-28 NOTE — Telephone Encounter (Signed)
Appointment scheduled today (02/28/14) with Dr. Laury AxonLowne at 11:30 am.

## 2014-02-28 NOTE — Addendum Note (Signed)
Addended by: Eustace QuailEABOLD, Cypress Fanfan J on: 02/28/2014 05:08 PM   Modules accepted: Orders

## 2014-02-28 NOTE — Patient Instructions (Signed)
Diet and Irritable Bowel Syndrome  No cure has been found for irritable bowel syndrome (IBS). Many options are available to treat the symptoms. Your caregiver will give you the best treatments available for your symptoms. He or she will also encourage you to manage stress and to make changes to your diet. You need to work with your caregiver and Registered Dietician to find the best combination of medicine, diet, counseling, and support to control your symptoms. The following are some diet suggestions. FOODS THAT MAKE IBS WORSE  Fatty foods, such as French fries.  Milk products, such as cheese or ice cream.  Chocolate.  Alcohol.  Caffeine (found in coffee and some sodas).  Carbonated drinks, such as soda. If certain foods cause symptoms, you should eat less of them or stop eating them. FOOD JOURNAL   Keep a journal of the foods that seem to cause distress. Write down:  What you are eating during the day and when.  What problems you are having after eating.  When the symptoms occur in relation to your meals.  What foods always make you feel badly.  Take your notes with you to your caregiver to see if you should stop eating certain foods. FOODS THAT MAKE IBS BETTER Fiber reduces IBS symptoms, especially constipation, because it makes stools soft, bulky, and easier to pass. Fiber is found in bran, bread, cereal, beans, fruit, and vegetables. Examples of foods with fiber include:  Apples.  Peaches.  Pears.  Berries.  Figs.  Broccoli, raw.  Cabbage.  Carrots.  Raw peas.  Kidney beans.  Lima beans.  Whole-grain bread.  Whole-grain cereal. Add foods with fiber to your diet a little at a time. This will let your body get used to them. Too much fiber at once might cause gas and swelling of your abdomen. This can trigger symptoms in a person with IBS. Caregivers usually recommend a diet with enough fiber to produce soft, painless bowel movements. High fiber diets may  cause gas and bloating. However, these symptoms often go away within a few weeks, as your body adjusts. In many cases, dietary fiber may lessen IBS symptoms, particularly constipation. However, it may not help pain or diarrhea. High fiber diets keep the colon mildly enlarged (distended) with the added fiber. This may help prevent spasms in the colon. Some forms of fiber also keep water in the stool, thereby preventing hard stools that are difficult to pass.  Besides telling you to eat more foods with fiber, your caregiver may also tell you to get more fiber by taking a fiber pill or drinking water mixed with a special high fiber powder. An example of this is a natural fiber laxative containing psyllium seed.  TIPS  Large meals can cause cramping and diarrhea in people with IBS. If this happens to you, try eating 4 or 5 small meals a day, or try eating less at each of your usual 3 meals. It may also help if your meals are low in fat and high in carbohydrates. Examples of carbohydrates are pasta, rice, whole-grain breads and cereals, fruits, and vegetables.  If dairy products cause your symptoms to flare up, you can try eating less of those foods. You might be able to handle yogurt better than other dairy products, because it contains bacteria that helps with digestion. Dairy products are an important source of calcium and other nutrients. If you need to avoid dairy products, be sure to talk with a Registered Dietitian about getting these nutrients   through other food sources.  Drink enough water and fluids to keep your urine clear or pale yellow. This is important, especially if you have diarrhea. FOR MORE INFORMATION  International Foundation for Functional Gastrointestinal Disorders: www.iffgd.org  National Digestive Diseases Information Clearinghouse: digestive.niddk.nih.gov Document Released: 06/08/2003 Document Revised: 06/10/2011 Document Reviewed: 06/18/2013 ExitCare Patient Information 2015  [employer]. This information is not intended to replace advice given to you by your health care provider. Make sure you discuss any questions you have with your health care provider.  

## 2014-02-28 NOTE — Progress Notes (Signed)
Pre visit review using our clinic review tool, if applicable. No additional management support is needed unless otherwise documented below in the visit note. 

## 2014-03-01 ENCOUNTER — Other Ambulatory Visit: Payer: Self-pay

## 2014-03-01 DIAGNOSIS — E876 Hypokalemia: Secondary | ICD-10-CM

## 2014-03-01 LAB — HEPATITIS PANEL, ACUTE
HCV Ab: NEGATIVE
Hep A IgM: NONREACTIVE
Hep B C IgM: NONREACTIVE
Hepatitis B Surface Ag: NEGATIVE

## 2014-03-02 LAB — URINE CULTURE
COLONY COUNT: NO GROWTH
Organism ID, Bacteria: NO GROWTH

## 2014-03-15 ENCOUNTER — Other Ambulatory Visit: Payer: BC Managed Care – PPO

## 2014-04-07 ENCOUNTER — Other Ambulatory Visit: Payer: Self-pay | Admitting: Family Medicine

## 2014-04-07 ENCOUNTER — Telehealth: Payer: Self-pay | Admitting: Family Medicine

## 2014-04-07 NOTE — Telephone Encounter (Signed)
Caller name: Relation to pt: Call back number: Pharmacy:  Reason for call:  Pt requesting a refill hyoscyamine (LEVBID) 0.375 MG 12 hr tablet

## 2014-04-07 NOTE — Telephone Encounter (Signed)
It has been sent today.     KP

## 2014-04-14 ENCOUNTER — Other Ambulatory Visit: Payer: Self-pay | Admitting: Family Medicine

## 2014-05-09 ENCOUNTER — Other Ambulatory Visit: Payer: Self-pay

## 2014-05-09 MED ORDER — NIFEDIPINE ER OSMOTIC RELEASE 60 MG PO TB24: ORAL_TABLET | ORAL | Status: DC

## 2014-05-28 ENCOUNTER — Ambulatory Visit (INDEPENDENT_AMBULATORY_CARE_PROVIDER_SITE_OTHER): Payer: BLUE CROSS/BLUE SHIELD | Admitting: Family Medicine

## 2014-05-28 VITALS — BP 125/74 | HR 89 | Temp 98.8°F | Ht 68.5 in | Wt 243.2 lb

## 2014-05-28 DIAGNOSIS — J0100 Acute maxillary sinusitis, unspecified: Secondary | ICD-10-CM

## 2014-05-28 DIAGNOSIS — R059 Cough, unspecified: Secondary | ICD-10-CM

## 2014-05-28 DIAGNOSIS — J029 Acute pharyngitis, unspecified: Secondary | ICD-10-CM

## 2014-05-28 DIAGNOSIS — R05 Cough: Secondary | ICD-10-CM

## 2014-05-28 MED ORDER — AMOXICILLIN 875 MG PO TABS: 875.0000 mg | ORAL_TABLET | Freq: Two times a day (BID) | ORAL | Status: DC

## 2014-05-28 MED ORDER — HYDROCODONE-HOMATROPINE 5-1.5 MG/5ML PO SYRP: 5.0000 mL | ORAL_SOLUTION | Freq: Three times a day (TID) | ORAL | Status: DC | PRN

## 2014-05-28 NOTE — Progress Notes (Signed)
° °  Subjective:    Patient ID: Kathleen Jordan, female    DOB: May 11, 1970, 44 y.o.   MRN: [id number] This chart was scribed for Elvina SidleKurt Lauenstein, MD by Littie Deedsichard Sun, Medical Scribe. This patient was seen in Room 5 and the patient's care was started at 10:28 AM.   HPI HPI Comments: Kathleen Jordan is a 44 y.o. female who presents to the [facility] complaining of gradual onset URI symptoms that started 1 week ago. Patient reports having sore throat, cough at night, postnasal drip and congestion. She denies fever. She also denies hx of asthma. NKDA.  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  Review of Systems  Constitutional: Negative for fever.  HENT: Positive for congestion, postnasal drip and sore throat.   Respiratory: Positive for cough.        Objective:   Physical Exam CONSTITUTIONAL: Well developed/well nourished HEAD: Normocephalic/atraumatic EYES: EOM/PERRL ENMT: Mucous membranes moist NECK: supple no meningeal signs SPINE: entire spine nontender CV: S1/S2 noted, no murmurs/rubs/gallops noted LUNGS: Lungs are clear to auscultation bilaterally, no apparent distress ABDOMEN: soft, nontender, no rebound or guarding GU: no cva tenderness NEURO: Pt is awake/alert, moves all extremitiesx4 EXTREMITIES: pulses normal, full ROM SKIN: warm, color normal PSYCH: no abnormalities of mood noted   Thick mucopurulent discharge bilaterally from nasal passages     Assessment & Plan:   This chart was scribed in my presence and reviewed by me personally.    ICD-9-CM ICD-10-CM   1. Subacute maxillary sinusitis 461.0 J01.00 HYDROcodone-homatropine (HYCODAN) 5-1.5 MG/5ML syrup     amoxicillin (AMOXIL) 875 MG tablet  2. Cough 786.2 R05   3. Sore throat 462 J02.9      Signed, Elvina SidleKurt Lauenstein, MD

## 2014-05-28 NOTE — Patient Instructions (Signed)

## 2014-06-01 ENCOUNTER — Ambulatory Visit (HOSPITAL_BASED_OUTPATIENT_CLINIC_OR_DEPARTMENT_OTHER)
Admission: RE | Admit: 2014-06-01 | Discharge: 2014-06-01 | Disposition: A | Discharge status: Home / Self Care | Payer: BLUE CROSS/BLUE SHIELD | Source: Ambulatory Visit | Attending: Primary Care | Admitting: Primary Care

## 2014-06-01 ENCOUNTER — Ambulatory Visit (INDEPENDENT_AMBULATORY_CARE_PROVIDER_SITE_OTHER): Payer: BLUE CROSS/BLUE SHIELD | Admitting: Primary Care

## 2014-06-01 ENCOUNTER — Encounter: Payer: Self-pay | Admitting: Primary Care

## 2014-06-01 VITALS — BP 119/78 | HR 86 | Temp 98.1°F | Resp 16 | Ht 68.0 in | Wt 245.6 lb

## 2014-06-01 DIAGNOSIS — M25561 Pain in right knee: Secondary | ICD-10-CM

## 2014-06-01 DIAGNOSIS — M25569 Pain in unspecified knee: Secondary | ICD-10-CM | POA: Insufficient documentation

## 2014-06-01 NOTE — Assessment & Plan Note (Signed)
Suspect possible ligamental tear.  Xrays obtained downstairs. Referral to sports medicine. Ibuprofen for pain and inflammation, knee brace for support.

## 2014-06-01 NOTE — Patient Instructions (Signed)
You will be contacted by regarding you referral to Sports Medicine for your knee pain. Obtain Xray downstairs prior to leaving. Start ibuprofen 600mg  three times daily as needed for knee pain. Continue to use knee brace. Call me if ibuprofen does not help with pain. I hope you feel better soon!

## 2014-06-01 NOTE — Progress Notes (Signed)
Subjective:    Patient ID: Kathleen Jordan, female    DOB: [birth year], 44 y.o.   MRN: [id number]  HPI  Ms. Fifita is a 44 year old female who presents today with a chief complaint of right knee pain that has been present for 2 weeks. 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 The pain is worse during flexion and better with extension,  walking is bothersome and bending downward is very painful. Pain is 8/10 at this time at rest. She has been taking tylenol, using bengay and a knee brace.  Denies recent injury. She is uncomfortable continuously, denies pain in lower back.  Review of Systems  Constitutional: Positive for activity change.  Respiratory: Negative for shortness of breath.   Cardiovascular: Negative for chest pain.  Musculoskeletal: Positive for joint swelling, arthralgias and gait problem. Negative for back pain.       Endorses mild limping due to pain.  Skin: Negative for color change and rash.       Swelling to right knee  Neurological: Negative for dizziness.   Past Medical History  Diagnosis Date  . Hypertension   . Hyperlipidemia   . PCOS (polycystic ovarian syndrome)   . Allergic rhinitis   . Obesity   . Hx of varicella   . Hypopotassemia   . Diabetes mellitus     insulin  . Sleep apnea   . Difficult intubation     pt states with breast reduction surgery MD's had difficulty  . Postpartum care following vaginal delivery 11/13/2012  . Postpartum care following vaginal delivery (8/15) 11/13/2012    History   Social History  . Marital Status: Married    Spouse Name: Caryn Bee  . Number of Children: 0  . 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 Smoking status: Never Smoker   . Smokeless tobacco: Never Used  . Alcohol Use: No  . Drug Use: No  .  Sexual Activity:    Partners: Male   Other Topics Concern  . Not on file   Social History Narrative    Past Surgical History  Procedure Laterality Date  . Breast reduction surgery  07-2003  . Invitro fertilization  11/16/2010    [facility]    Family History  Problem Relation Age of Onset  . Arthritis Mother   . Diabetes Mother   . Hypertension Mother   . Heart disease Mother 41    MI  . Diabetes Brother   . Hypertension Brother   . Arthritis Maternal Grandmother   . Diabetes Maternal Grandmother   . Hypertension Maternal Grandmother   . Cancer Maternal Grandmother   . Stroke Maternal Grandfather   . Cancer Maternal Grandfather   . Asthma Sister   . Allergies Sister     No Known Allergies  Current Outpatient Prescriptions on File Prior to Visit  Medication Sig Dispense Refill  . amoxicillin (AMOXIL) 875 MG tablet Take 1 tablet (875 mg total) by mouth 2 (two) times daily. 20 tablet 0  . Azelastine-Fluticasone 137-50 MCG/ACT SUSP Place 1 spray into the nose 2 (two) times daily as needed (congestion).    . cetirizine (ZYRTEC) 10 MG tablet TAKE 1 TABLET BY MOUTH DAILY 30 tablet 11  . colesevelam (WELCHOL) 625 MG  tablet Take 3 tablets (1,875 mg total) by mouth 2 (two) times daily with a meal. 180 tablet 5  . fluticasone (FLONASE) 50 MCG/ACT nasal spray SPRAY TWICE IN EACH NOSTRIL ONCE DAILY AS NEEDED 16 g 5  . HYDROcodone-homatropine (HYCODAN) 5-1.5 MG/5ML syrup Take 5 mLs by mouth every 8 (eight) hours as needed for cough. 120 mL 0  . hyoscyamine (LEVBID) 0.375 MG 12 hr tablet TAKE 1 TABLET BY MOUTH TWICE DAILY 60 tablet 5  . insulin lispro (HUMALOG KWIKPEN) 100 UNIT/ML KiwkPen Inject 10 Units into the skin as needed. SLIDING SCALE    . Insulin NPH, Human,, Isophane, (HUMULIN N PEN) 100 UNIT/ML Kiwkpen 25 UNITS IN THE MORNING AND 35 UNITS IN THE EVENING    . NIFEdipine (NIFEDICAL XL) 60 MG 24 hr tablet 1 tab by mouth daily 90 tablet 1  . NORA-BE 0.35 MG tablet     .  ondansetron (ZOFRAN ODT) 8 MG disintegrating tablet Take 1 tablet (8 mg total) by mouth every 8 (eight) hours as needed for nausea or vomiting. 10 tablet 0  . prenatal vitamin w/FE, FA (PRENATAL 1 + 1) 27-1 MG TABS tablet      No current facility-administered medications on file prior to visit.    BP 119/78 mmHg  Pulse 86  Temp(Src) 98.1 F (36.7 C) (Oral)  Resp 16  Ht 5\' 8"  (1.727 m)  Wt 245 lb 9.6 oz (111.403 kg)  BMI 37.35 kg/m2  SpO2 98%  LMP 05/24/2014       Objective:   Physical Exam  Constitutional: She is oriented to person, place, and time. She appears well-developed.  HENT:  Head: Normocephalic.  Cardiovascular: Normal rate and regular rhythm.   Pulmonary/Chest: Effort normal and breath sounds normal.  Musculoskeletal: She exhibits edema and tenderness.  Mild swelling to proximal and medial to patella on right knee. No obvious dislocation. Tender upon palpation and maneuver which was difficult to discern from a ligamental tear.   Neurological: She is alert and oriented to person, place, and time.  Skin: Skin is warm and dry. No rash noted.  Psychiatric: She has a normal mood and affect.          Assessment & Plan:

## 2014-06-01 NOTE — Progress Notes (Signed)
Pre visit review using our clinic review tool, if applicable. No additional management support is needed unless otherwise documented below in the visit note. 

## 2014-06-03 ENCOUNTER — Ambulatory Visit: Payer: BLUE CROSS/BLUE SHIELD | Admitting: Family Medicine

## 2014-06-07 ENCOUNTER — Telehealth: Payer: Self-pay | Admitting: *Deleted

## 2014-06-07 NOTE — Telephone Encounter (Signed)
-----   Message from Doreene NestKatherine K Clark, NP sent at 06/01/2014  4:49 PM EST ----- Please notify [name] that her xrays did not show any fracture or fluid accumulation in her right knee. She may have some joint degeneration around the knee.  Take the ibuprofen and keep appointment with Sports Medicine as discussed.  Thanks!

## 2014-06-07 NOTE — Telephone Encounter (Signed)
Left message on voicemail to check mychart message as I have sent below instructions to pt via mychart.

## 2014-06-20 ENCOUNTER — Ambulatory Visit (INDEPENDENT_AMBULATORY_CARE_PROVIDER_SITE_OTHER): Payer: BLUE CROSS/BLUE SHIELD | Admitting: Family Medicine

## 2014-06-20 ENCOUNTER — Encounter: Payer: Self-pay | Admitting: Family Medicine

## 2014-06-20 VITALS — BP 118/82 | HR 93 | Temp 97.9°F | Ht 68.11 in | Wt 241.8 lb

## 2014-06-20 DIAGNOSIS — Z111 Encounter for screening for respiratory tuberculosis: Secondary | ICD-10-CM | POA: Diagnosis not present

## 2014-06-20 DIAGNOSIS — IMO0002 Reserved for concepts with insufficient information to code with codable children: Secondary | ICD-10-CM

## 2014-06-20 DIAGNOSIS — I10 Essential (primary) hypertension: Secondary | ICD-10-CM | POA: Diagnosis not present

## 2014-06-20 DIAGNOSIS — M25561 Pain in right knee: Secondary | ICD-10-CM | POA: Diagnosis not present

## 2014-06-20 DIAGNOSIS — E1165 Type 2 diabetes mellitus with hyperglycemia: Secondary | ICD-10-CM | POA: Diagnosis not present

## 2014-06-20 DIAGNOSIS — E785 Hyperlipidemia, unspecified: Secondary | ICD-10-CM | POA: Diagnosis not present

## 2014-06-20 DIAGNOSIS — Z0000 Encounter for general adult medical examination without abnormal findings: Secondary | ICD-10-CM | POA: Diagnosis not present

## 2014-06-20 LAB — CBC WITH DIFFERENTIAL/PLATELET
Basophils Absolute: 0 10*3/uL (ref 0.0–0.1)
Basophils Relative: 0.3 % (ref 0.0–3.0)
EOS ABS: 0.1 10*3/uL (ref 0.0–0.7)
Eosinophils Relative: 1.6 % (ref 0.0–5.0)
HEMATOCRIT: 44.4 % (ref 36.0–46.0)
Hemoglobin: 14.9 g/dL (ref 12.0–15.0)
LYMPHS ABS: 2.4 10*3/uL (ref 0.7–4.0)
LYMPHS PCT: 30.5 % (ref 12.0–46.0)
MCHC: 33.6 g/dL (ref 30.0–36.0)
MCV: 87.6 fl (ref 78.0–100.0)
MONO ABS: 0.5 10*3/uL (ref 0.1–1.0)
Monocytes Relative: 5.7 % (ref 3.0–12.0)
NEUTROS ABS: 4.9 10*3/uL (ref 1.4–7.7)
Neutrophils Relative %: 61.9 % (ref 43.0–77.0)
Platelets: 279 10*3/uL (ref 150.0–400.0)
RBC: 5.07 Mil/uL (ref 3.87–5.11)
RDW: 13.4 % (ref 11.5–15.5)
WBC: 7.9 10*3/uL (ref 4.0–10.5)

## 2014-06-20 LAB — HEPATIC FUNCTION PANEL
ALK PHOS: 125 U/L — AB (ref 39–117)
ALT: 227 U/L — AB (ref 0–35)
AST: 204 U/L — ABNORMAL HIGH (ref 0–37)
Albumin: 4.4 g/dL (ref 3.5–5.2)
BILIRUBIN DIRECT: 0.1 mg/dL (ref 0.0–0.3)
TOTAL PROTEIN: 8.3 g/dL (ref 6.0–8.3)
Total Bilirubin: 0.5 mg/dL (ref 0.2–1.2)

## 2014-06-20 LAB — POCT URINALYSIS DIPSTICK
Bilirubin, UA: NEGATIVE
Blood, UA: NEGATIVE
Glucose, UA: NEGATIVE
LEUKOCYTES UA: NEGATIVE
Nitrite, UA: NEGATIVE
Protein, UA: NEGATIVE
Spec Grav, UA: 1.025
Urobilinogen, UA: 0.2
pH, UA: 6

## 2014-06-20 LAB — BASIC METABOLIC PANEL
BUN: 14 mg/dL (ref 6–23)
CO2: 25 mEq/L (ref 19–32)
Calcium: 10.1 mg/dL (ref 8.4–10.5)
Chloride: 98 mEq/L (ref 96–112)
Creatinine, Ser: 0.76 mg/dL (ref 0.40–1.20)
GFR: 107.6 mL/min (ref >60.00)
Glucose, Bld: 305 mg/dL — ABNORMAL HIGH (ref 70–99)
Potassium: 4 mEq/L (ref 3.5–5.1)
SODIUM: 130 meq/L — AB (ref 135–145)

## 2014-06-20 LAB — MICROALBUMIN / CREATININE URINE RATIO
CREATININE, U: 56.1 mg/dL
Microalb Creat Ratio: 5.2 mg/g (ref 0.0–30.0)
Microalb, Ur: 2.9 mg/dL — ABNORMAL HIGH (ref 0.0–1.9)

## 2014-06-20 LAB — LIPID PANEL
CHOLESTEROL: 202 mg/dL — AB (ref 0–200)
HDL: 49 mg/dL (ref >39.00)
LDL Cholesterol: 129 mg/dL — ABNORMAL HIGH (ref 0–99)
NonHDL: 153
TRIGLYCERIDES: 122 mg/dL (ref 0.0–149.0)
Total CHOL/HDL Ratio: 4
VLDL: 24.4 mg/dL (ref 0.0–40.0)

## 2014-06-20 LAB — TSH: TSH: 1.1 u[IU]/mL (ref 0.35–4.50)

## 2014-06-20 LAB — HEMOGLOBIN A1C: HEMOGLOBIN A1C: 11.5 % — AB (ref 4.6–6.5)

## 2014-06-20 MED ORDER — NIFEDIPINE ER OSMOTIC RELEASE 60 MG PO TB24: ORAL_TABLET | ORAL | Status: DC

## 2014-06-20 NOTE — Progress Notes (Signed)
Subjective:     Kathleen Jordan is a 44 y.o. female and is here for a comprehensive physical exam. The patient reports no problems.  History   Social History  . Marital Status: Married    Spouse Name: [name]  . Number of Children: 0  . Years of Education: N/A   Occupational History  . Psychotherapist     [employer]   Social History Main Topics  . Smoking status: Never Smoker   . Smokeless tobacco: Never Used  . Alcohol Use: No  . Drug Use: No  . Sexual Activity:    Partners: Male   Other Topics Concern  . Not on file   Social History Narrative   Health Maintenance  Topic Date Due  . HEMOGLOBIN A1C  08/28/2011  . URINE MICROALBUMIN  11/20/2011  . INFLUENZA VACCINE  10/31/2014  . FOOT EXAM  03/01/2015  . OPHTHALMOLOGY EXAM  03/01/2015  . PAP SMEAR  12/28/2016  . PNEUMOCOCCAL POLYSACCHARIDE VACCINE (2) 03/01/2019  . TETANUS/TDAP  11/03/2022  . HIV Screening  Completed    The following portions of the patient's history were reviewed and updated as appropriate:  She  has a past medical history of Hypertension; Hyperlipidemia; PCOS (polycystic ovarian syndrome); Allergic rhinitis; Obesity; varicella; Hypopotassemia; Diabetes mellitus; Sleep apnea; Difficult intubation; Postpartum care following vaginal delivery (11/13/2012); and Postpartum care following vaginal delivery (8/15) (11/13/2012). She  does not have any pertinent problems on file. She  has past surgical history that includes Breast reduction surgery (07-2003) and invitro fertilization (11/16/2010). Her family history includes Allergies in her sister; Arthritis in her maternal grandmother and mother; Asthma in her sister; Cancer in her maternal grandfather and maternal grandmother; Diabetes in her brother, maternal grandmother, and mother; Heart disease (age of onset: 5358) in her mother; Hypertension in her brother, maternal grandmother, and mother; Stroke in her maternal grandfather. She   reports that she has never smoked. She has never used smokeless tobacco. She reports that she does not drink alcohol or use illicit drugs. She has a current medication list which includes the following prescription(s): azelastine-fluticasone, b-d uf iii mini pen needles, cetirizine, colesevelam, fluticasone, hydrocodone-homatropine, hyoscyamine, insulin lispro, insulin nph (human) (isophane), nifedipine, nora-be, and prenatal vitamin w/fe, fa. Current Outpatient Prescriptions on File Prior to Visit  Medication Sig Dispense Refill  . Azelastine-Fluticasone 137-50 MCG/ACT SUSP Place 1 spray into the nose 2 (two) times daily as needed (congestion).    . B-D UF III MINI PEN NEEDLES 31G X 5 MM MISC   6  . cetirizine (ZYRTEC) 10 MG tablet TAKE 1 TABLET BY MOUTH DAILY 30 tablet 11  . colesevelam (WELCHOL) 625 MG tablet Take 3 tablets (1,875 mg total) by mouth 2 (two) times daily with a meal. 180 tablet 5  . fluticasone (FLONASE) 50 MCG/ACT nasal spray SPRAY TWICE IN EACH NOSTRIL ONCE DAILY AS NEEDED 16 g 5  . HYDROcodone-homatropine (HYCODAN) 5-1.5 MG/5ML syrup Take 5 mLs by mouth every 8 (eight) hours as needed for cough. 120 mL 0  . hyoscyamine (LEVBID) 0.375 MG 12 hr tablet TAKE 1 TABLET BY MOUTH TWICE DAILY 60 tablet 5  . insulin lispro (HUMALOG KWIKPEN) 100 UNIT/ML KiwkPen Inject 10 Units into the skin as needed. SLIDING SCALE    . Insulin NPH, Human,, Isophane, (HUMULIN N PEN) 100 UNIT/ML Kiwkpen 25 UNITS IN THE MORNING AND 35 UNITS IN THE EVENING    . NORA-BE 0.35 MG tablet     . prenatal vitamin  w/FE, FA (PRENATAL 1 + 1) 27-1 MG TABS tablet      No current facility-administered medications on file prior to visit.   She has No Known Allergies..  Review of Systems Review of Systems  Constitutional: Negative for activity change, appetite change and fatigue.  HENT: Negative for hearing loss, congestion, tinnitus and ear discharge.  dentist-- due Eyes: Negative for visual disturbance (see optho  q2y -- vision corrected to 20/20 with glasses).  Respiratory: Negative for cough, chest tightness and shortness of breath.   Cardiovascular: Negative for chest pain, palpitations and leg swelling.  Gastrointestinal: Negative for abdominal pain, diarrhea, constipation and abdominal distention.  Genitourinary: Negative for urgency, frequency, decreased urine volume and difficulty urinating.  Musculoskeletal: Negative for back pain, arthralgias and gait problem.  Skin: Negative for color change, pallor and rash.  Neurological: Negative for dizziness, light-headedness, numbness and headaches.  Hematological: Negative for adenopathy. Does not bruise/bleed easily.  Psychiatric/Behavioral: Negative for suicidal ideas, confusion, sleep disturbance, self-injury, dysphoric mood, decreased concentration and agitation.       Objective:    BP 118/82 mmHg  Pulse 93  Temp(Src) 97.9 F (36.6 C) (Oral)  Ht 5' 8.11" (1.73 m)  Wt 241 lb 12.8 oz (109.68 kg)  BMI 36.65 kg/m2  SpO2 97%  LMP 05/24/2014 General appearance: alert, cooperative, appears stated age and no distress Head: Normocephalic, without obvious abnormality, atraumatic Eyes: negative findings: lids and lashes normal, conjunctivae and sclerae normal and pupils equal, round, reactive to light and accomodation Ears: normal TM's and external ear canals both ears Nose: Nares normal. Septum midline. Mucosa normal. No drainage or sinus tenderness. Throat: lips, mucosa, and tongue normal; teeth and gums normal Neck: no adenopathy, no carotid bruit, no JVD, supple, symmetrical, trachea midline and thyroid not enlarged, symmetric, no tenderness/mass/nodules Back: symmetric, no curvature. ROM normal. No CVA tenderness. Lungs: clear to auscultation bilaterally Breasts: gyn--- dr Percell [place] office Heart: regular rate and rhythm, S1, S2 normal, no murmur, click, rub or gallop Abdomen: soft, non-tender; bowel sounds normal; no masses,  no  organomegaly Pelvic: deferred--gyn Extremities: extremities normal, atraumatic, no cyanosis or edema Pulses: 2+ and symmetric Skin: Skin color, texture, turgor normal. No rashes or lesions Lymph nodes: Cervical, supraclavicular, and axillary nodes normal. Neurologic: Alert and oriented X 3, normal strength and tone. Normal symmetric reflexes. Normal coordination and gait Psych- no depression,no anxiety      Assessment:    Healthy female exam.      Plan:     ghm utd Check labs See After Visit Summary for Counseling Recommendations    1. Preventative health care   - Basic metabolic panel - CBC with Differential/Platelet - Hemoglobin A1c - Hepatic function panel - Lipid panel - Microalbumin / creatinine urine ratio - POCT urinalysis dipstick - TSH  2. Right knee pain   - Ambulatory referral to Sports Medicine 3. DM II uncontrolled Check labs,  con't insulin f/u endo - Basic metabolic panel - Hemoglobin A1c  4. Essential hypertension stable - Basic metabolic panel  5. Hyperlipidemia Check labs, con't welchol - Hepatic function panel - Lipid panel  6. Screening-pulmonary TB  - PPD

## 2014-06-20 NOTE — Progress Notes (Signed)
Pre visit review using our clinic review tool, if applicable. No additional management support is needed unless otherwise documented below in the visit note. 

## 2014-06-20 NOTE — Patient Instructions (Signed)
Preventive Care for Adults A healthy lifestyle and preventive care can promote health and wellness. Preventive health guidelines for women include the following key practices.  A routine yearly physical is a good way to check with your health care provider about your health and preventive screening. It is a chance to share any concerns and updates on your health and to receive a thorough exam.  Visit your dentist for a routine exam and preventive care every 6 months. Brush your teeth twice a day and floss once a day. Good oral hygiene prevents tooth decay and gum disease.  The frequency of eye exams is based on your age, health, family medical history, use of contact lenses, and other factors. Follow your health care provider's recommendations for frequency of eye exams.  Eat a healthy diet. Foods like vegetables, fruits, whole grains, low-fat dairy products, and lean protein foods contain the nutrients you need without too many calories. Decrease your intake of foods high in solid fats, added sugars, and salt. Eat the right amount of calories for you.Get information about a proper diet from your health care provider, if necessary.  Regular physical exercise is one of the most important things you can do for your health. Most adults should get at least 150 minutes of moderate-intensity exercise (any activity that increases your heart rate and causes you to sweat) each week. In addition, most adults need muscle-strengthening exercises on 2 or more days a week.  Maintain a healthy weight. The body mass index (BMI) is a screening tool to identify possible weight problems. It provides an estimate of body fat based on height and weight. Your health care provider can find your BMI and can help you achieve or maintain a healthy weight.For adults 20 years and older:  A BMI below 18.5 is considered underweight.  A BMI of 18.5 to 24.9 is normal.  A BMI of 25 to 29.9 is considered overweight.  A BMI of  30 and above is considered obese.  Maintain normal blood lipids and cholesterol levels by exercising and minimizing your intake of saturated fat. Eat a balanced diet with plenty of fruit and vegetables. Blood tests for lipids and cholesterol should begin at age 76 and be repeated every 5 years. If your lipid or cholesterol levels are high, you are over 50, or you are at high risk for heart disease, you may need your cholesterol levels checked more frequently.Ongoing high lipid and cholesterol levels should be treated with medicines if diet and exercise are not working.  If you smoke, find out from your health care provider how to quit. If you do not use tobacco, do not start.  Lung cancer screening is recommended for adults aged 22-80 years who are at high risk for developing lung cancer because of a history of smoking. A yearly low-dose CT scan of the lungs is recommended for people who have at least a 30-pack-year history of smoking and are a current smoker or have quit within the past 15 years. A pack year of smoking is smoking an average of 1 pack of cigarettes a day for 1 year (for example: 1 pack a day for 30 years or 2 packs a day for 15 years). Yearly screening should continue until the smoker has stopped smoking for at least 15 years. Yearly screening should be stopped for people who develop a health problem that would prevent them from having lung cancer treatment.  If you are pregnant, do not drink alcohol. If you are breastfeeding,  be very cautious about drinking alcohol. If you are not pregnant and choose to drink alcohol, do not have more than 1 drink per day. One drink is considered to be 12 ounces (355 mL) of beer, 5 ounces (148 mL) of wine, or 1.5 ounces (44 mL) of liquor.  Avoid use of street drugs. Do not share needles with anyone. Ask for help if you need support or instructions about stopping the use of drugs.  High blood pressure causes heart disease and increases the risk of  stroke. Your blood pressure should be checked at least every 1 to 2 years. Ongoing high blood pressure should be treated with medicines if weight loss and exercise do not work.  If you are 75-52 years old, ask your health care provider if you should take aspirin to prevent strokes.  Diabetes screening involves taking a blood sample to check your fasting blood sugar level. This should be done once every 3 years, after age 15, if you are within normal weight and without risk factors for diabetes. Testing should be considered at a younger age or be carried out more frequently if you are overweight and have at least 1 risk factor for diabetes.  Breast cancer screening is essential preventive care for women. You should practice "breast self-awareness." This means understanding the normal appearance and feel of your breasts and may include breast self-examination. Any changes detected, no matter how small, should be reported to a health care provider. Women in their 58s and 30s should have a clinical breast exam (CBE) by a health care provider as part of a regular health exam every 1 to 3 years. After age 16, women should have a CBE every year. Starting at age 53, women should consider having a mammogram (breast X-ray test) every year. Women who have a family history of breast cancer should talk to their health care provider about genetic screening. Women at a high risk of breast cancer should talk to their health care providers about having an MRI and a mammogram every year.  Breast cancer gene (BRCA)-related cancer risk assessment is recommended for women who have family members with BRCA-related cancers. BRCA-related cancers include breast, ovarian, tubal, and peritoneal cancers. Having family members with these cancers may be associated with an increased risk for harmful changes (mutations) in the breast cancer genes BRCA1 and BRCA2. Results of the assessment will determine the need for genetic counseling and  BRCA1 and BRCA2 testing.  Routine pelvic exams to screen for cancer are no longer recommended for nonpregnant women who are considered low risk for cancer of the pelvic organs (ovaries, uterus, and vagina) and who do not have symptoms. Ask your health care provider if a screening pelvic exam is right for you.  If you have had past treatment for cervical cancer or a condition that could lead to cancer, you need Pap tests and screening for cancer for at least 20 years after your treatment. If Pap tests have been discontinued, your risk factors (such as having a new sexual partner) need to be reassessed to determine if screening should be resumed. Some women have medical problems that increase the chance of getting cervical cancer. In these cases, your health care provider may recommend more frequent screening and Pap tests.  The HPV test is an additional test that may be used for cervical cancer screening. The HPV test looks for the virus that can cause the cell changes on the cervix. The cells collected during the Pap test can be  tested for HPV. The HPV test could be used to screen women aged 30 years and older, and should be used in women of any age who have unclear Pap test results. After the age of 30, women should have HPV testing at the same frequency as a Pap test.  Colorectal cancer can be detected and often prevented. Most routine colorectal cancer screening begins at the age of 50 years and continues through age 75 years. However, your health care provider may recommend screening at an earlier age if you have risk factors for colon cancer. On a yearly basis, your health care provider may provide home test kits to check for hidden blood in the stool. Use of a small camera at the end of a tube, to directly examine the colon (sigmoidoscopy or colonoscopy), can detect the earliest forms of colorectal cancer. Talk to your health care provider about this at age 50, when routine screening begins. Direct  exam of the colon should be repeated every 5-10 years through age 75 years, unless early forms of pre-cancerous polyps or small growths are found.  People who are at an increased risk for hepatitis B should be screened for this virus. You are considered at high risk for hepatitis B if:  You were born in a country where hepatitis B occurs often. Talk with your health care provider about which countries are considered high risk.  Your parents were born in a high-risk country and you have not received a shot to protect against hepatitis B (hepatitis B vaccine).  You have HIV or AIDS.  You use needles to inject street drugs.  You live with, or have sex with, someone who has hepatitis B.  You get hemodialysis treatment.  You take certain medicines for conditions like cancer, organ transplantation, and autoimmune conditions.  Hepatitis C blood testing is recommended for all people born from 1945 through 1965 and any individual with known risks for hepatitis C.  Practice safe sex. Use condoms and avoid high-risk sexual practices to reduce the spread of sexually transmitted infections (STIs). STIs include gonorrhea, chlamydia, syphilis, trichomonas, herpes, HPV, and human immunodeficiency virus (HIV). Herpes, HIV, and HPV are viral illnesses that have no cure. They can result in disability, cancer, and death.  You should be screened for sexually transmitted illnesses (STIs) including gonorrhea and chlamydia if:  You are sexually active and are younger than 24 years.  You are older than 24 years and your health care provider tells you that you are at risk for this type of infection.  Your sexual activity has changed since you were last screened and you are at an increased risk for chlamydia or gonorrhea. Ask your health care provider if you are at risk.  If you are at risk of being infected with HIV, it is recommended that you take a prescription medicine daily to prevent HIV infection. This is  called preexposure prophylaxis (PrEP). You are considered at risk if:  You are a heterosexual woman, are sexually active, and are at increased risk for HIV infection.  You take drugs by injection.  You are sexually active with a partner who has HIV.  Talk with your health care provider about whether you are at high risk of being infected with HIV. If you choose to begin PrEP, you should first be tested for HIV. You should then be tested every 3 months for as long as you are taking PrEP.  Osteoporosis is a disease in which the bones lose minerals and strength   with aging. This can result in serious bone fractures or breaks. The risk of osteoporosis can be identified using a bone density scan. Women ages 65 years and over and women at risk for fractures or osteoporosis should discuss screening with their health care providers. Ask your health care provider whether you should take a calcium supplement or vitamin D to reduce the rate of osteoporosis.  Menopause can be associated with physical symptoms and risks. Hormone replacement therapy is available to decrease symptoms and risks. You should talk to your health care provider about whether hormone replacement therapy is right for you.  Use sunscreen. Apply sunscreen liberally and repeatedly throughout the day. You should seek shade when your shadow is shorter than you. Protect yourself by wearing long sleeves, pants, a wide-brimmed hat, and sunglasses year round, whenever you are outdoors.  Once a month, do a whole body skin exam, using a mirror to look at the skin on your back. Tell your health care provider of new moles, moles that have irregular borders, moles that are larger than a pencil eraser, or moles that have changed in shape or color.  Stay current with required vaccines (immunizations).  Influenza vaccine. All adults should be immunized every year.  Tetanus, diphtheria, and acellular pertussis (Td, Tdap) vaccine. Pregnant women should  receive 1 dose of Tdap vaccine during each pregnancy. The dose should be obtained regardless of the length of time since the last dose. Immunization is preferred during the 27th-36th week of gestation. An adult who has not previously received Tdap or who does not know her vaccine status should receive 1 dose of Tdap. This initial dose should be followed by tetanus and diphtheria toxoids (Td) booster doses every 10 years. Adults with an unknown or incomplete history of completing a 3-dose immunization series with Td-containing vaccines should begin or complete a primary immunization series including a Tdap dose. Adults should receive a Td booster every 10 years.  Varicella vaccine. An adult without evidence of immunity to varicella should receive 2 doses or a second dose if she has previously received 1 dose. Pregnant females who do not have evidence of immunity should receive the first dose after pregnancy. This first dose should be obtained before leaving the health care facility. The second dose should be obtained 4-8 weeks after the first dose.  Human papillomavirus (HPV) vaccine. Females aged 13-26 years who have not received the vaccine previously should obtain the 3-dose series. The vaccine is not recommended for use in pregnant females. However, pregnancy testing is not needed before receiving a dose. If a female is found to be pregnant after receiving a dose, no treatment is needed. In that case, the remaining doses should be delayed until after the pregnancy. Immunization is recommended for any person with an immunocompromised condition through the age of 26 years if she did not get any or all doses earlier. During the 3-dose series, the second dose should be obtained 4-8 weeks after the first dose. The third dose should be obtained 24 weeks after the first dose and 16 weeks after the second dose.  Zoster vaccine. One dose is recommended for adults aged 60 years or older unless certain conditions are  present.  Measles, mumps, and rubella (MMR) vaccine. Adults born before 1957 generally are considered immune to measles and mumps. Adults born in 1957 or later should have 1 or more doses of MMR vaccine unless there is a contraindication to the vaccine or there is laboratory evidence of immunity to   each of the three diseases. A routine second dose of MMR vaccine should be obtained at least 28 days after the first dose for students attending postsecondary schools, health care workers, or international travelers. People who received inactivated measles vaccine or an unknown type of measles vaccine during 1963-1967 should receive 2 doses of MMR vaccine. People who received inactivated mumps vaccine or an unknown type of mumps vaccine before 1979 and are at high risk for mumps infection should consider immunization with 2 doses of MMR vaccine. For females of childbearing age, rubella immunity should be determined. If there is no evidence of immunity, females who are not pregnant should be vaccinated. If there is no evidence of immunity, females who are pregnant should delay immunization until after pregnancy. Unvaccinated health care workers born before 1957 who lack laboratory evidence of measles, mumps, or rubella immunity or laboratory confirmation of disease should consider measles and mumps immunization with 2 doses of MMR vaccine or rubella immunization with 1 dose of MMR vaccine.  Pneumococcal 13-valent conjugate (PCV13) vaccine. When indicated, a person who is uncertain of her immunization history and has no record of immunization should receive the PCV13 vaccine. An adult aged 19 years or older who has certain medical conditions and has not been previously immunized should receive 1 dose of PCV13 vaccine. This PCV13 should be followed with a dose of pneumococcal polysaccharide (PPSV23) vaccine. The PPSV23 vaccine dose should be obtained at least 8 weeks after the dose of PCV13 vaccine. An adult aged 19  years or older who has certain medical conditions and previously received 1 or more doses of PPSV23 vaccine should receive 1 dose of PCV13. The PCV13 vaccine dose should be obtained 1 or more years after the last PPSV23 vaccine dose.  Pneumococcal polysaccharide (PPSV23) vaccine. When PCV13 is also indicated, PCV13 should be obtained first. All adults aged 65 years and older should be immunized. An adult younger than age 65 years who has certain medical conditions should be immunized. Any person who resides in a nursing home or long-term care facility should be immunized. An adult smoker should be immunized. People with an immunocompromised condition and certain other conditions should receive both PCV13 and PPSV23 vaccines. People with human immunodeficiency virus (HIV) infection should be immunized as soon as possible after diagnosis. Immunization during chemotherapy or radiation therapy should be avoided. Routine use of PPSV23 vaccine is not recommended for American Indians, [place] Natives, or people younger than 65 years unless there are medical conditions that require PPSV23 vaccine. When indicated, people who have unknown immunization and have no record of immunization should receive PPSV23 vaccine. One-time revaccination 5 years after the first dose of PPSV23 is recommended for people aged 19-64 years who have chronic kidney failure, nephrotic syndrome, asplenia, or immunocompromised conditions. People who received 1-2 doses of PPSV23 before age 65 years should receive another dose of PPSV23 vaccine at age 65 years or later if at least 5 years have passed since the previous dose. Doses of PPSV23 are not needed for people immunized with PPSV23 at or after age 65 years.  Meningococcal vaccine. Adults with asplenia or persistent complement component deficiencies should receive 2 doses of quadrivalent meningococcal conjugate (MenACWY-D) vaccine. The doses should be obtained at least 2 months apart.  Microbiologists working with certain meningococcal bacteria, military recruits, people at risk during an outbreak, and people who travel to or live in countries with a high rate of meningitis should be immunized. A first-year college student up through age   21 years who is living in a residence hall should receive a dose if she did not receive a dose on or after her 16th birthday. Adults who have certain high-risk conditions should receive one or more doses of vaccine.  Hepatitis A vaccine. Adults who wish to be protected from this disease, have certain high-risk conditions, work with hepatitis A-infected animals, work in hepatitis A research labs, or travel to or work in countries with a high rate of hepatitis A should be immunized. Adults who were previously unvaccinated and who anticipate close contact with an international adoptee during the first 60 days after arrival in the [place] from a country with a high rate of hepatitis A should be immunized.  Hepatitis B vaccine. Adults who wish to be protected from this disease, have certain high-risk conditions, may be exposed to blood or other infectious body fluids, are household contacts or sex partners of hepatitis B positive people, are clients or workers in certain care facilities, or travel to or work in countries with a high rate of hepatitis B should be immunized.  Haemophilus influenzae type b (Hib) vaccine. A previously unvaccinated person with asplenia or sickle cell disease or having a scheduled splenectomy should receive 1 dose of Hib vaccine. Regardless of previous immunization, a recipient of a hematopoietic stem cell transplant should receive a 3-dose series 6-12 months after her successful transplant. Hib vaccine is not recommended for adults with HIV infection. Preventive Services / Frequency Ages 64 to 68 years  Blood pressure check.** / Every 1 to 2 years.  Lipid and cholesterol check.** / Every 5 years beginning at age  22.  Clinical breast exam.** / Every 3 years for women in their 88s and 53s.  BRCA-related cancer risk assessment.** / For women who have family members with a BRCA-related cancer (breast, ovarian, tubal, or peritoneal cancers).  Pap test.** / Every 2 years from ages 90 through 51. Every 3 years starting at age 21 through age 56 or 3 with a history of 3 consecutive normal Pap tests.  HPV screening.** / Every 3 years from ages 24 through ages 1 to 46 with a history of 3 consecutive normal Pap tests.  Hepatitis C blood test.** / For any individual with known risks for hepatitis C.  Skin self-exam. / Monthly.  Influenza vaccine. / Every year.  Tetanus, diphtheria, and acellular pertussis (Tdap, Td) vaccine.** / Consult your health care provider. Pregnant women should receive 1 dose of Tdap vaccine during each pregnancy. 1 dose of Td every 10 years.  Varicella vaccine.** / Consult your health care provider. Pregnant females who do not have evidence of immunity should receive the first dose after pregnancy.  HPV vaccine. / 3 doses over 6 months, if 72 and younger. The vaccine is not recommended for use in pregnant females. However, pregnancy testing is not needed before receiving a dose.  Measles, mumps, rubella (MMR) vaccine.** / You need at least 1 dose of MMR if you were born in 1957 or later. You may also need a 2nd dose. For females of childbearing age, rubella immunity should be determined. If there is no evidence of immunity, females who are not pregnant should be vaccinated. If there is no evidence of immunity, females who are pregnant should delay immunization until after pregnancy.  Pneumococcal 13-valent conjugate (PCV13) vaccine.** / Consult your health care provider.  Pneumococcal polysaccharide (PPSV23) vaccine.** / 1 to 2 doses if you smoke cigarettes or if you have certain conditions.  Meningococcal vaccine.** /  1 dose if you are age 19 to 21 years and a first-year college  student living in a residence hall, or have one of several medical conditions, you need to get vaccinated against meningococcal disease. You may also need additional booster doses.  Hepatitis A vaccine.** / Consult your health care provider.  Hepatitis B vaccine.** / Consult your health care provider.  Haemophilus influenzae type b (Hib) vaccine.** / Consult your health care provider. Ages 40 to 64 years  Blood pressure check.** / Every 1 to 2 years.  Lipid and cholesterol check.** / Every 5 years beginning at age 20 years.  Lung cancer screening. / Every year if you are aged 55-80 years and have a 30-pack-year history of smoking and currently smoke or have quit within the past 15 years. Yearly screening is stopped once you have quit smoking for at least 15 years or develop a health problem that would prevent you from having lung cancer treatment.  Clinical breast exam.** / Every year after age 40 years.  BRCA-related cancer risk assessment.** / For women who have family members with a BRCA-related cancer (breast, ovarian, tubal, or peritoneal cancers).  Mammogram.** / Every year beginning at age 40 years and continuing for as long as you are in good health. Consult with your health care provider.  Pap test.** / Every 3 years starting at age 30 years through age 65 or 70 years with a history of 3 consecutive normal Pap tests.  HPV screening.** / Every 3 years from ages 30 years through ages 65 to 70 years with a history of 3 consecutive normal Pap tests.  Fecal occult blood test (FOBT) of stool. / Every year beginning at age 50 years and continuing until age 75 years. You may not need to do this test if you get a colonoscopy every 10 years.  Flexible sigmoidoscopy or colonoscopy.** / Every 5 years for a flexible sigmoidoscopy or every 10 years for a colonoscopy beginning at age 50 years and continuing until age 75 years.  Hepatitis C blood test.** / For all people born from 1945 through  1965 and any individual with known risks for hepatitis C.  Skin self-exam. / Monthly.  Influenza vaccine. / Every year.  Tetanus, diphtheria, and acellular pertussis (Tdap/Td) vaccine.** / Consult your health care provider. Pregnant women should receive 1 dose of Tdap vaccine during each pregnancy. 1 dose of Td every 10 years.  Varicella vaccine.** / Consult your health care provider. Pregnant females who do not have evidence of immunity should receive the first dose after pregnancy.  Zoster vaccine.** / 1 dose for adults aged 60 years or older.  Measles, mumps, rubella (MMR) vaccine.** / You need at least 1 dose of MMR if you were born in 1957 or later. You may also need a 2nd dose. For females of childbearing age, rubella immunity should be determined. If there is no evidence of immunity, females who are not pregnant should be vaccinated. If there is no evidence of immunity, females who are pregnant should delay immunization until after pregnancy.  Pneumococcal 13-valent conjugate (PCV13) vaccine.** / Consult your health care provider.  Pneumococcal polysaccharide (PPSV23) vaccine.** / 1 to 2 doses if you smoke cigarettes or if you have certain conditions.  Meningococcal vaccine.** / Consult your health care provider.  Hepatitis A vaccine.** / Consult your health care provider.  Hepatitis B vaccine.** / Consult your health care provider.  Haemophilus influenzae type b (Hib) vaccine.** / Consult your health care provider. Ages 65   years and over  Blood pressure check.** / Every 1 to 2 years.  Lipid and cholesterol check.** / Every 5 years beginning at age 22 years.  Lung cancer screening. / Every year if you are aged 73-80 years and have a 30-pack-year history of smoking and currently smoke or have quit within the past 15 years. Yearly screening is stopped once you have quit smoking for at least 15 years or develop a health problem that would prevent you from having lung cancer  treatment.  Clinical breast exam.** / Every year after age 4 years.  BRCA-related cancer risk assessment.** / For women who have family members with a BRCA-related cancer (breast, ovarian, tubal, or peritoneal cancers).  Mammogram.** / Every year beginning at age 40 years and continuing for as long as you are in good health. Consult with your health care provider.  Pap test.** / Every 3 years starting at age 9 years through age 34 or 91 years with 3 consecutive normal Pap tests. Testing can be stopped between 65 and 70 years with 3 consecutive normal Pap tests and no abnormal Pap or HPV tests in the past 10 years.  HPV screening.** / Every 3 years from ages 57 years through ages 64 or 45 years with a history of 3 consecutive normal Pap tests. Testing can be stopped between 65 and 70 years with 3 consecutive normal Pap tests and no abnormal Pap or HPV tests in the past 10 years.  Fecal occult blood test (FOBT) of stool. / Every year beginning at age 15 years and continuing until age 17 years. You may not need to do this test if you get a colonoscopy every 10 years.  Flexible sigmoidoscopy or colonoscopy.** / Every 5 years for a flexible sigmoidoscopy or every 10 years for a colonoscopy beginning at age 86 years and continuing until age 71 years.  Hepatitis C blood test.** / For all people born from [birth year] through 1963 and any individual with known risks for hepatitis C.  Osteoporosis screening.** / A one-time screening for women ages 83 years and over and women at risk for fractures or osteoporosis.  Skin self-exam. / Monthly.  Influenza vaccine. / Every year.  Tetanus, diphtheria, and acellular pertussis (Tdap/Td) vaccine.** / 1 dose of Td every 10 years.  Varicella vaccine.** / Consult your health care provider.  Zoster vaccine.** / 1 dose for adults aged 61 years or older.  Pneumococcal 13-valent conjugate (PCV13) vaccine.** / Consult your health care provider.  Pneumococcal  polysaccharide (PPSV23) vaccine.** / 1 dose for all adults aged 28 years and older.  Meningococcal vaccine.** / Consult your health care provider.  Hepatitis A vaccine.** / Consult your health care provider.  Hepatitis B vaccine.** / Consult your health care provider.  Haemophilus influenzae type b (Hib) vaccine.** / Consult your health care provider. ** Family history and personal history of risk and conditions may change your health care provider's recommendations. Document Released: 05/14/2001 Document Revised: 08/02/2013 Document Reviewed: 08/13/2010 [facility] Patient Information 2015 [place], [place]. This information is not intended to replace advice given to you by your health care provider. Make sure you discuss any questions you have with your health care provider.

## 2014-06-22 ENCOUNTER — Other Ambulatory Visit: Payer: Self-pay

## 2014-06-22 ENCOUNTER — Other Ambulatory Visit: Payer: Self-pay | Admitting: Family Medicine

## 2014-06-22 DIAGNOSIS — R748 Abnormal levels of other serum enzymes: Secondary | ICD-10-CM

## 2014-07-24 ENCOUNTER — Other Ambulatory Visit: Payer: Self-pay | Admitting: Family Medicine

## 2014-07-28 ENCOUNTER — Ambulatory Visit (INDEPENDENT_AMBULATORY_CARE_PROVIDER_SITE_OTHER): Payer: BLUE CROSS/BLUE SHIELD | Admitting: Family Medicine

## 2014-07-28 VITALS — BP 116/82 | HR 79 | Temp 98.8°F | Resp 16 | Ht 68.11 in | Wt 240.0 lb

## 2014-07-28 DIAGNOSIS — I10 Essential (primary) hypertension: Secondary | ICD-10-CM | POA: Diagnosis not present

## 2014-07-28 DIAGNOSIS — E1165 Type 2 diabetes mellitus with hyperglycemia: Secondary | ICD-10-CM | POA: Diagnosis not present

## 2014-07-28 DIAGNOSIS — J320 Chronic maxillary sinusitis: Secondary | ICD-10-CM | POA: Diagnosis not present

## 2014-07-28 MED ORDER — MONTELUKAST SODIUM 10 MG PO TABS: 10.0000 mg | ORAL_TABLET | Freq: Every day | ORAL | Status: DC

## 2014-07-28 MED ORDER — PSEUDOEPHEDRINE HCL ER 120 MG PO TB12: 120.0000 mg | ORAL_TABLET | Freq: Two times a day (BID) | ORAL | Status: DC

## 2014-07-28 MED ORDER — OXYMETAZOLINE HCL 0.05 % NA SOLN: 1.0000 | Freq: Two times a day (BID) | NASAL | Status: DC

## 2014-07-28 MED ORDER — LEVOFLOXACIN 750 MG PO TABS: 750.0000 mg | ORAL_TABLET | Freq: Every day | ORAL | Status: DC

## 2014-07-28 NOTE — Patient Instructions (Addendum)
Your sugars are likely higher due to infection/illness - if they do not come down soon, you may need to have your insulin adjusted while you are sick. Try Afrin to open up your nose and get some relief from the congestion but you cannot use this longer than 3 days or else you will get worsening congestion whenever the medicine wears off. Try sudafed (behind the counter) but this will elevate your blood pressure so if your pressure is getting >150 then stop it.  Also stop it as soon as your are getting some relief of your symptoms. If your congestion is resolved within 1 week, then you can stop the antibiotic levaquoin after 10 days. Start probiotics to avoid stomach upset and yeast infection.   Sinusitis Sinusitis is redness, soreness, and inflammation of the paranasal sinuses. Paranasal sinuses are air pockets within the bones of your face (beneath the eyes, the middle of the forehead, or above the eyes). In healthy paranasal sinuses, mucus is able to drain out, and air is able to circulate through them by way of your nose. However, when your paranasal sinuses are inflamed, mucus and air can become trapped. This can allow bacteria and other germs to grow and cause infection. Sinusitis can develop quickly and last only a short time (acute) or continue over a long period (chronic). Sinusitis that lasts for more than 12 weeks is considered chronic.  CAUSES  Causes of sinusitis include:  Allergies.  Structural abnormalities, such as displacement of the cartilage that separates your nostrils (deviated septum), which can decrease the air flow through your nose and sinuses and affect sinus drainage.  Functional abnormalities, such as when the small hairs (cilia) that line your sinuses and help remove mucus do not work properly or are not present. SIGNS AND SYMPTOMS  Symptoms of acute and chronic sinusitis are the same. The primary symptoms are pain and pressure around the affected sinuses. Other  symptoms include:  Upper toothache.  Earache.  Headache.  Bad breath.  Decreased sense of smell and taste.  A cough, which worsens when you are lying flat.  Fatigue.  Fever.  Thick drainage from your nose, which often is green and may contain pus (purulent).  Swelling and warmth over the affected sinuses. DIAGNOSIS  Your health care provider will perform a physical exam. During the exam, your health care provider may:  Look in your nose for signs of abnormal growths in your nostrils (nasal polyps).  Tap over the affected sinus to check for signs of infection.  View the inside of your sinuses (endoscopy) using an imaging device that has a light attached (endoscope). If your health care provider suspects that you have chronic sinusitis, one or more of the following tests may be recommended:  Allergy tests.  Nasal culture. A sample of mucus is taken from your nose, sent to a lab, and screened for bacteria.  Nasal cytology. A sample of mucus is taken from your nose and examined by your health care provider to determine if your sinusitis is related to an allergy. TREATMENT  Most cases of acute sinusitis are related to a viral infection and will resolve on their own within 10 days. Sometimes medicines are prescribed to help relieve symptoms (pain medicine, decongestants, nasal steroid sprays, or saline sprays).  However, for sinusitis related to a bacterial infection, your health care provider will prescribe antibiotic medicines. These are medicines that will help kill the bacteria causing the infection.  Rarely, sinusitis is caused by a fungal  infection. In theses cases, your health care provider will prescribe antifungal medicine. For some cases of chronic sinusitis, surgery is needed. Generally, these are cases in which sinusitis recurs more than 3 times per year, despite other treatments. HOME CARE INSTRUCTIONS   Drink plenty of water. Water helps thin the mucus so your  sinuses can drain more easily.  Use a humidifier.  Inhale steam 3 to 4 times a day (for example, sit in the bathroom with the shower running).  Apply a warm, moist washcloth to your face 3 to 4 times a day, or as directed by your health care provider.  Use saline nasal sprays to help moisten and clean your sinuses.  Take medicines only as directed by your health care provider.  If you were prescribed either an antibiotic or antifungal medicine, finish it all even if you start to feel better. SEEK IMMEDIATE MEDICAL CARE IF:  You have increasing pain or severe headaches.  You have nausea, vomiting, or drowsiness.  You have swelling around your face.  You have vision problems.  You have a stiff neck.  You have difficulty breathing. MAKE SURE YOU:   Understand these instructions.  Will watch your condition.  Will get help right away if you are not doing well or get worse. Document Released: 03/18/2005 Document Revised: 08/02/2013 Document Reviewed: 04/02/2011 [facility] Patient Information 2015 [place]. This information is not intended to replace advice given to you by your health care provider. Make sure you discuss any questions you have with your health care provider.

## 2014-07-28 NOTE — Progress Notes (Signed)
Subjective:  This chart was scribed for Norberto Sorenson, MD by Charline Bills, ED Scribe. The patient was seen in room 9. Patient's care was started at 1:33 PM.   Patient ID: Kathleen Jordan, female    DOB: Aug 27, 1970, 44 y.o.   MRN: [id number]  Chief Complaint  Patient presents with  . Nasal Congestion    Onset 3 days. Off and on for 3 months  . Cough   HPI  HPI Comments: Kathleen Jordan is a 44 y.o. female, with a h/o HTN, hyperlipidemia, DM, hypopotassemia, who presents to the [facility] complaining of intermittent nasal congestion with yellow mucous for the past 3 months, gradually worsening and persistent for the past 3 days. She reports associated bilateral sinus pressure, HA, chills. She denies fever, postnasal drip, cough, wheezing, chest tightness at this time. Pt has been treating with a humidifier, Mucinex, Vick's Vapor Rub, Azelatine-Fluticasone nasal spray and Zyrtec. She has tried 875 mg Amox x10 days in February with relief. She reports h/o environmental allergies but reports more than usual. No h/o asthma. Pt has not been seen by an allergist. No known antibiotic allergies.   Diabetes Pt reports recently elevated blood glucose, averaging 200s. Pt was last seen by her endocrinologist 2 days ago. Her last A1C was done by Dr. Loreen Freud on 06/20/14 at 11.5.  Past Medical History  Diagnosis Date  . Hypertension   . Hyperlipidemia   . PCOS (polycystic ovarian syndrome)   . Allergic rhinitis   . Obesity   . Hx of varicella   . Hypopotassemia   . Diabetes mellitus     insulin  . Sleep apnea   . Difficult intubation     pt states with breast reduction surgery MD's had difficulty  . Postpartum care following vaginal delivery 11/13/2012  . Postpartum care following vaginal delivery (8/15) 11/13/2012   Current Outpatient Prescriptions on File Prior to Visit  Medication Sig Dispense Refill  . Azelastine-Fluticasone 137-50 MCG/ACT SUSP Place 1  spray into the nose 2 (two) times daily as needed (congestion).    . B-D UF III MINI PEN NEEDLES 31G X 5 MM MISC   6  . cetirizine (ZYRTEC) 10 MG tablet TAKE 1 TABLET BY MOUTH DAILY 30 tablet 11  . hyoscyamine (LEVBID) 0.375 MG 12 hr tablet TAKE 1 TABLET BY MOUTH TWICE DAILY 60 tablet 5  . insulin lispro (HUMALOG KWIKPEN) 100 UNIT/ML KiwkPen Inject 10 Units into the skin as needed. SLIDING SCALE    . Insulin NPH, Human,, Isophane, (HUMULIN N PEN) 100 UNIT/ML Kiwkpen 25 UNITS IN THE MORNING AND 35 UNITS IN THE EVENING    . NIFEdipine (NIFEDICAL XL) 60 MG 24 hr tablet 1 tab by mouth daily 90 tablet 1  . NORA-BE 0.35 MG tablet     . prenatal vitamin w/FE, FA (PRENATAL 1 + 1) 27-1 MG TABS tablet     . WELCHOL 625 MG tablet TAKE 3 TABLETS BY MOUTH TWICE DAILY WITH A MEAL 180 tablet 2  . fluticasone (FLONASE) 50 MCG/ACT nasal spray SPRAY TWICE IN EACH NOSTRIL ONCE DAILY AS NEEDED (Patient not taking: Reported on 07/28/2014) 16 g 5  . HYDROcodone-homatropine (HYCODAN) 5-1.5 MG/5ML syrup Take 5 mLs by mouth every 8 (eight) hours as needed for cough. (Patient not taking: Reported on 07/28/2014) 120 mL 0   No current facility-administered medications on file prior to visit.   No Known Allergies  Review of Systems  Constitutional: Positive for chills,  diaphoresis, activity change, appetite change and fatigue. Negative for fever.  HENT: Positive for congestion, rhinorrhea and sinus pressure. Negative for postnasal drip.   Respiratory: Negative for cough, chest tightness, shortness of breath and wheezing.   Cardiovascular: Negative for chest pain and palpitations.  Gastrointestinal: Negative for vomiting.  Musculoskeletal: Positive for back pain and arthralgias.  Skin: Negative for color change.  Allergic/Immunologic: Positive for environmental allergies. Negative for food allergies.  Neurological: Positive for headaches. Negative for syncope, facial asymmetry and light-headedness.    Psychiatric/Behavioral: Positive for sleep disturbance.   BP 116/82 mmHg  Pulse 79  Temp(Src) 98.8 F (37.1 C) (Oral)  Resp 16  Ht 5' 8.11" (1.73 m)  Wt 240 lb (108.863 kg)  BMI 36.37 kg/m2  SpO2 98%  LMP 07/18/2014    Objective:   Physical Exam  Constitutional: She is oriented to person, place, and time. She appears well-developed and well-nourished. No distress.  HENT:  Head: Normocephalic and atraumatic.  Right Ear: Tympanic membrane normal.  Left Ear: Tympanic membrane is erythematous and retracted. A middle ear effusion is present.  Nose: Mucosal edema and rhinorrhea present.  Mouth/Throat: Posterior oropharyngeal edema present. No oropharyngeal exudate or posterior oropharyngeal erythema.  Nose: Nasal mucosal erythematous, edematous, clear rhinorrhea.  Mouth/Throat: 1+ tonsils  Eyes: Conjunctivae and EOM are normal.  Neck: Neck supple. No tracheal deviation present.  Cardiovascular: Normal rate, regular rhythm, S2 normal and normal heart sounds.   No murmur heard. Pulmonary/Chest: Effort normal and breath sounds normal. No respiratory distress.  Lungs are clear to auscultation.   Musculoskeletal: Normal range of motion.  Lymphadenopathy:       Head (right side): No submandibular adenopathy present.       Head (left side): No submandibular adenopathy present.  Neurological: She is alert and oriented to person, place, and time.  Skin: Skin is warm and dry.  Psychiatric: She has a normal mood and affect. Her behavior is normal.  Nursing note and vitals reviewed.     Assessment & Plan:   1. Chronic maxillary sinusitis - can try afrin x 3d only as do not want to use decongestants due to HTN  2. Essential hypertension - cont to monitor bp at home  3. Type 2 diabetes mellitus with hyperglycemia - cbgs will be elevated w/ acute infection so f/u here or w/ PCP if increase is sig for insulin dose adjustment.  Start probiotic/yogurt while on levaquin.   Meds ordered this  encounter  Medications  . levofloxacin (LEVAQUIN) 750 MG tablet    Sig: Take 1 tablet (750 mg total) by mouth daily.    Dispense:  14 tablet    Refill:  0  . montelukast (SINGULAIR) 10 MG tablet    Sig: Take 1 tablet (10 mg total) by mouth at bedtime.    Dispense:  30 tablet    Refill:  3  . pseudoephedrine (SUDAFED 12 HOUR) 120 MG 12 hr tablet    Sig: Take 1 tablet (120 mg total) by mouth 2 (two) times daily.    Dispense:  30 tablet    Refill:  0  . oxymetazoline (AFRIN NASAL SPRAY) 0.05 % nasal spray    Sig: Place 1 spray into both nostrils 2 (two) times daily. Do not use for more than 3d.    Dispense:  15 mL    Refill:  0    I personally performed the services described in this documentation, which was scribed in my presence. The recorded information has been  reviewed and considered, and addended by me as needed.  Norberto SorensonEva Ananiah Maciolek, MD MPH

## 2014-08-11 ENCOUNTER — Other Ambulatory Visit: Payer: Self-pay | Admitting: Family Medicine

## 2014-10-12 ENCOUNTER — Other Ambulatory Visit: Payer: Self-pay

## 2014-10-12 MED ORDER — HYOSCYAMINE SULFATE ER 0.375 MG PO TB12: 0.3750 mg | ORAL_TABLET | Freq: Two times a day (BID) | ORAL | Status: DC

## 2015-01-05 ENCOUNTER — Other Ambulatory Visit: Payer: Self-pay

## 2015-01-05 DIAGNOSIS — I10 Essential (primary) hypertension: Secondary | ICD-10-CM

## 2015-01-05 MED ORDER — NIFEDIPINE ER OSMOTIC RELEASE 60 MG PO TB24: ORAL_TABLET | ORAL | Status: DC

## 2015-01-05 MED ORDER — COLESEVELAM HCL 625 MG PO TABS: 1875.0000 mg | ORAL_TABLET | Freq: Two times a day (BID) | ORAL | Status: DC

## 2015-03-03 ENCOUNTER — Other Ambulatory Visit: Payer: Self-pay | Admitting: Family Medicine

## 2015-04-07 ENCOUNTER — Ambulatory Visit (INDEPENDENT_AMBULATORY_CARE_PROVIDER_SITE_OTHER): Payer: Managed Care, Other (non HMO) | Admitting: Family Medicine

## 2015-04-07 ENCOUNTER — Encounter: Payer: Self-pay | Admitting: Family Medicine

## 2015-04-07 VITALS — BP 144/80 | HR 94 | Temp 99.2°F | Wt 246.6 lb

## 2015-04-07 DIAGNOSIS — J0140 Acute pansinusitis, unspecified: Secondary | ICD-10-CM

## 2015-04-07 MED ORDER — AMOXICILLIN-POT CLAVULANATE 875-125 MG PO TABS
1.0000 | ORAL_TABLET | Freq: Two times a day (BID) | ORAL | Status: DC
Start: 2015-04-07 — End: 2015-09-07

## 2015-04-07 NOTE — Patient Instructions (Signed)

## 2015-04-07 NOTE — Progress Notes (Signed)
Pre visit review using our clinic review tool, if applicable. No additional management support is needed unless otherwise documented below in the visit note. 

## 2015-04-07 NOTE — Progress Notes (Signed)
  Subjective:     Kathleen Jordan is a 45 y.o. female who presents for evaluation of sinus pain. Symptoms include: congestion, cough, facial pain, headaches, nasal congestion, post nasal drip, sinus pressure and sore throat. Onset of symptoms was 3 weeks ago. Symptoms have been gradually worsening since that time. Past history is significant for asthma. Patient is a non-smoker.  The following portions of the patient's history were reviewed and updated as appropriate:  She  has a past medical history of Hypertension; Hyperlipidemia; PCOS (polycystic ovarian syndrome); Allergic rhinitis; Obesity; varicella; Hypopotassemia; Diabetes mellitus; Sleep apnea; Difficult intubation; Postpartum care following vaginal delivery (11/13/2012); and Postpartum care following vaginal delivery (8/15) (11/13/2012). She  does not have any pertinent problems on file. She  has past surgical history that includes Breast reduction surgery (07-2003) and invitro fertilization (11/16/2010). Her family history includes Allergies in her sister; Arthritis in her maternal grandmother and mother; Asthma in her sister; Cancer in her maternal grandfather and maternal grandmother; Diabetes in her brother, maternal grandmother, and mother; Heart disease (age of onset: 5658) in her mother; Hypertension in her brother, maternal grandmother, and mother; Stroke in her maternal grandfather. She  reports that she has never smoked. She has never used smokeless tobacco. She reports that she does not drink alcohol or use illicit drugs. She has a current medication list which includes the following prescription(s): b-d uf iii mini pen needles, bydureon, cetirizine, colesevelam, fluticasone, hyoscyamine, insulin lispro, insulin nph (human) (isophane), nifedical xl, and amoxicillin-clavulanate. Current Outpatient Prescriptions on File Prior to Visit  Medication Sig Dispense Refill  . B-D UF III MINI PEN NEEDLES 31G X 5 MM MISC   6  . cetirizine  (ZYRTEC) 10 MG tablet TAKE 1 TABLET BY MOUTH DAILY 30 tablet 11  . colesevelam (WELCHOL) 625 MG tablet Take 3 tablets (1,875 mg total) by mouth 2 (two) times daily with a meal. 450 tablet 0  . fluticasone (FLONASE) 50 MCG/ACT nasal spray SPRAY TWICE IN EACH NOSTRIL ONCE DAILY AS NEEDED 16 g 5  . hyoscyamine (LEVBID) 0.375 MG 12 hr tablet Take 1 tablet (0.375 mg total) by mouth 2 (two) times daily. 180 tablet 1  . insulin lispro (HUMALOG KWIKPEN) 100 UNIT/ML KiwkPen Inject 10 Units into the skin as needed. SLIDING SCALE    . Insulin NPH, Human,, Isophane, (HUMULIN N PEN) 100 UNIT/ML Kiwkpen 25 UNITS IN THE MORNING AND 35 UNITS IN THE EVENING    . NIFEDICAL XL 60 MG 24 hr tablet TAKE 1 TABLET BY MOUTH DAILY 90 tablet 1   No current facility-administered medications on file prior to visit.   She has No Known Allergies..  Review of Systems Pertinent items are noted in HPI.   Objective:    BP 144/80 mmHg  Pulse 94  Temp(Src) 99.2 F (37.3 C) (Oral)  Wt 246 lb 9.6 oz (111.857 kg)  SpO2 97% General appearance: alert, cooperative, appears stated age and no distress Ears: + bulging tm b/l --  + fluid Nose: green and yellow discharge, moderate congestion, turbinates red, swollen, sinus tenderness bilateral Throat: abnormal findings: mild oropharyngeal erythema Neck: mild anterior cervical adenopathy and thyroid not enlarged, symmetric, no tenderness/mass/nodules Lungs: clear to auscultation bilaterally Heart: S1, S2 normal    Assessment:    Acute bacterial sinusitis.    Plan:    Nasal steroids per medication orders. Antihistamines per medication orders. Augmentin per medication orders.

## 2015-05-04 ENCOUNTER — Other Ambulatory Visit: Payer: Self-pay | Admitting: Family Medicine

## 2015-06-04 ENCOUNTER — Emergency Department (HOSPITAL_BASED_OUTPATIENT_CLINIC_OR_DEPARTMENT_OTHER)
Admission: EM | Admit: 2015-06-04 | Discharge: 2015-06-04 | Disposition: A | Discharge status: Home / Self Care | Payer: Managed Care, Other (non HMO) | Attending: Emergency Medicine | Admitting: Emergency Medicine

## 2015-06-04 ENCOUNTER — Emergency Department (HOSPITAL_BASED_OUTPATIENT_CLINIC_OR_DEPARTMENT_OTHER): Payer: Managed Care, Other (non HMO)

## 2015-06-04 ENCOUNTER — Encounter (HOSPITAL_BASED_OUTPATIENT_CLINIC_OR_DEPARTMENT_OTHER): Payer: Self-pay | Admitting: Emergency Medicine

## 2015-06-04 DIAGNOSIS — Z79899 Other long term (current) drug therapy: Secondary | ICD-10-CM | POA: Diagnosis not present

## 2015-06-04 DIAGNOSIS — Z792 Long term (current) use of antibiotics: Secondary | ICD-10-CM | POA: Diagnosis not present

## 2015-06-04 DIAGNOSIS — J159 Unspecified bacterial pneumonia: Secondary | ICD-10-CM | POA: Insufficient documentation

## 2015-06-04 DIAGNOSIS — J111 Influenza due to unidentified influenza virus with other respiratory manifestations: Secondary | ICD-10-CM | POA: Diagnosis not present

## 2015-06-04 DIAGNOSIS — E119 Type 2 diabetes mellitus without complications: Secondary | ICD-10-CM | POA: Insufficient documentation

## 2015-06-04 DIAGNOSIS — E785 Hyperlipidemia, unspecified: Secondary | ICD-10-CM | POA: Diagnosis not present

## 2015-06-04 DIAGNOSIS — I10 Essential (primary) hypertension: Secondary | ICD-10-CM | POA: Insufficient documentation

## 2015-06-04 DIAGNOSIS — R000 Tachycardia, unspecified: Secondary | ICD-10-CM | POA: Diagnosis not present

## 2015-06-04 DIAGNOSIS — Z3202 Encounter for pregnancy test, result negative: Secondary | ICD-10-CM | POA: Diagnosis not present

## 2015-06-04 DIAGNOSIS — Z8669 Personal history of other diseases of the nervous system and sense organs: Secondary | ICD-10-CM | POA: Diagnosis not present

## 2015-06-04 DIAGNOSIS — Z8619 Personal history of other infectious and parasitic diseases: Secondary | ICD-10-CM | POA: Insufficient documentation

## 2015-06-04 DIAGNOSIS — E669 Obesity, unspecified: Secondary | ICD-10-CM | POA: Insufficient documentation

## 2015-06-04 DIAGNOSIS — Z794 Long term (current) use of insulin: Secondary | ICD-10-CM | POA: Diagnosis not present

## 2015-06-04 DIAGNOSIS — R69 Illness, unspecified: Secondary | ICD-10-CM

## 2015-06-04 DIAGNOSIS — R52 Pain, unspecified: Secondary | ICD-10-CM | POA: Diagnosis present

## 2015-06-04 DIAGNOSIS — J189 Pneumonia, unspecified organism: Secondary | ICD-10-CM

## 2015-06-04 LAB — BASIC METABOLIC PANEL
Anion gap: 11 (ref 5–15)
BUN: 8 mg/dL (ref 6–20)
CHLORIDE: 103 mmol/L (ref 101–111)
CO2: 20 mmol/L — AB (ref 22–32)
Calcium: 8.6 mg/dL — ABNORMAL LOW (ref 8.9–10.3)
Creatinine, Ser: 0.72 mg/dL (ref 0.44–1.00)
GFR calc Af Amer: >60 mL/min (ref >60)
GFR calc non Af Amer: >60 mL/min (ref >60)
Glucose, Bld: 199 mg/dL — ABNORMAL HIGH (ref 65–99)
POTASSIUM: 3.1 mmol/L — AB (ref 3.5–5.1)
SODIUM: 134 mmol/L — AB (ref 135–145)

## 2015-06-04 LAB — CBC WITH DIFFERENTIAL/PLATELET
Basophils Absolute: 0 10*3/uL (ref 0.0–0.1)
Basophils Relative: 0 %
Eosinophils Absolute: 0 10*3/uL (ref 0.0–0.7)
Eosinophils Relative: 0 %
HEMATOCRIT: 38.7 % (ref 36.0–46.0)
HEMOGLOBIN: 13.3 g/dL (ref 12.0–15.0)
LYMPHS ABS: 0.6 10*3/uL — AB (ref 0.7–4.0)
Lymphocytes Relative: 12 %
MCH: 30 pg (ref 26.0–34.0)
MCHC: 34.4 g/dL (ref 30.0–36.0)
MCV: 87.2 fL (ref 78.0–100.0)
MONOS PCT: 10 %
Monocytes Absolute: 0.5 10*3/uL (ref 0.1–1.0)
NEUTROS PCT: 78 %
Neutro Abs: 3.8 10*3/uL (ref 1.7–7.7)
Platelets: 191 10*3/uL (ref 150–400)
RBC: 4.44 MIL/uL (ref 3.87–5.11)
RDW: 12.7 % (ref 11.5–15.5)
WBC: 4.9 10*3/uL (ref 4.0–10.5)

## 2015-06-04 LAB — URINALYSIS, ROUTINE W REFLEX MICROSCOPIC
BILIRUBIN URINE: NEGATIVE
Glucose, UA: 100 mg/dL — AB
Ketones, ur: 15 mg/dL — AB
Leukocytes, UA: NEGATIVE
NITRITE: NEGATIVE
Protein, ur: 100 mg/dL — AB
Specific Gravity, Urine: 1.027 (ref 1.005–1.030)
pH: 6 (ref 5.0–8.0)

## 2015-06-04 LAB — CBG MONITORING, ED: GLUCOSE-CAPILLARY: 195 mg/dL — AB (ref 65–99)

## 2015-06-04 LAB — URINE MICROSCOPIC-ADD ON

## 2015-06-04 LAB — PREGNANCY, URINE: PREG TEST UR: NEGATIVE

## 2015-06-04 MED ORDER — IBUPROFEN 400 MG PO TABS
600.0000 mg | ORAL_TABLET | Freq: Once | ORAL | Status: AC
  Administered 2015-06-04: 600 mg via ORAL
  Filled 2015-06-04: qty 1

## 2015-06-04 MED ORDER — ACETAMINOPHEN 500 MG PO TABS
1000.0000 mg | ORAL_TABLET | Freq: Once | ORAL | Status: AC
  Administered 2015-06-04: 1000 mg via ORAL
  Filled 2015-06-04: qty 2

## 2015-06-04 MED ORDER — DOXYCYCLINE HYCLATE 100 MG PO CAPS: 100.0000 mg | ORAL_CAPSULE | Freq: Two times a day (BID) | ORAL | Status: DC

## 2015-06-04 MED ORDER — DOXYCYCLINE HYCLATE 100 MG PO TABS
100.0000 mg | ORAL_TABLET | Freq: Once | ORAL | Status: AC
  Administered 2015-06-04: 100 mg via ORAL
  Filled 2015-06-04: qty 1

## 2015-06-04 MED ORDER — POTASSIUM CHLORIDE CRYS ER 20 MEQ PO TBCR
40.0000 meq | EXTENDED_RELEASE_TABLET | Freq: Once | ORAL | Status: AC
  Administered 2015-06-04: 40 meq via ORAL
  Filled 2015-06-04: qty 2

## 2015-06-04 MED ORDER — ACETAMINOPHEN 325 MG PO TABS: 650.0000 mg | ORAL_TABLET | Freq: Once | ORAL | Status: DC

## 2015-06-04 NOTE — Discharge Instructions (Signed)
Your chest x-ray shows possible pneumonia. Please take antibiotics as prescribed. Return without fail for worsening symptoms, including difficulty breathing, worsening pain, or any other symptoms concerning to you.  Community-Acquired Pneumonia, Adult Pneumonia is an infection of the lungs. There are different types of pneumonia. One type can develop while a person is in a hospital. A different type, called community-acquired pneumonia, develops in people who are not, or have not recently been, in the hospital or other health care facility.  CAUSES Pneumonia may be caused by bacteria, viruses, or funguses. Community-acquired pneumonia is often caused by Streptococcus pneumonia bacteria. These bacteria are often passed from one person to another by breathing in droplets from the cough or sneeze of an infected person. RISK FACTORS The condition is more likely to develop in:  People who havechronic diseases, such as chronic obstructive pulmonary disease (COPD), asthma, congestive heart failure, cystic fibrosis, diabetes, or kidney disease.  People who haveearly-stage or late-stage HIV.  People who havesickle cell disease.  People who havehad their spleen removed (splenectomy).  People who havepoor Administratordental hygiene.  People who havemedical conditions that increase the risk of breathing in (aspirating) secretions their own mouth and nose.   People who havea weakened immune system (immunocompromised).  People who smoke.  People whotravel to areas where pneumonia-causing germs commonly exist.  People whoare around animal habitats or animals that have pneumonia-causing germs, including birds, bats, rabbits, cats, and farm animals. SYMPTOMS Symptoms of this condition include:  Adry cough.  A wet (productive) cough.  Fever.  Sweating.  Chest pain, especially when breathing deeply or coughing.  Rapid breathing or difficulty breathing.  Shortness of breath.  Shaking  chills.  Fatigue.  Muscle aches. DIAGNOSIS Your health care provider will take a medical history and perform a physical exam. You may also have other tests, including:  Imaging studies of your chest, including X-rays.  Tests to check your blood oxygen level and other blood gases.  Other tests on blood, mucus (sputum), fluid around your lungs (pleural fluid), and urine. If your pneumonia is severe, other tests may be done to identify the specific cause of your illness. TREATMENT The type of treatment that you receive depends on many factors, such as the cause of your pneumonia, the medicines you take, and other medical conditions that you have. For most adults, treatment and recovery from pneumonia may occur at home. In some cases, treatment must happen in a hospital. Treatment may include:  Antibiotic medicines, if the pneumonia was caused by bacteria.  Antiviral medicines, if the pneumonia was caused by a virus.  Medicines that are given by mouth or through an IV tube.  Oxygen.  Respiratory therapy. Although rare, treating severe pneumonia may include:  Mechanical ventilation. This is done if you are not breathing well on your own and you cannot maintain a safe blood oxygen level.  Thoracentesis. This procedureremoves fluid around one lung or both lungs to help you breathe better. HOME CARE INSTRUCTIONS  Take over-the-counter and prescription medicines only as told by your health care provider.  Only takecough medicine if you are losing sleep. Understand that cough medicine can prevent your body's natural ability to remove mucus from your lungs.  If you were prescribed an antibiotic medicine, take it as told by your health care provider. Do not stop taking the antibiotic even if you start to feel better.  Sleep in a semi-upright position at night. Try sleeping in a reclining chair, or place a few pillows under your head.  Do not use tobacco products, including cigarettes,  chewing tobacco, and e-cigarettes. If you need help quitting, ask your health care provider.  Drink enough water to keep your urine clear or pale yellow. This will help to thin out mucus secretions in your lungs. PREVENTION There are ways that you can decrease your risk of developing community-acquired pneumonia. Consider getting a pneumococcal vaccine if:  You are older than 43 years of age.  You are older than 43 years of age and are undergoing cancer treatment, have chronic lung disease, or have other medical conditions that affect your immune system. Ask your health care provider if this applies to you. There are different types and schedules of pneumococcal vaccines. Ask your health care provider which vaccination option is best for you. You may also prevent community-acquired pneumonia if you take these actions:  Get an influenza vaccine every year. Ask your health care provider which type of influenza vaccine is best for you.  Go to the dentist on a regular basis.  Wash your hands often. Use hand sanitizer if soap and water are not available. SEEK MEDICAL CARE IF:  You have a fever.  You are losing sleep because you cannot control your cough with cough medicine. SEEK IMMEDIATE MEDICAL CARE IF:  You have worsening shortness of breath.  You have increased chest pain.  Your sickness becomes worse, especially if you are an older adult or have a weakened immune system.  You cough up blood.   This information is not intended to replace advice given to you by your health care provider. Make sure you discuss any questions you have with your health care provider.   Document Released: 03/18/2005 Document Revised: 12/07/2014 Document Reviewed: 07/13/2014 Elsevier Interactive Patient Education [employer]

## 2015-06-04 NOTE — ED Notes (Signed)
Patient states that she started to have cold type symptoms on Thursday. She reports that now she has started to have a fever and cough and generalized body aches. Reports that it is painful to cough, the patient reports that she took 2 pills of Motrin at 4 am.

## 2015-06-04 NOTE — ED Provider Notes (Signed)
CSN: [id number]     Arrival date & time 06/04/15  0630 History   First MD Initiated Contact with Patient 06/04/15 [phone]     Chief Complaint  Patient presents with  . Generalized Body Aches     (Consider location/radiation/quality/duration/timing/severity/associated sxs/prior Treatment) HPI 45 year old female who presents with influenza-like symptoms. History of hypertension, hyperlipidemia and type 2 diabetes. States that 4 days ago had mild cold-like symptoms of nonproductive cough, runny nose, nasal congestion. Yesterday, developed myalgias, fever, chills, chest congestion, and productive cough. Had 2 episodes of vomiting yesterday, but has been tolerating by mouth intake since then. No abdominal pain, diarrhea, severe headaches, chest pain, or difficulty breathing.  Co-worker had flu last week and her son currently at home sick with viral illness.  Past Medical History  Diagnosis Date  . Hypertension   . Hyperlipidemia   . PCOS (polycystic ovarian syndrome)   . Allergic rhinitis   . Obesity   . Hx of varicella   . Hypopotassemia   . Diabetes mellitus     insulin  . Sleep apnea   . Difficult intubation     pt states with breast reduction surgery MD's had difficulty  . Postpartum care following vaginal delivery 11/13/2012  . Postpartum care following vaginal delivery (8/15) 11/13/2012   Past Surgical History  Procedure Laterality Date  . Breast reduction surgery  07-2003  . Invitro fertilization  11/16/2010    [facility]   Family History  Problem Relation Age of Onset  . Arthritis Mother   . Diabetes Mother   . Hypertension Mother   . Heart disease Mother 3    MI  . Diabetes Brother   . Hypertension Brother   . Arthritis Maternal Grandmother   . Diabetes Maternal Grandmother   . Hypertension Maternal Grandmother   . Cancer Maternal Grandmother   . Stroke Maternal Grandfather   . Cancer Maternal Grandfather   . Asthma Sister   . Allergies Sister    Social History   Substance Use Topics  . Smoking status: Never Smoker   . Smokeless tobacco: Never Used  . Alcohol Use: No   OB History    Gravida Para Term Preterm AB TAB SAB Ectopic Multiple Living   0 2 0 2 0 0 1     Review of Systems 10/14 systems reviewed and are negative other than those stated in the HPI    Allergies  Review of patient's allergies indicates no known allergies.  Home Medications   Prior to Admission medications   Medication Sig Start Date End Date Taking? Authorizing Provider  amoxicillin-clavulanate (AUGMENTIN) 875-125 MG tablet Take 1 tablet by mouth 2 (two) times daily. 04/07/15   Lelon Perla, DO  B-D UF III MINI PEN NEEDLES 31G X 5 MM MISC  05/06/14   Historical Provider, MD  BYDUREON 2 MG PEN  03/27/15   Historical Provider, MD  cetirizine (ZYRTEC) 10 MG tablet TAKE 1 TABLET BY MOUTH DAILY 08/11/14   Lelon Perla, DO  colesevelam [facility]) 625 MG tablet Take 3 tablets (1,875 mg total) by mouth 2 (two) times daily with a meal. 01/05/15   Lelon Perla, DO  doxycycline (VIBRAMYCIN) 100 MG capsule Take 1 capsule (100 mg total) by mouth 2 (two) times daily. 06/04/15   Lavera Guise, MD  fluticasone [facility]) 50 MCG/ACT nasal spray SPRAY TWICE IN EACH NOSTRIL ONCE DAILY AS NEEDED 04/14/14   Lelon Perla, DO  hyoscyamine (LEVBID) 0.375 MG  12 hr tablet Take 1 tablet (0.375 mg total) by mouth 2 (two) times daily. 10/12/14   Lelon Perla, DO  insulin lispro (HUMALOG KWIKPEN) 100 UNIT/ML KiwkPen Inject 10 Units into the skin as needed. SLIDING SCALE    Historical Provider, MD  Insulin NPH, Human,, Isophane, (HUMULIN N PEN) 100 UNIT/ML Kiwkpen 25 UNITS IN THE MORNING AND 35 UNITS IN THE EVENING 04/06/12   Historical Provider, MD  NIFEDICAL XL 60 MG 24 hr tablet TAKE 1 TABLET BY MOUTH DAILY 03/03/15   Lelon Perla, DO  WELCHOL 625 MG tablet TAKE 3 TABLETS BY MOUTH TWICE DAILY WITH A MEAL 05/04/15   Grayling Congress Lowne, DO   BP 129/81 mmHg  Pulse 96  Temp(Src) 99.8 F (37.7 C)  (Oral)  Resp 18  Ht  (1.727 m)  Wt 241 lb (109.317 kg)  BMI 36.65 kg/m2  SpO2 99%  LMP 06/01/2015 Physical Exam Physical Exam  Nursing note and vitals reviewed. Constitutional: Well developed, well nourished, non-toxic, and in no acute distress Head: Normocephalic and atraumatic.  Mouth/Throat: Oropharynx is clear and moist.  Neck: Normal range of motion. Neck supple.  Cardiovascular: Tachycardic rate and regular rhythm.  No edema. Pulmonary/Chest: Effort normal and breath sounds normal.  Abdominal: Soft. There is no tenderness. There is no rebound and no guarding.  Musculoskeletal: Normal range of motion.  Neurological: Alert, no facial droop, fluent speech, moves all extremities symmetrically Skin: Skin is warm and dry.  Psychiatric: Cooperative  ED Course  Procedures (including critical care time) Labs Review Labs Reviewed  URINALYSIS, ROUTINE W REFLEX MICROSCOPIC (NOT AT [facility]) - Abnormal; Notable for the following:    Color, Urine RED (*)    APPearance CLOUDY (*)    Glucose, UA 100 (*)    Hgb urine dipstick LARGE (*)    Ketones, ur 15 (*)    Protein, ur 100 (*)    All other components within normal limits  URINE MICROSCOPIC-ADD ON - Abnormal; Notable for the following:    Squamous Epithelial / LPF 0-5 (*)    Bacteria, UA MANY (*)    All other components within normal limits  CBC WITH DIFFERENTIAL/PLATELET - Abnormal; Notable for the following:    Lymphs Abs 0.6 (*)    All other components within normal limits  BASIC METABOLIC PANEL - Abnormal; Notable for the following:    Sodium 134 (*)    Potassium 3.1 (*)    CO2 20 (*)    Glucose, Bld 199 (*)    Calcium 8.6 (*)    All other components within normal limits  CBG MONITORING, ED - Abnormal; Notable for the following:    Glucose-Capillary 195 (*)    All other components within normal limits  PREGNANCY, URINE  CBG MONITORING, ED    Imaging Review Dg Chest 2 View  06/04/2015  CLINICAL DATA:  Sick for 4  days with fever and cough today, nonsmoker EXAM: CHEST  2 VIEW COMPARISON:  08/24/2010 FINDINGS: The heart size and vascular pattern are normal. Left lung is clear and there are no pleural effusions. On the right, there is mild opacity move in the inferior perihilar region. IMPRESSION: Subtle infiltrate right perihilar region could represent atelectasis or developing pneumonia. Suggest radiographic followup. Electronically Signed   By: Esperanza Heir M.D.   On: 06/04/2015 07:23   I have personally reviewed and evaluated these images and lab results as part of my medical decision-making.   EKG Interpretation None  MDM   Final diagnoses:  Community acquired pneumonia  Influenza-like illness    45 year old female with history of hypertension, hyperlipidemia, and diabetes who presents with influenza-like symptoms. On presentation is nontoxic and in no acute distress. He is febrile with a temperature of 102 Fahrenheit, and mildly tachycardic in triage. Exam overall unremarkable. Chest x-ray does show possible infiltrate suggestive of pneumonia versus atelectasis. Blood work without significant leukocytosis or major metabolic or electrolyte derangements. Is given doxycycline for treatment of possible community-acquired pneumonia. Appropriate for outpatient treatment.  Tachycardia and fever resolves with Tylenol and Motrin. Discussed strict follow-up and return instructions. She expressed understanding of all discharge instructions and felt comfortable with the plan of care.   Lavera Guiseana Duo Liu, MD 06/04/15 1018

## 2015-06-04 NOTE — ED Notes (Signed)
Ambulatory with steady gait at discharge. Rx x 1 given for doxycycline

## 2015-06-05 ENCOUNTER — Other Ambulatory Visit: Payer: Self-pay | Admitting: Endocrinology

## 2015-06-05 DIAGNOSIS — G609 Hereditary and idiopathic neuropathy, unspecified: Secondary | ICD-10-CM

## 2015-06-05 LAB — HEMOGLOBIN A1C: Hemoglobin A1C: 9

## 2015-06-20 ENCOUNTER — Other Ambulatory Visit: Payer: Managed Care, Other (non HMO)

## 2015-08-30 ENCOUNTER — Other Ambulatory Visit: Payer: Self-pay

## 2015-08-30 MED ORDER — CETIRIZINE HCL 10 MG PO TABS: 10.0000 mg | ORAL_TABLET | Freq: Every day | ORAL | Status: DC

## 2015-09-07 ENCOUNTER — Encounter: Payer: Self-pay | Admitting: Family Medicine

## 2015-09-07 ENCOUNTER — Ambulatory Visit (INDEPENDENT_AMBULATORY_CARE_PROVIDER_SITE_OTHER): Payer: Managed Care, Other (non HMO) | Admitting: Family Medicine

## 2015-09-07 VITALS — BP 124/70 | HR 94 | Temp 98.6°F | Ht 68.0 in | Wt 246.0 lb

## 2015-09-07 DIAGNOSIS — I10 Essential (primary) hypertension: Secondary | ICD-10-CM

## 2015-09-07 DIAGNOSIS — E118 Type 2 diabetes mellitus with unspecified complications: Secondary | ICD-10-CM

## 2015-09-07 DIAGNOSIS — E785 Hyperlipidemia, unspecified: Secondary | ICD-10-CM

## 2015-09-07 DIAGNOSIS — IMO0002 Reserved for concepts with insufficient information to code with codable children: Secondary | ICD-10-CM

## 2015-09-07 DIAGNOSIS — E1165 Type 2 diabetes mellitus with hyperglycemia: Secondary | ICD-10-CM

## 2015-09-07 DIAGNOSIS — M2550 Pain in unspecified joint: Secondary | ICD-10-CM

## 2015-09-07 DIAGNOSIS — K589 Irritable bowel syndrome without diarrhea: Secondary | ICD-10-CM

## 2015-09-07 LAB — COMPREHENSIVE METABOLIC PANEL
ALBUMIN: 4.1 g/dL (ref 3.5–5.2)
ALK PHOS: 124 U/L — AB (ref 39–117)
ALT: 133 U/L — ABNORMAL HIGH (ref 0–35)
AST: 69 U/L — ABNORMAL HIGH (ref 0–37)
BUN: 10 mg/dL (ref 6–23)
CO2: 27 mEq/L (ref 19–32)
Calcium: 9.3 mg/dL (ref 8.4–10.5)
Chloride: 101 mEq/L (ref 96–112)
Creatinine, Ser: 0.68 mg/dL (ref 0.40–1.20)
GFR: 121.62 mL/min (ref >60.00)
Glucose, Bld: 238 mg/dL — ABNORMAL HIGH (ref 70–99)
Potassium: 3.3 mEq/L — ABNORMAL LOW (ref 3.5–5.1)
Sodium: 137 mEq/L (ref 135–145)
TOTAL PROTEIN: 7.6 g/dL (ref 6.0–8.3)
Total Bilirubin: 0.4 mg/dL (ref 0.2–1.2)

## 2015-09-07 LAB — POCT URINALYSIS DIPSTICK
Blood, UA: NEGATIVE
Ketones, UA: NEGATIVE
LEUKOCYTES UA: NEGATIVE
NITRITE UA: NEGATIVE
Spec Grav, UA: >=1.03
Urobilinogen, UA: 0.2
pH, UA: 6

## 2015-09-07 LAB — LIPID PANEL
Cholesterol: 132 mg/dL (ref 0–200)
HDL: 29.8 mg/dL — AB (ref >39.00)
LDL Cholesterol: 85 mg/dL (ref 0–99)
NonHDL: 102.16
TRIGLYCERIDES: 88 mg/dL (ref 0.0–149.0)
Total CHOL/HDL Ratio: 4
VLDL: 17.6 mg/dL (ref 0.0–40.0)

## 2015-09-07 LAB — VITAMIN D 25 HYDROXY (VIT D DEFICIENCY, FRACTURES): VITD: 16.1 ng/mL — ABNORMAL LOW (ref 30.00–100.00)

## 2015-09-07 LAB — RHEUMATOID FACTOR: RHEUMATOID FACTOR: 20 [IU]/mL — AB (ref <=14)

## 2015-09-07 LAB — MICROALBUMIN / CREATININE URINE RATIO
CREATININE, U: 377.9 mg/dL
MICROALB/CREAT RATIO: 2.1 mg/g (ref 0.0–30.0)
Microalb, Ur: 8.1 mg/dL — ABNORMAL HIGH (ref 0.0–1.9)

## 2015-09-07 LAB — TSH: TSH: 1.42 u[IU]/mL (ref 0.35–4.50)

## 2015-09-07 LAB — HEMOGLOBIN A1C: HEMOGLOBIN A1C: 12.4 % — AB (ref 4.6–6.5)

## 2015-09-07 MED ORDER — NIFEDIPINE ER OSMOTIC RELEASE 60 MG PO TB24: 60.0000 mg | ORAL_TABLET | Freq: Every day | ORAL | Status: DC

## 2015-09-07 MED ORDER — BYDUREON 2 MG ~~LOC~~ PEN: PEN_INJECTOR | SUBCUTANEOUS | Status: DC

## 2015-09-07 MED ORDER — COLESEVELAM HCL 625 MG PO TABS: ORAL_TABLET | ORAL | Status: DC

## 2015-09-07 MED ORDER — INSULIN LISPRO 100 UNIT/ML (KWIKPEN): 10.0000 [IU] | PEN_INJECTOR | SUBCUTANEOUS | Status: DC | PRN

## 2015-09-07 MED ORDER — INSULIN ISOPHANE HUMAN 100 UNIT/ML KWIKPEN: PEN_INJECTOR | SUBCUTANEOUS | Status: DC

## 2015-09-07 NOTE — Progress Notes (Signed)
Pre visit review using our clinic review tool, if applicable. No additional management support is needed unless otherwise documented below in the visit note. 

## 2015-09-07 NOTE — Patient Instructions (Signed)

## 2015-09-07 NOTE — Progress Notes (Signed)
Patient ID: Kathleen Jordan, female    DOB: [birth year]  Age: 45 y.o. MRN: [id number]    Subjective:  Subjective HPI Kathleen Jordan presents for f/u cholesterol, and dm.   She does see endo for dm.    HPI HYPERTENSION  Blood pressure range-not checking  Chest pain- no      Dyspnea- no Lightheadedness- no   Edema- no Other side effects - no   Medication compliance: good Low salt diet- yes  DIABETES  Blood Sugar ranges-high but better-- sees endo  Polyuria- no New Visual problems- no Hypoglycemic symptoms- no Other side effects-no Medication compliance - good Last eye exam- 2016/2017 Foot exam- today  HYPERLIPIDEMIA  Medication compliance- good RUQ pain- no  Muscle aches- yes Other side effects-no    Review of Systems  Constitutional: Negative for diaphoresis, appetite change, fatigue and unexpected weight change.  Eyes: Negative for pain, redness and visual disturbance.  Respiratory: Negative for cough, chest tightness, shortness of breath and wheezing.   Cardiovascular: Negative for chest pain, palpitations and leg swelling.  Endocrine: Negative for cold intolerance, heat intolerance, polydipsia, polyphagia and polyuria.  Genitourinary: Negative for dysuria, frequency and difficulty urinating.  Musculoskeletal: Positive for myalgias and arthralgias.  Neurological: Negative for dizziness, light-headedness, numbness and headaches.    History Past Medical History  Diagnosis Date  . Hypertension   . Hyperlipidemia   . PCOS (polycystic ovarian syndrome)   . Allergic rhinitis   . Obesity   . Hx of varicella   . Hypopotassemia   . Diabetes mellitus     insulin  . Sleep apnea   . Difficult intubation     pt states with breast reduction surgery MD's had difficulty  . Postpartum care following vaginal delivery 11/13/2012  . Postpartum care following vaginal delivery (8/15) 11/13/2012    She has past surgical history that includes Breast reduction  surgery (07-2003) and invitro fertilization (11/16/2010).   Her family history includes Allergies in her sister; Arthritis in her maternal grandmother and mother; Asthma in her sister; Cancer in her maternal grandfather and maternal grandmother; Diabetes in her brother, maternal grandmother, and mother; Heart disease (age of onset: 6358) in her mother; Hypertension in her brother, maternal grandmother, and mother; Stroke in her maternal grandfather.She reports that she has never smoked. She has never used smokeless tobacco. She reports that she does not drink alcohol or use illicit drugs.  Current Outpatient Prescriptions on File Prior to Visit  Medication Sig Dispense Refill  . B-D UF III MINI PEN NEEDLES 31G X 5 MM MISC   6  . cetirizine (ZYRTEC) 10 MG tablet Take 1 tablet (10 mg total) by mouth daily. 30 tablet 11  . fluticasone (FLONASE) 50 MCG/ACT nasal spray SPRAY TWICE IN EACH NOSTRIL ONCE DAILY AS NEEDED 16 g 5  . hyoscyamine (LEVBID) 0.375 MG 12 hr tablet Take 1 tablet (0.375 mg total) by mouth 2 (two) times daily. 180 tablet 1   No current facility-administered medications on file prior to visit.     Objective:  Objective Physical Exam  Constitutional: She is oriented to person, place, and time. She appears well-developed and well-nourished.  HENT:  Head: Normocephalic and atraumatic.  Eyes: Conjunctivae and EOM are normal.  Neck: Normal range of motion. Neck supple. No JVD present. Carotid bruit is not present. No thyromegaly present.  Cardiovascular: Normal rate, regular rhythm and normal heart sounds.   No murmur heard. Pulmonary/Chest: Effort normal and breath sounds normal. No respiratory distress. She  has no wheezes. She has no rales. She exhibits no tenderness.  Musculoskeletal: She exhibits no edema.  Neurological: She is alert and oriented to person, place, and time.  Psychiatric: She has a normal mood and affect.  Nursing note and vitals reviewed. Sensory exam of the foot  is normal, tested with the monofilament. Good pulses, no lesions or ulcers, good peripheral pulses.  BP 124/70 mmHg  Pulse 94  Temp(Src) 98.6 F (37 C) (Oral)  Ht 5\' 8"  (1.727 m)  Wt 246 lb (111.585 kg)  BMI 37.41 kg/m2  SpO2 96%  LMP 08/18/2015 Wt Readings from Last 3 Encounters:  09/07/15 246 lb (111.585 kg)  06/04/15 241 lb (109.317 kg)  04/07/15 246 lb 9.6 oz (111.857 kg)     Lab Results  Component Value Date   WBC 4.9 06/04/2015   HGB 13.3 06/04/2015   HCT 38.7 06/04/2015   PLT 191 06/04/2015   GLUCOSE 199* 06/04/2015   CHOL 202* 06/20/2014   TRIG 122.0 06/20/2014   HDL 49.00 06/20/2014   LDLDIRECT 139.3 11/20/2010   LDLCALC 129* 06/20/2014   ALT 227* 06/20/2014   AST 204* 06/20/2014   NA 134* 06/04/2015   K 3.1* 06/04/2015   CL 103 06/04/2015   CREATININE 0.72 06/04/2015   BUN 8 06/04/2015   CO2 20* 06/04/2015   TSH 1.10 06/20/2014   HGBA1C 9.0 06/05/2015   MICROALBUR 2.9* 06/20/2014    Dg Chest 2 View  06/04/2015  CLINICAL DATA:  Sick for 4 days with fever and cough today, nonsmoker EXAM: CHEST  2 VIEW COMPARISON:  08/24/2010 FINDINGS: The heart size and vascular pattern are normal. Left lung is clear and there are no pleural effusions. On the right, there is mild opacity move in the inferior perihilar region. IMPRESSION: Subtle infiltrate right perihilar region could represent atelectasis or developing pneumonia. Suggest radiographic followup. Electronically Signed   By: Esperanza Heir M.D.   On: 06/04/2015 07:23     Assessment & Plan:  Plan I have discontinued Kathleen Jordan's colesevelam, amoxicillin-clavulanate, and doxycycline. I have changed her WELCHOL to colesevelam and NIFEDICAL XL to NIFEdipine. I have also changed her insulin lispro and BYDUREON. Additionally, I am having her maintain her fluticasone, B-D UF III MINI PEN NEEDLES, hyoscyamine, cetirizine, and Insulin NPH (Human) (Isophane).  Meds ordered this encounter  Medications  . colesevelam  (WELCHOL) 625 MG tablet    Sig: TAKE 3 TABLETS BY MOUTH TWICE DAILY WITH A MEAL    Dispense:  180 tablet    Refill:  1  . NIFEdipine (NIFEDICAL XL) 60 MG 24 hr tablet    Sig: Take 1 tablet (60 mg total) by mouth daily.    Dispense:  90 tablet    Refill:  1  . Insulin NPH, Human,, Isophane, (HUMULIN N PEN) 100 UNIT/ML Kiwkpen    Sig: 25 UNITS IN THE MORNING AND 35 UNITS IN THE EVENING    Dispense:  15 mL  . insulin lispro (HUMALOG KWIKPEN) 100 UNIT/ML KiwkPen    Sig: Inject 0.1 mLs (10 Units total) into the skin as needed. SLIDING SCALE    Dispense:  15 mL  . BYDUREON 2 MG PEN    Sig: Per endo    Dispense:  1 each    Problem List Items Addressed This Visit    Essential hypertension   Relevant Medications   colesevelam (WELCHOL) 625 MG tablet   NIFEdipine (NIFEDICAL XL) 60 MG 24 hr tablet   IBS (irritable bowel syndrome)  Per GI Pt needs to get stool tests done and f/u Dr Loreta Ave       Other Visit Diagnoses    Uncontrolled type 2 diabetes mellitus with complication, without long-term current use of insulin (HCC)    -  Primary    Relevant Medications    colesevelam (WELCHOL) 625 MG tablet    Insulin NPH, Human,, Isophane, (HUMULIN N PEN) 100 UNIT/ML Kiwkpen    insulin lispro (HUMALOG KWIKPEN) 100 UNIT/ML KiwkPen    BYDUREON 2 MG PEN    Other Relevant Orders    Comprehensive metabolic panel    Hemoglobin A1c    Lipid panel    POCT urinalysis dipstick    Microalbumin / creatinine urine ratio    Hyperlipidemia        Relevant Medications    colesevelam (WELCHOL) 625 MG tablet    NIFEdipine (NIFEDICAL XL) 60 MG 24 hr tablet    Other Relevant Orders    Comprehensive metabolic panel    Hemoglobin A1c    Lipid panel    POCT urinalysis dipstick    Microalbumin / creatinine urine ratio    Joint pain        Relevant Orders    TSH    Rheumatoid factor    ANA    Vitamin D (25 hydroxy)       Follow-up: Return in about 6 months (around 03/08/2016), or if symptoms worsen or  fail to improve, for hypertension, hyperlipidemia, annual exam, fasting.  [name], DO

## 2015-09-07 NOTE — Assessment & Plan Note (Signed)
Per GI Pt needs to get stool tests done and f/u Dr Loreta AveMann

## 2015-09-08 LAB — ANTI-NUCLEAR AB-TITER (ANA TITER)

## 2015-09-08 LAB — ANA: Anti Nuclear Antibody(ANA): POSITIVE — AB

## 2015-10-09 ENCOUNTER — Other Ambulatory Visit: Payer: Self-pay | Admitting: Family Medicine

## 2015-10-11 ENCOUNTER — Telehealth: Payer: Self-pay | Admitting: *Deleted

## 2015-10-11 NOTE — Telephone Encounter (Signed)
Pt signed release of information received via fax from [facility]. They are requesting the most recent sleep study for pt. Since Dr. Zola ButtonLowne-Chase was the ordering provider, I printed sleep study and faxed to [facility] successfully at [phone]. JG//CMA

## 2015-11-06 ENCOUNTER — Other Ambulatory Visit: Payer: Self-pay | Admitting: Family Medicine

## 2015-11-20 ENCOUNTER — Other Ambulatory Visit: Payer: Self-pay | Admitting: Family Medicine

## 2015-11-28 ENCOUNTER — Ambulatory Visit (INDEPENDENT_AMBULATORY_CARE_PROVIDER_SITE_OTHER): Payer: Managed Care, Other (non HMO) | Admitting: Neurology

## 2015-11-28 ENCOUNTER — Encounter: Payer: Self-pay | Admitting: Neurology

## 2015-11-28 VITALS — BP 132/70 | HR 82 | Resp 16 | Ht 68.0 in | Wt 248.0 lb

## 2015-11-28 DIAGNOSIS — G2581 Restless legs syndrome: Secondary | ICD-10-CM

## 2015-11-28 DIAGNOSIS — G4710 Hypersomnia, unspecified: Secondary | ICD-10-CM

## 2015-11-28 DIAGNOSIS — G4733 Obstructive sleep apnea (adult) (pediatric): Secondary | ICD-10-CM | POA: Diagnosis not present

## 2015-11-28 DIAGNOSIS — R351 Nocturia: Secondary | ICD-10-CM

## 2015-11-28 DIAGNOSIS — E669 Obesity, unspecified: Secondary | ICD-10-CM | POA: Diagnosis not present

## 2015-11-28 NOTE — Progress Notes (Signed)
Subjective:    Patient ID: Kathleen Jordan is a 45 y.o. female.  HPI     Huston Foley, MD, PhD [facility] [address] [address] [place], [place] [address]  Dear Dr. Mcneil Sober,   I saw your patient, Kathleen Jordan, upon your kind request in my neurologic clinic today for initial consultation of her sleep disorder, in particular evaluation for obstructive sleep apnea. The patient is unaccompanied today. As you know, Kathleen Jordan is a 45 year old right handed woman with an underlying medical history of hypertension, hyperlipidemia, PCOS, allergic rhinitis, obesity, and type 2 diabetes, who was previously diagnosed with obstructive sleep apnea over 10 years ago. Prior sleep study results were reviewed by me today. She had a sleep study at [facility] on 08/03/2008, interpreted by Dr. Shelle Iron. Sleep efficiency was 62%, sleep latency was 57 minutes, REM latency was 54 minutes. She had a decreased percentage of slow-wave sleep and an increased percentage of stage I and stage II sleep, REM percentage was 18%, total AHI 46 per hour, oxyhemoglobin desaturation nadir 89%.  I reviewed your office records from 10/10/2015, which you kindly included.  She was placed on CPAP therapy. She no longer uses it. She quit using CPAP years ago. Her Epworth sleepiness score is 10 out of 24 today, fatigue score is 55 out of 63. She lives in home with her husband and son. She is a nonsmoker, she drinks alcohol occasionally, maybe once a month, she drinks caffeine occasionally, maybe twice a week. She does not typically nap. Her MGM had OSA, her mother has OSA.  She goes to bed late, between 11 PM and 1 AM. She has rare morning headaches and nocturia about 2-3 times per night. She endorses RLS symptoms , And these can be bothersome to her, it helps to move and stretch her legs at night. Wake time is around 7:30 AM as she has to get her 3-year-old son ready for daycare.  She works full-time as a Personnel officer. Sometimes she does not get home until 8:30 PM. Her husband works third shift. She would be willing to try CPAP again this time around. She tried it for about 3 months some 10 years ago. She would be interested in trying a dental appliance this time around as well. She has thyroid nodules, is monitored for this, she has not always well controlled diabetes, was diagnosed in 2011, denies any neuropathy issues, latest A1c around 11 per her report. She sees Dr. Talmage Nap for endocrinology. She has had difficulty losing weight. She probably gained weight since her last sleep study.  Her Past Medical History Is Significant For: Past Medical History:  Diagnosis Date  . Allergic rhinitis   . Diabetes mellitus    insulin  . Difficult intubation    pt states with breast reduction surgery MD's had difficulty  . Hx of varicella   . Hyperlipidemia   . Hypertension   . Hypopotassemia   . Obesity   . PCOS (polycystic ovarian syndrome)   . Postpartum care following vaginal delivery 11/13/2012  . Postpartum care following vaginal delivery (8/15) 11/13/2012  . Sleep apnea     Her Past Surgical History Is Significant For: Past Surgical History:  Procedure Laterality Date  . BREAST REDUCTION SURGERY  07-2003  . invitro fertilization  11/16/2010   [facility]    Her Family History Is Significant For: Family History  Problem Relation Age of Onset  . Arthritis Mother   .  Diabetes Mother   . Hypertension Mother   . Heart disease Mother 80    MI  . Stroke Mother   . Diabetes Brother   . Hypertension Brother   . Arthritis Maternal Grandmother   . Diabetes Maternal Grandmother   . Hypertension Maternal Grandmother   . Cancer Maternal Grandmother   . Stroke Maternal Grandfather   . Cancer Maternal Grandfather   . Asthma Sister   . Allergies Sister     Her Social History Is Significant For: Social History   Social History  . Marital status: Married     Spouse name: Caryn Bee  . Number of children: 1  . Years of education: Masters   Occupational History  . 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 Smoking status: Never Smoker  . Smokeless tobacco: Never Used  . Alcohol use Yes  . Drug use: No  . Sexual activity: Yes    Partners: Male   Other Topics Concern  . None   Social History Narrative   Drinks caffeine 2x a week     Her Allergies Are:  No Known Allergies:   Her Current Medications Are:  Outpatient Encounter Prescriptions as of 11/28/2015  Medication Sig  . B-D UF III MINI PEN NEEDLES 31G X 5 MM MISC   . BYDUREON 2 MG PEN Per endo  . cetirizine (ZYRTEC) 10 MG tablet Take 1 tablet (10 mg total) by mouth daily.  . colesevelam (WELCHOL) 625 MG tablet TAKE 3 TABLETS BY MOUTH TWICE DAILY WITH A MEAL  . fluticasone (FLONASE) 50 MCG/ACT nasal spray SPRAY TWICE IN EACH NOSTRIL ONCE DAILY AS NEEDED  . hyoscyamine (LEVBID) 0.375 MG 12 hr tablet TAKE 1 TABLET BY MOUTH TWICE DAILY  . insulin lispro (HUMALOG KWIKPEN) 100 UNIT/ML KiwkPen Inject 0.1 mLs (10 Units total) into the skin as needed. SLIDING SCALE  . Insulin NPH, Human,, Isophane, (HUMULIN N PEN) 100 UNIT/ML Kiwkpen 25 UNITS IN THE MORNING AND 35 UNITS IN THE EVENING  . NIFEDICAL XL 60 MG 24 hr tablet TAKE 1 TABLET BY MOUTH DAILY  . WELCHOL 625 MG tablet TAKE 3 TABLETS BY MOUTH TWICE DAILY WITH A MEAL   No facility-administered encounter medications on file as of 11/28/2015.   :  Review of Systems:  Out of a complete 14 point review of systems, all are reviewed and negative with the exception of these symptoms as listed below:  Review of Systems  Neurological:       Patient had sleep study about 10 years ago and was placed on CPAP. Patient has not used CPAP in years.   Patient has trouble falling and staying asleep, snoring, witnessed apnea, wakes up feeling tired, morning headaches, daytime  tiredness.    Epworth Sleepiness Scale 0= would never doze 1= slight chance of dozing 2= moderate chance of dozing 3= high chance of dozing  Sitting and reading:3 Watching TV:3 Sitting inactive in a public place (ex. Theater or meeting):2 As a passenger in a car for an hour without a break:0 Lying down to rest in the afternoon:2 Sitting and talking to someone:0 Sitting quietly after lunch (no alcohol):0 In a car, while stopped in traffic:0 Total:10   Objective:  Neurologic Exam  Physical Exam Physical Examination:   Vitals:   11/28/15 1541  BP: 132/70  Pulse: 82  Resp: 16    General Examination: The patient is a very pleasant 44  y.o. female in no acute distress. She appears well-developed and well-nourished and well groomed.   HEENT: Normocephalic, atraumatic, pupils are equal, round and reactive to light and accommodation. Funduscopic exam is normal with sharp disc margins noted. Extraocular tracking is good without limitation to gaze excursion or nystagmus noted. Normal smooth pursuit is noted. Hearing is grossly intact. Tympanic membranes are clear bilaterally. Face is symmetric with normal facial animation and normal facial sensation. Speech is clear with no dysarthria noted. There is no hypophonia. There is no lip, neck/head, jaw or voice tremor. Neck is supple with full range of passive and active motion. There are no carotid bruits on auscultation. Oropharynx exam reveals: mild mouth dryness, good dental hygiene and moderate airway crowding, due to tonsils in place of about 2+ in size bilaterally, thicker soft palate, large uvula. Mallampati is class III. Tongue protrudes centrally and palate elevates symmetrically. neck circumference is 18 inches. She has a very mild overbite. Nasal inspection reveals no significant nasal mucosal bogginess but she does appear to have inferior turbinate hypertrophy and mild mucosal erythema is noted, no septal deviation to speak of.   Chest:  Clear to auscultation without wheezing, rhonchi or crackles noted.  Heart: S1+S2+0, regular and normal without murmurs, rubs or gallops noted.   Abdomen: Soft, non-tender and non-distended with normal bowel sounds appreciated on auscultation.  Extremities: There is no pitting edema in the distal lower extremities bilaterally. Pedal pulses are intact.  Skin: Warm and dry without trophic changes noted. There are no varicose veins.  Musculoskeletal: exam reveals no obvious joint deformities, tenderness or joint swelling or erythema.   Neurologically:  Mental status: The patient is awake, alert and oriented in all 4 spheres. Her immediate and remote memory, attention, language skills and fund of knowledge are appropriate. There is no evidence of aphasia, agnosia, apraxia or anomia. Speech is clear with normal prosody and enunciation. Thought process is linear. Mood is normal and affect is normal.  Cranial nerves II - XII are as described above under HEENT exam. In addition: shoulder shrug is normal with equal shoulder height noted. Motor exam: Normal bulk, strength and tone is noted. There is no drift, tremor or rebound. Romberg is negative. Reflexes are 2+ throughout. Babinski: Toes are flexor bilaterally. Fine motor skills and coordination: intact with normal finger taps, normal hand movements, normal rapid alternating patting, normal foot taps and normal foot agility.  Cerebellar testing: No dysmetria or intention tremor on finger to nose testing. Heel to shin is unremarkable bilaterally. There is no truncal or gait ataxia.  Sensory exam: intact to light touch, pinprick, vibration, temperature sense in the upper and lower extremities.  Gait, station and balance: She stands easily. No veering to one side is noted. No leaning to one side is noted. Posture is age-appropriate and stance is narrow based. Gait shows normal stride length and normal pace. No problems turning are noted. Tandem walk is  unremarkable.   Assessment and Plan:    In summary, Kathleen Jordan is a very pleasant 44 y.o.-year old female with an underlying medical history of hypertension, hyperlipidemia, PCOS, allergic rhinitis, obesity, and type 2 diabetes, whose history and physical exam are in keeping with obstructive sleep apnea (OSA). Of note, she was diagnosed with severe obstructive sleep apnea by number of events in May 2007. In addition, she reports restless leg symptoms which are at times quite bothersome to her. We talked about potentially utilizing treatment for RLS down the Road, after her sleep  study. We will be on the lookout for PLMS. I had a long chat with the patient about my findings and the diagnosis of OSA, its prognosis and treatment options. We talked about medical treatments, surgical interventions and non-pharmacological approaches. I explained in particular the risks and ramifications of untreated moderate to severe OSA, especially with respect to developing cardiovascular disease down the Road, including congestive heart failure, difficult to treat hypertension, cardiac arrhythmias, or stroke. Even type 2 diabetes has, in part, been linked to untreated OSA. Symptoms of untreated OSA include daytime sleepiness, memory problems, mood irritability and mood disorder such as depression and anxiety, lack of energy, as well as recurrent headaches, especially morning headaches. We talked about trying to maintain a healthy lifestyle in general, as well as the importance of weight control. I encouraged the patient to eat healthy, exercise daily and keep well hydrated, to keep a scheduled bedtime and wake time routine, to not skip any meals and eat healthy snacks in between meals. I advised the patient not to drive when feeling sleepy. I recommended the following at this time: sleep study with potential positive airway pressure titration. (We will score hypopneas at 4% and split the sleep study into diagnostic  and treatment portion, if the estimated. 2 hour AHI is >15/h).   I explained the sleep test procedure to the patient and also outlined possible surgical and non-surgical treatment options of OSA, including the use of a custom-made dental device (which would require a referral to a specialist dentist or oral surgeon), upper airway surgical options, such as pillar implants, radiofrequency surgery, tongue base surgery, and UPPP (which would involve a referral to an ENT surgeon). Rarely, jaw surgery such as mandibular advancement may be considered.  I also explained the CPAP treatment option to the patient, who indicated that she would be willing to try CPAP if the need arises. I explained the importance of being compliant with PAP treatment, not only for insurance purposes but primarily to improve Her symptoms, and for the patient's long term health benefit, including to reduce Her cardiovascular risks. I answered all her questions today and the patient was in agreement. I would like to see her back after the sleep study is completed and encouraged her to call with any interim questions, concerns, problems or updates.   Thank you very much for allowing me to participate in the care of this nice patient. If I can be of any further assistance to you please do not hesitate to call me at [phone].  Sincerely,   Huston FoleySaima Anntonette Madewell, MD, PhD

## 2015-11-28 NOTE — Patient Instructions (Signed)

## 2016-02-24 ENCOUNTER — Other Ambulatory Visit: Payer: Self-pay | Admitting: Family Medicine

## 2016-03-18 ENCOUNTER — Other Ambulatory Visit: Payer: Self-pay | Admitting: Family Medicine

## 2016-03-31 ENCOUNTER — Emergency Department (HOSPITAL_BASED_OUTPATIENT_CLINIC_OR_DEPARTMENT_OTHER)
Admission: EM | Admit: 2016-03-31 | Discharge: 2016-03-31 | Disposition: A | Discharge status: Home / Self Care | Payer: Managed Care, Other (non HMO) | Attending: Emergency Medicine | Admitting: Emergency Medicine

## 2016-03-31 ENCOUNTER — Emergency Department (HOSPITAL_BASED_OUTPATIENT_CLINIC_OR_DEPARTMENT_OTHER): Payer: Managed Care, Other (non HMO)

## 2016-03-31 ENCOUNTER — Encounter (HOSPITAL_BASED_OUTPATIENT_CLINIC_OR_DEPARTMENT_OTHER): Payer: Self-pay | Admitting: *Deleted

## 2016-03-31 DIAGNOSIS — J029 Acute pharyngitis, unspecified: Secondary | ICD-10-CM | POA: Diagnosis present

## 2016-03-31 DIAGNOSIS — E119 Type 2 diabetes mellitus without complications: Secondary | ICD-10-CM | POA: Insufficient documentation

## 2016-03-31 DIAGNOSIS — I10 Essential (primary) hypertension: Secondary | ICD-10-CM | POA: Insufficient documentation

## 2016-03-31 DIAGNOSIS — Z794 Long term (current) use of insulin: Secondary | ICD-10-CM | POA: Diagnosis not present

## 2016-03-31 DIAGNOSIS — J0100 Acute maxillary sinusitis, unspecified: Secondary | ICD-10-CM | POA: Insufficient documentation

## 2016-03-31 MED ORDER — AMOXICILLIN 500 MG PO CAPS: 500.0000 mg | ORAL_CAPSULE | Freq: Three times a day (TID) | ORAL | 0 refills | Status: DC

## 2016-03-31 MED ORDER — BENZONATATE 100 MG PO CAPS: 100.0000 mg | ORAL_CAPSULE | Freq: Three times a day (TID) | ORAL | 0 refills | Status: DC

## 2016-03-31 NOTE — ED Triage Notes (Signed)
C/o cough for 1.5 weeks. States prod cough, green in color.  C/o sore throat for past 3 days. Denies any fevers. C/o bilateral ear pain. C/o frontal h/a. Has been taking otc musionex, nasal sprays without relief. Has also taken ibuprofen once a day with little relief.

## 2016-03-31 NOTE — Discharge Instructions (Signed)
Continue clairitin and flonase.  Use Blood Pressure safe OTC decongestant.   Rx for amoxicillin for 10 days, and cough suppressant

## 2016-03-31 NOTE — ED Provider Notes (Addendum)
[facility]-EMERGENCY DEPT Provider Note   CSN: [id number] Arrival date & time: 03/31/16  0532     History   Chief Complaint Chief Complaint  Patient presents with  . Sore Throat    HPI Kathleen Jordan is a 45 y.o. female. She presents with a cough. She's had a cough for the last 12 days. Didn't sleep last night secondary to this cough. Dry nonproductive. No fever.Complains of sinus pressure and dental pain. Symptoms for almost 2 weeks. No ear pain or pressure no fever. She did receive a flu shot.  HPI  Past Medical History:  Diagnosis Date  . Allergic rhinitis   . Diabetes mellitus    insulin  . Difficult intubation    pt states with breast reduction surgery MD's had difficulty  . Hx of varicella   . Hyperlipidemia   . Hypertension   . Hypopotassemia   . Obesity   . PCOS (polycystic ovarian syndrome)   . Postpartum care following vaginal delivery 11/13/2012  . Postpartum care following vaginal delivery (8/15) 11/13/2012  . Sleep apnea     Patient Active Problem List   Diagnosis Date Noted  . IBS (irritable bowel syndrome) 09/07/2015  . Knee pain, acute 06/01/2014  . Obesity (BMI 30-39.9) 06/15/2013  . Hypertension complicating pregnancy 11/13/2012  . Postpartum care following vaginal delivery (8/15) 11/13/2012  . OSA (obstructive sleep apnea) 12/06/2010  . Diabetes mellitus (HCC) 11/23/2010  . HEADACHE 12/08/2009  . POLYCYSTIC OVARIES 08/02/2009  . MORBID OBESITY 06/29/2009  . GOITER, SIMPLE 01/10/2009  . DYSPHAGIA UNSPECIFIED 01/10/2009  . ABSCESS, SKIN 11/10/2007  . HYPERLIPIDEMIA 05/21/2007  . SINUSITIS- ACUTE-NOS 05/21/2007  . Essential hypertension 09/18/2006    Past Surgical History:  Procedure Laterality Date  . BREAST REDUCTION SURGERY  07-2003  . [facility]    OB History    Gravida Para Term Preterm AB Living   3 1 1  0 2 1   SAB TAB Ectopic Multiple Live Births   2 0 0 0 1       Home Medications     Prior to Admission medications   Medication Sig Start Date End Date Taking? Authorizing Provider  amoxicillin (AMOXIL) 500 MG capsule Take 1 capsule (500 mg total) by mouth 3 (three) times daily. 03/31/16   Rolland PorterMark Mita Vallo, MD  B-D UF III MINI PEN NEEDLES 31G X 5 MM MISC  05/06/14   Historical Provider, MD  benzonatate (TESSALON) 100 MG capsule Take 1 capsule (100 mg total) by mouth every 8 (eight) hours. 03/31/16   Rolland PorterMark Alquan Morrish, MD  BYDUREON 2 MG PEN Per endo 09/07/15   Grayling CongressYvonne R Lowne Chase, DO  cetirizine (ZYRTEC) 10 MG tablet Take 1 tablet (10 mg total) by mouth daily. 08/30/15   Grayling CongressYvonne R Lowne Chase, DO  colesevelam (WELCHOL) 625 MG tablet TAKE 3 TABLETS BY MOUTH TWICE DAILY WITH A MEAL 09/07/15   Grayling CongressYvonne R Lowne Chase, DO  fluticasone [facility]FLONASE) 50 MCG/ACT nasal spray SPRAY TWICE IN EACH NOSTRIL ONCE DAILY AS NEEDED 04/14/14   Grayling CongressYvonne R Lowne Chase, DO  hyoscyamine (LEVBID) 0.375 MG 12 hr tablet TAKE 1 TABLET BY MOUTH TWICE DAILY 03/18/16   Grayling CongressYvonne R Lowne Chase, DO  insulin lispro (HUMALOG KWIKPEN) 100 UNIT/ML KiwkPen Inject 0.1 mLs (10 Units total) into the skin as needed. SLIDING SCALE 09/07/15   Grayling CongressYvonne R Lowne Chase, DO  Insulin NPH, Human,, Isophane, (HUMULIN N PEN) 100 UNIT/ML Kiwkpen 25 UNITS IN THE MORNING AND  35 UNITS IN THE EVENING 09/07/15   Grayling Congress Lowne Chase, DO  NIFEDICAL XL 60 MG 24 hr tablet TAKE 1 TABLET BY MOUTH DAILY 10/10/15   Lelon Perla Chase, DO  [facility] 625 MG tablet TAKE 3 TABLETS BY MOUTH TWICE DAILY WITH A MEAL 03/18/16   [name], DO    Family History Family History  Problem Relation Age of Onset  . Arthritis Mother   . Diabetes Mother   . Hypertension Mother   . Heart disease Mother 75    MI  . Stroke Mother   . Diabetes Brother   . Hypertension Brother   . Arthritis Maternal Grandmother   . Diabetes Maternal Grandmother   . Hypertension Maternal Grandmother   . Cancer Maternal Grandmother   . Stroke Maternal Grandfather   . Cancer Maternal Grandfather    . Asthma Sister   . Allergies Sister     Social History Social History  Substance Use Topics  . Smoking status: Never Smoker  . Smokeless tobacco: Never Used  . Alcohol use No     Allergies   Patient has no known allergies.   Review of Systems Review of Systems  Constitutional: Negative for appetite change, chills, diaphoresis, fatigue and fever.  HENT: Positive for congestion, sinus pain, sinus pressure and sore throat. Negative for mouth sores and trouble swallowing.   Eyes: Negative for visual disturbance.  Respiratory: Positive for cough. Negative for chest tightness, shortness of breath and wheezing.   Cardiovascular: Negative for chest pain.  Gastrointestinal: Negative for abdominal distention, abdominal pain, diarrhea, nausea and vomiting.  Endocrine: Negative for polydipsia, polyphagia and polyuria.  Genitourinary: Negative for dysuria, frequency and hematuria.  Musculoskeletal: Negative for gait problem.  Skin: Negative for color change, pallor and rash.  Neurological: Negative for dizziness, syncope, light-headedness and headaches.  Hematological: Does not bruise/bleed easily.  Psychiatric/Behavioral: Negative for behavioral problems and confusion.     Physical Exam Updated Vital Signs BP 146/88 (BP Location: Right Arm)   Pulse 82   Temp 98.2 F (36.8 C) (Oral)   Resp 16   Ht 5\' 8"  (1.727 m)   Wt 240 lb (108.9 kg)   LMP 03/20/2016   SpO2 99%   BMI 36.49 kg/m   Physical Exam  Constitutional: She is oriented to person, place, and time. She appears well-developed and well-nourished. No distress.  HENT:  Head: Normocephalic.  Nose: Nose normal.  Mouth/Throat: Oropharynx is clear and moist.  Eyes: Conjunctivae are normal. Pupils are equal, round, and reactive to light. No scleral icterus.  Neck: Normal range of motion. Neck supple. No thyromegaly present.  Cardiovascular: Normal rate and regular rhythm.  Exam reveals no gallop and no friction rub.   No  murmur heard. Pulmonary/Chest: Effort normal and breath sounds normal. No respiratory distress. She has no wheezes. She has no rales.  Abdominal: Soft. Bowel sounds are normal. She exhibits no distension. There is no tenderness. There is no rebound.  Musculoskeletal: Normal range of motion.  Neurological: She is alert and oriented to person, place, and time.  Skin: Skin is warm and dry. No rash noted.  Psychiatric: She has a normal mood and affect. Her behavior is normal.     ED Treatments / Results  Labs (all labs ordered are listed, but only abnormal results are displayed) Labs Reviewed - No data to display  EKG  EKG Interpretation None       Radiology No results found.  Procedures Procedures (including  critical care time)  Medications Ordered in ED Medications - No data to display   Initial Impression / Assessment and Plan / ED Course  I have reviewed the triage vital signs and the nursing notes.  Pertinent labs & imaging results that were available during my care of the patient were reviewed by me and considered in my medical decision making (see chart for details).  Clinical Course    Symptoms of upper respiratory infection for nearly 2 weeks now with sinus pain and pressure. Using Claritin) decongestant at home with minimal relief. Have asked her to continue these. We'll add Flonase. Amoxicillin 10 day course for possible sinusitis.  Final Clinical Impressions(s) / ED Diagnoses   Final diagnoses:  Acute maxillary sinusitis, recurrence not specified    New Prescriptions Discharge Medication List as of 03/31/2016  7:31 AM    START taking these medications   Details  amoxicillin (AMOXIL) 500 MG capsule Take 1 capsule (500 mg total) by mouth 3 (three) times daily., Starting Sun 03/31/2016, Print    benzonatate (TESSALON) 100 MG capsule Take 1 capsule (100 mg total) by mouth every 8 (eight) hours., Starting Sun 03/31/2016, Print         Rolland PorterMark Cephas Revard,  MD 03/31/16 [id number]    Rolland PorterMark Fabrizio Filip, MD 04/04/16 [phone]

## 2016-04-13 ENCOUNTER — Other Ambulatory Visit: Payer: Self-pay | Admitting: Family Medicine

## 2016-06-20 ENCOUNTER — Ambulatory Visit (INDEPENDENT_AMBULATORY_CARE_PROVIDER_SITE_OTHER): Payer: Managed Care, Other (non HMO) | Admitting: Family Medicine

## 2016-06-20 ENCOUNTER — Encounter: Payer: Self-pay | Admitting: Family Medicine

## 2016-06-20 ENCOUNTER — Telehealth: Payer: Self-pay | Admitting: Family Medicine

## 2016-06-20 VITALS — BP 102/58 | HR 90 | Temp 98.5°F | Resp 16 | Ht 68.0 in | Wt 254.2 lb

## 2016-06-20 DIAGNOSIS — Z23 Encounter for immunization: Secondary | ICD-10-CM

## 2016-06-20 DIAGNOSIS — I10 Essential (primary) hypertension: Secondary | ICD-10-CM

## 2016-06-20 DIAGNOSIS — Z0000 Encounter for general adult medical examination without abnormal findings: Secondary | ICD-10-CM

## 2016-06-20 DIAGNOSIS — E785 Hyperlipidemia, unspecified: Secondary | ICD-10-CM | POA: Diagnosis not present

## 2016-06-20 NOTE — Assessment & Plan Note (Signed)
ghm utd Check labs See AVS 

## 2016-06-20 NOTE — Progress Notes (Signed)
Pre visit review using our clinic review tool, if applicable. No additional management support is needed unless otherwise documented below in the visit note. 

## 2016-06-20 NOTE — Progress Notes (Signed)
Subjective:  I acted as a Neurosurgeonscribe for Dr. Delman KittenLowne Emmalou Hunger, LPN    Patient ID: Kathleen Jordan, female    DOB: 1970-12-27, 46 y.o.   MRN: [id number]  Chief Complaint  Patient presents with  . Annual Exam    Hypertension  This is a chronic problem. The current episode started more than 1 year ago. Pertinent negatives include no blurred vision, chest pain, headaches or palpitations.  Hyperlipidemia  This is a chronic problem. The current episode started more than 1 year ago. The problem is controlled. Pertinent negatives include no chest pain.    Patient is in today for cpe-- no complaints.   Patient Care Team: Donato SchultzYvonne R Lowne Chase, DO as PCP - General Dorisann FramesBindubal Balan, MD as Consulting Physician (Endocrinology) Kirkland HunArthur Stringer, MD as Consulting Physician (Obstetrics and Gynecology)   Past Medical History:  Diagnosis Date  . Allergic rhinitis   . Diabetes mellitus    insulin  . Difficult intubation    pt states with breast reduction surgery MD's had difficulty  . Hx of varicella   . Hyperlipidemia   . Hypertension   . Hypopotassemia   . Obesity   . PCOS (polycystic ovarian syndrome)   . Postpartum care following vaginal delivery 11/13/2012  . Postpartum care following vaginal delivery (8/15) 11/13/2012  . Sleep apnea     Past Surgical History:  Procedure Laterality Date  . BREAST REDUCTION SURGERY  07-2003  . [facility]    Family History  Problem Relation Age of Onset  . Arthritis Mother   . Diabetes Mother   . Hypertension Mother   . Heart disease Mother 3158    MI  . Stroke Mother   . Diabetes Brother   . Hypertension Brother   . Arthritis Maternal Grandmother   . Diabetes Maternal Grandmother   . Hypertension Maternal Grandmother   . Cancer Maternal Grandmother   . Stroke Maternal Grandfather   . Cancer Maternal Grandfather   . Asthma Sister   . Allergies Sister     Social History   Social History  . Marital  status: Married    Spouse name: [name]  . Number of children: 1  . Years of education: Masters   Occupational History  . 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 Smoking status: Never Smoker  . Smokeless tobacco: Never Used  . Alcohol use No  . Drug use: No  . Sexual activity: Yes    Partners: Male   Other Topics Concern  . Not on file   Social History Narrative   Drinks caffeine 2x a week     Outpatient Medications Prior to Visit  Medication Sig Dispense Refill  . B-D UF III MINI PEN NEEDLES 31G X 5 MM MISC   6  . benzonatate (TESSALON) 100 MG capsule Take 1 capsule (100 mg total) by mouth every 8 (eight) hours. 21 capsule 0  . BYDUREON 2 MG PEN Per endo 1 each   . cetirizine (ZYRTEC) 10 MG tablet Take 1 tablet (10 mg total) by mouth daily. 30 tablet 11  . fluticasone (FLONASE) 50 MCG/ACT nasal spray SPRAY TWICE IN EACH NOSTRIL ONCE DAILY AS NEEDED 16 g 5  . hyoscyamine (LEVBID) 0.375 MG 12 hr tablet TAKE 1 TABLET BY MOUTH TWICE DAILY 60 tablet 0  . insulin lispro (HUMALOG KWIKPEN) 100 UNIT/ML KiwkPen Inject 0.1 mLs (  10 Units total) into the skin as needed. SLIDING SCALE 15 mL   . Insulin NPH, Human,, Isophane, (HUMULIN N PEN) 100 UNIT/ML Kiwkpen 25 UNITS IN THE MORNING AND 35 UNITS IN THE EVENING 15 mL   . NIFEdipine (PROCARDIA XL/ADALAT-CC) 60 MG 24 hr tablet TAKE 1 TABLET BY MOUTH DAILY 90 tablet 0  . colesevelam (WELCHOL) 625 MG tablet TAKE 3 TABLETS BY MOUTH TWICE DAILY WITH A MEAL 180 tablet 1  . WELCHOL 625 MG tablet TAKE 3 TABLETS BY MOUTH TWICE DAILY WITH A MEAL 180 tablet 0  . amoxicillin (AMOXIL) 500 MG capsule Take 1 capsule (500 mg total) by mouth 3 (three) times daily. (Patient not taking: Reported on 06/20/2016) 30 capsule 0   No facility-administered medications prior to visit.     No Known Allergies  Review of Systems  Constitutional: Negative for fever.  HENT: Negative for congestion.     Eyes: Negative for blurred vision.  Respiratory: Negative for cough.   Cardiovascular: Negative for chest pain and palpitations.  Gastrointestinal: Negative for vomiting.  Musculoskeletal: Negative for back pain.  Skin: Negative for rash.  Neurological: Negative for loss of consciousness and headaches.       Objective:    Physical Exam  Constitutional: She is oriented to person, place, and time. She appears well-developed and well-nourished. No distress.  HENT:  Head: Normocephalic and atraumatic.  Eyes: Conjunctivae are normal. Pupils are equal, round, and reactive to light.  Neck: Normal range of motion. No thyromegaly present.  Cardiovascular: Normal rate and regular rhythm.   Pulmonary/Chest: Effort normal and breath sounds normal. She has no wheezes.  Abdominal: Soft. Bowel sounds are normal. There is no tenderness.  Musculoskeletal: Normal range of motion. She exhibits no edema or deformity.  Neurological: She is alert and oriented to person, place, and time.  Skin: Skin is warm and dry. She is not diaphoretic.  Psychiatric: She has a normal mood and affect.    BP (!) 102/58 (BP Location: Left Arm, Patient Position: Sitting, Cuff Size: Large)   Pulse 90   Temp 98.5 F (36.9 C) (Oral)   Resp 16   Ht 5\' 8"  (1.727 m)   Wt 254 lb 3.2 oz (115.3 kg)   LMP 06/02/2016 (Exact Date)   SpO2 95%   BMI 38.65 kg/m  Wt Readings from Last 3 Encounters:  06/20/16 254 lb 3.2 oz (115.3 kg)  03/31/16 240 lb (108.9 kg)  11/28/15 248 lb (112.5 kg)   BP Readings from Last 3 Encounters:  06/20/16 (!) 102/58  03/31/16 146/88  11/28/15 132/70     Immunization History  Administered Date(s) Administered  . Influenza Split 01/10/2011, 01/31/2012  . Influenza Whole 01/01/2010  . Influenza,inj,Quad PF,36+ Mos 12/24/2012, 02/28/2014, 06/20/2016  . PPD Test 07/25/2010, 06/20/2014  . Pneumococcal Polysaccharide-23 02/28/2014  . Td 04/01/2000  . Tdap 11/02/2012    Health Maintenance   Topic Date Due  . INFLUENZA VACCINE  10/31/2015  . HEMOGLOBIN A1C  03/08/2016  . OPHTHALMOLOGY EXAM  05/16/2016  . URINE MICROALBUMIN  09/06/2016  . PAP SMEAR  12/28/2016  . FOOT EXAM  06/20/2017  . PNEUMOCOCCAL POLYSACCHARIDE VACCINE (2) 03/01/2019  . TETANUS/TDAP  11/03/2022  . HIV Screening  Completed    Lab Results  Component Value Date   WBC 4.9 06/04/2015   HGB 13.3 06/04/2015   HCT 38.7 06/04/2015   PLT 191 06/04/2015   GLUCOSE 238 (H) 09/07/2015   CHOL 132 09/07/2015   TRIG 88.0 09/07/2015  HDL 29.80 (L) 09/07/2015   LDLDIRECT 139.3 11/20/2010   LDLCALC 85 09/07/2015   ALT 133 (H) 09/07/2015   AST 69 (H) 09/07/2015   NA 137 09/07/2015   K 3.3 (L) 09/07/2015   CL 101 09/07/2015   CREATININE 0.68 09/07/2015   BUN 10 09/07/2015   CO2 27 09/07/2015   TSH 1.42 09/07/2015   HGBA1C 12.4 (H) 09/07/2015   MICROALBUR 8.1 (H) 09/07/2015    Lab Results  Component Value Date   TSH 1.42 09/07/2015   Lab Results  Component Value Date   WBC 4.9 06/04/2015   HGB 13.3 06/04/2015   HCT 38.7 06/04/2015   MCV 87.2 06/04/2015   PLT 191 06/04/2015   Lab Results  Component Value Date   NA 137 09/07/2015   K 3.3 (L) 09/07/2015   CO2 27 09/07/2015   GLUCOSE 238 (H) 09/07/2015   BUN 10 09/07/2015   CREATININE 0.68 09/07/2015   BILITOT 0.4 09/07/2015   ALKPHOS 124 (H) 09/07/2015   AST 69 (H) 09/07/2015   ALT 133 (H) 09/07/2015   PROT 7.6 09/07/2015   ALBUMIN 4.1 09/07/2015   CALCIUM 9.3 09/07/2015   ANIONGAP 11 06/04/2015   GFR 121.62 09/07/2015   Lab Results  Component Value Date   CHOL 132 09/07/2015   Lab Results  Component Value Date   HDL 29.80 (L) 09/07/2015   Lab Results  Component Value Date   LDLCALC 85 09/07/2015   Lab Results  Component Value Date   TRIG 88.0 09/07/2015   Lab Results  Component Value Date   CHOLHDL 4 09/07/2015   Lab Results  Component Value Date   HGBA1C 12.4 (H) 09/07/2015         Assessment & Plan:   Problem  List Items Addressed This Visit      Unprioritized   Essential hypertension   Relevant Orders   CBC   Comprehensive metabolic panel   Preventative health care - Primary    ghm utd Check labs See AVS      Relevant Orders   POCT Urinalysis Dipstick (Automated)   TSH    Other Visit Diagnoses    Hyperlipidemia, unspecified hyperlipidemia type       Relevant Orders   Lipid panel   Needs flu shot       Relevant Orders   Flu Vaccine QUAD 36+ mos PF IM (Fluarix & Fluzone Quad PF) (Completed)      I have discontinued Ms. Lara's colesevelam, WELCHOL, and amoxicillin. I am also having her maintain her fluticasone, B-D UF III MINI PEN NEEDLES, cetirizine, Insulin NPH (Human) (Isophane), insulin lispro, BYDUREON, hyoscyamine, benzonatate, and NIFEdipine.  No orders of the defined types were placed in this encounter.   CMA served as Neurosurgeon during this visit. History, Physical and Plan performed by medical provider. Documentation and orders reviewed and attested to.  [name], DO   Patient ID: Kathleen Jordan, female   DOB: 11/25/1970, 46 y.o.   MRN: [id number]

## 2016-06-20 NOTE — Patient Instructions (Signed)
Preventive Care for Adults A healthy lifestyle and preventive care can promote health and wellness. Preventive health guidelines for women include the following key practices.  A routine yearly physical is a good way to check with your health care provider about your health and preventive screening. It is a chance to share any concerns and updates on your health and to receive a thorough exam.  Visit your dentist for a routine exam and preventive care every 6 months. Brush your teeth twice a day and floss once a day. Good oral hygiene prevents tooth decay and gum disease.  The frequency of eye exams is based on your age, health, family medical history, use of contact lenses, and other factors. Follow your health care provider's recommendations for frequency of eye exams.  Eat a healthy diet. Foods like vegetables, fruits, whole grains, low-fat dairy products, and lean protein foods contain the nutrients you need without too many calories. Decrease your intake of foods high in solid fats, added sugars, and salt. Eat the right amount of calories for you.Get information about a proper diet from your health care provider, if necessary.  Regular physical exercise is one of the most important things you can do for your health. Most adults should get at least 150 minutes of moderate-intensity exercise (any activity that increases your heart rate and causes you to sweat) each week. In addition, most adults need muscle-strengthening exercises on 2 or more days a week.  Maintain a healthy weight. The body mass index (BMI) is a screening tool to identify possible weight problems. It provides an estimate of body fat based on height and weight. Your health care provider can find your BMI and can help you achieve or maintain a healthy weight.For adults 20 years and older:  A BMI below 18.5 is considered underweight.  A BMI of 18.5 to 24.9 is normal.  A BMI of 25 to 29.9 is considered overweight.  A BMI of  30 and above is considered obese.  Maintain normal blood lipids and cholesterol levels by exercising and minimizing your intake of saturated fat. Eat a balanced diet with plenty of fruit and vegetables. Blood tests for lipids and cholesterol should begin at age 76 and be repeated every 5 years. If your lipid or cholesterol levels are high, you are over 50, or you are at high risk for heart disease, you may need your cholesterol levels checked more frequently.Ongoing high lipid and cholesterol levels should be treated with medicines if diet and exercise are not working.  If you smoke, find out from your health care provider how to quit. If you do not use tobacco, do not start.  Lung cancer screening is recommended for adults aged 22-80 years who are at high risk for developing lung cancer because of a history of smoking. A yearly low-dose CT scan of the lungs is recommended for people who have at least a 30-pack-year history of smoking and are a current smoker or have quit within the past 15 years. A pack year of smoking is smoking an average of 1 pack of cigarettes a day for 1 year (for example: 1 pack a day for 30 years or 2 packs a day for 15 years). Yearly screening should continue until the smoker has stopped smoking for at least 15 years. Yearly screening should be stopped for people who develop a health problem that would prevent them from having lung cancer treatment.  If you are pregnant, do not drink alcohol. If you are breastfeeding,  be very cautious about drinking alcohol. If you are not pregnant and choose to drink alcohol, do not have more than 1 drink per day. One drink is considered to be 12 ounces (355 mL) of beer, 5 ounces (148 mL) of wine, or 1.5 ounces (44 mL) of liquor.  Avoid use of street drugs. Do not share needles with anyone. Ask for help if you need support or instructions about stopping the use of drugs.  High blood pressure causes heart disease and increases the risk of  stroke. Your blood pressure should be checked at least every 1 to 2 years. Ongoing high blood pressure should be treated with medicines if weight loss and exercise do not work.  If you are 75-52 years old, ask your health care provider if you should take aspirin to prevent strokes.  Diabetes screening involves taking a blood sample to check your fasting blood sugar level. This should be done once every 3 years, after age 15, if you are within normal weight and without risk factors for diabetes. Testing should be considered at a younger age or be carried out more frequently if you are overweight and have at least 1 risk factor for diabetes.  Breast cancer screening is essential preventive care for women. You should practice "breast self-awareness." This means understanding the normal appearance and feel of your breasts and may include breast self-examination. Any changes detected, no matter how small, should be reported to a health care provider. Women in their 58s and 30s should have a clinical breast exam (CBE) by a health care provider as part of a regular health exam every 1 to 3 years. After age 16, women should have a CBE every year. Starting at age 53, women should consider having a mammogram (breast X-ray test) every year. Women who have a family history of breast cancer should talk to their health care provider about genetic screening. Women at a high risk of breast cancer should talk to their health care providers about having an MRI and a mammogram every year.  Breast cancer gene (BRCA)-related cancer risk assessment is recommended for women who have family members with BRCA-related cancers. BRCA-related cancers include breast, ovarian, tubal, and peritoneal cancers. Having family members with these cancers may be associated with an increased risk for harmful changes (mutations) in the breast cancer genes BRCA1 and BRCA2. Results of the assessment will determine the need for genetic counseling and  BRCA1 and BRCA2 testing.  Routine pelvic exams to screen for cancer are no longer recommended for nonpregnant women who are considered low risk for cancer of the pelvic organs (ovaries, uterus, and vagina) and who do not have symptoms. Ask your health care provider if a screening pelvic exam is right for you.  If you have had past treatment for cervical cancer or a condition that could lead to cancer, you need Pap tests and screening for cancer for at least 20 years after your treatment. If Pap tests have been discontinued, your risk factors (such as having a new sexual partner) need to be reassessed to determine if screening should be resumed. Some women have medical problems that increase the chance of getting cervical cancer. In these cases, your health care provider may recommend more frequent screening and Pap tests.  The HPV test is an additional test that may be used for cervical cancer screening. The HPV test looks for the virus that can cause the cell changes on the cervix. The cells collected during the Pap test can be  tested for HPV. The HPV test could be used to screen women aged 30 years and older, and should be used in women of any age who have unclear Pap test results. After the age of 30, women should have HPV testing at the same frequency as a Pap test.  Colorectal cancer can be detected and often prevented. Most routine colorectal cancer screening begins at the age of 50 years and continues through age 75 years. However, your health care provider may recommend screening at an earlier age if you have risk factors for colon cancer. On a yearly basis, your health care provider may provide home test kits to check for hidden blood in the stool. Use of a small camera at the end of a tube, to directly examine the colon (sigmoidoscopy or colonoscopy), can detect the earliest forms of colorectal cancer. Talk to your health care provider about this at age 50, when routine screening begins. Direct  exam of the colon should be repeated every 5-10 years through age 75 years, unless early forms of pre-cancerous polyps or small growths are found.  People who are at an increased risk for hepatitis B should be screened for this virus. You are considered at high risk for hepatitis B if:  You were born in a country where hepatitis B occurs often. Talk with your health care provider about which countries are considered high risk.  Your parents were born in a high-risk country and you have not received a shot to protect against hepatitis B (hepatitis B vaccine).  You have HIV or AIDS.  You use needles to inject street drugs.  You live with, or have sex with, someone who has hepatitis B.  You get hemodialysis treatment.  You take certain medicines for conditions like cancer, organ transplantation, and autoimmune conditions.  Hepatitis C blood testing is recommended for all people born from 1945 through 1965 and any individual with known risks for hepatitis C.  Practice safe sex. Use condoms and avoid high-risk sexual practices to reduce the spread of sexually transmitted infections (STIs). STIs include gonorrhea, chlamydia, syphilis, trichomonas, herpes, HPV, and human immunodeficiency virus (HIV). Herpes, HIV, and HPV are viral illnesses that have no cure. They can result in disability, cancer, and death.  You should be screened for sexually transmitted illnesses (STIs) including gonorrhea and chlamydia if:  You are sexually active and are younger than 24 years.  You are older than 24 years and your health care provider tells you that you are at risk for this type of infection.  Your sexual activity has changed since you were last screened and you are at an increased risk for chlamydia or gonorrhea. Ask your health care provider if you are at risk.  If you are at risk of being infected with HIV, it is recommended that you take a prescription medicine daily to prevent HIV infection. This is  called preexposure prophylaxis (PrEP). You are considered at risk if:  You are a heterosexual woman, are sexually active, and are at increased risk for HIV infection.  You take drugs by injection.  You are sexually active with a partner who has HIV.  Talk with your health care provider about whether you are at high risk of being infected with HIV. If you choose to begin PrEP, you should first be tested for HIV. You should then be tested every 3 months for as long as you are taking PrEP.  Osteoporosis is a disease in which the bones lose minerals and strength   with aging. This can result in serious bone fractures or breaks. The risk of osteoporosis can be identified using a bone density scan. Women ages 65 years and over and women at risk for fractures or osteoporosis should discuss screening with their health care providers. Ask your health care provider whether you should take a calcium supplement or vitamin D to reduce the rate of osteoporosis.  Menopause can be associated with physical symptoms and risks. Hormone replacement therapy is available to decrease symptoms and risks. You should talk to your health care provider about whether hormone replacement therapy is right for you.  Use sunscreen. Apply sunscreen liberally and repeatedly throughout the day. You should seek shade when your shadow is shorter than you. Protect yourself by wearing long sleeves, pants, a wide-brimmed hat, and sunglasses year round, whenever you are outdoors.  Once a month, do a whole body skin exam, using a mirror to look at the skin on your back. Tell your health care provider of new moles, moles that have irregular borders, moles that are larger than a pencil eraser, or moles that have changed in shape or color.  Stay current with required vaccines (immunizations).  Influenza vaccine. All adults should be immunized every year.  Tetanus, diphtheria, and acellular pertussis (Td, Tdap) vaccine. Pregnant women should  receive 1 dose of Tdap vaccine during each pregnancy. The dose should be obtained regardless of the length of time since the last dose. Immunization is preferred during the 27th-36th week of gestation. An adult who has not previously received Tdap or who does not know her vaccine status should receive 1 dose of Tdap. This initial dose should be followed by tetanus and diphtheria toxoids (Td) booster doses every 10 years. Adults with an unknown or incomplete history of completing a 3-dose immunization series with Td-containing vaccines should begin or complete a primary immunization series including a Tdap dose. Adults should receive a Td booster every 10 years.  Varicella vaccine. An adult without evidence of immunity to varicella should receive 2 doses or a second dose if she has previously received 1 dose. Pregnant females who do not have evidence of immunity should receive the first dose after pregnancy. This first dose should be obtained before leaving the health care facility. The second dose should be obtained 4-8 weeks after the first dose.  Human papillomavirus (HPV) vaccine. Females aged 13-26 years who have not received the vaccine previously should obtain the 3-dose series. The vaccine is not recommended for use in pregnant females. However, pregnancy testing is not needed before receiving a dose. If a female is found to be pregnant after receiving a dose, no treatment is needed. In that case, the remaining doses should be delayed until after the pregnancy. Immunization is recommended for any person with an immunocompromised condition through the age of 26 years if she did not get any or all doses earlier. During the 3-dose series, the second dose should be obtained 4-8 weeks after the first dose. The third dose should be obtained 24 weeks after the first dose and 16 weeks after the second dose.  Zoster vaccine. One dose is recommended for adults aged 60 years or older unless certain conditions are  present.  Measles, mumps, and rubella (MMR) vaccine. Adults born before 1957 generally are considered immune to measles and mumps. Adults born in 1957 or later should have 1 or more doses of MMR vaccine unless there is a contraindication to the vaccine or there is laboratory evidence of immunity to   each of the three diseases. A routine second dose of MMR vaccine should be obtained at least 28 days after the first dose for students attending postsecondary schools, health care workers, or international travelers. People who received inactivated measles vaccine or an unknown type of measles vaccine during 1963-1967 should receive 2 doses of MMR vaccine. People who received inactivated mumps vaccine or an unknown type of mumps vaccine before 1979 and are at high risk for mumps infection should consider immunization with 2 doses of MMR vaccine. For females of childbearing age, rubella immunity should be determined. If there is no evidence of immunity, females who are not pregnant should be vaccinated. If there is no evidence of immunity, females who are pregnant should delay immunization until after pregnancy. Unvaccinated health care workers born before 1957 who lack laboratory evidence of measles, mumps, or rubella immunity or laboratory confirmation of disease should consider measles and mumps immunization with 2 doses of MMR vaccine or rubella immunization with 1 dose of MMR vaccine.  Pneumococcal 13-valent conjugate (PCV13) vaccine. When indicated, a person who is uncertain of her immunization history and has no record of immunization should receive the PCV13 vaccine. An adult aged 19 years or older who has certain medical conditions and has not been previously immunized should receive 1 dose of PCV13 vaccine. This PCV13 should be followed with a dose of pneumococcal polysaccharide (PPSV23) vaccine. The PPSV23 vaccine dose should be obtained at least 8 weeks after the dose of PCV13 vaccine. An adult aged 19  years or older who has certain medical conditions and previously received 1 or more doses of PPSV23 vaccine should receive 1 dose of PCV13. The PCV13 vaccine dose should be obtained 1 or more years after the last PPSV23 vaccine dose.  Pneumococcal polysaccharide (PPSV23) vaccine. When PCV13 is also indicated, PCV13 should be obtained first. All adults aged 65 years and older should be immunized. An adult younger than age 65 years who has certain medical conditions should be immunized. Any person who resides in a nursing home or long-term care facility should be immunized. An adult smoker should be immunized. People with an immunocompromised condition and certain other conditions should receive both PCV13 and PPSV23 vaccines. People with human immunodeficiency virus (HIV) infection should be immunized as soon as possible after diagnosis. Immunization during chemotherapy or radiation therapy should be avoided. Routine use of PPSV23 vaccine is not recommended for American Indians, [place] Natives, or people younger than 65 years unless there are medical conditions that require PPSV23 vaccine. When indicated, people who have unknown immunization and have no record of immunization should receive PPSV23 vaccine. One-time revaccination 5 years after the first dose of PPSV23 is recommended for people aged 19-64 years who have chronic kidney failure, nephrotic syndrome, asplenia, or immunocompromised conditions. People who received 1-2 doses of PPSV23 before age 65 years should receive another dose of PPSV23 vaccine at age 65 years or later if at least 5 years have passed since the previous dose. Doses of PPSV23 are not needed for people immunized with PPSV23 at or after age 65 years.  Meningococcal vaccine. Adults with asplenia or persistent complement component deficiencies should receive 2 doses of quadrivalent meningococcal conjugate (MenACWY-D) vaccine. The doses should be obtained at least 2 months apart.  Microbiologists working with certain meningococcal bacteria, military recruits, people at risk during an outbreak, and people who travel to or live in countries with a high rate of meningitis should be immunized. A first-year college student up through age   21 years who is living in a residence hall should receive a dose if she did not receive a dose on or after her 16th birthday. Adults who have certain high-risk conditions should receive one or more doses of vaccine.  Hepatitis A vaccine. Adults who wish to be protected from this disease, have certain high-risk conditions, work with hepatitis A-infected animals, work in hepatitis A research labs, or travel to or work in countries with a high rate of hepatitis A should be immunized. Adults who were previously unvaccinated and who anticipate close contact with an international adoptee during the first 60 days after arrival in the [place] from a country with a high rate of hepatitis A should be immunized.  Hepatitis B vaccine. Adults who wish to be protected from this disease, have certain high-risk conditions, may be exposed to blood or other infectious body fluids, are household contacts or sex partners of hepatitis B positive people, are clients or workers in certain care facilities, or travel to or work in countries with a high rate of hepatitis B should be immunized.  Haemophilus influenzae type b (Hib) vaccine. A previously unvaccinated person with asplenia or sickle cell disease or having a scheduled splenectomy should receive 1 dose of Hib vaccine. Regardless of previous immunization, a recipient of a hematopoietic stem cell transplant should receive a 3-dose series 6-12 months after her successful transplant. Hib vaccine is not recommended for adults with HIV infection. Preventive Services / Frequency Ages 64 to 68 years  Blood pressure check.** / Every 1 to 2 years.  Lipid and cholesterol check.** / Every 5 years beginning at age  22.  Clinical breast exam.** / Every 3 years for women in their 88s and 53s.  BRCA-related cancer risk assessment.** / For women who have family members with a BRCA-related cancer (breast, ovarian, tubal, or peritoneal cancers).  Pap test.** / Every 2 years from ages 90 through 51. Every 3 years starting at age 21 through age 56 or 3 with a history of 3 consecutive normal Pap tests.  HPV screening.** / Every 3 years from ages 24 through ages 1 to 46 with a history of 3 consecutive normal Pap tests.  Hepatitis C blood test.** / For any individual with known risks for hepatitis C.  Skin self-exam. / Monthly.  Influenza vaccine. / Every year.  Tetanus, diphtheria, and acellular pertussis (Tdap, Td) vaccine.** / Consult your health care provider. Pregnant women should receive 1 dose of Tdap vaccine during each pregnancy. 1 dose of Td every 10 years.  Varicella vaccine.** / Consult your health care provider. Pregnant females who do not have evidence of immunity should receive the first dose after pregnancy.  HPV vaccine. / 3 doses over 6 months, if 72 and younger. The vaccine is not recommended for use in pregnant females. However, pregnancy testing is not needed before receiving a dose.  Measles, mumps, rubella (MMR) vaccine.** / You need at least 1 dose of MMR if you were born in 1957 or later. You may also need a 2nd dose. For females of childbearing age, rubella immunity should be determined. If there is no evidence of immunity, females who are not pregnant should be vaccinated. If there is no evidence of immunity, females who are pregnant should delay immunization until after pregnancy.  Pneumococcal 13-valent conjugate (PCV13) vaccine.** / Consult your health care provider.  Pneumococcal polysaccharide (PPSV23) vaccine.** / 1 to 2 doses if you smoke cigarettes or if you have certain conditions.  Meningococcal vaccine.** /  1 dose if you are age 19 to 21 years and a first-year college  student living in a residence hall, or have one of several medical conditions, you need to get vaccinated against meningococcal disease. You may also need additional booster doses.  Hepatitis A vaccine.** / Consult your health care provider.  Hepatitis B vaccine.** / Consult your health care provider.  Haemophilus influenzae type b (Hib) vaccine.** / Consult your health care provider. Ages 40 to 64 years  Blood pressure check.** / Every 1 to 2 years.  Lipid and cholesterol check.** / Every 5 years beginning at age 20 years.  Lung cancer screening. / Every year if you are aged 55-80 years and have a 30-pack-year history of smoking and currently smoke or have quit within the past 15 years. Yearly screening is stopped once you have quit smoking for at least 15 years or develop a health problem that would prevent you from having lung cancer treatment.  Clinical breast exam.** / Every year after age 40 years.  BRCA-related cancer risk assessment.** / For women who have family members with a BRCA-related cancer (breast, ovarian, tubal, or peritoneal cancers).  Mammogram.** / Every year beginning at age 40 years and continuing for as long as you are in good health. Consult with your health care provider.  Pap test.** / Every 3 years starting at age 30 years through age 65 or 70 years with a history of 3 consecutive normal Pap tests.  HPV screening.** / Every 3 years from ages 30 years through ages 65 to 70 years with a history of 3 consecutive normal Pap tests.  Fecal occult blood test (FOBT) of stool. / Every year beginning at age 50 years and continuing until age 75 years. You may not need to do this test if you get a colonoscopy every 10 years.  Flexible sigmoidoscopy or colonoscopy.** / Every 5 years for a flexible sigmoidoscopy or every 10 years for a colonoscopy beginning at age 50 years and continuing until age 75 years.  Hepatitis C blood test.** / For all people born from 1945 through  1965 and any individual with known risks for hepatitis C.  Skin self-exam. / Monthly.  Influenza vaccine. / Every year.  Tetanus, diphtheria, and acellular pertussis (Tdap/Td) vaccine.** / Consult your health care provider. Pregnant women should receive 1 dose of Tdap vaccine during each pregnancy. 1 dose of Td every 10 years.  Varicella vaccine.** / Consult your health care provider. Pregnant females who do not have evidence of immunity should receive the first dose after pregnancy.  Zoster vaccine.** / 1 dose for adults aged 60 years or older.  Measles, mumps, rubella (MMR) vaccine.** / You need at least 1 dose of MMR if you were born in 1957 or later. You may also need a 2nd dose. For females of childbearing age, rubella immunity should be determined. If there is no evidence of immunity, females who are not pregnant should be vaccinated. If there is no evidence of immunity, females who are pregnant should delay immunization until after pregnancy.  Pneumococcal 13-valent conjugate (PCV13) vaccine.** / Consult your health care provider.  Pneumococcal polysaccharide (PPSV23) vaccine.** / 1 to 2 doses if you smoke cigarettes or if you have certain conditions.  Meningococcal vaccine.** / Consult your health care provider.  Hepatitis A vaccine.** / Consult your health care provider.  Hepatitis B vaccine.** / Consult your health care provider.  Haemophilus influenzae type b (Hib) vaccine.** / Consult your health care provider. Ages 65   years and over  Blood pressure check.** / Every 1 to 2 years.  Lipid and cholesterol check.** / Every 5 years beginning at age 22 years.  Lung cancer screening. / Every year if you are aged 73-80 years and have a 30-pack-year history of smoking and currently smoke or have quit within the past 15 years. Yearly screening is stopped once you have quit smoking for at least 15 years or develop a health problem that would prevent you from having lung cancer  treatment.  Clinical breast exam.** / Every year after age 4 years.  BRCA-related cancer risk assessment.** / For women who have family members with a BRCA-related cancer (breast, ovarian, tubal, or peritoneal cancers).  Mammogram.** / Every year beginning at age 40 years and continuing for as long as you are in good health. Consult with your health care provider.  Pap test.** / Every 3 years starting at age 9 years through age 34 or 91 years with 3 consecutive normal Pap tests. Testing can be stopped between 65 and 70 years with 3 consecutive normal Pap tests and no abnormal Pap or HPV tests in the past 10 years.  HPV screening.** / Every 3 years from ages 57 years through ages 64 or 45 years with a history of 3 consecutive normal Pap tests. Testing can be stopped between 65 and 70 years with 3 consecutive normal Pap tests and no abnormal Pap or HPV tests in the past 10 years.  Fecal occult blood test (FOBT) of stool. / Every year beginning at age 15 years and continuing until age 17 years. You may not need to do this test if you get a colonoscopy every 10 years.  Flexible sigmoidoscopy or colonoscopy.** / Every 5 years for a flexible sigmoidoscopy or every 10 years for a colonoscopy beginning at age 86 years and continuing until age 71 years.  Hepatitis C blood test.** / For all people born from [birth year] through 1963 and any individual with known risks for hepatitis C.  Osteoporosis screening.** / A one-time screening for women ages 83 years and over and women at risk for fractures or osteoporosis.  Skin self-exam. / Monthly.  Influenza vaccine. / Every year.  Tetanus, diphtheria, and acellular pertussis (Tdap/Td) vaccine.** / 1 dose of Td every 10 years.  Varicella vaccine.** / Consult your health care provider.  Zoster vaccine.** / 1 dose for adults aged 61 years or older.  Pneumococcal 13-valent conjugate (PCV13) vaccine.** / Consult your health care provider.  Pneumococcal  polysaccharide (PPSV23) vaccine.** / 1 dose for all adults aged 28 years and older.  Meningococcal vaccine.** / Consult your health care provider.  Hepatitis A vaccine.** / Consult your health care provider.  Hepatitis B vaccine.** / Consult your health care provider.  Haemophilus influenzae type b (Hib) vaccine.** / Consult your health care provider. ** Family history and personal history of risk and conditions may change your health care provider's recommendations. Document Released: 05/14/2001 Document Revised: 08/02/2013 Document Reviewed: 08/13/2010 [facility] Patient Information 2015 [place], [place]. This information is not intended to replace advice given to you by your health care provider. Make sure you discuss any questions you have with your health care provider.

## 2016-06-20 NOTE — Telephone Encounter (Signed)
°  Relation to ZO:XWRUpt:self Call back number:[phone]   FYI-  Reason for call:  Patient scheduled TB with nurse on 06/20/16

## 2016-06-20 NOTE — Telephone Encounter (Signed)
Noted  

## 2016-06-21 ENCOUNTER — Ambulatory Visit (INDEPENDENT_AMBULATORY_CARE_PROVIDER_SITE_OTHER): Payer: Managed Care, Other (non HMO)

## 2016-06-21 ENCOUNTER — Other Ambulatory Visit (INDEPENDENT_AMBULATORY_CARE_PROVIDER_SITE_OTHER): Payer: Managed Care, Other (non HMO)

## 2016-06-21 DIAGNOSIS — I10 Essential (primary) hypertension: Secondary | ICD-10-CM | POA: Diagnosis not present

## 2016-06-21 DIAGNOSIS — E785 Hyperlipidemia, unspecified: Secondary | ICD-10-CM | POA: Diagnosis not present

## 2016-06-21 DIAGNOSIS — Z111 Encounter for screening for respiratory tuberculosis: Secondary | ICD-10-CM

## 2016-06-21 LAB — LIPID PANEL
Cholesterol: 214 mg/dL — ABNORMAL HIGH (ref 0–200)
HDL: 36.9 mg/dL — AB (ref >39.00)
LDL CALC: 159 mg/dL — AB (ref 0–99)
NONHDL: 177.17
Total CHOL/HDL Ratio: 6
Triglycerides: 92 mg/dL (ref 0.0–149.0)
VLDL: 18.4 mg/dL (ref 0.0–40.0)

## 2016-06-21 LAB — COMPREHENSIVE METABOLIC PANEL
ALK PHOS: 88 U/L (ref 39–117)
ALT: 61 U/L — ABNORMAL HIGH (ref 0–35)
AST: 42 U/L — ABNORMAL HIGH (ref 0–37)
Albumin: 4.1 g/dL (ref 3.5–5.2)
BUN: 8 mg/dL (ref 6–23)
CHLORIDE: 102 meq/L (ref 96–112)
CO2: 27 mEq/L (ref 19–32)
Calcium: 9.1 mg/dL (ref 8.4–10.5)
Creatinine, Ser: 0.61 mg/dL (ref 0.40–1.20)
GFR: 137.35 mL/min (ref >60.00)
GLUCOSE: 222 mg/dL — AB (ref 70–99)
POTASSIUM: 3.3 meq/L — AB (ref 3.5–5.1)
Sodium: 138 mEq/L (ref 135–145)
TOTAL PROTEIN: 7.6 g/dL (ref 6.0–8.3)
Total Bilirubin: 0.4 mg/dL (ref 0.2–1.2)

## 2016-06-21 LAB — CBC
HEMATOCRIT: 40.7 % (ref 36.0–46.0)
HEMOGLOBIN: 13.8 g/dL (ref 12.0–15.0)
MCHC: 33.8 g/dL (ref 30.0–36.0)
MCV: 87.4 fl (ref 78.0–100.0)
Platelets: 303 10*3/uL (ref 150.0–400.0)
RBC: 4.65 Mil/uL (ref 3.87–5.11)
RDW: 13.1 % (ref 11.5–15.5)
WBC: 6.9 10*3/uL (ref 4.0–10.5)

## 2016-06-21 NOTE — Progress Notes (Addendum)
Pre visit review using our clinic tool,if applicable. No additional management support is needed unless otherwise documented below in the visit note.   Patient in for PPD placement. PPD [facility].Dr.. Lownes- nurse has at her desk.  Given 0.1 ml ID right forearm. Patient states she has had 1  Positive PPD in the past and did have XR done.  Patient advised not to place pressure at the sight of injection.      Patient advised she will need to be back on Monday March 26th no later than 9:47 am to have PPD read otherwise test will have to be repeated. Patient states she understands.

## 2016-06-24 ENCOUNTER — Other Ambulatory Visit: Payer: Self-pay | Admitting: Family Medicine

## 2016-06-24 ENCOUNTER — Telehealth: Payer: Self-pay

## 2016-06-24 DIAGNOSIS — E785 Hyperlipidemia, unspecified: Secondary | ICD-10-CM

## 2016-06-24 MED ORDER — EZETIMIBE 10 MG PO TABS: 10.0000 mg | ORAL_TABLET | Freq: Every day | ORAL | 5 refills | Status: DC

## 2016-06-24 NOTE — Telephone Encounter (Signed)
Called patient to reschedule PPD placement. Patient states she forgot to be in today by 9:30 am. Rescheduled for tomorrow at 3:45 pm

## 2016-06-25 ENCOUNTER — Ambulatory Visit (INDEPENDENT_AMBULATORY_CARE_PROVIDER_SITE_OTHER): Payer: Managed Care, Other (non HMO)

## 2016-06-25 DIAGNOSIS — Z111 Encounter for screening for respiratory tuberculosis: Secondary | ICD-10-CM | POA: Diagnosis not present

## 2016-06-25 NOTE — Progress Notes (Deleted)
Pre visit review using our clinic review tool, if applicable. No additional management support is needed unless otherwise documented below in the visit note. 

## 2016-06-25 NOTE — Progress Notes (Signed)
Pre visit review using our clinic tool,if applicable. No additional management support is needed unless otherwise documented below in the visit note.   Patient in today for 2nd PPD for [facility]. First PPD given was invalid because patient did not come back in to have read as advised.Given  ID Left forearm. Patient advised to return to office for reading no later than 3:57 on 06/27/16. Patient agreed.   No redness nor indentation noted on R arm where PPD placed on 11/21/16

## 2016-06-26 ENCOUNTER — Ambulatory Visit: Payer: Managed Care, Other (non HMO)

## 2016-06-27 ENCOUNTER — Telehealth: Payer: Self-pay

## 2016-06-27 ENCOUNTER — Encounter: Payer: Self-pay | Admitting: Behavioral Health

## 2016-06-27 LAB — TB SKIN TEST
INDURATION: 11 mm
TB SKIN TEST: POSITIVE

## 2016-06-27 NOTE — Telephone Encounter (Signed)
Spoke to [employer] Faucet from [facility] on behalf of patient's positive TB reading today with RN. Tammy state she need patient's demographic, date of TB placed, date of TB reading, and a referral form for TB Control Program at the [facility]. Requested faxed and place in the bin on desk. LB

## 2016-07-03 ENCOUNTER — Telehealth: Payer: Self-pay | Admitting: Neurology

## 2016-07-03 NOTE — Telephone Encounter (Signed)
Requested authorization for Split and HST from [facility] and both sleep studies were denied.  Notified the patient of the denial.  A peer to peer can be done with a [place] doctor.  If you want this set up please let me know.tu

## 2016-07-08 ENCOUNTER — Ambulatory Visit
Admission: RE | Admit: 2016-07-08 | Discharge: 2016-07-08 | Disposition: A | Discharge status: Home / Self Care | Payer: Managed Care, Other (non HMO) | Source: Ambulatory Visit | Attending: Infectious Disease | Admitting: Infectious Disease

## 2016-07-08 ENCOUNTER — Other Ambulatory Visit: Payer: Self-pay | Admitting: Infectious Disease

## 2016-07-08 DIAGNOSIS — Z111 Encounter for screening for respiratory tuberculosis: Secondary | ICD-10-CM

## 2016-07-08 NOTE — Telephone Encounter (Signed)
Kathleen Jordan, can you look into this again? On what basis was she denied? She has a prior diagnosis of severe OSA, poorly controlled diabetes, hypertension, hyperlipidemia, and obesity.

## 2016-07-25 ENCOUNTER — Other Ambulatory Visit: Payer: Self-pay | Admitting: Obstetrics and Gynecology

## 2016-07-29 ENCOUNTER — Ambulatory Visit (INDEPENDENT_AMBULATORY_CARE_PROVIDER_SITE_OTHER): Payer: Managed Care, Other (non HMO) | Admitting: Neurology

## 2016-07-29 DIAGNOSIS — G4733 Obstructive sleep apnea (adult) (pediatric): Secondary | ICD-10-CM

## 2016-07-31 ENCOUNTER — Other Ambulatory Visit: Payer: Self-pay | Admitting: Family Medicine

## 2016-08-06 ENCOUNTER — Encounter: Payer: Self-pay | Admitting: Registered"

## 2016-08-06 ENCOUNTER — Encounter: Payer: Managed Care, Other (non HMO) | Attending: Endocrinology | Admitting: Registered"

## 2016-08-06 DIAGNOSIS — Z713 Dietary counseling and surveillance: Secondary | ICD-10-CM | POA: Diagnosis not present

## 2016-08-06 DIAGNOSIS — Z6837 Body mass index (BMI) 37.0-37.9, adult: Secondary | ICD-10-CM | POA: Diagnosis not present

## 2016-08-06 DIAGNOSIS — E119 Type 2 diabetes mellitus without complications: Secondary | ICD-10-CM | POA: Insufficient documentation

## 2016-08-06 NOTE — Addendum Note (Signed)
Addended by: Huston FoleyATHAR, Karter Hellmer on: 08/06/2016 08:30 AM   Modules accepted: Orders

## 2016-08-06 NOTE — Patient Instructions (Addendum)
Breakfast ideas: Smoothie: plain yogurt ([place] or other higher in protein), 1/2 C berries, 1/4 banana, peanut butter, flaxseeds, and chia seeds. Oatmeal: add protein  Avoid skipping meals  Managing Diabetes Plan:  Aim for 2-3 Carb Choices per meal (30-45 grams) +/- 1 either way  Aim for 0-1 Carbs per snack if hungry  Include protein in moderation with your meals and snacks Consider reading food labels for Total Carbohydrate  Consider increasing your activity level daily as tolerated: fun website for exercise at your desk http://jones.com/https://www.washingtonpost.com/graphics/health/workout-at-work/ Consider checking BG at alternate times per day as directed by MD Look into [facility] [place] continuous glucose monitoring Continue taking medication as directed by MD  PCOS Plan:  Aim for 5-6 small balanced meals per day  Include fruits and vegetables - may also help with BP  Avoid low-fat, high carbohydrate diet which could promote extra insulin secretion  Consider adding more soy foods in diet, may help control insulin production  Consider increasing fiber in diet  Consider including foods rich in omega-3 fatty acids such as albacore tuna fish,  flaxseeds, and chia seeds.

## 2016-08-06 NOTE — Progress Notes (Signed)
Diabetes Self-Management Education  Visit Type: First/Initial  Appt. Start Time: 1000 Appt. End Time: 1100  08/06/2016  [name], identified by name and date of birth, is a 46 y.o. female with a diagnosis of Diabetes: Type 2.   ASSESSMENT Pt states she has PCOS (RD confirmed in chart) and was on metformin before she became pregnant. Pt states she gained a lot of weight (~20lb) after baby was born. Pt states her cycles became regular but she just started OCP yesterday which was prescribed due to heavy periods. Pt states her thyroid has stablized. Pt reports that her doctor, Kathleen Jordan, diagnosed her with NAFL. RD suggested Pt schedule another appointment for PCOS nutrition therapy.  Pt states her biggest stress is her work schedule which she has had since 2011. Pt reports 12-14 hrs days M-F and works another part-time job Sat mornings.   Height 5' 8.5" (1.74 m), weight 250 lb 9.6 oz (113.7 kg). Body mass index is 37.55 kg/m.      Diabetes Self-Management Education - 08/06/16 1008      Visit Information   Visit Type First/Initial     Initial Visit   Diabetes Type Type 2   Are you currently following a meal plan? No   Are you taking your medications as prescribed? Yes   Date Diagnosed 01/2010     Health Coping   How would you rate your overall health? Fair     Psychosocial Assessment   Patient Belief/Attitude about Diabetes Motivated to manage diabetes   How often do you need to have someone help you when you read instructions, pamphlets, or other written materials from your doctor or pharmacy? 1 - Never   What is the last grade level you completed in school? post college     Complications   Last HgB A1C per patient/outside source 10 %  jan 2018 per pt   How often do you check your blood sugar? 1-2 times/day   Postprandial Blood glucose range (mg/dL) >161>200  checks when she feels it is high 250-280   Have you had a dilated eye exam in the past 12 months? No   Have you had a dental exam in the past 12 months? Yes   Are you checking your feet? Yes   How many days per week are you checking your feet? 7     Dietary Intake   Breakfast none    Snack (morning) sandwich, fruit  10:30   Lunch burger or hotdog or chicken nuggets, fries, drink    2:30-3 pm   Snack (afternoon) none   Dinner fast food - sometimes salad  9:30-10pm    Snack (evening) chips OR cookies OR cheese & crackers  12 pm   Beverage(s) water, diet soda, ginger ale (both diet & regular), V8 splash     Exercise   Exercise Type ADL's   How many days per week to you exercise? 0   How many minutes per day do you exercise? 0   Total minutes per week of exercise 0     Patient Education   Previous Diabetes Education Yes (please comment)  2012   Nutrition management  Role of diet in the treatment of diabetes and the relationship between the three main macronutrients and blood glucose level;Food label reading, portion sizes and measuring food.;Carbohydrate counting   Physical activity and exercise  Role of exercise on diabetes management, blood pressure control and cardiac health.   Monitoring Identified appropriate SMBG and/or A1C goals.  Individualized Goals (developed by patient)   Nutrition Follow meal plan discussed     Outcomes   Expected Outcomes Demonstrated interest in learning. Expect positive outcomes   Future DMSE PRN   Program Status Completed      Individualized Plan for Diabetes Self-Management Training:   Learning Objective:  Patient will have a greater understanding of diabetes self-management. Patient education plan is to attend individual and/or group sessions per assessed needs and concerns.  Patient Instructions  Breakfast ideas: Smoothie: plain yogurt ([place] or other higher in protein), 1/2 C berries, 1/4 banana, peanut butter, flaxseeds, and chia seeds. Oatmeal: add protein  Avoid skipping meals  Managing Diabetes Plan:  Aim for 2-3 Carb Choices  per meal (30-45 grams) +/- 1 either way  Aim for 0-1 Carbs per snack if hungry  Include protein in moderation with your meals and snacks Consider reading food labels for Total Carbohydrate  Consider increasing your activity level daily as tolerated: fun website for exercise at your desk http://jones.com/ Consider checking BG at alternate times per day as directed by MD Look into [facility] continuous glucose monitoring Continue taking medication as directed by MD  PCOS Plan:  Aim for 5-6 small balanced meals per day  Include fruits and vegetables - may also help with BP  Avoid low-fat, high carbohydrate diet which could promote extra insulin secretion  Consider adding more soy foods in diet, may help control insulin production  Consider increasing fiber in diet  Consider including foods rich in omega-3 fatty acids such as albacore tuna fish,  flaxseeds, and chia seeds.   Expected Outcomes:  Demonstrated interest in learning. Expect positive outcomes  Education material provided: My Plate and Carbohydrate counting sheet, Freestyle WellPoint, PCOS resources, PCOS tips.  If problems or questions, patient to contact team via:  Phone and My Chart  Future DSME appointment: PRN

## 2016-08-06 NOTE — Progress Notes (Signed)
Patient referred by Dr. Mcneil SoberGivans, dentistry, seen by me on 11/28/15, diagnostic PSG on 07/29/16.    Please call and notify the patient that the recent sleep study did confirm the diagnosis of near severe obstructive sleep apnea and that I recommend treatment for this in the form of CPAP. This will require a repeat sleep study for proper titration and mask fitting. Please explain to patient and arrange for a CPAP titration study. I have placed an order in the chart. Thanks, and [facility].  Huston FoleySaima Tomasita Beevers, MD, PhD [facility])

## 2016-08-06 NOTE — Procedures (Signed)
  [facility] [address], [address] [place], [place] [address]  NAME: Kathleen Jordan DOB: 15-Feb-1971 MEDICAL RECORD [id number]  DOS: 07/29/16 REFERRING PHYSICIAN: Lannette DonathNatalie Givens,MD  Study Performed:  HST/Out of Center Sleep Test  HISTORY: 46 year old woman with a history of hypertension, hyperlipidemia, PCOS, allergic rhinitis, obesity, and type 2 diabetes, who was previously diagnosed with obstructive sleep apnea over 10 years ago. She is no longer using CPAP and presents for re-evaluation. Her Epworth sleepiness score is 10 out of 24 today, fatigue score is 55 out of 63. BMI of 37.7.   STUDY RESULTS: Total Recording Time: 6h 14 m Total Apnea/Hypopnea Index (AHI):  29.8/hour Average Oxygen Saturation: 93% Lowest Oxygen Saturation: 83% Average Mean Heart Rate:   72 bpm  IMPRESSION:   Obstructive Sleep Apnea (OSA)    RECOMMENDATION: This home sleep test demonstrates near severe obstructive sleep apnea with a total AHI of 29.8/hour and O2 nadir of 83%. Given the patient's medical history and sleep related complaints, treatment with positive airway pressure (in the form of CPAP) is recommended. This will require a full night CPAP titration study for proper treatment settings and mask fitting.  Please note that untreated obstructive sleep apnea carries additional perioperative morbidity. Patients with significant obstructive sleep apnea should receive perioperative PAP therapy and the surgeons and particularly the anesthesiologist should be informed of the diagnosis and the severity of the sleep disordered breathing. The patient should be cautioned not to drive, work at heights, or operate dangerous or heavy equipment when tired or sleepy. Review and reiteration of good sleep hygiene measures should be pursued with any patient. The patient and his referring provider will be notified of the test results. The patient will be seen in follow [facility]. I certify that I have reviewed the raw data recording prior to the issuance of this report in accordance with the standards of Accreditation of the American Academy of Sleep medicine (AASM).  Huston FoleySaima Holle Sprick, MD, PhD [facility]) Diplomat, ABPN (neurology and sleep)

## 2016-08-07 ENCOUNTER — Telehealth: Payer: Self-pay

## 2016-08-07 NOTE — Telephone Encounter (Signed)
-----   Message from Huston FoleySaima Athar, MD sent at 08/06/2016  8:30 AM EDT ----- Patient referred by Dr. Mcneil SoberGivans, dentistry, seen by me on 11/28/15, diagnostic PSG on 07/29/16.    Please call and notify the patient that the recent sleep study did confirm the diagnosis of near severe obstructive sleep apnea and that I recommend treatment for this in the form of CPAP. This will require a repeat sleep study for proper titration and mask fitting. Please explain to patient and arrange for a CPAP titration study. I have placed an order in the chart. Thanks, and [facility].  Huston FoleySaima Athar, MD, PhD [facility])

## 2016-08-07 NOTE — Telephone Encounter (Signed)
LM for patient to call back for results

## 2016-08-13 ENCOUNTER — Telehealth: Payer: Self-pay | Admitting: Neurology

## 2016-08-13 DIAGNOSIS — G4733 Obstructive sleep apnea (adult) (pediatric): Secondary | ICD-10-CM

## 2016-08-13 NOTE — Telephone Encounter (Signed)
[employer] denied CPAP but sent an authorization for autopap with compliance monitoring

## 2016-08-13 NOTE — Telephone Encounter (Signed)
Kathleen Jordan with [facility] called office requesting a prescription with numeric pressure settings for autopap.  Per Kathleen Jordan she states [employer] AuerbachC[employer] is going to deny the CPAP titration, but approving autopap.  Fax number- [phone].

## 2016-08-14 NOTE — Telephone Encounter (Signed)
We will set patient up with autoPAP at home, as insurance denied in house titration study for OSA. Pls process order and notify patient and set up FU in 10 weeks.       

## 2016-08-15 NOTE — Telephone Encounter (Signed)
Patient called office returning Rn's call.  Please call

## 2016-08-20 NOTE — Telephone Encounter (Signed)
LM for patient to call back.

## 2016-08-21 NOTE — Telephone Encounter (Signed)
Patient called back. I was able to give results and recommendations. Patient is willing to proceed with Auto-PAP treatment. I will send report to PCP. Patient was able to make f/u appt.

## 2016-09-02 ENCOUNTER — Other Ambulatory Visit: Payer: Self-pay | Admitting: Family Medicine

## 2016-09-05 ENCOUNTER — Encounter: Payer: Managed Care, Other (non HMO) | Attending: Endocrinology | Admitting: Registered"

## 2016-09-05 DIAGNOSIS — Z713 Dietary counseling and surveillance: Secondary | ICD-10-CM | POA: Insufficient documentation

## 2016-09-05 DIAGNOSIS — E119 Type 2 diabetes mellitus without complications: Secondary | ICD-10-CM | POA: Diagnosis present

## 2016-09-05 DIAGNOSIS — Z6837 Body mass index (BMI) 37.0-37.9, adult: Secondary | ICD-10-CM | POA: Insufficient documentation

## 2016-09-05 DIAGNOSIS — E282 Polycystic ovarian syndrome: Secondary | ICD-10-CM

## 2016-09-05 NOTE — Progress Notes (Signed)
Follow-up for PCOS  Appt. Start Time: 1100 Appt. End Time: 1130  09/05/2016  ASSESSMENT Pt states since last visit her doctor started spironolactone to treat testosterone related symptoms, but hasn't noticed a difference yet. Pt states she has tried metformin but her doctor has discontinued due to liver function test results. Pt also reports metformin caused some GI discomfort.  Pt states her barrier to eating regular meals is her work schedule. Pt eats breakfast about 4x/week and often skips lunch. Pt states that she is often on the road for her job during the afternoon. Pt reports carb cravings in the evenings. Pt states she doesn't like nuts, but loves fresh fruit. Pt states she has started walking 20 min 3x/week.  Preferred Learning Style:   No preference indicated   Learning Readiness:   Ready  MEDICATIONS: reviewed  DIETARY INTAKE:  24-hr recall:  B ( AM): none or oatmeal OR nutrigrain bar OR 2 sausage biscuit Snk ( AM):  none L (2-3 PM): grilled chicken, corn, rice OR sloppy joe Snk ( PM): D ( PM): steak or shrimp, baked potato, salad (spinach)  Snk ( PM): craves carbs Beverages: ice water, diet soda, no juice (her doctor advised against)  Usual physical activity: walk 20 min 3x/week  Estimated energy needs: 1800 calories  Progress Towards Goal(s):  In progress.              Nutritional Diagnosis: NI-1.2 Inadequate energy intake related to food and nutrition-related knowledge deficit concerning needs of consistent meal intake with adequate protein and carbohydrate to provided consistent energy and nutrients as evidenced by diet recall of skipped meals and pt reported nighttime carb cravings.               Intervention:  Nutrition Education. Answered question regarding purpose and action of sprionolactone on PCOS. Discussed need for eating plenty of food throughout the day to maintain energy and health. Discussed importance of protein as well as eating foods which  we find satisfying. Compared differences/similarties of different foods which provide energy.   Plan:  [employer]  Inositol - https://theralogix.com/products/ovasitol-inositol-powder  Get Vitamin D checked  Consider taking fish oil supplements  Consider monitoring your blood sugar to experiment with different foods  About 30-45 g carb per meal, make sure to get plenty of protein with meals  Think about having quick easy options available to avoid skipping meals  Education material provided:  PCOS tips, Inositol supplement information for PCOS, Omega-3 supplement information.  Future appointment: 6 weeks

## 2016-09-05 NOTE — Patient Instructions (Addendum)
   [employer]  Inositol - https://theralogix.com/products/ovasitol-inositol-powder  Get Vitamin D checked  Consider taking fish oil supplements  Consider monitoring your blood sugar to experiment with different foods  About 30-45 g carb per meal, make sure to get plenty of protein with meals  Think about having quick easy options available to avoid skipping meals

## 2016-09-19 ENCOUNTER — Other Ambulatory Visit (INDEPENDENT_AMBULATORY_CARE_PROVIDER_SITE_OTHER): Payer: Managed Care, Other (non HMO)

## 2016-09-19 DIAGNOSIS — E785 Hyperlipidemia, unspecified: Secondary | ICD-10-CM

## 2016-09-19 LAB — LIPID PANEL
CHOLESTEROL: 157 mg/dL (ref 0–200)
HDL: 39.2 mg/dL (ref >39.00)
LDL CALC: 104 mg/dL — AB (ref 0–99)
NonHDL: 117.87
Total CHOL/HDL Ratio: 4
Triglycerides: 67 mg/dL (ref 0.0–149.0)
VLDL: 13.4 mg/dL (ref 0.0–40.0)

## 2016-09-19 LAB — COMPREHENSIVE METABOLIC PANEL
ALBUMIN: 4.1 g/dL (ref 3.5–5.2)
ALT: 61 U/L — ABNORMAL HIGH (ref 0–35)
AST: 38 U/L — AB (ref 0–37)
Alkaline Phosphatase: 90 U/L (ref 39–117)
BUN: 11 mg/dL (ref 6–23)
CHLORIDE: 103 meq/L (ref 96–112)
CO2: 25 meq/L (ref 19–32)
CREATININE: 0.76 mg/dL (ref 0.40–1.20)
Calcium: 9.6 mg/dL (ref 8.4–10.5)
GFR: 106.45 mL/min (ref >60.00)
GLUCOSE: 278 mg/dL — AB (ref 70–99)
POTASSIUM: 3.6 meq/L (ref 3.5–5.1)
SODIUM: 135 meq/L (ref 135–145)
Total Bilirubin: 0.4 mg/dL (ref 0.2–1.2)
Total Protein: 7.3 g/dL (ref 6.0–8.3)

## 2016-09-27 ENCOUNTER — Other Ambulatory Visit: Payer: Self-pay | Admitting: Family Medicine

## 2016-09-28 ENCOUNTER — Other Ambulatory Visit: Payer: Self-pay | Admitting: Family Medicine

## 2016-10-01 ENCOUNTER — Other Ambulatory Visit: Payer: Self-pay | Admitting: Family Medicine

## 2016-10-01 DIAGNOSIS — E119 Type 2 diabetes mellitus without complications: Secondary | ICD-10-CM

## 2016-10-01 DIAGNOSIS — E785 Hyperlipidemia, unspecified: Secondary | ICD-10-CM

## 2016-10-17 ENCOUNTER — Ambulatory Visit: Payer: Managed Care, Other (non HMO) | Admitting: Registered"

## 2016-10-31 ENCOUNTER — Telehealth: Payer: Self-pay

## 2016-10-31 NOTE — Telephone Encounter (Signed)
I called pt to find out if she has started her cpap yet. [facility] reports that she did not show to the appt to start her on a cpap.  No answer, left a message asking her to call me back.

## 2016-11-04 ENCOUNTER — Ambulatory Visit: Payer: Self-pay | Admitting: Neurology

## 2016-11-04 NOTE — Telephone Encounter (Signed)
I called pt. She reports that she is unable to afford the monthly payments it would require for cpap. I offered her the option of still meeting with Dr. Frances FurbishAthar to discuss treatments for her osa (near severe osa per Dr. Teofilo PodAthar's result note on sleep study). Pt says that she will need to reschedule her appt for today and will call back to do this.

## 2016-11-07 ENCOUNTER — Other Ambulatory Visit: Payer: Self-pay | Admitting: Family Medicine

## 2016-12-07 ENCOUNTER — Other Ambulatory Visit: Payer: Self-pay | Admitting: Family Medicine

## 2017-01-09 ENCOUNTER — Other Ambulatory Visit: Payer: Self-pay | Admitting: Family Medicine

## 2017-01-12 ENCOUNTER — Other Ambulatory Visit: Payer: Self-pay | Admitting: Family Medicine

## 2017-01-26 ENCOUNTER — Telehealth: Payer: Managed Care, Other (non HMO) | Admitting: Family

## 2017-01-26 DIAGNOSIS — B3731 Acute candidiasis of vulva and vagina: Secondary | ICD-10-CM

## 2017-01-26 DIAGNOSIS — B373 Candidiasis of vulva and vagina: Secondary | ICD-10-CM

## 2017-01-26 MED ORDER — FLUCONAZOLE 150 MG PO TABS: 150.0000 mg | ORAL_TABLET | Freq: Once | ORAL | 0 refills | Status: AC

## 2017-01-26 NOTE — Progress Notes (Signed)

## 2017-01-31 ENCOUNTER — Ambulatory Visit (INDEPENDENT_AMBULATORY_CARE_PROVIDER_SITE_OTHER): Payer: Managed Care, Other (non HMO) | Admitting: Family Medicine

## 2017-01-31 ENCOUNTER — Other Ambulatory Visit (HOSPITAL_COMMUNITY)
Admission: RE | Admit: 2017-01-31 | Discharge: 2017-01-31 | Disposition: A | Discharge status: Home / Self Care | Payer: Managed Care, Other (non HMO) | Source: Ambulatory Visit | Attending: Family Medicine | Admitting: Family Medicine

## 2017-01-31 ENCOUNTER — Encounter: Payer: Self-pay | Admitting: Family Medicine

## 2017-01-31 VITALS — BP 146/80 | HR 87 | Temp 98.6°F | Ht 68.0 in | Wt 248.0 lb

## 2017-01-31 DIAGNOSIS — N898 Other specified noninflammatory disorders of vagina: Secondary | ICD-10-CM | POA: Insufficient documentation

## 2017-01-31 DIAGNOSIS — Z23 Encounter for immunization: Secondary | ICD-10-CM | POA: Diagnosis not present

## 2017-01-31 NOTE — Progress Notes (Signed)
Patient ID: Lulu Hirschmann, female    DOB: 1970/09/23  Age: 46 y.o. MRN: [id number]    Subjective:  Subjective  HPI Brittni Ming Kunka presents for vaginal itching.  She has seen gyn and derm and still has a problem .---  She also c/o rectal itching -- derm thought maybe she may need to see GI   Review of Systems  Constitutional: Negative for activity change, appetite change and unexpected weight change.  Respiratory: Negative for cough and shortness of breath.   Cardiovascular: Negative for chest pain and palpitations.  Genitourinary: Negative for decreased urine volume, difficulty urinating, dyspareunia, frequency, genital sores, pelvic pain, urgency, vaginal bleeding, vaginal discharge and vaginal pain.       Vaginal itching  Psychiatric/Behavioral: Negative for behavioral problems and dysphoric mood. The patient is not nervous/anxious.     History Past Medical History:  Diagnosis Date  . Allergic rhinitis   . Diabetes mellitus    insulin  . Difficult intubation    pt states with breast reduction surgery MD's had difficulty  . Hx of varicella   . Hyperlipidemia   . Hypertension   . Hypopotassemia   . Obesity   . PCOS (polycystic ovarian syndrome)   . Postpartum care following vaginal delivery 11/13/2012  . Postpartum care following vaginal delivery (8/15) 11/13/2012  . Sleep apnea     She has a past surgical history that includes Breast reduction surgery (07-2003) and invitro fertilization (11/16/2010).   Her family history includes Allergies in her sister; Arthritis in her maternal grandmother and mother; Asthma in her sister; Cancer in her maternal grandfather and maternal grandmother; Diabetes in her brother, maternal grandmother, and mother; Heart disease (age of onset: 29) in her mother; Hypertension in her brother, maternal grandmother, and mother; Stroke in her maternal grandfather and mother.She reports that she has never smoked. She has never used  smokeless tobacco. She reports that she does not drink alcohol or use drugs.  Current Outpatient Prescriptions on File Prior to Visit  Medication Sig Dispense Refill  . cetirizine (ZYRTEC) 10 MG tablet TAKE 1 TABLET(10 MG) BY MOUTH DAILY 30 tablet 0  . insulin lispro (HUMALOG KWIKPEN) 100 UNIT/ML KiwkPen Inject 0.1 mLs (10 Units total) into the skin as needed. SLIDING SCALE 15 mL   . Insulin NPH, Human,, Isophane, (HUMULIN N PEN) 100 UNIT/ML Kiwkpen 25 UNITS IN THE MORNING AND 35 UNITS IN THE EVENING 15 mL   . NIFEdipine (PROCARDIA XL/ADALAT-CC) 60 MG 24 hr tablet TAKE 1 TABLET BY MOUTH DAILY 90 tablet 1  . Insulin Glargine (BASAGLAR KWIKPEN) 100 UNIT/ML SOPN Inject 75 Units into the skin 2 (two) times daily.     No current facility-administered medications on file prior to visit.      Objective:  Objective  Physical Exam  Constitutional: She is oriented to person, place, and time. She appears well-developed and well-nourished.  HENT:  Head: Normocephalic and atraumatic.  Eyes: Conjunctivae and EOM are normal.  Neck: Normal range of motion. Neck supple. No JVD present. Carotid bruit is not present. No thyromegaly present.  Cardiovascular: Normal rate, regular rhythm and normal heart sounds.   No murmur heard. Pulmonary/Chest: Effort normal and breath sounds normal. No respiratory distress. She has no wheezes. She has no rales. She exhibits no tenderness.  Genitourinary: There is no rash, tenderness, lesion or injury on the right labia. There is no rash, tenderness, lesion or injury on the left labia. There is erythema in the vagina. No tenderness or  bleeding in the vagina. No foreign body in the vagina. No signs of injury around the vagina. Vaginal discharge found.  Musculoskeletal: She exhibits no edema.  Neurological: She is alert and oriented to person, place, and time.  Psychiatric: She has a normal mood and affect.  Nursing note and vitals reviewed.  BP (!) 146/80   Pulse 87    Temp 98.6 F (37 C) (Oral)   Ht 5\' 8"  (1.727 m)   Wt 248 lb (112.5 kg)   LMP 01/13/2017 (Exact Date)   SpO2 98%   BMI 37.71 kg/m  Wt Readings from Last 3 Encounters:  01/31/17 248 lb (112.5 kg)  08/06/16 250 lb 9.6 oz (113.7 kg)  06/20/16 254 lb 3.2 oz (115.3 kg)     Lab Results  Component Value Date   WBC 6.9 06/21/2016   HGB 13.8 06/21/2016   HCT 40.7 06/21/2016   PLT 303.0 06/21/2016   GLUCOSE 278 (H) 09/19/2016   CHOL 157 09/19/2016   TRIG 67.0 09/19/2016   HDL 39.20 09/19/2016   LDLDIRECT 139.3 11/20/2010   LDLCALC 104 (H) 09/19/2016   ALT 61 (H) 09/19/2016   AST 38 (H) 09/19/2016   NA 135 09/19/2016   K 3.6 09/19/2016   CL 103 09/19/2016   CREATININE 0.76 09/19/2016   BUN 11 09/19/2016   CO2 25 09/19/2016   TSH 1.42 09/07/2015   HGBA1C 12.4 (H) 09/07/2015   MICROALBUR 8.1 (H) 09/07/2015    Dg Chest 1 View  Result Date: 07/08/2016 CLINICAL DATA:  Positive PPD, no current chest complaints, nonsmoker. History of hypertension. EXAM: CHEST 1 VIEW COMPARISON:  PA and lateral chest x-ray of March 31, 2016 FINDINGS: The lungs are well-expanded. There is no focal infiltrate. There is no pleural effusion. The heart and pulmonary vascularity are normal. The mediastinum is normal in width. The bony thorax is unremarkable. IMPRESSION: No evidence of acute or old tuberculous infection. There is no active cardiopulmonary disease. Electronically Signed   By: David  SwazilandJordan M.D.   On: 07/08/2016 14:58     Assessment & Plan:  Plan  I have discontinued Ms. Abascal's fluticasone, B-D UF III MINI PEN NEEDLES, BYDUREON, hyoscyamine, benzonatate, repaglinide, and ezetimibe. I am also having her maintain her Insulin NPH (Human) (Isophane), insulin lispro, NIFEdipine, BASAGLAR KWIKPEN, and cetirizine.  No orders of the defined types were placed in this encounter.   Problem List Items Addressed This Visit      Unprioritized   Vaginal itching - Primary    Try rephresh for  vaginal itching and prep H for anal itching Pt has tried several rx creams from derm and gyn with no relief Wet prep done-- results pending       Relevant Orders   Cervicovaginal ancillary only    Other Visit Diagnoses    Need for immunization against influenza       Relevant Orders   Flu Vaccine QUAD 6+ mos IM (Fluarix) (Completed)      Follow-up: Return if symptoms worsen or fail to improve.  Donato SchultzYvonne R Lowne Chase, DO

## 2017-01-31 NOTE — Patient Instructions (Addendum)
Rephresh,  Prep H    Vaginitis Vaginitis is a condition in which the vaginal tissue swells and becomes red (inflamed). This condition is most often caused by a change in the normal balance of bacteria and yeast that live in the vagina. This change causes an overgrowth of certain bacteria or yeast, which causes the inflammation. There are different types of vaginitis, but the most common types are:  Bacterial vaginosis.  Yeast infection (candidiasis).  Trichomoniasis vaginitis. This is a sexually transmitted disease (STD).  Viral vaginitis.  Atrophic vaginitis.  Allergic vaginitis.  What are the causes? The cause of this condition depends on the type of vaginitis. It can be caused by:  Bacteria (bacterial vaginosis).  Yeast, which is a fungus (yeast infection).  A parasite (trichomoniasis vaginitis).  A virus (viral vaginitis).  Low hormone levels (atrophic vaginitis). Low hormone levels can occur during pregnancy, breastfeeding, or after menopause.  Irritants, such as bubble baths, scented tampons, and feminine sprays (allergic vaginitis).  Other factors can change the normal balance of the yeast and bacteria that live in the vagina. These include:  Antibiotic medicines.  Poor hygiene.  Diaphragms, vaginal sponges, spermicides, birth control pills, and intrauterine devices (IUD).  Sex.  Infection.  Uncontrolled diabetes.  A weakened defense (immune) system.  What increases the risk? This condition is more likely to develop in women who:  Smoke.  Use vaginal douches, scented tampons, or scented sanitary pads.  Wear tight-fitting pants.  Wear thong underwear.  Use oral birth control pills or an IUD.  Have sex without a condom.  Have multiple sex partners.  Have an STD.  Frequently use the spermicide nonoxynol-9.  Eat lots of foods high in sugar.  Have uncontrolled diabetes.  Have low estrogen levels.  Have a weakened immune system from an  immune disorder or medical treatment.  Are pregnant or breastfeeding.  What are the signs or symptoms? Symptoms vary depending on the cause of the vaginitis. Common symptoms include:  Abnormal vaginal discharge. ? The discharge is white, gray, or yellow with bacterial vaginosis. ? The discharge is thick, white, and cheesy with a yeast infection. ? The discharge is frothy and yellow or greenish with trichomoniasis.  A bad vaginal smell. The smell is fishy with bacterial vaginosis.  Vaginal itching, pain, or swelling.  Sex that is painful.  Pain or burning when urinating.  Sometimes there are no symptoms. How is this diagnosed? This condition is diagnosed based on your symptoms and medical history. A physical exam, including a pelvic exam, will also be done. You may also have other tests, including:  Tests to determine the pH level (acidity or alkalinity) of your vagina.  A whiff test, to assess the odor that results when a sample of your vaginal discharge is mixed with a potassium hydroxide solution.  Tests of vaginal fluid. A sample will be examined under a microscope.  How is this treated? Treatment varies depending on the type of vaginitis you have. Your treatment may include:  Antibiotic creams or pills to treat bacterial vaginosis and trichomoniasis.  Antifungal medicines, such as vaginal creams or suppositories, to treat a yeast infection.  Medicine to ease discomfort if you have viral vaginitis. Your sexual partner should also be treated.  Estrogen delivered in a cream, pill, suppository, or vaginal ring to treat atrophic vaginitis. If vaginal dryness occurs, lubricants and moisturizing creams may help. You may need to avoid scented soaps, sprays, or douches.  Stopping use of a product that is  causing allergic vaginitis. Then using a vaginal cream to treat the symptoms.  Follow these instructions at home: Lifestyle  Keep your genital area clean and dry. Avoid soap,  and only rinse the area with water.  Do not douche or use tampons until your health care provider says it is okay to do so. Use sanitary pads, if needed.  Do not have sex until your health care provider approves. When you can return to sex, practice safe sex and use condoms.  Wipe from front to back. This avoids the spread of bacteria from the rectum to the vagina. General instructions  Take over-the-counter and prescription medicines only as told by your health care provider.  If you were prescribed an antibiotic medicine, take or use it as told by your health care provider. Do not stop taking or using the antibiotic even if you start to feel better.  Keep all follow-up visits as told by your health care provider. This is important. How is this prevented?  Use mild, non-scented products. Do not use things that can irritate the vagina, such as fabric softeners. Avoid the following products if they are scented: ? Feminine sprays. ? Detergents. ? Tampons. ? Feminine hygiene products. ? Soaps or bubble baths.  Let air reach your genital area. ? Wear cotton underwear to reduce moisture buildup. ? Avoid wearing underwear while you sleep. ? Avoid wearing tight pants and underwear or nylons without a cotton panel. ? Avoid wearing thong underwear.  Take off any wet clothing, such as bathing suits, as soon as possible.  Practice safe sex and use condoms. Contact a health care provider if:  You have abdominal pain.  You have a fever.  You have symptoms that last for more than 2-3 days. Get help right away if:  You have a fever and your symptoms suddenly get worse. Summary  Vaginitis is a condition in which the vaginal tissue becomes inflamed.This condition is most often caused by a change in the normal balance of bacteria and yeast that live in the vagina.  Treatment varies depending on the type of vaginitis you have.  Do not douche, use tampons , or have sex until your health  care provider approves. When you can return to sex, practice safe sex and use condoms. This information is not intended to replace advice given to you by your health care provider. Make sure you discuss any questions you have with your health care provider. Document Released: 01/13/2007 Document Revised: 04/23/2016 Document Reviewed: 04/23/2016 Elsevier Interactive Patient Education  [employer].

## 2017-01-31 NOTE — Assessment & Plan Note (Signed)
Try rephresh for vaginal itching and prep H for anal itching Pt has tried several rx creams from derm and gyn with no relief Wet prep done-- results pending

## 2017-02-04 LAB — CERVICOVAGINAL ANCILLARY ONLY
BACTERIAL VAGINITIS: NEGATIVE
CANDIDA VAGINITIS: NEGATIVE
CHLAMYDIA, DNA PROBE: NEGATIVE
NEISSERIA GONORRHEA: NEGATIVE
TRICH (WINDOWPATH): NEGATIVE

## 2017-02-07 ENCOUNTER — Other Ambulatory Visit: Payer: Self-pay | Admitting: Family Medicine

## 2017-02-08 ENCOUNTER — Other Ambulatory Visit: Payer: Self-pay | Admitting: Family Medicine

## 2017-02-11 ENCOUNTER — Other Ambulatory Visit: Payer: Self-pay

## 2017-02-11 ENCOUNTER — Encounter (HOSPITAL_BASED_OUTPATIENT_CLINIC_OR_DEPARTMENT_OTHER): Payer: Self-pay

## 2017-02-11 ENCOUNTER — Emergency Department (HOSPITAL_BASED_OUTPATIENT_CLINIC_OR_DEPARTMENT_OTHER): Payer: Managed Care, Other (non HMO)

## 2017-02-11 ENCOUNTER — Emergency Department (HOSPITAL_BASED_OUTPATIENT_CLINIC_OR_DEPARTMENT_OTHER)
Admission: EM | Admit: 2017-02-11 | Discharge: 2017-02-11 | Disposition: A | Discharge status: Home / Self Care | Payer: Managed Care, Other (non HMO) | Attending: Emergency Medicine | Admitting: Emergency Medicine

## 2017-02-11 DIAGNOSIS — Z794 Long term (current) use of insulin: Secondary | ICD-10-CM | POA: Diagnosis not present

## 2017-02-11 DIAGNOSIS — I10 Essential (primary) hypertension: Secondary | ICD-10-CM | POA: Insufficient documentation

## 2017-02-11 DIAGNOSIS — R002 Palpitations: Secondary | ICD-10-CM | POA: Diagnosis not present

## 2017-02-11 DIAGNOSIS — Z79899 Other long term (current) drug therapy: Secondary | ICD-10-CM | POA: Insufficient documentation

## 2017-02-11 DIAGNOSIS — E119 Type 2 diabetes mellitus without complications: Secondary | ICD-10-CM | POA: Insufficient documentation

## 2017-02-11 DIAGNOSIS — R0602 Shortness of breath: Secondary | ICD-10-CM | POA: Diagnosis present

## 2017-02-11 LAB — BASIC METABOLIC PANEL
Anion gap: 8 (ref 5–15)
BUN: 10 mg/dL (ref 6–20)
CHLORIDE: 103 mmol/L (ref 101–111)
CO2: 24 mmol/L (ref 22–32)
Calcium: 9.3 mg/dL (ref 8.9–10.3)
Creatinine, Ser: 0.41 mg/dL — ABNORMAL LOW (ref 0.44–1.00)
GFR calc non Af Amer: >60 mL/min (ref >60)
Glucose, Bld: 175 mg/dL — ABNORMAL HIGH (ref 65–99)
POTASSIUM: 3.2 mmol/L — AB (ref 3.5–5.1)
SODIUM: 135 mmol/L (ref 135–145)

## 2017-02-11 LAB — CBC WITH DIFFERENTIAL/PLATELET
Basophils Absolute: 0.1 10*3/uL (ref 0.0–0.1)
Basophils Relative: 1 %
EOS ABS: 0.2 10*3/uL (ref 0.0–0.7)
Eosinophils Relative: 2 %
HEMATOCRIT: 38.8 % (ref 36.0–46.0)
HEMOGLOBIN: 13.5 g/dL (ref 12.0–15.0)
LYMPHS ABS: 3.4 10*3/uL (ref 0.7–4.0)
Lymphocytes Relative: 43 %
MCH: 29.9 pg (ref 26.0–34.0)
MCHC: 34.8 g/dL (ref 30.0–36.0)
MCV: 85.8 fL (ref 78.0–100.0)
MONOS PCT: 7 %
Monocytes Absolute: 0.5 10*3/uL (ref 0.1–1.0)
NEUTROS ABS: 3.8 10*3/uL (ref 1.7–7.7)
NEUTROS PCT: 47 %
Platelets: 245 10*3/uL (ref 150–400)
RBC: 4.52 MIL/uL (ref 3.87–5.11)
RDW: 11.9 % (ref 11.5–15.5)
WBC: 7.9 10*3/uL (ref 4.0–10.5)

## 2017-02-11 LAB — MAGNESIUM: MAGNESIUM: 1.8 mg/dL (ref 1.7–2.4)

## 2017-02-11 NOTE — Discharge Instructions (Signed)
No specific cause for your sensation of palpitations was found during your emergency room visit.  Your EKG and heart monitoring is normal.  Follow-up with your primary care if these episodes persist.

## 2017-02-11 NOTE — ED Triage Notes (Signed)
C/o SOB x 2 days-c/o pain to both legs, face, jaw-NAD-steady gait

## 2017-02-11 NOTE — ED Provider Notes (Signed)
[facility] EMERGENCY DEPARTMENT Provider Note   CSN: [id number] Arrival date & time: 02/11/17  1556     History   Chief Complaint Chief Complaint  Patient presents with  . Shortness of Breath    HPI Kathleen Jordan is a 46 y.o. female. Chief complaint is "shaking feeling in my chest".  HPI:  46 year old female. States she feels a "shaking feeling" growing inside her chest. She was worried it might be heart palpitations. She has history of insulin dependent diabetes for 9 years. No history of heart disease. History of thyroid disorder. No other symptoms. No pain, or syncope, dyspnea, swelling. No fever shakes chills. She states "I wondered if it might be heartburn I get an upset stomach and a bad taste in my mouth at times.  Past Medical History:  Diagnosis Date  . Allergic rhinitis   . Diabetes mellitus    insulin  . Difficult intubation    pt states with breast reduction surgery MD's had difficulty  . Hx of varicella   . Hyperlipidemia   . Hypertension   . Hypopotassemia   . Obesity   . PCOS (polycystic ovarian syndrome)   . Postpartum care following vaginal delivery 11/13/2012  . Postpartum care following vaginal delivery (8/15) 11/13/2012  . Sleep apnea     Patient Active Problem List   Diagnosis Date Noted  . Vaginal itching 01/31/2017  . Preventative health care 06/20/2016  . IBS (irritable bowel syndrome) 09/07/2015  . Knee pain, acute 06/01/2014  . Obesity (BMI 30-39.9) 06/15/2013  . Hypertension complicating pregnancy 11/13/2012  . Postpartum care following vaginal delivery (8/15) 11/13/2012  . OSA (obstructive sleep apnea) 12/06/2010  . Diabetes mellitus (HCC) 11/23/2010  . HEADACHE 12/08/2009  . POLYCYSTIC OVARIES 08/02/2009  . MORBID OBESITY 06/29/2009  . GOITER, SIMPLE 01/10/2009  . DYSPHAGIA UNSPECIFIED 01/10/2009  . ABSCESS, SKIN 11/10/2007  . Hypertension 05/21/2007  . SINUSITIS- ACUTE-NOS 05/21/2007  . Essential hypertension  09/18/2006    Past Surgical History:  Procedure Laterality Date  . BREAST REDUCTION SURGERY  07-2003  . invitro fertilization  11/16/2010   [facility]    OB History    Gravida Para Term Preterm AB Living   3 1 1  0 2 1   SAB TAB Ectopic Multiple Live Births   2 0 0 0 1       Home Medications    Prior to Admission medications   Medication Sig Start Date End Date Taking? Authorizing Provider  cetirizine (ZYRTEC) 10 MG tablet TAKE 1 TABLET(10 MG) BY MOUTH DAILY 09/30/16   Zola Button, Grayling Congress, DO  cetirizine (ZYRTEC) 10 MG tablet TAKE 1 TABLET(10 MG) BY MOUTH DAILY 02/10/17   Zola Button, Grayling Congress, DO  Insulin Glargine (BASAGLAR KWIKPEN) 100 UNIT/ML SOPN Inject 75 Units into the skin 2 (two) times daily.    [provider]  insulin lispro (HUMALOG KWIKPEN) 100 UNIT/ML KiwkPen Inject 0.1 mLs (10 Units total) into the skin as needed. SLIDING SCALE 09/07/15   Zola Button, Myrene Buddy R, DO  Insulin NPH, Human,, Isophane, (HUMULIN N PEN) 100 UNIT/ML Kiwkpen 25 UNITS IN THE Jordan AND 35 UNITS IN THE EVENING 09/07/15   Zola Button, Yvonne R, DO  NIFEdipine (PROCARDIA XL/ADALAT-CC) 60 MG 24 hr tablet TAKE 1 TABLET BY MOUTH DAILY 02/10/17   [name], DO    Family History Family History  Problem Relation Age of Onset  . Arthritis Mother   . Diabetes Mother   .  Hypertension Mother   . Heart disease Mother 1658       MI  . Stroke Mother   . Diabetes Brother   . Hypertension Brother   . Arthritis Maternal Grandmother   . Diabetes Maternal Grandmother   . Hypertension Maternal Grandmother   . Cancer Maternal Grandmother   . Stroke Maternal Grandfather   . Cancer Maternal Grandfather   . Asthma Sister   . Allergies Sister     Social History Social History   Tobacco Use  . Smoking status: Never Smoker  . Smokeless tobacco: Never Used  Substance Use Topics  . Alcohol use: No  . Drug use: No     Allergies   Patient has no known allergies.   Review of  Systems Review of Systems  Constitutional: Negative for appetite change, chills, diaphoresis, fatigue and fever.  HENT: Negative for mouth sores, sore throat and trouble swallowing.   Eyes: Negative for visual disturbance.  Respiratory: Negative for cough, chest tightness, shortness of breath and wheezing.   Cardiovascular: Positive for palpitations. Negative for chest pain.  Gastrointestinal: Negative for abdominal distention, abdominal pain, diarrhea, nausea and vomiting.       "Upset stomach".  Endocrine: Negative for polydipsia, polyphagia and polyuria.  Genitourinary: Negative for dysuria, frequency and hematuria.  Musculoskeletal: Negative for gait problem.  Skin: Negative for color change, pallor and rash.  Neurological: Negative for dizziness, syncope, light-headedness and headaches.  Hematological: Does not bruise/bleed easily.  Psychiatric/Behavioral: Negative for behavioral problems and confusion.     Physical Exam Updated Vital Signs BP (!) 133/93 (BP Location: Right Arm)   Pulse 70   Temp 98 F (36.7 C) (Oral)   Resp 15   Ht 5\' 8"  (1.727 m)   Wt 112.5 kg (248 lb)   LMP 02/06/2017   SpO2 97%   BMI 37.71 kg/m   Physical Exam  Constitutional: She is oriented to person, place, and time. She appears well-developed and well-nourished. No distress.  HENT:  Head: Normocephalic.  Eyes: Conjunctivae are normal. Pupils are equal, round, and reactive to light. No scleral icterus.  Neck: Normal range of motion. Neck supple. No thyromegaly present.  Cardiovascular: Normal rate and regular rhythm. Exam reveals no gallop and no friction rub.  No murmur heard. Normal sinus rhythm on the monitor. As I examine her her pulse is 70 and regular. Palpable regular. Regular sinus on the monitor. She still describes a "shaking feeling". As I place and on her chest I do not feel any thing a vibrating, shaking, spasming, fasciculating, etc.  Pulmonary/Chest: Effort normal and breath sounds  normal. No respiratory distress. She has no wheezes. She has no rales.  Abdominal: Soft. Bowel sounds are normal. She exhibits no distension. There is no tenderness. There is no rebound.  Musculoskeletal: Normal range of motion.  Neurological: She is alert and oriented to person, place, and time.  Skin: Skin is warm and dry. No rash noted.  Psychiatric: She has a normal mood and affect. Her behavior is normal.     ED Treatments / Results  Labs (all labs ordered are listed, but only abnormal results are displayed) Labs Reviewed  BASIC METABOLIC PANEL - Abnormal; Notable for the following components:      Result Value   Potassium 3.2 (*)    Glucose, Bld 175 (*)    Creatinine, Ser 0.41 (*)    All other components within normal limits  CBC WITH DIFFERENTIAL/PLATELET  MAGNESIUM    EKG  EKG  Interpretation None       Radiology Dg Chest 2 View  Result Date: 02/11/2017 CLINICAL DATA:  Cough and shortness of breath.  Chest pain EXAM: CHEST  2 VIEW COMPARISON:  07/08/2016 FINDINGS: The heart size and mediastinal contours are within normal limits. Both lungs are clear. The visualized skeletal structures are unremarkable. IMPRESSION: No active cardiopulmonary disease. Electronically Signed   By: Signa Kellaylor  Stroud M.D.   On: 02/11/2017 16:24    Procedures Procedures (including critical care time)  Medications Ordered in ED Medications - No data to display   Initial Impression / Assessment and Plan / ED Course  I have reviewed the triage vital signs and the nursing notes.  Pertinent labs & imaging results that were available during my care of the patient were reviewed by me and considered in my medical decision making (see chart for details).     Isolation for patient's symptoms here. Reassuring labs. EKG shows sinus rhythm. Patient is reassured. Will follow-up with her primary care physician with persistence.  Final Clinical Impressions(s) / ED Diagnoses   Final diagnoses:   Palpitations    ED Discharge Orders    None       Rolland PorterJames, Nahuel Wilbert, MD 02/11/17 1911

## 2017-02-11 NOTE — ED Notes (Signed)
Patient stated that she felt her heart is fluttering and is short of breath.  Patient also claimed that she is diabetic and had a hurt time controlling her diabetes.    Pt is placed on the monitor and is on NSR at this time.

## 2017-03-12 ENCOUNTER — Encounter: Payer: Self-pay | Admitting: Gastroenterology

## 2017-03-20 ENCOUNTER — Other Ambulatory Visit: Payer: Self-pay | Admitting: Family Medicine

## 2017-04-01 ENCOUNTER — Other Ambulatory Visit: Payer: Self-pay | Admitting: Family Medicine

## 2017-04-02 ENCOUNTER — Encounter: Payer: Self-pay | Admitting: Gastroenterology

## 2017-04-02 ENCOUNTER — Ambulatory Visit: Payer: Managed Care, Other (non HMO) | Admitting: Gastroenterology

## 2017-04-02 VITALS — BP 124/80 | HR 72 | Ht 65.5 in | Wt 251.0 lb

## 2017-04-02 DIAGNOSIS — IMO0001 Reserved for inherently not codable concepts without codable children: Secondary | ICD-10-CM

## 2017-04-02 DIAGNOSIS — K6289 Other specified diseases of anus and rectum: Secondary | ICD-10-CM | POA: Diagnosis not present

## 2017-04-02 DIAGNOSIS — R1030 Lower abdominal pain, unspecified: Secondary | ICD-10-CM

## 2017-04-02 DIAGNOSIS — Z794 Long term (current) use of insulin: Secondary | ICD-10-CM

## 2017-04-02 DIAGNOSIS — K5900 Constipation, unspecified: Secondary | ICD-10-CM

## 2017-04-02 DIAGNOSIS — K625 Hemorrhage of anus and rectum: Secondary | ICD-10-CM

## 2017-04-02 DIAGNOSIS — E119 Type 2 diabetes mellitus without complications: Secondary | ICD-10-CM

## 2017-04-02 DIAGNOSIS — K602 Anal fissure, unspecified: Secondary | ICD-10-CM | POA: Insufficient documentation

## 2017-04-02 MED ORDER — NA SULFATE-K SULFATE-MG SULF 17.5-3.13-1.6 GM/177ML PO SOLN: 1.0000 | Freq: Once | ORAL | 0 refills | Status: AC

## 2017-04-02 MED ORDER — AMBULATORY NON FORMULARY MEDICATION: 0 refills | Status: DC

## 2017-04-02 NOTE — Progress Notes (Signed)
04/02/2017 Kathleen Francesca OmanMarie Jordan [id number] December 25, 1970   HISTORY OF PRESENT ILLNESS: This is a pleasant 47 year old female who is new to our office.  She is here today at the request of Dr. Talmage NapBalan, for evaluation regarding rectal bleeding, rectal pain, constipation, and lower abdominal pain.  She says that recently she has been struggling with a lot of constipation with very large stools.  She has tried over-the-counter stool softeners without much relief.  This causes rectal pain and bleeding described as bright red blood.  She says that this is really been an issue over the past year or so.  She saw Dr. Loreta AveMann in May who she says diagnosed her with a fissure, but just gave her some type of cream to use that did not really help.  Abdominal pain is in her lower abdomen and is intermittent.  CBC in November 2018 was normal.   Past Medical History:  Diagnosis Date  . Allergic rhinitis   . Diabetes mellitus    insulin  . Difficult intubation    pt states with breast reduction surgery MD's had difficulty  . Hx of varicella   . Hyperlipidemia   . Hypertension   . Hypopotassemia   . Obesity   . PCOS (polycystic ovarian syndrome)   . Postpartum care following vaginal delivery 11/13/2012  . Postpartum care following vaginal delivery (8/15) 11/13/2012  . Sleep apnea    Past Surgical History:  Procedure Laterality Date  . BREAST REDUCTION SURGERY  07-2003  . [facility]  [facility]16/2010   [facility]    reports that  has never smoked. she has never used smokeless tobacco. She reports that she does not drink alcohol or use drugs. family history includes Allergies in her sister; Arthritis in her maternal grandmother and mother; Asthma in her sister; Cancer in her maternal grandfather and maternal grandmother; Diabetes in her brother, maternal grandmother, and mother; Heart disease (age of onset: 7958) in her mother; Hypertension in her brother, maternal grandmother, and mother; Stroke in her  maternal grandfather and mother. No Known Allergies    Outpatient Encounter Medications as of 04/02/2017  Medication Sig  . cetirizine (ZYRTEC) 10 MG tablet TAKE 1 TABLET(10 MG) BY MOUTH DAILY  . Docusate Calcium (STOOL SOFTENER PO) Take by mouth.  . ezetimibe (ZETIA) 10 MG tablet   . insulin lispro (HUMALOG KWIKPEN) 100 UNIT/ML KiwkPen Inject 0.1 mLs (10 Units total) into the skin as needed. SLIDING SCALE  . Insulin NPH, Human,, Isophane, (HUMULIN N PEN) 100 UNIT/ML Kiwkpen 25 UNITS IN THE MORNING AND 35 UNITS IN THE EVENING  . NIFEdipine (PROCARDIA XL/ADALAT-CC) 60 MG 24 hr tablet TAKE 1 TABLET BY MOUTH DAILY  . [DISCONTINUED] cetirizine (ZYRTEC) 10 MG tablet TAKE 1 TABLET(10 MG) BY MOUTH DAILY  . [DISCONTINUED] Insulin Glargine (BASAGLAR KWIKPEN) 100 UNIT/ML SOPN Inject 75 Units into the skin 2 (two) times daily.   No facility-administered encounter medications on file as of 04/02/2017.      REVIEW OF SYSTEMS  : All other systems reviewed and negative except where noted in the History of Present Illness.   PHYSICAL EXAM: BP 124/80   Pulse 72   Ht 5' 5.5" (1.664 m)   Wt 251 lb (113.9 kg)   BMI 41.13 kg/m  General: Well developed black female in no acute distress Head: Normocephalic and atraumatic Eyes:  Sclerae anicteric, conjunctiva pink. Ears: Normal auditory acuity Lungs: Clear throughout to auscultation; no increased WOB. Heart: Regular rate and rhythm; no M/R/G.  Abdomen: Soft, non-distended.  BS present.  Non-tender. Rectal:  No external abnormalities noted.  DRE did not reveal any masses but was tender posteriorly and I could feel what I suspect to be an anal fissure.  Anoscopy not attempted. Musculoskeletal: Symmetrical with no gross deformities  Skin: No lesions on visible extremities Extremities: No edema  Neurological: Alert oriented x 4, grossly non-focal Psychological:  Alert and cooperative. Normal mood and affect  ASSESSMENT AND PLAN: *Rectal pain and bleeding:   No definite anal fissure seen on exam today, but highly suspect this due to pain on exam and description of symptoms.  Will treat with nitro gel 0.125% BID for 8-10 weeks.  Need to keep stools soft so I have suggested starting Miralax daily. *Lower abdominal pain, change in bowel habits with constipation:  I think that this is likely some IBS, but with the rectal bleeding I do not think it is a bad idea to schedule a colonoscopy.  Will plan for this several weeks out so that we can give the fissure medication time to work.  Will schedule with Dr. Marina Goodell at [facility] due to previous difficulty with intubation. *IDDM:  Insulin will be adjusted prior to endoscopic procedure per protocol. Will resume normal dosing after procedure.  **The risks, benefits, and alternatives to colonoscopy were discussed with the patient and she consents to proceed.    CC:  Dorisann Frames, MD

## 2017-04-02 NOTE — Patient Instructions (Addendum)
If you are age 45 or older, your body mass index should be between 23-30. Your Body mass index is 41.13 kg/m. If this is out of the aforementioned range listed, please consider follow up with your Primary Care Provider.  If you are age 45 or younger, your body mass index should be between 19-25. Your Body mass index is 41.13 kg/m. If this is out of the aformentioned range listed, please consider follow up with your Primary Care Provider.   We have sent the following medications to your pharmacy for you to pick up at your convenience:  Suprep  Nitroglycerin gel ([place])  Please purchase Desitin and use nightly to perianal area.  You have been scheduled for a colonoscopy. Please follow written instructions given to you at your visit today.  Please pick up your prep supplies at the pharmacy within the next 1-3 days. If you use inhalers (even only as needed), please bring them with you on the day of your procedure. Your physician has requested that you go to www.startemmi.com and enter the access code given to you at your visit today. This web site gives a general overview about your procedure. However, you should still follow specific instructions given to you by our office regarding your preparation for the procedure.

## 2017-04-04 ENCOUNTER — Telehealth: Payer: Self-pay

## 2017-04-04 ENCOUNTER — Other Ambulatory Visit: Payer: Self-pay

## 2017-04-04 DIAGNOSIS — K5900 Constipation, unspecified: Secondary | ICD-10-CM

## 2017-04-04 DIAGNOSIS — K625 Hemorrhage of anus and rectum: Secondary | ICD-10-CM

## 2017-04-04 DIAGNOSIS — K602 Anal fissure, unspecified: Secondary | ICD-10-CM

## 2017-04-04 NOTE — Telephone Encounter (Signed)
[facility] due to intubation problems in the past.  Pt rescheduled to 2/26 with Marina GoodellPerry. New instructions have been printed and will be mailed to pt. Left message for pt to return call.

## 2017-04-06 ENCOUNTER — Other Ambulatory Visit: Payer: Self-pay | Admitting: Family Medicine

## 2017-04-10 NOTE — Progress Notes (Signed)
Assessment and plans for colonoscopy in this high risk patient noted

## 2017-04-24 ENCOUNTER — Telehealth: Payer: Self-pay

## 2017-04-24 ENCOUNTER — Other Ambulatory Visit: Payer: Self-pay

## 2017-04-24 DIAGNOSIS — K625 Hemorrhage of anus and rectum: Secondary | ICD-10-CM

## 2017-04-24 DIAGNOSIS — K602 Anal fissure, unspecified: Secondary | ICD-10-CM

## 2017-04-24 NOTE — Telephone Encounter (Signed)
[facility] Colon rescheduled to 06/24/17 at 8:30am. [facility] Instructions printed. Pt is diabetic. Suprep has already been sent. Orders for surgery have been placed. Left message for pt to return call.

## 2017-04-25 NOTE — Telephone Encounter (Signed)
Pt informed of surgery being re scheduled and that new instructions will be mailed to her home address. She understood.

## 2017-04-25 NOTE — Telephone Encounter (Signed)
Left message for pt to return call.

## 2017-05-05 ENCOUNTER — Other Ambulatory Visit: Payer: Self-pay | Admitting: Family Medicine

## 2017-05-06 ENCOUNTER — Encounter: Payer: Managed Care, Other (non HMO) | Admitting: Internal Medicine

## 2017-05-27 ENCOUNTER — Ambulatory Visit (HOSPITAL_COMMUNITY): Admit: 2017-05-27 | Payer: Managed Care, Other (non HMO) | Admitting: Internal Medicine

## 2017-05-27 ENCOUNTER — Encounter (HOSPITAL_COMMUNITY): Payer: Self-pay

## 2017-05-27 SURGERY — COLONOSCOPY WITH PROPOFOL
Anesthesia: Monitor Anesthesia Care

## 2017-06-13 ENCOUNTER — Telehealth: Payer: Self-pay | Admitting: Internal Medicine

## 2017-06-13 NOTE — Telephone Encounter (Signed)
[facility]@10 :30am, pt aware and knows to arrive there at 9am. New instructions mailed to pt.

## 2017-06-19 ENCOUNTER — Other Ambulatory Visit: Payer: Self-pay | Admitting: Family Medicine

## 2017-06-22 ENCOUNTER — Other Ambulatory Visit: Payer: Self-pay | Admitting: Family Medicine

## 2017-06-27 ENCOUNTER — Other Ambulatory Visit: Payer: Self-pay | Admitting: Obstetrics and Gynecology

## 2017-06-27 ENCOUNTER — Ambulatory Visit
Admission: RE | Admit: 2017-06-27 | Discharge: 2017-06-27 | Disposition: A | Discharge status: Home / Self Care | Payer: Managed Care, Other (non HMO) | Source: Ambulatory Visit | Attending: Obstetrics and Gynecology | Admitting: Obstetrics and Gynecology

## 2017-06-27 DIAGNOSIS — T8389XA Other specified complication of genitourinary prosthetic devices, implants and grafts, initial encounter: Secondary | ICD-10-CM

## 2017-07-01 ENCOUNTER — Other Ambulatory Visit: Payer: Self-pay | Admitting: Endocrinology

## 2017-07-01 DIAGNOSIS — E049 Nontoxic goiter, unspecified: Secondary | ICD-10-CM

## 2017-07-10 ENCOUNTER — Other Ambulatory Visit: Payer: Self-pay

## 2017-07-10 ENCOUNTER — Encounter (HOSPITAL_COMMUNITY): Payer: Self-pay

## 2017-07-11 ENCOUNTER — Ambulatory Visit
Admission: RE | Admit: 2017-07-11 | Discharge: 2017-07-11 | Disposition: A | Discharge status: Home / Self Care | Payer: Managed Care, Other (non HMO) | Source: Ambulatory Visit | Attending: Endocrinology | Admitting: Endocrinology

## 2017-07-11 DIAGNOSIS — E049 Nontoxic goiter, unspecified: Secondary | ICD-10-CM

## 2017-07-15 ENCOUNTER — Ambulatory Visit (HOSPITAL_COMMUNITY)
Admission: RE | Admit: 2017-07-15 | Discharge: 2017-07-15 | Disposition: A | Discharge status: Home / Self Care | Payer: Managed Care, Other (non HMO) | Source: Ambulatory Visit | Attending: Internal Medicine | Admitting: Internal Medicine

## 2017-07-15 ENCOUNTER — Encounter (HOSPITAL_COMMUNITY): Payer: Self-pay

## 2017-07-15 ENCOUNTER — Other Ambulatory Visit: Payer: Self-pay

## 2017-07-15 ENCOUNTER — Ambulatory Visit (HOSPITAL_COMMUNITY): Payer: Managed Care, Other (non HMO) | Admitting: Anesthesiology

## 2017-07-15 ENCOUNTER — Encounter (HOSPITAL_COMMUNITY)
Admission: RE | Disposition: A | Discharge status: Home / Self Care | Payer: Self-pay | Source: Ambulatory Visit | Attending: Internal Medicine

## 2017-07-15 DIAGNOSIS — Z794 Long term (current) use of insulin: Secondary | ICD-10-CM | POA: Diagnosis not present

## 2017-07-15 DIAGNOSIS — E1169 Type 2 diabetes mellitus with other specified complication: Secondary | ICD-10-CM | POA: Insufficient documentation

## 2017-07-15 DIAGNOSIS — I10 Essential (primary) hypertension: Secondary | ICD-10-CM | POA: Insufficient documentation

## 2017-07-15 DIAGNOSIS — K602 Anal fissure, unspecified: Secondary | ICD-10-CM | POA: Insufficient documentation

## 2017-07-15 DIAGNOSIS — Z6841 Body Mass Index (BMI) 40.0 and over, adult: Secondary | ICD-10-CM | POA: Insufficient documentation

## 2017-07-15 DIAGNOSIS — E282 Polycystic ovarian syndrome: Secondary | ICD-10-CM | POA: Insufficient documentation

## 2017-07-15 DIAGNOSIS — K6289 Other specified diseases of anus and rectum: Secondary | ICD-10-CM | POA: Insufficient documentation

## 2017-07-15 DIAGNOSIS — G4730 Sleep apnea, unspecified: Secondary | ICD-10-CM | POA: Diagnosis not present

## 2017-07-15 DIAGNOSIS — Z79899 Other long term (current) drug therapy: Secondary | ICD-10-CM | POA: Diagnosis not present

## 2017-07-15 DIAGNOSIS — E785 Hyperlipidemia, unspecified: Secondary | ICD-10-CM | POA: Diagnosis not present

## 2017-07-15 DIAGNOSIS — K625 Hemorrhage of anus and rectum: Secondary | ICD-10-CM | POA: Diagnosis not present

## 2017-07-15 HISTORY — DX: Anal fissure, unspecified: K60.2

## 2017-07-15 HISTORY — DX: Gastro-esophageal reflux disease without esophagitis: K21.9

## 2017-07-15 HISTORY — PX: COLONOSCOPY WITH PROPOFOL: SHX5780

## 2017-07-15 LAB — GLUCOSE, CAPILLARY: GLUCOSE-CAPILLARY: 242 mg/dL — AB (ref 65–99)

## 2017-07-15 SURGERY — COLONOSCOPY WITH PROPOFOL
Anesthesia: Monitor Anesthesia Care

## 2017-07-15 MED ORDER — DILTIAZEM GEL 2 %: CUTANEOUS | 2 refills | Status: DC

## 2017-07-15 MED ORDER — PROPOFOL 500 MG/50ML IV EMUL
INTRAVENOUS | Status: DC | PRN
  Administered 2017-07-15: 125 ug/kg/min via INTRAVENOUS

## 2017-07-15 MED ORDER — LACTATED RINGERS IV SOLN
INTRAVENOUS | Status: DC
  Administered 2017-07-15: 1000 mL via INTRAVENOUS

## 2017-07-15 MED ORDER — PROPOFOL 10 MG/ML IV BOLUS
INTRAVENOUS | Status: AC
  Filled 2017-07-15: qty 40

## 2017-07-15 MED ORDER — PROPOFOL 10 MG/ML IV BOLUS
INTRAVENOUS | Status: DC | PRN
  Administered 2017-07-15 (×2): 20 mg via INTRAVENOUS
  Administered 2017-07-15: 30 mg via INTRAVENOUS

## 2017-07-15 SURGICAL SUPPLY — 22 items

## 2017-07-15 NOTE — Transfer of Care (Signed)
Immediate Anesthesia Transfer of Care Note  Patient: Kathleen Jordan  Procedure(s) Performed: COLONOSCOPY WITH PROPOFOL (N/A )  Patient Location: PACU  Anesthesia Type:MAC  Level of Consciousness: sedated  Airway & Oxygen Therapy: Patient Spontanous Breathing and Patient connected to nasal cannula oxygen  Post-op Assessment: Report given to RN and Post -op Vital signs reviewed and stable  Post vital signs: Reviewed and stable  Last Vitals:  Vitals Value Taken Time  BP    Temp    Pulse    Resp    SpO2      Last Pain:  Vitals:   07/15/17 0942  TempSrc: Oral         Complications: No apparent anesthesia complications

## 2017-07-15 NOTE — Op Note (Signed)
[facility] Patient Name: Kathleen Jordan Procedure Date: 07/15/2017 MRN: [id number] Attending MD: Wilhemina BonitoJohn N. Marina GoodellPerry , MD Date of Birth: October 17, 1970 CSN: [id number] Age: [age] Admit Type: Outpatient Procedure:                Colonoscopy Indications:              Rectal bleeding Providers:                Wilhemina BonitoJohn N. Marina GoodellPerry, MD, Janae SauceKaren D. Steele BergHinson, RN, Kandice RobinsonsGuillaume                            [place], Technician Referring MD:              Medicines:                Monitored Anesthesia Care Complications:            No immediate complications. Estimated blood loss:                            None. Estimated Blood Loss:     Estimated blood loss: none. Procedure:                Pre-Anesthesia Assessment:                           - Prior to the procedure, a History and Physical                            was performed, and patient medications and                            allergies were reviewed. The patient's tolerance of                            previous anesthesia was also reviewed. The risks                            and benefits of the procedure and the sedation                            options and risks were discussed with the patient.                            All questions were answered, and informed consent                            was obtained. Prior Anticoagulants: The patient has                            taken no previous anticoagulant or antiplatelet                            agents. ASA Grade Assessment: II - A patient with                            mild systemic disease.  After reviewing the risks                            and benefits, the patient was deemed in                            satisfactory condition to undergo the procedure.                           After obtaining informed consent, the colonoscope                            was passed under direct vision. Throughout the                            procedure, the patient's blood pressure, pulse, and                            oxygen saturations were monitored continuously. The                            EC-[id number] ([id number]) scope was introduced through                            the anus and advanced to the the cecum, identified                            by appendiceal orifice and ileocecal valve. The                            ileocecal valve, appendiceal orifice, and rectum                            were photographed. The quality of the bowel                            preparation was excellent. The colonoscopy was                            performed without difficulty. The patient tolerated                            the procedure well. The bowel preparation used was                            SUPREP. Scope In: 11:05:25 AM Scope Out: 11:20:04 AM Scope Withdrawal Time: 0 hours 8 minutes 56 seconds  Total Procedure Duration: 0 hours 14 minutes 39 seconds  Findings:      The entire examined colon appeared normal on direct and retroflexion       views. Impression:               - The entire examined colon is normal on direct and  retroflexion views.                           - Anal fissure. Moderate Sedation:      none Recommendation:           - Repeat colonoscopy in 10 years for screening                            purposes.                           - Metamucil 2 tablespoons daily and 14 ounces of                            water. This will improve the stool consistency.                            This is very important.                           - Prescribe 2% diltiazem gel. Several quantities.                            Apply to anus 5 times daily as instructed, for 6                            weeks.                           - If problems persist despite the above measures,                            then surgical consultation or consideration of                            lateral sphincterotomy. Procedure Code(s):        --- Professional ---                            [phone], Colonoscopy, flexible; diagnostic, including                            collection of specimen(s) by brushing or washing,                            when performed (separate procedure) Diagnosis Code(s):        --- Professional ---                           K62.5, Hemorrhage of anus and rectum CPT copyright 2017 [employer]. All rights reserved. The codes documented in this report are preliminary and upon coder review may  be revised to meet current compliance requirements. Wilhemina Bonito. Marina Goodell, MD 07/15/2017 11:26:24 AM This report has been signed electronically. Number of Addenda: 0

## 2017-07-15 NOTE — H&P (Signed)
HISTORY OF PRESENT ILLNESS: This is a pleasant 47 year old female who is new to our office.  She is here today at the request of Dr. Talmage NapBalan, for evaluation regarding rectal bleeding, rectal pain, constipation, and lower abdominal pain.  She says that recently she has been struggling with a lot of constipation with very large stools.  She has tried over-the-counter stool softeners without much relief.  This causes rectal pain and bleeding described as bright red blood.  She says that this is really been an issue over the past year or so.  She saw Dr. Loreta AveMann in May who she says diagnosed her with a fissure, but just gave her some type of cream to use that did not really help.  Abdominal pain is in her lower abdomen and is intermittent.  CBC in November 2018 was normal.       Past Medical History:  Diagnosis Date  . Allergic rhinitis   . Diabetes mellitus    insulin  . Difficult intubation    pt states with breast reduction surgery MD's had difficulty  . Hx of varicella   . Hyperlipidemia   . Hypertension   . Hypopotassemia   . Obesity   . PCOS (polycystic ovarian syndrome)   . Postpartum care following vaginal delivery 11/13/2012  . Postpartum care following vaginal delivery (8/15) 11/13/2012  . Sleep apnea         Past Surgical History:  Procedure Laterality Date  . BREAST REDUCTION SURGERY  07-2003  . [facility]    reports that  has never smoked. she has never used smokeless tobacco. She reports that she does not drink alcohol or use drugs. family history includes Allergies in her sister; Arthritis in her maternal grandmother and mother; Asthma in her sister; Cancer in her maternal grandfather and maternal grandmother; Diabetes in her brother, maternal grandmother, and mother; Heart disease (age of onset: 6558) in her mother; Hypertension in her brother, maternal grandmother, and mother; Stroke in her maternal grandfather and mother. No  Known Allergies    Outpatient Encounter Medications as of 04/02/2017  Medication Sig  . cetirizine (ZYRTEC) 10 MG tablet TAKE 1 TABLET(10 MG) BY MOUTH DAILY  . Docusate Calcium (STOOL SOFTENER PO) Take by mouth.  . ezetimibe (ZETIA) 10 MG tablet   . insulin lispro (HUMALOG KWIKPEN) 100 UNIT/ML KiwkPen Inject 0.1 mLs (10 Units total) into the skin as needed. SLIDING SCALE  . Insulin NPH, Human,, Isophane, (HUMULIN N PEN) 100 UNIT/ML Kiwkpen 25 UNITS IN THE MORNING AND 35 UNITS IN THE EVENING  . NIFEdipine (PROCARDIA XL/ADALAT-CC) 60 MG 24 hr tablet TAKE 1 TABLET BY MOUTH DAILY  . [DISCONTINUED] cetirizine (ZYRTEC) 10 MG tablet TAKE 1 TABLET(10 MG) BY MOUTH DAILY  . [DISCONTINUED] Insulin Glargine (BASAGLAR KWIKPEN) 100 UNIT/ML SOPN Inject 75 Units into the skin 2 (two) times daily.   No facility-administered encounter medications on file as of 04/02/2017.      REVIEW OF SYSTEMS  : All other systems reviewed and negative except where noted in the History of Present Illness.   PHYSICAL EXAM: BP 124/80   Pulse 72   Ht 5' 5.5" (1.664 m)   Wt 251 lb (113.9 kg)   BMI 41.13 kg/m  General: Well developed black female in no acute distress Head: Normocephalic and atraumatic Eyes:  Sclerae anicteric, conjunctiva pink. Ears: Normal auditory acuity Lungs: Clear throughout to auscultation; no increased WOB. Heart: Regular rate and rhythm; no M/R/G. Abdomen: Soft,  non-distended.  BS present.  Non-tender. Rectal:  No external abnormalities noted.  DRE did not reveal any masses but was tender posteriorly and I could feel what I suspect to be an anal fissure.  Anoscopy not attempted. Musculoskeletal: Symmetrical with no gross deformities  Skin: No lesions on visible extremities Extremities: No edema  Neurological: Alert oriented x 4, grossly non-focal Psychological:  Alert and cooperative. Normal mood and affect  ASSESSMENT AND PLAN: *Rectal pain and bleeding:  No definite anal fissure  seen on exam today, but highly suspect this due to pain on exam and description of symptoms.  Will treat with nitro gel 0.125% BID for 8-10 weeks.  Need to keep stools soft so I have suggested starting Miralax daily. *Lower abdominal pain, change in bowel habits with constipation:  I think that this is likely some IBS, but with the rectal bleeding I do not think it is a bad idea to schedule a colonoscopy.  Will plan for this several weeks out so that we can give the fissure medication time to work.  Will schedule with Dr. Marina Goodell at [facility] hospital due to previous difficulty with intubation. *IDDM:  Insulin will be adjusted prior to endoscopic procedure per protocol. Will resume normal dosing after procedure.  **The risks, benefits, and alternatives to colonoscopy were discussed with the patient and she consents to proceed.     GI ATTENDING  Interval history and data reviewed. Patient seen and examined bedside. No significant interval change. Now for colonoscopy to evaluate rectal bleeding.The nature of the procedure, as well as the risks, benefits, and alternatives were carefully and thoroughly reviewed with the patient. Ample time for discussion and questions allowed. The patient understood, was satisfied, and agreed to proceed.  Wilhemina Bonito. Eda Keys., M.D. [facility] Division of Gastroenterology

## 2017-07-15 NOTE — Discharge Instructions (Signed)

## 2017-07-15 NOTE — Anesthesia Postprocedure Evaluation (Signed)
Anesthesia Post Note  Patient: Leonard Hendler  Procedure(s) Performed: COLONOSCOPY WITH PROPOFOL (N/A )     Patient location during evaluation: PACU Anesthesia Type: MAC Level of consciousness: awake and alert Pain management: pain level controlled Vital Signs Assessment: post-procedure vital signs reviewed and stable Respiratory status: spontaneous breathing, nonlabored ventilation, respiratory function stable and patient connected to nasal cannula oxygen Cardiovascular status: stable and blood pressure returned to baseline Postop Assessment: no apparent nausea or vomiting Anesthetic complications: no    Last Vitals:  Vitals:   07/15/17 1141 07/15/17 1147  BP: 117/79 124/82  Pulse: 66   Resp: 18   Temp:    SpO2: 100%     Last Pain:  Vitals:   07/15/17 1147  TempSrc:   PainSc: 0-No pain                 Antonique Langford S

## 2017-07-15 NOTE — Anesthesia Preprocedure Evaluation (Signed)
Anesthesia Evaluation  Patient identified by MRN, date of birth, ID band Patient awake    Reviewed: Allergy & Precautions, NPO status , Patient's Chart, lab work & pertinent test results  Airway Mallampati: III  TM Distance: <3 FB Neck ROM: Full    Dental no notable dental hx.    Pulmonary neg pulmonary ROS, sleep apnea ,    Pulmonary exam normal breath sounds clear to auscultation       Cardiovascular hypertension, Normal cardiovascular exam Rhythm:Regular Rate:Normal     Neuro/Psych negative neurological ROS  negative psych ROS   GI/Hepatic negative GI ROS, Neg liver ROS,   Endo/Other  diabetes, Insulin DependentMorbid obesity  Renal/GU negative Renal ROS  negative genitourinary   Musculoskeletal negative musculoskeletal ROS (+)   Abdominal   Peds negative pediatric ROS (+)  Hematology negative hematology ROS (+)   Anesthesia Other Findings   Reproductive/Obstetrics negative OB ROS                             Anesthesia Physical Anesthesia Plan  ASA: III  Anesthesia Plan: MAC   Post-op Pain Management:    Induction: Intravenous  PONV Risk Score and Plan: 0  Airway Management Planned: Simple Face Mask  Additional Equipment:   Intra-op Plan:   Post-operative Plan:   Informed Consent: I have reviewed the patients History and Physical, chart, labs and discussed the procedure including the risks, benefits and alternatives for the proposed anesthesia with the patient or authorized representative who has indicated his/her understanding and acceptance.   Dental advisory given  Plan Discussed with: CRNA and Surgeon  Anesthesia Plan Comments:         Anesthesia Quick Evaluation

## 2017-07-16 ENCOUNTER — Encounter (HOSPITAL_COMMUNITY): Payer: Self-pay | Admitting: Internal Medicine

## 2017-07-22 ENCOUNTER — Other Ambulatory Visit: Payer: Self-pay | Admitting: Obstetrics and Gynecology

## 2017-07-24 ENCOUNTER — Other Ambulatory Visit: Payer: Self-pay | Admitting: Family Medicine

## 2017-07-24 ENCOUNTER — Ambulatory Visit: Payer: Managed Care, Other (non HMO) | Admitting: Family Medicine

## 2017-07-24 ENCOUNTER — Encounter: Payer: Self-pay | Admitting: Family Medicine

## 2017-07-24 VITALS — BP 126/70 | HR 86 | Temp 98.0°F | Resp 16 | Ht 65.5 in | Wt 249.4 lb

## 2017-07-24 DIAGNOSIS — K219 Gastro-esophageal reflux disease without esophagitis: Secondary | ICD-10-CM | POA: Insufficient documentation

## 2017-07-24 DIAGNOSIS — R1012 Left upper quadrant pain: Secondary | ICD-10-CM

## 2017-07-24 DIAGNOSIS — K5901 Slow transit constipation: Secondary | ICD-10-CM

## 2017-07-24 LAB — COMPREHENSIVE METABOLIC PANEL
ALK PHOS: 89 U/L (ref 39–117)
ALT: 55 U/L — ABNORMAL HIGH (ref 0–35)
AST: 35 U/L (ref 0–37)
Albumin: 4.2 g/dL (ref 3.5–5.2)
BILIRUBIN TOTAL: 0.3 mg/dL (ref 0.2–1.2)
BUN: 13 mg/dL (ref 6–23)
CO2: 26 mEq/L (ref 19–32)
Calcium: 9.5 mg/dL (ref 8.4–10.5)
Chloride: 100 mEq/L (ref 96–112)
Creatinine, Ser: 0.75 mg/dL (ref 0.40–1.20)
GFR: 107.67 mL/min (ref >60.00)
GLUCOSE: 445 mg/dL — AB (ref 70–99)
Potassium: 4 mEq/L (ref 3.5–5.1)
SODIUM: 133 meq/L — AB (ref 135–145)
TOTAL PROTEIN: 7.7 g/dL (ref 6.0–8.3)

## 2017-07-24 LAB — CBC WITH DIFFERENTIAL/PLATELET
Basophils Absolute: 0 10*3/uL (ref 0.0–0.1)
Basophils Relative: 0.6 % (ref 0.0–3.0)
EOS ABS: 0.2 10*3/uL (ref 0.0–0.7)
Eosinophils Relative: 2.7 % (ref 0.0–5.0)
HCT: 40.2 % (ref 36.0–46.0)
Hemoglobin: 13.8 g/dL (ref 12.0–15.0)
LYMPHS ABS: 2.8 10*3/uL (ref 0.7–4.0)
Lymphocytes Relative: 42.2 % (ref 12.0–46.0)
MCHC: 34.3 g/dL (ref 30.0–36.0)
MCV: 86.4 fl (ref 78.0–100.0)
MONO ABS: 0.3 10*3/uL (ref 0.1–1.0)
Monocytes Relative: 4.7 % (ref 3.0–12.0)
NEUTROS PCT: 49.8 % (ref 43.0–77.0)
Neutro Abs: 3.3 10*3/uL (ref 1.4–7.7)
Platelets: 284 10*3/uL (ref 150.0–400.0)
RBC: 4.65 Mil/uL (ref 3.87–5.11)
RDW: 12.7 % (ref 11.5–15.5)
WBC: 6.6 10*3/uL (ref 4.0–10.5)

## 2017-07-24 MED ORDER — RANITIDINE HCL 150 MG PO TABS: 150.0000 mg | ORAL_TABLET | Freq: Two times a day (BID) | ORAL | 2 refills | Status: DC

## 2017-07-24 NOTE — Assessment & Plan Note (Addendum)
Drink plenty of water  Increase fiber in diet Can use miralax prn F/u GI

## 2017-07-24 NOTE — Progress Notes (Signed)
Patient ID: Kathleen Jordan, female   DOB: 22-Sep-1970, 47 y.o.   MRN: [id number]     Subjective:  I acted as a Neurosurgeon for Dr. Zola Jordan.  Kathleen Jordan, CMA   Patient ID: Kathleen Jordan, female    DOB: [birth year], 47 y.o.   MRN: [id number]  Chief Complaint  Patient presents with  . Abdominal Pain    Abdominal Pain  This is a new problem. Episode onset: friday. The problem occurs intermittently. The pain is located in the LUQ. The quality of the pain is sharp. The abdominal pain does not radiate. Associated symptoms include constipation and nausea. Pertinent negatives include no belching, diarrhea, dysuria, fever, flatus, frequency, headaches, hematochezia, hematuria, melena or vomiting. The pain is aggravated by eating (after meals). The pain is relieved by being still. She has tried nothing for the symptoms.    Patient is in today for abdominal and side pain.  Patient Care Team: Kathleen Jordan, Kathleen Congress, DO as PCP - General Kathleen Frames, MD as Consulting Physician (Endocrinology) Kathleen Hun, MD as Consulting Physician (Obstetrics and Gynecology)   Past Medical History:  Diagnosis Date  . Allergic rhinitis   . Complication of anesthesia    sleep apnea  no mask cant afford it  . Diabetes mellitus    insulin  type 2  . Difficult intubation    pt states with breast reduction surgery MD's had difficulty  2004  . GERD (gastroesophageal reflux disease)   . Hx of varicella   . Hyperlipidemia   . Hypertension   . Hypopotassemia   . Obesity   . Palpitations   . PCOS (polycystic ovarian syndrome)   . Postpartum care following vaginal delivery 11/13/2012  . Postpartum care following vaginal delivery (8/15) 11/13/2012  . Sleep apnea    no mask    Past Surgical History:  Procedure Laterality Date  . BREAST REDUCTION SURGERY  07-2003  . COLONOSCOPY WITH PROPOFOL N/A 07/15/2017   Procedure: COLONOSCOPY WITH PROPOFOL;  Surgeon: Kathleen Fredrickson, MD;  Location: [facility]  ENDOSCOPY;  Service: Endoscopy;  Laterality: N/A;  . invitro fertilization  11/16/2010   [facility]    Family History  Problem Relation Age of Onset  . Arthritis Mother   . Diabetes Mother   . Hypertension Mother   . Heart disease Mother 4       MI  . Stroke Mother   . Diabetes Brother   . Hypertension Brother   . Arthritis Maternal Grandmother   . Diabetes Maternal Grandmother   . Hypertension Maternal Grandmother   . Cancer Maternal Grandmother   . Stroke Maternal Grandfather   . Cancer Maternal Grandfather   . Asthma Sister   . Allergies Sister     Social History   Socioeconomic History  . Marital status: Married    Spouse name: Kathleen Jordan  . Number of children: 1  . Years of education: Masters  . Highest education level: Not on file  Occupational History  . 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 Financial resource strain: Not on file  . Food insecurity:    Worry: Not on file    Inability: Not on file  . Transportation needs:    Medical: Not on file    Non-medical: Not on file  Tobacco Use  . Smoking status: Never Smoker  . Smokeless tobacco: Never Used  Substance and Sexual Activity  .  Alcohol use: No  . Drug use: No  . Sexual activity: Yes    Partners: Male    Birth control/protection: None  Lifestyle  . Physical activity:    Days per week: Not on file    Minutes per session: Not on file  . Stress: Not on file  Relationships  . Social connections:    Talks on phone: Not on file    Gets together: Not on file    Attends religious service: Not on file    Active member of club or organization: Not on file    Attends meetings of clubs or organizations: Not on file    Relationship status: Not on file  . Intimate partner violence:    Fear of current or ex partner: Not on file    Emotionally abused: Not on file    Physically abused: Not on file    Forced sexual  activity: Not on file  Other Topics Concern  . Not on file  Social History Narrative   Drinks caffeine 2x a week     Outpatient Medications Prior to Visit  Medication Sig Dispense Refill  . cetirizine (ZYRTEC) 10 MG tablet TAKE 1 TABLET(10 MG) BY MOUTH DAILY 30 tablet 0  . ezetimibe (ZETIA) 10 MG tablet Take 10 mg by mouth daily.   3  . fluticasone (FLONASE) 50 MCG/ACT nasal spray Place 2 sprays into both nostrils 2 (two) times daily.    . insulin lispro (HUMALOG KWIKPEN) 100 UNIT/ML KiwkPen Inject 0.1 mLs (10 Units total) into the skin as needed. SLIDING SCALE (Patient taking differently: Inject 40 Units into the skin 2 (two) times daily. ) 15 mL   . Insulin NPH, Human,, Isophane, (HUMULIN N PEN) 100 UNIT/ML Kiwkpen 25 UNITS IN THE MORNING AND 35 UNITS IN THE EVENING (Patient taking differently: Inject 75 Units into the skin 2 (two) times daily. ) 15 mL   . NIFEdipine (PROCARDIA XL/ADALAT-CC) 60 MG 24 hr tablet TAKE 1 TABLET BY MOUTH DAILY (Patient taking differently: TAKE 60 MG BY MOUTH DAILY) 90 tablet 0  . repaglinide (PRANDIN) 1 MG tablet Take 1 mg by mouth daily with lunch.    . sitaGLIPtin (JANUVIA) 100 MG tablet Take 100 mg by mouth daily.    Marland Kitchen. spironolactone (ALDACTONE) 50 MG tablet Take 50 mg by mouth daily.    . AMBULATORY NON FORMULARY MEDICATION Medication Name: Nitroglycerin gel 0.125%  Apply twice daily for 8-10 weeks ( Place inside rectum ) (Patient not taking: Reported on 07/10/2017) 1 Tube 0  . diltiazem 2 % GEL Apply pea sized amount to anus 5 times daily for 6 weeks 30 g 2  . ibuprofen (ADVIL,MOTRIN) 200 MG tablet Take 600 mg by mouth every 6 (six) hours as needed for headache or moderate pain.    Marland Kitchen. terconazole (TERAZOL 7) 0.4 % vaginal cream Place 1 applicator vaginally every Thursday.     No facility-administered medications prior to visit.     No Known Allergies  Review of Systems  Constitutional: Negative for fever and malaise/fatigue.  HENT: Negative for  congestion.   Eyes: Negative for blurred vision.  Respiratory: Negative for cough and shortness of breath.   Cardiovascular: Negative for chest pain, palpitations and leg swelling.  Gastrointestinal: Positive for abdominal pain, constipation and nausea. Negative for diarrhea, flatus, hematochezia, melena and vomiting.  Genitourinary: Negative for dysuria, frequency and hematuria.  Musculoskeletal: Negative for back pain.  Skin: Negative for rash.  Neurological: Negative for loss of consciousness and  headaches.       Objective:    Physical Exam  Constitutional: She is oriented to person, place, and time. She appears well-developed and well-nourished.  HENT:  Head: Normocephalic and atraumatic.  Eyes: Conjunctivae and EOM are normal.  Neck: Normal range of motion. Neck supple. No JVD present. Carotid bruit is not present. No thyromegaly present.  Cardiovascular: Normal rate, regular rhythm and normal heart sounds.  No murmur heard. Pulmonary/Chest: Effort normal and breath sounds normal. No respiratory distress. She has no wheezes. She has no rales. She exhibits no tenderness.  Musculoskeletal: She exhibits no edema.  Neurological: She is alert and oriented to person, place, and time.  Psychiatric: She has a normal mood and affect.  Nursing note and vitals reviewed.   BP 126/70 (BP Location: Left Arm, Cuff Size: Normal)   Pulse 86   Temp 98 F (36.7 C) (Oral)   Resp 16   Ht 5' 5.5" (1.664 m)   Wt 249 lb 6.4 oz (113.1 kg)   LMP 07/17/2017   SpO2 97%   BMI 40.87 kg/m  Wt Readings from Last 3 Encounters:  07/24/17 249 lb 6.4 oz (113.1 kg)  07/15/17 251 lb (113.9 kg)  04/02/17 251 lb (113.9 kg)   BP Readings from Last 3 Encounters:  07/24/17 126/70  07/15/17 124/82  04/02/17 124/80     Immunization History  Administered Date(s) Administered  . Influenza Split 01/10/2011, 01/31/2012  . Influenza Whole 01/01/2010  . Influenza,inj,Quad PF,6+ Mos 12/24/2012, 02/28/2014,  06/20/2016, 01/31/2017  . PPD Test 07/25/2010, 06/20/2014, 06/21/2016, 06/25/2016  . Pneumococcal Polysaccharide-23 02/28/2014  . Td 04/01/2000  . Tdap 11/02/2012    Health Maintenance  Topic Date Due  . HEMOGLOBIN A1C  03/08/2016  . OPHTHALMOLOGY EXAM  05/16/2016  . URINE MICROALBUMIN  09/06/2016  . PAP SMEAR  12/28/2016  . FOOT EXAM  06/20/2017  . INFLUENZA VACCINE  10/30/2017  . PNEUMOCOCCAL POLYSACCHARIDE VACCINE (2) 03/01/2019  . TETANUS/TDAP  11/03/2022  . HIV Screening  Completed    Lab Results  Component Value Date   WBC 6.6 07/24/2017   HGB 13.8 07/24/2017   HCT 40.2 07/24/2017   PLT 284.0 07/24/2017   GLUCOSE 445 (H) 07/24/2017   CHOL 157 09/19/2016   TRIG 67.0 09/19/2016   HDL 39.20 09/19/2016   LDLDIRECT 139.3 11/20/2010   LDLCALC 104 (H) 09/19/2016   ALT 55 (H) 07/24/2017   AST 35 07/24/2017   NA 133 (L) 07/24/2017   K 4.0 07/24/2017   CL 100 07/24/2017   CREATININE 0.75 07/24/2017   BUN 13 07/24/2017   CO2 26 07/24/2017   TSH 1.42 09/07/2015   HGBA1C 12.4 (H) 09/07/2015   MICROALBUR 8.1 (H) 09/07/2015    Lab Results  Component Value Date   TSH 1.42 09/07/2015   Lab Results  Component Value Date   WBC 6.6 07/24/2017   HGB 13.8 07/24/2017   HCT 40.2 07/24/2017   MCV 86.4 07/24/2017   PLT 284.0 07/24/2017   Lab Results  Component Value Date   NA 133 (L) 07/24/2017   K 4.0 07/24/2017   CO2 26 07/24/2017   GLUCOSE 445 (H) 07/24/2017   BUN 13 07/24/2017   CREATININE 0.75 07/24/2017   BILITOT 0.3 07/24/2017   ALKPHOS 89 07/24/2017   AST 35 07/24/2017   ALT 55 (H) 07/24/2017   PROT 7.7 07/24/2017   ALBUMIN 4.2 07/24/2017   CALCIUM 9.5 07/24/2017   ANIONGAP 8 02/11/2017   GFR 107.67 07/24/2017  Lab Results  Component Value Date   CHOL 157 09/19/2016   Lab Results  Component Value Date   HDL 39.20 09/19/2016   Lab Results  Component Value Date   LDLCALC 104 (H) 09/19/2016   Lab Results  Component Value Date   TRIG 67.0  09/19/2016   Lab Results  Component Value Date   CHOLHDL 4 09/19/2016   Lab Results  Component Value Date   HGBA1C 12.4 (H) 09/07/2015         Assessment & Plan:   Problem List Items Addressed This Visit      Unprioritized   Constipation - Primary    Drink plenty of water  Increase fiber in diet Can use miralax prn F/u GI       Gastroesophageal reflux disease    Zantac bid  gerd diet F/u GI prn      Relevant Medications   ranitidine (ZANTAC) 150 MG tablet    Other Visit Diagnoses    Left upper quadrant pain       Relevant Orders   CBC with Differential/Platelet (Completed)   Comprehensive metabolic panel (Completed)      I have discontinued Alanni M. Kaster's AMBULATORY NON FORMULARY MEDICATION, terconazole, ibuprofen, and diltiazem. I am also having her start on ranitidine. Additionally, I am having her maintain her Insulin NPH (Human) (Isophane), insulin lispro, ezetimibe, NIFEdipine, repaglinide, sitaGLIPtin, spironolactone, fluticasone, and cetirizine.  Meds ordered this encounter  Medications  . ranitidine (ZANTAC) 150 MG tablet    Sig: Take 1 tablet (150 mg total) by mouth 2 (two) times daily.    Dispense:  60 tablet    Refill:  2    CMA served as scribe during this visit. History, Physical and Plan performed by medical provider. Documentation and orders reviewed and attested to.  [name], DO

## 2017-07-24 NOTE — Patient Instructions (Signed)

## 2017-07-24 NOTE — Assessment & Plan Note (Signed)
Zantac bid  gerd diet F/u GI prn

## 2017-08-04 ENCOUNTER — Other Ambulatory Visit: Payer: Self-pay | Admitting: Obstetrics and Gynecology

## 2017-08-07 ENCOUNTER — Other Ambulatory Visit: Payer: Self-pay

## 2017-08-07 ENCOUNTER — Encounter (HOSPITAL_BASED_OUTPATIENT_CLINIC_OR_DEPARTMENT_OTHER): Payer: Self-pay | Admitting: *Deleted

## 2017-08-07 NOTE — Progress Notes (Signed)
SPOKE W/ PT VIA PHONE FOR PRE-OP INTERVIEW.  NPO AFTER MN.  ARRIVE AT [id number].  NEEDS URINE PREG.  CURRENT EKG IN CHART AND Epic.  CURRENT LAB RESULTS, DATED 07-24-2017, IN CHART AND Epic.  WILL TAKE ZANTAC AM DOS W/ SIPS OF WATER.

## 2017-08-13 ENCOUNTER — Ambulatory Visit (HOSPITAL_BASED_OUTPATIENT_CLINIC_OR_DEPARTMENT_OTHER): Payer: Managed Care, Other (non HMO) | Admitting: Anesthesiology

## 2017-08-13 DIAGNOSIS — N920 Excessive and frequent menstruation with regular cycle: Secondary | ICD-10-CM | POA: Diagnosis present

## 2017-08-13 DIAGNOSIS — N8501 Benign endometrial hyperplasia: Secondary | ICD-10-CM | POA: Diagnosis present

## 2017-08-13 NOTE — H&P (Signed)
Kathleen Jordan is an 47 y.o. female. Who presents for endometrial ablation for menorrhagia and Mirena IUD placement for contraception  Pertinent Gynecological History: Menses: flow is excessive with use of 16 pads or tampons on heaviest days Bleeding: reg menses, but heavy with soiling of clothes and sheets Contraception: none and IUD expelled DES exposure: unknown Blood transfusions: none Sexually transmitted diseases: no past history Previous GYN Procedures: none  Last mammogram: normal Date: 2019 Last pap: normal [id number] OB History: G1, P1   Menstrual History: Menarche age: 50 Patient's last menstrual period was 07/17/2017.    Past Medical History:  Diagnosis Date  . Allergic rhinitis   . Anal fissure 07/15/2017  . Chronic constipation   . Frequency of urination   . GERD (gastroesophageal reflux disease)   . History of palpitations   . Hx of varicella   . Hyperlipidemia   . Hypertension   . Menorrhagia   . OSA (obstructive sleep apnea)    08-07-2017 no cpap due to finanaces  . PCOS (polycystic ovarian syndrome)   . Peripheral neuropathy   . Thyroid goiter   . Type 2 diabetes mellitus with hyperglycemia, with long-term current use of insulin [facility])    endocrinologist-  dr balan--  08-07-2017 per pt AM cbg's run 150-280  . Urgency of urination     Past Surgical History:  Procedure Laterality Date  . BREAST REDUCTION SURGERY Bilateral 07-18-2003   dr Shon Hough  [facility]  . COLONOSCOPY WITH PROPOFOL N/A 07/15/2017   Procedure: COLONOSCOPY WITH PROPOFOL;  Surgeon: Kathleen Fredrickson, MD;  Location: [facility] ENDOSCOPY;  Service: Endoscopy;  Laterality: N/A;  . invitro fertilization  11/16/2010   [facility]    Family History  Problem Relation Age of Onset  . Arthritis Mother   . Diabetes Mother   . Hypertension Mother   . Heart disease Mother 75       MI  . Stroke Mother   . Diabetes Brother   . Hypertension Brother   . Arthritis Maternal Grandmother   . Diabetes  Maternal Grandmother   . Hypertension Maternal Grandmother   . Cancer Maternal Grandmother   . Stroke Maternal Grandfather   . Cancer Maternal Grandfather   . Asthma Sister   . Allergies Sister     Social History:  reports that she has never smoked. She has never used smokeless tobacco. She reports that she does not drink alcohol or use drugs.  Allergies: No Known Allergies  No medications prior to admission.    Review of Systems  Constitutional: Negative.  Diaphoresis: facial hirsutism.  HENT: Negative.   Eyes: Negative.   Respiratory: Negative.   Cardiovascular: Negative.   Gastrointestinal: Negative.   Genitourinary: Negative.   Skin: Negative.   Neurological: Negative.   Psychiatric/Behavioral: Negative.     Height 5' 8.5" (1.74 m), weight 249 lb (112.9 kg), last menstrual period 07/17/2017. Physical Exam  Constitutional:  Severely obese  HENT:  Head: Atraumatic.  Eyes: EOM are normal.  Neck: Normal range of motion. Neck supple.  Cardiovascular: Normal rate and regular rhythm.  Respiratory: Effort normal and breath sounds normal.  GI: Soft. Bowel sounds are normal.  Exam limited by habitus  Genitourinary:  Genitourinary Comments: Pelvic exam: VULVA: vulvar excoriation chronic, VAGINA: normal appearing vagina with normal color and discharge, no lesions, CERVIX: normal appearing cervix without discharge or lesions, UTERUS: difficult to size due to body habitus, ADNEXA: no masses, exam limited by body habitus.   Endometrial bx:  Focal  simple hyperplasia without atypia  U/s 05/2017:  Appearance consistent with adenomyosis   Assessment/Plan: Pt with menorrhagia who spontaneously expelled Mirena Options for management reviewed and pt wants to proceed with endometrial ablation.   Reviewed the risks of anesthesial. Bleeding, infections and damage to adjacent organs.  Risk of perforation of the uterus reviewed separately Pt was noted to be hyperglycemic at presentation  and surgery was cancelled.  Kathleen Jordan 08/13/2017, 10:02 PM

## 2017-08-14 ENCOUNTER — Other Ambulatory Visit: Payer: Self-pay

## 2017-08-14 ENCOUNTER — Ambulatory Visit (HOSPITAL_BASED_OUTPATIENT_CLINIC_OR_DEPARTMENT_OTHER)
Admission: RE | Admit: 2017-08-14 | Discharge: 2017-08-14 | Disposition: A | Discharge status: Home / Self Care | Payer: Managed Care, Other (non HMO) | Source: Ambulatory Visit | Attending: Obstetrics and Gynecology | Admitting: Obstetrics and Gynecology

## 2017-08-14 ENCOUNTER — Encounter (HOSPITAL_BASED_OUTPATIENT_CLINIC_OR_DEPARTMENT_OTHER)
Admission: RE | Disposition: A | Discharge status: Home / Self Care | Payer: Self-pay | Source: Ambulatory Visit | Attending: Obstetrics and Gynecology

## 2017-08-14 ENCOUNTER — Encounter (HOSPITAL_BASED_OUTPATIENT_CLINIC_OR_DEPARTMENT_OTHER): Payer: Self-pay | Admitting: Anesthesiology

## 2017-08-14 DIAGNOSIS — E1142 Type 2 diabetes mellitus with diabetic polyneuropathy: Secondary | ICD-10-CM | POA: Insufficient documentation

## 2017-08-14 DIAGNOSIS — I10 Essential (primary) hypertension: Secondary | ICD-10-CM | POA: Insufficient documentation

## 2017-08-14 DIAGNOSIS — Z794 Long term (current) use of insulin: Secondary | ICD-10-CM | POA: Diagnosis not present

## 2017-08-14 DIAGNOSIS — Z539 Procedure and treatment not carried out, unspecified reason: Secondary | ICD-10-CM | POA: Diagnosis not present

## 2017-08-14 DIAGNOSIS — N8501 Benign endometrial hyperplasia: Secondary | ICD-10-CM | POA: Diagnosis present

## 2017-08-14 DIAGNOSIS — N920 Excessive and frequent menstruation with regular cycle: Secondary | ICD-10-CM | POA: Diagnosis present

## 2017-08-14 DIAGNOSIS — G4733 Obstructive sleep apnea (adult) (pediatric): Secondary | ICD-10-CM | POA: Insufficient documentation

## 2017-08-14 HISTORY — DX: Excessive and frequent menstruation with regular cycle: N92.0

## 2017-08-14 HISTORY — DX: Personal history of other specified conditions: Z87.898

## 2017-08-14 HISTORY — DX: Urgency of urination: R39.15

## 2017-08-14 HISTORY — DX: Anal fissure, unspecified: K60.2

## 2017-08-14 HISTORY — DX: Long term (current) use of insulin: E11.65

## 2017-08-14 HISTORY — DX: Frequency of micturition: R35.0

## 2017-08-14 HISTORY — DX: Type 2 diabetes mellitus with hyperglycemia: Z79.4

## 2017-08-14 HISTORY — DX: Nontoxic goiter, unspecified: E04.9

## 2017-08-14 HISTORY — DX: Obstructive sleep apnea (adult) (pediatric): G47.33

## 2017-08-14 HISTORY — DX: Polyneuropathy, unspecified: G62.9

## 2017-08-14 HISTORY — DX: Other constipation: K59.09

## 2017-08-14 LAB — GLUCOSE, CAPILLARY: GLUCOSE-CAPILLARY: 270 mg/dL — AB (ref 65–99)

## 2017-08-14 LAB — POCT PREGNANCY, URINE: PREG TEST UR: NEGATIVE

## 2017-08-14 LAB — POCT I-STAT 4, (NA,K, GLUC, HGB,HCT)
GLUCOSE: 301 mg/dL — AB (ref 65–99)
HCT: 42 % (ref 36.0–46.0)
Hemoglobin: 14.3 g/dL (ref 12.0–15.0)
POTASSIUM: 3.6 mmol/L (ref 3.5–5.1)
SODIUM: 141 mmol/L (ref 135–145)

## 2017-08-14 SURGERY — DILATATION & CURETTAGE/HYSTEROSCOPY WITH HYDROTHERMAL ABLATION
Anesthesia: Choice

## 2017-08-14 MED ORDER — LACTATED RINGERS IV SOLN
INTRAVENOUS | Status: DC
  Administered 2017-08-14: 10:00:00 via INTRAVENOUS
  Filled 2017-08-14 (×2): qty 1000

## 2017-08-14 MED ORDER — MIDAZOLAM HCL 2 MG/2ML IJ SOLN
INTRAMUSCULAR | Status: AC
  Filled 2017-08-14: qty 2

## 2017-08-14 MED ORDER — FENTANYL CITRATE (PF) 100 MCG/2ML IJ SOLN
INTRAMUSCULAR | Status: AC
  Filled 2017-08-14: qty 2

## 2017-08-14 SURGICAL SUPPLY — 24 items
ABLATOR ENDOMETRIAL BIPOLAR (ABLATOR) IMPLANT
BIPOLAR CUTTING LOOP 21FR (ELECTRODE)
BOOTIES KNEE HIGH SLOAN (MISCELLANEOUS) ×1 IMPLANT
CANISTER SUCT 3000ML PPV (MISCELLANEOUS) ×1 IMPLANT
CATH ROBINSON RED A/P 16FR (CATHETERS) ×1 IMPLANT
CLOTH BEACON ORANGE TIMEOUT ST (SAFETY) ×1 IMPLANT
COUNTER NEEDLE 1200 MAGNETIC (NEEDLE) ×1 IMPLANT
CURETTE PIPELLE ENDOMTRL SUCTN (MISCELLANEOUS) IMPLANT
DILATOR CANAL MILEX (MISCELLANEOUS) IMPLANT
ELECT REM PT RETURN 9FT ADLT (ELECTROSURGICAL)
ELECTRODE REM PT RTRN 9FT ADLT (ELECTROSURGICAL) ×1 IMPLANT
GLOVE SURG SS PI 6.5 STRL IVOR (GLOVE) ×1 IMPLANT
GOWN STRL REUS W/TWL LRG LVL3 (GOWN DISPOSABLE) ×2 IMPLANT
IV NS IRRIG 3000ML ARTHROMATIC (IV SOLUTION) ×2 IMPLANT
KIT TURNOVER CYSTO (KITS) ×1 IMPLANT
LOOP CUTTING BIPOLAR 21FR (ELECTRODE) IMPLANT
PACK VAGINAL MINOR WOMEN LF (CUSTOM PROCEDURE TRAY) ×1 IMPLANT
PAD OB MATERNITY 4.3X12.25 (PERSONAL CARE ITEMS) ×1 IMPLANT
PIPELLE ENDOMETRIAL SUCTION CU (MISCELLANEOUS)
SUT VIC AB 0 CT2 27 (SUTURE) IMPLANT
TOWEL OR 17X24 6PK STRL BLUE (TOWEL DISPOSABLE) ×2 IMPLANT
TUBING AQUILEX INFLOW (TUBING) ×1 IMPLANT
TUBING AQUILEX OUTFLOW (TUBING) ×1 IMPLANT
WATER STERILE IRR 500ML POUR (IV SOLUTION) ×1 IMPLANT

## 2017-08-20 ENCOUNTER — Other Ambulatory Visit: Payer: Self-pay | Admitting: Family Medicine

## 2017-08-24 ENCOUNTER — Other Ambulatory Visit: Payer: Self-pay | Admitting: Family Medicine

## 2017-08-26 NOTE — Telephone Encounter (Signed)
Received refill request for ezetimibe (ZETIA) 10 MG tablet. Last office visit 07/24/17 and last refill 03/14/17.

## 2017-09-21 ENCOUNTER — Other Ambulatory Visit: Payer: Self-pay | Admitting: Family Medicine

## 2017-09-27 ENCOUNTER — Other Ambulatory Visit: Payer: Self-pay | Admitting: Family Medicine

## 2017-09-30 ENCOUNTER — Other Ambulatory Visit: Payer: Self-pay | Admitting: *Deleted

## 2017-10-25 ENCOUNTER — Other Ambulatory Visit: Payer: Self-pay | Admitting: Family Medicine

## 2017-12-01 ENCOUNTER — Other Ambulatory Visit: Payer: Self-pay | Admitting: Family Medicine

## 2018-01-04 ENCOUNTER — Other Ambulatory Visit: Payer: Self-pay | Admitting: Family Medicine

## 2018-02-06 ENCOUNTER — Other Ambulatory Visit: Payer: Self-pay | Admitting: Obstetrics and Gynecology

## 2018-02-09 ENCOUNTER — Other Ambulatory Visit: Payer: Self-pay | Admitting: Family Medicine

## 2018-02-25 ENCOUNTER — Encounter (HOSPITAL_BASED_OUTPATIENT_CLINIC_OR_DEPARTMENT_OTHER): Payer: Self-pay | Admitting: *Deleted

## 2018-02-25 ENCOUNTER — Other Ambulatory Visit: Payer: Self-pay

## 2018-02-25 NOTE — Progress Notes (Addendum)
Spoke w/ pt via phone for pre-op interview.  Npo after mn.  Arrive at 0800.  Needs urine preg.  Pt getting cbc, bmp, and ekg done Monday 03-02-2018 @ 0900.  Pt verbalized understanding no insulin morning of surgery and do half insulin dose night before surgery.   ADDENDUM:  Received call from dr Pennie Rushinghaygood wanting to add HgA1c to pt's lab work done yesterday, if lab unable to do have one done am dos.  Verbally verified , placed order in epic.  Lab is able to do A1c.

## 2018-03-02 ENCOUNTER — Encounter (HOSPITAL_COMMUNITY)
Admission: RE | Admit: 2018-03-02 | Discharge: 2018-03-02 | Disposition: A | Discharge status: Home / Self Care | Payer: Managed Care, Other (non HMO) | Source: Ambulatory Visit | Attending: Obstetrics and Gynecology | Admitting: Obstetrics and Gynecology

## 2018-03-02 DIAGNOSIS — Z01818 Encounter for other preprocedural examination: Secondary | ICD-10-CM | POA: Diagnosis present

## 2018-03-02 LAB — CBC
HEMATOCRIT: 43.9 % (ref 36.0–46.0)
HEMOGLOBIN: 14.3 g/dL (ref 12.0–15.0)
MCH: 29.4 pg (ref 26.0–34.0)
MCHC: 32.6 g/dL (ref 30.0–36.0)
MCV: 90.1 fL (ref 80.0–100.0)
Platelets: 280 10*3/uL (ref 150–400)
RBC: 4.87 MIL/uL (ref 3.87–5.11)
RDW: 12.3 % (ref 11.5–15.5)
WBC: 7.5 10*3/uL (ref 4.0–10.5)
nRBC: 0 % (ref 0.0–0.2)

## 2018-03-02 LAB — BASIC METABOLIC PANEL
ANION GAP: 10 (ref 5–15)
BUN: 11 mg/dL (ref 6–20)
CALCIUM: 9.6 mg/dL (ref 8.9–10.3)
CHLORIDE: 106 mmol/L (ref 98–111)
CO2: 24 mmol/L (ref 22–32)
Creatinine, Ser: 0.66 mg/dL (ref 0.44–1.00)
GFR calc non Af Amer: >60 mL/min (ref >60)
GLUCOSE: 127 mg/dL — AB (ref 70–99)
Potassium: 3.5 mmol/L (ref 3.5–5.1)
Sodium: 140 mmol/L (ref 135–145)

## 2018-03-03 LAB — HEMOGLOBIN A1C
Hgb A1c MFr Bld: 6.7 % — ABNORMAL HIGH (ref 4.8–5.6)
Mean Plasma Glucose: 145.59 mg/dL

## 2018-03-05 NOTE — H&P (Signed)
Kathleen Jordan is an 47 y.o. female.  She presents for endometrial ablation for management of menorrhagia and for placement of Mirena IUD for contraception.   Pertinent Gynecological History: Menses: flow is excessive with use of 12-18  pads or tampons on heaviest days,every month without intermenstrual spotting Bleeding: heavy Contraception: none  IUD expelled spontaneously DES exposure: unknown Blood transfusions: none Sexually transmitted diseases: no past history Previous GYN Procedures: IVF  Last mammogram: normal Date: 2019 Last pap: normal Date: 2018 OB History: G3, P1   Menstrual History: Menarche age: 22 Patient's last menstrual period was 02/23/2018 (exact date).    Past Medical History:  Diagnosis Date  . Allergic rhinitis   . Anal fissure 07/15/2017  . Chronic constipation   . Frequency of urination   . GERD (gastroesophageal reflux disease)   . History of palpitations   . Hx of varicella   . Hyperlipidemia   . Hypertension   . Menorrhagia   . OSA (obstructive sleep apnea)    08-07-2017 no cpap due to finanaces  . PCOS (polycystic ovarian syndrome)   . Peripheral neuropathy   . Thyroid goiter   . Type 2 diabetes mellitus with hyperglycemia, with long-term current use of insulin [facility])    endocrinologist-  dr balan--  08-07-2017 per pt AM cbg's run 150-280  . Urgency of urination     Past Surgical History:  Procedure Laterality Date  . BREAST REDUCTION SURGERY Bilateral 07-18-2003   dr Shon Hough  [facility]  . COLONOSCOPY WITH PROPOFOL N/A 07/15/2017   Procedure: COLONOSCOPY WITH PROPOFOL;  Surgeon: Hilarie Fredrickson, MD;  Location: [facility] ENDOSCOPY;  Service: Endoscopy;  Laterality: N/A;  . invitro fertilization  11/16/2010   [facility]    Family History  Problem Relation Age of Onset  . Arthritis Mother   . Diabetes Mother   . Hypertension Mother   . Heart disease Mother 83       MI  . Stroke Mother   . Diabetes Brother   . Hypertension Brother   .  Arthritis Maternal Grandmother   . Diabetes Maternal Grandmother   . Hypertension Maternal Grandmother   . Cancer Maternal Grandmother   . Stroke Maternal Grandfather   . Cancer Maternal Grandfather   . Asthma Sister   . Allergies Sister     Social History:  reports that she has never smoked. She has never used smokeless tobacco. She reports that she does not drink alcohol or use drugs.  Allergies: No Known Allergies  Medications Prior to Admission  Medication Sig Dispense Refill Last Dose  . cetirizine (ZYRTEC) 10 MG tablet TAKE 1 TABLET(10 MG) BY MOUTH DAILY (Patient taking differently: Take 10 mg by mouth at bedtime. ) 90 tablet 1 03/05/2018 at Unknown time  . empagliflozin (JARDIANCE) 10 MG TABS tablet Take 10 mg by mouth every evening.   03/05/2018 at Unknown time  . ezetimibe (ZETIA) 10 MG tablet Take 1 tablet (10 mg total) by mouth daily. Needs ov (Patient taking differently: Take 10 mg by mouth every evening. Needs ov) 30 tablet 0 03/05/2018 at Unknown time  . Insulin Glargine (BASAGLAR KWIKPEN) 100 UNIT/ML SOPN Inject 65 Units into the skin 2 (two) times daily.    03/05/2018 at Unknown time  . liraglutide (VICTOZA) 18 MG/3ML SOPN Inject 1.2 mg into the skin every morning.   03/05/2018 at Unknown time  . NIFEdipine (PROCARDIA XL/ADALAT-CC) 60 MG 24 hr tablet TAKE 1 TABLET BY MOUTH DAILY (Patient taking differently: Take 60  mg by mouth every evening. ) 90 tablet 0 03/05/2018 at Unknown time  . fluticasone (FLONASE) 50 MCG/ACT nasal spray Place 2 sprays into both nostrils 2 (two) times daily as needed.    More than a month at Unknown time  . ranitidine (ZANTAC) 150 MG tablet Take 1 tablet (150 mg total) by mouth 2 (two) times daily. (Patient taking differently: Take 150 mg by mouth 2 (two) times daily as needed. ) 60 tablet 2 More than a month at Unknown time    Review of Systems  Constitutional: Negative.   HENT: Negative.   Eyes: Negative.   Respiratory: Negative.   Cardiovascular:  Negative.   Gastrointestinal: Positive for heartburn (controlled).  Genitourinary: Negative.   Skin: Positive for itching (resolved vulvar itching withh improveed blood sugar control).  Neurological: Negative.   Psychiatric/Behavioral: Negative.     Blood pressure 131/77, pulse 75, temperature (!) 97.5 F (36.4 C), temperature source Oral, resp. rate 16, height 5' 8.5" (1.74 m), weight 110.6 kg, last menstrual period 02/23/2018, SpO2 96 %. Physical Exam  Constitutional: She is oriented to person, place, and time.  Morbid obesity  HENT:  Head: Normocephalic and atraumatic.  Eyes: EOM are normal.  Neck: Normal range of motion. Neck supple.  Cardiovascular: Normal rate and regular rhythm.  Respiratory: Effort normal and breath sounds normal.  GI: Soft. Bowel sounds are normal.  Genitourinary:  Genitourinary Comments: Pelvic exam: normal external genitalia, vulva, vagina, cervix, uterus and adnexa, exam limited by habitus.  Musculoskeletal: Normal range of motion.  Neurological: She is alert and oriented to person, place, and time.  Skin: Skin is warm and dry.  Psychiatric: She has a normal mood and affect.    Recent Results (from the past 2160 hour(s))  Basic metabolic panel     Status: Abnormal   Collection Time: 03/02/18  9:56 AM  Result Value Ref Range   Sodium 140 135 - 145 mmol/L   Potassium 3.5 3.5 - 5.1 mmol/L   Chloride 106 98 - 111 mmol/L   CO2 24 22 - 32 mmol/L   Glucose, Bld 127 (H) 70 - 99 mg/dL   BUN 11 6 - 20 mg/dL   Creatinine, Ser 0.450.66 0.44 - 1.00 mg/dL   Calcium 9.6 8.9 - 40.910.3 mg/dL   GFR calc non Af Amer >60 >60 mL/min   GFR calc Af Amer >60 >60 mL/min   Anion gap 10 5 - 15    Comment: Performed at [facility], [address] [place], [place] [address]  CBC     Status: None   Collection Time: 03/02/18  9:56 AM  Result Value Ref Range   WBC 7.5 4.0 - 10.5 K/uL   RBC 4.87 3.87 - 5.11 MIL/uL   Hemoglobin 14.3 12.0 - 15.0 g/dL   HCT  47.843.9 29.536.0 - 62.146.0 %   MCV 90.1 80.0 - 100.0 fL   MCH 29.4 26.0 - 34.0 pg   MCHC 32.6 30.0 - 36.0 g/dL   RDW 30.812.3 65.711.5 - 84.615.5 %   Platelets 280 150 - 400 K/uL   nRBC 0.0 0.0 - 0.2 %    Comment: Performed at [facility], [address] [place], [place] [address]  Hemoglobin A1c     Status: Abnormal   Collection Time: 03/02/18  9:56 AM  Result Value Ref Range   Hgb A1c MFr Bld 6.7 (H) 4.8 - 5.6 %    Comment: (NOTE) Pre diabetes:  5.7%-6.4% Diabetes:              >6.4% Glycemic control for   <7.0% adults with diabetes    Mean Plasma Glucose 145.59 mg/dL    Comment: Performed at [facility], 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  Discussions have been held with the patient concerning the options for management.  She wishes to proceed with endometrial ablation, then placement of an IUD for contraception.  We have discussed the risks of anesthesia bleeding infection damage to adjacent organs and the specific risk of uterine perforation with hysteroscopy.  She understands that if uterine perforation occurs the ablation cannot be completed and that the procedure will be terminated at that point.  She understands there is a small possibility of the need for further surgery if perforation occurs.   [name] Avynn Klassen 03/06/2018, 9:39 AM

## 2018-03-06 ENCOUNTER — Encounter (HOSPITAL_BASED_OUTPATIENT_CLINIC_OR_DEPARTMENT_OTHER): Payer: Self-pay | Admitting: Obstetrics and Gynecology

## 2018-03-06 ENCOUNTER — Ambulatory Visit (HOSPITAL_BASED_OUTPATIENT_CLINIC_OR_DEPARTMENT_OTHER): Payer: Managed Care, Other (non HMO) | Admitting: Anesthesiology

## 2018-03-06 ENCOUNTER — Encounter (HOSPITAL_BASED_OUTPATIENT_CLINIC_OR_DEPARTMENT_OTHER)
Admission: RE | Disposition: A | Discharge status: Home / Self Care | Payer: Self-pay | Source: Ambulatory Visit | Attending: Obstetrics and Gynecology

## 2018-03-06 ENCOUNTER — Other Ambulatory Visit: Payer: Self-pay

## 2018-03-06 ENCOUNTER — Telehealth: Payer: Self-pay | Admitting: Obstetrics and Gynecology

## 2018-03-06 ENCOUNTER — Ambulatory Visit (HOSPITAL_BASED_OUTPATIENT_CLINIC_OR_DEPARTMENT_OTHER)
Admission: RE | Admit: 2018-03-06 | Discharge: 2018-03-06 | Disposition: A | Discharge status: Home / Self Care | Payer: Managed Care, Other (non HMO) | Source: Ambulatory Visit | Attending: Obstetrics and Gynecology | Admitting: Obstetrics and Gynecology

## 2018-03-06 DIAGNOSIS — Z79899 Other long term (current) drug therapy: Secondary | ICD-10-CM | POA: Insufficient documentation

## 2018-03-06 DIAGNOSIS — E785 Hyperlipidemia, unspecified: Secondary | ICD-10-CM | POA: Diagnosis not present

## 2018-03-06 DIAGNOSIS — I10 Essential (primary) hypertension: Secondary | ICD-10-CM | POA: Diagnosis not present

## 2018-03-06 DIAGNOSIS — E1165 Type 2 diabetes mellitus with hyperglycemia: Secondary | ICD-10-CM | POA: Diagnosis not present

## 2018-03-06 DIAGNOSIS — N920 Excessive and frequent menstruation with regular cycle: Secondary | ICD-10-CM | POA: Insufficient documentation

## 2018-03-06 DIAGNOSIS — K219 Gastro-esophageal reflux disease without esophagitis: Secondary | ICD-10-CM | POA: Insufficient documentation

## 2018-03-06 DIAGNOSIS — G4733 Obstructive sleep apnea (adult) (pediatric): Secondary | ICD-10-CM | POA: Diagnosis not present

## 2018-03-06 DIAGNOSIS — Z794 Long term (current) use of insulin: Secondary | ICD-10-CM | POA: Insufficient documentation

## 2018-03-06 HISTORY — PX: DILITATION & CURRETTAGE/HYSTROSCOPY WITH HYDROTHERMAL ABLATION: SHX5570

## 2018-03-06 LAB — GLUCOSE, CAPILLARY: GLUCOSE-CAPILLARY: 112 mg/dL — AB (ref 70–99)

## 2018-03-06 LAB — POCT PREGNANCY, URINE: Preg Test, Ur: NEGATIVE

## 2018-03-06 SURGERY — DILATATION & CURETTAGE/HYSTEROSCOPY WITH HYDROTHERMAL ABLATION
Anesthesia: General | Site: Vagina

## 2018-03-06 MED ORDER — ONDANSETRON HCL 4 MG/2ML IJ SOLN
INTRAMUSCULAR | Status: AC
  Filled 2018-03-06: qty 2

## 2018-03-06 MED ORDER — FENTANYL CITRATE (PF) 100 MCG/2ML IJ SOLN
INTRAMUSCULAR | Status: AC
  Filled 2018-03-06: qty 2

## 2018-03-06 MED ORDER — MIDAZOLAM HCL 5 MG/5ML IJ SOLN
INTRAMUSCULAR | Status: DC | PRN
  Administered 2018-03-06: 2 mg via INTRAVENOUS

## 2018-03-06 MED ORDER — FENTANYL CITRATE (PF) 100 MCG/2ML IJ SOLN
25.0000 ug | INTRAMUSCULAR | Status: DC | PRN
  Administered 2018-03-06: 25 ug via INTRAVENOUS
  Filled 2018-03-06: qty 1

## 2018-03-06 MED ORDER — LACTATED RINGERS IV SOLN
INTRAVENOUS | Status: DC
  Administered 2018-03-06: 1000 mL via INTRAVENOUS
  Filled 2018-03-06: qty 1000

## 2018-03-06 MED ORDER — LIDOCAINE HCL 2 % IJ SOLN
INTRAMUSCULAR | Status: DC | PRN
  Administered 2018-03-06: 10 mL

## 2018-03-06 MED ORDER — MIDAZOLAM HCL 2 MG/2ML IJ SOLN
INTRAMUSCULAR | Status: AC
  Filled 2018-03-06: qty 2

## 2018-03-06 MED ORDER — SILVER NITRATE-POT NITRATE 75-25 % EX MISC
CUTANEOUS | Status: DC | PRN
  Administered 2018-03-06: 1

## 2018-03-06 MED ORDER — OXYCODONE HCL 5 MG PO TABS
ORAL_TABLET | ORAL | Status: AC
  Filled 2018-03-06: qty 1

## 2018-03-06 MED ORDER — IBUPROFEN 600 MG PO TABS: ORAL_TABLET | ORAL | 0 refills | Status: DC

## 2018-03-06 MED ORDER — KETOROLAC TROMETHAMINE 30 MG/ML IJ SOLN
INTRAMUSCULAR | Status: DC | PRN
  Administered 2018-03-06: 30 mg via INTRAMUSCULAR
  Administered 2018-03-06: 30 mg via INTRAVENOUS

## 2018-03-06 MED ORDER — FENTANYL CITRATE (PF) 100 MCG/2ML IJ SOLN
INTRAMUSCULAR | Status: DC | PRN
  Administered 2018-03-06: 50 ug via INTRAVENOUS
  Administered 2018-03-06: 25 ug via INTRAVENOUS

## 2018-03-06 MED ORDER — DEXAMETHASONE SODIUM PHOSPHATE 10 MG/ML IJ SOLN
INTRAMUSCULAR | Status: AC
  Filled 2018-03-06: qty 1

## 2018-03-06 MED ORDER — PROPOFOL 10 MG/ML IV BOLUS
INTRAVENOUS | Status: DC | PRN
  Administered 2018-03-06: 200 mg via INTRAVENOUS

## 2018-03-06 MED ORDER — KETOROLAC TROMETHAMINE 30 MG/ML IJ SOLN
INTRAMUSCULAR | Status: AC
  Filled 2018-03-06: qty 2

## 2018-03-06 MED ORDER — DOXYCYCLINE HYCLATE 100 MG PO CAPS: 100.0000 mg | ORAL_CAPSULE | Freq: Two times a day (BID) | ORAL | 0 refills | Status: AC

## 2018-03-06 MED ORDER — DEXAMETHASONE SODIUM PHOSPHATE 10 MG/ML IJ SOLN
INTRAMUSCULAR | Status: DC | PRN
  Administered 2018-03-06: 4 mg via INTRAVENOUS

## 2018-03-06 MED ORDER — PROPOFOL 10 MG/ML IV BOLUS
INTRAVENOUS | Status: AC
  Filled 2018-03-06: qty 20

## 2018-03-06 MED ORDER — ONDANSETRON HCL 4 MG/2ML IJ SOLN
INTRAMUSCULAR | Status: DC | PRN
  Administered 2018-03-06: 4 mg via INTRAVENOUS

## 2018-03-06 MED ORDER — OXYCODONE HCL 5 MG PO TABS
5.0000 mg | ORAL_TABLET | ORAL | Status: DC | PRN
  Administered 2018-03-06: 5 mg via ORAL
  Filled 2018-03-06: qty 1

## 2018-03-06 MED ORDER — SODIUM CHLORIDE 0.9 % IR SOLN
Status: DC | PRN
  Administered 2018-03-06: 3000 mL

## 2018-03-06 SURGICAL SUPPLY — 37 items
ABLATOR SURESOUND NOVASURE (ABLATOR) IMPLANT
BIPOLAR CUTTING LOOP 21FR (ELECTRODE)
BOOTIES KNEE HIGH SLOAN (MISCELLANEOUS) ×3 IMPLANT
CANISTER SUCT 3000ML PPV (MISCELLANEOUS) ×3 IMPLANT
CATH ROBINSON RED A/P 16FR (CATHETERS) ×3 IMPLANT
CLOTH BEACON ORANGE TIMEOUT ST (SAFETY) ×3 IMPLANT
COUNTER NEEDLE 1200 MAGNETIC (NEEDLE) ×3 IMPLANT
COVER WAND RF STERILE (DRAPES) ×3 IMPLANT
CURETTE PIPELLE ENDOMTRL SUCTN (MISCELLANEOUS) IMPLANT
DILATOR CANAL MILEX (MISCELLANEOUS) IMPLANT
ELECT REM PT RETURN 9FT ADLT (ELECTROSURGICAL) ×3
ELECTRODE REM PT RTRN 9FT ADLT (ELECTROSURGICAL) ×1 IMPLANT
GAUZE 4X4 16PLY RFD (DISPOSABLE) ×3 IMPLANT
GLOVE BIO SURGEON STRL SZ 6.5 (GLOVE) ×1 IMPLANT
GLOVE BIO SURGEON STRL SZ7 (GLOVE) ×2 IMPLANT
GLOVE BIO SURGEONS STRL SZ 6.5 (GLOVE) ×1
GLOVE BIOGEL PI IND STRL 6.5 (GLOVE) IMPLANT
GLOVE BIOGEL PI IND STRL 7.0 (GLOVE) IMPLANT
GLOVE BIOGEL PI IND STRL 7.5 (GLOVE) IMPLANT
GLOVE BIOGEL PI INDICATOR 6.5 (GLOVE) ×2
GLOVE BIOGEL PI INDICATOR 7.0 (GLOVE) ×2
GLOVE BIOGEL PI INDICATOR 7.5 (GLOVE) ×2
GLOVE SURG SS PI 6.5 STRL IVOR (GLOVE) ×3 IMPLANT
GOWN STRL REUS W/TWL LRG LVL3 (GOWN DISPOSABLE) ×6 IMPLANT
IV NS IRRIG 3000ML ARTHROMATIC (IV SOLUTION) ×6 IMPLANT
KIT TURNOVER CYSTO (KITS) ×3 IMPLANT
LOOP CUTTING BIPOLAR 21FR (ELECTRODE) IMPLANT
PACK VAGINAL MINOR WOMEN LF (CUSTOM PROCEDURE TRAY) ×3 IMPLANT
PAD OB MATERNITY 4.3X12.25 (PERSONAL CARE ITEMS) ×3 IMPLANT
PIPELLE ENDOMETRIAL SUCTION CU (MISCELLANEOUS)
SET GENESYS HTA PROCERVA (MISCELLANEOUS) ×2 IMPLANT
SUT VIC AB 0 CT2 27 (SUTURE) IMPLANT
TOWEL OR 17X24 6PK STRL BLUE (TOWEL DISPOSABLE) ×6 IMPLANT
TUBING AQUILEX INFLOW (TUBING) ×1 IMPLANT
TUBING AQUILEX OUTFLOW (TUBING) ×3 IMPLANT
WATER STERILE IRR 500ML POUR (IV SOLUTION) ×3 IMPLANT
mirena intrauterine ×2 IMPLANT

## 2018-03-06 NOTE — Telephone Encounter (Signed)
I received a call from the answering service. I called the patient back. She C/O crampy pain that was not relieved by 600 mg of ibuprofen at 4:30 p.m. She had a HTA ablation of the endometrium today. She reports that she was told by her endocrinologist not to take Tylenol. She denies fever, chills, N and/or V. I offered evaluation in the ER vs outpatient management with different oral pain meds. She chooses outpatient p.o. pain meds. She was told to call if she has additional problems. AVS

## 2018-03-06 NOTE — Transfer of Care (Signed)
Immediate Anesthesia Transfer of Care Note  Patient: Kathleen Jordan Certain  Procedure(s) Performed: DILATATION & CURETTAGE/HYSTEROSCOPY WITH HYDROTHERMAL ABLATION, INSERTION OF INTRAUTERINE DEVICE (N/A Vagina )  Patient Location: PACU  Anesthesia Type:General  Level of Consciousness: awake, alert  and oriented  Airway & Oxygen Therapy: Patient Spontanous Breathing and Patient connected to face mask oxygen  Post-op Assessment: Report given to RN and Post -op Vital signs reviewed and stable  Post vital signs: Reviewed  Last Vitals:  Vitals Value Taken Time  BP    Temp    Pulse 73 03/06/2018 11:03 AM  Resp 5 03/06/2018 11:03 AM  SpO2 93 % 03/06/2018 11:03 AM  Vitals shown include unvalidated device data.  Last Pain:  Vitals:   03/06/18 0810  TempSrc: Oral      Patients Stated Pain Goal: 8 (03/06/18 [id number])  Complications: No apparent anesthesia complications

## 2018-03-06 NOTE — Anesthesia Preprocedure Evaluation (Addendum)
Anesthesia Evaluation  Patient identified by MRN, date of birth, ID band Patient awake    Reviewed: Allergy & Precautions, NPO status , Patient's Chart, lab work & pertinent test results  Airway Mallampati: I  TM Distance: >3 FB Neck ROM: Full    Dental no notable dental hx. (+) Teeth Intact, Dental Advisory Given   Pulmonary sleep apnea (does not use CPAP) ,    Pulmonary exam normal breath sounds clear to auscultation       Cardiovascular hypertension, Pt. on medications negative cardio ROS Normal cardiovascular exam Rhythm:Regular Rate:Normal     Neuro/Psych negative neurological ROS  negative psych ROS   GI/Hepatic Neg liver ROS, GERD  Controlled,  Endo/Other  negative endocrine ROSdiabetes, Type 2, Insulin DependentPCOS  Renal/GU negative Renal ROS  negative genitourinary   Musculoskeletal negative musculoskeletal ROS (+)   Abdominal   Peds  Hematology negative hematology ROS (+)   Anesthesia Other Findings menorrhagia  Reproductive/Obstetrics                            Anesthesia Physical Anesthesia Plan  ASA: III  Anesthesia Plan: General   Post-op Pain Management:    Induction: Intravenous  PONV Risk Score and Plan: 3 and Dexamethasone, Ondansetron and Midazolam  Airway Management Planned: LMA  Additional Equipment:   Intra-op Plan:   Post-operative Plan: Extubation in OR  Informed Consent: I have reviewed the patients History and Physical, chart, labs and discussed the procedure including the risks, benefits and alternatives for the proposed anesthesia with the patient or authorized representative who has indicated his/her understanding and acceptance.   Dental advisory given  Plan Discussed with: CRNA  Anesthesia Plan Comments:         Anesthesia Quick Evaluation

## 2018-03-06 NOTE — Discharge Instructions (Signed)
Endometrial Ablation °Endometrial ablation is a procedure that destroys the thin inner layer of the lining of the uterus (endometrium). This procedure may be done: °· To stop heavy periods. °· To stop bleeding that is causing anemia. °· To control irregular bleeding. °· To treat bleeding caused by small tumors (fibroids) in the endometrium. ° °This procedure is often an alternative to major surgery, such as removal of the uterus and cervix (hysterectomy). As a result of this procedure: °· You may not be able to have children. However, if you are premenopausal (you have not gone through menopause): °? You may still have a small chance of getting pregnant. °? You will need to use a reliable method of birth control after the procedure to prevent pregnancy. °· You may stop having a menstrual period, or you may have only a small amount of bleeding during your period. Menstruation may return several years after the procedure. ° °Tell a health care provider about: °· Any allergies you have. °· All medicines you are taking, including vitamins, herbs, eye drops, creams, and over-the-counter medicines. °· Any problems you or family members have had with the use of anesthetic medicines. °· Any blood disorders you have. °· Any surgeries you have had. °· Any medical conditions you have. °What are the risks? °Generally, this is a safe procedure. However, problems may occur, including: °· A hole (perforation) in the uterus or bowel. °· Infection of the uterus, bladder, or vagina. °· Bleeding. °· Damage to other structures or organs. °· An air bubble in the lung (air embolus). °· Problems with pregnancy after the procedure. °· Failure of the procedure. °· Decreased ability to diagnose cancer in the endometrium. ° °What happens before the procedure? °· You will have tests of your endometrium to make sure there are no pre-cancerous cells or cancer cells present. °· You may have an ultrasound of the uterus. °· You may be given  medicines to thin the endometrium. °· Ask your health care provider about: °? Changing or stopping your regular medicines. This is especially important if you take diabetes medicines or blood thinners. °? Taking medicines such as aspirin and ibuprofen. These medicines can thin your blood. Do not take these medicines before your procedure if your doctor tells you not to. °· Plan to have someone take you home from the hospital or clinic. °What happens during the procedure? °· You will lie on an exam table with your feet and legs supported as in a pelvic exam. °· To lower your risk of infection: °? Your health care team will wash or sanitize their hands and put on germ-free (sterile) gloves. °? Your genital area will be washed with soap. °· An IV tube will be inserted into one of your veins. °· You will be given a medicine to help you relax (sedative). °· A surgical instrument with a light and camera (resectoscope) will be inserted into your vagina and moved into your uterus. This allows your surgeon to see inside your uterus. °· Endometrial tissue will be removed using one of the following methods: °? Radiofrequency. This method uses a radiofrequency-alternating electric current to remove the endometrium. °? Cryotherapy. This method uses extreme cold to freeze the endometrium. °? Heated-free liquid. This method uses a heated saltwater (saline) solution to remove the endometrium. °? Microwave. This method uses high-energy microwaves to heat up the endometrium and remove it. °? Thermal balloon. This method involves inserting a catheter with a balloon tip into the uterus. The balloon tip is   filled with heated fluid to remove the endometrium. °The procedure may vary among health care providers and hospitals. °What happens after the procedure? °· Your blood pressure, heart rate, breathing rate, and blood oxygen level will be monitored until the medicines you were given have worn off. °· As tissue healing occurs, you may  notice vaginal bleeding for 4-6 weeks after the procedure. You may also experience: °? Cramps. °? Thin, watery vaginal discharge that is light pink or brown in color. °? A need to urinate more frequently than usual. °? Nausea. °· Do not drive for 24 hours if you were given a sedative. °· Do not have sex or insert anything into your vagina until your health care provider approves. °Summary °· Endometrial ablation is done to treat the many causes of heavy menstrual bleeding. °· The procedure may be done only after medications have been tried to control the bleeding. °· Plan to have someone take you home from the hospital or clinic. °This information is not intended to replace advice given to you by your health care provider. Make sure you discuss any questions you have with your health care provider. °Document Released: 01/26/2004 Document Revised: 04/04/2016 Document Reviewed: 04/04/2016 °Elsevier Interactive Patient Education © 2017 [employer] °Hysteroscopy, Care After °Refer to this sheet in the next few weeks. These instructions provide you with information on caring for yourself after your procedure. Your health care provider may also give you more specific instructions. Your treatment has been planned according to current medical practices, but problems sometimes occur. Call your health care provider if you have any problems or questions after your procedure. °What can I expect after the procedure? °After your procedure, it is typical to have the following: °· You may have some cramping. This normally lasts for a couple days. °· You may have bleeding. This can vary from light spotting for a few days to menstrual-like bleeding for 3-7 days. ° °Follow these instructions at home: °· Rest for the first 1-2 days after the procedure. °· Only take over-the-counter or prescription medicines as directed by your health care provider. Do not take aspirin. It can increase the chances of bleeding. °· Take showers instead  of baths for 2 weeks or as directed by your health care provider. °· Do not drive for 24 hours or as directed. °· Do not drink alcohol while taking pain medicine. °· Do not use tampons, douche, or have sexual intercourse for 2 weeks or until your health care provider says it is okay. °· Take your temperature twice a day for 4-5 days. Write it down each time. °· Follow your health care provider's advice about diet, exercise, and lifting. °· If you develop constipation, you may: °? Take a mild laxative if your health care provider approves. °? Add bran foods to your diet. °? Drink enough fluids to keep your urine clear or pale yellow. °· Try to have someone with you or available to you for the first 24-48 hours, especially if you were given a general anesthetic. °· Follow up with your health care provider as directed. °Contact a health care provider if: °· You feel dizzy or lightheaded. °· You feel sick to your stomach (nauseous). °· You have abnormal vaginal discharge. °· You have a rash. °· You have pain that is not controlled with medicine. °Get help right away if: °· You have bleeding that is heavier than a normal menstrual period. °· You have a fever. °· You have increasing cramps or pain, not   controlled with medicine. °· You have new belly (abdominal) pain. °· You pass out. °· You have pain in the tops of your shoulders (shoulder strap areas). °· You have shortness of breath. °This information is not intended to replace advice given to you by your health care provider. Make sure you discuss any questions you have with your health care provider. °Document Released: 01/06/2013 Document Revised: 08/24/2015 Document Reviewed: 10/15/2012 °Elsevier Interactive Patient Education © 2017 [employer] ° ° °Post Anesthesia Home Care Instructions ° °Activity: °Get plenty of rest for the remainder of the day. A responsible individual must stay with you for 24 hours following the procedure.  °For the next 24 hours, DO  NOT: °-Drive a car °-Operate machinery °-Drink alcoholic beverages °-Take any medication unless instructed by your physician °-Make any legal decisions or sign important papers. ° °Meals: °Start with liquid foods such as gelatin or soup. Progress to regular foods as tolerated. Avoid greasy, spicy, heavy foods. If nausea and/or vomiting occur, drink only clear liquids until the nausea and/or vomiting subsides. Call your physician if vomiting continues. ° °Special Instructions/Symptoms: °Your throat may feel dry or sore from the anesthesia or the breathing tube placed in your throat during surgery. If this causes discomfort, gargle with warm salt water. The discomfort should disappear within 24 hours. ° °If you had a scopolamine patch placed behind your ear for the management of post- operative nausea and/or vomiting: ° °1. The medication in the patch is effective for 72 hours, after which it should be removed.  Wrap patch in a tissue and discard in the trash. Wash hands thoroughly with soap and water. °2. You may remove the patch earlier than 72 hours if you experience unpleasant side effects which may include dry mouth, dizziness or visual disturbances. °3. Avoid touching the patch. Wash your hands with soap and water after contact with the patch. °  ° ° °

## 2018-03-06 NOTE — Anesthesia Procedure Notes (Signed)
Procedure Name: LMA Insertion Date/Time: 03/06/2018 10:06 AM Performed by: Garth BignessFlynn-Cook, Laverne Hursey A, CRNA Pre-anesthesia Checklist: Patient identified, Emergency Drugs available, Suction available, Patient being monitored and Timeout performed Patient Re-evaluated:Patient Re-evaluated prior to induction Oxygen Delivery Method: Circle system utilized Preoxygenation: Pre-oxygenation with 100% oxygen Induction Type: IV induction Ventilation: Mask ventilation without difficulty LMA: LMA inserted LMA Size: 4.0 Number of attempts: 1 Placement Confirmation: positive ETCO2 Tube secured with: Tape Dental Injury: Teeth and Oropharynx as per pre-operative assessment

## 2018-03-06 NOTE — Op Note (Signed)
03/06/2018  10:54 AM  PATIENT:  Kathleen Jordan  47 y.o. female  PRE-OPERATIVE DIAGNOSIS:  Menorrhaiga.  Desires contraception  POST-OPERATIVE DIAGNOSIS:  Menorrhaiga Desires contraception  PROCEDURE:  Procedure(s): DILATATION & CURETTAGE/HYSTEROSCOPY WITH HYDROTHERMAL ABLATION, INSERTION OF INTRAUTERINE DEVICE  SURGEON:  Hal MoralesVanessa P Haygood, MD  ASSISTANTS: none  ANESTHESIA:   general  ESTIMATED BLOOD LOSS: 10 mL   BLOOD ADMINISTERED:none  COMPLICATIONS:none  FINDINGS: The uterus sounded to 8 cm.  There were no specific endometrial lesions.  There was shaggy endometrium.     FLUID DEFICIT:n/a  Self  contained  LOCAL MEDICATIONS USED:  LIDOCAINE  and Amount: 10 ml  SPECIMEN:  No Specimen  DISPOSITION OF SPECIMEN:  N/A  COUNTS:  YES  DESCRIPTION OF PROCEDURE:the patient was taken to the operating room after appropriate identification and placed on the operating table. After the attainment of adequate general anesthesia she was placed in the lithotomy position.  Timeout was performed.  The perineum and vagina were prepped with multiple layers of Betadine. The bladder was emptied with a an in and out catheter. The perineum was draped in sterile field. A gray speculum was placed in the vagina. The cervix was grasped with a single-tooth tenaculum. A paracervical block was achieved with a total of 10 cc of 2% Xylocaine and the 5 and 7:00 positions. The uterus was sounded.  Ketorolac 30 mg IV and 30 mg IM were given.  The cervix was then dilated to accommodate the HTA hysteroscope. The hysteroscope was used to evaluate all quadrants of the uterus.  Both tubal ostia were identified.  Hysteroscope was then placed the appropriate markings past the internal os.  The integrity assessment was then completed, and the heating phase of the HTA procedure was then begun.  Once the heating phase was completed, the therapeutic phase of the procedure was begun and completed after 10 minutes.   The cooldown phase was begun and completed.  The hysteroscope was removed from the endometrial cavity. The Mirena IUD was then placed per manufacturer's recommendations.  Hysteroscopy was then performed to ensure that the IUD was indeed inside the endometrial cavity. All instruments were then removed from the vagina and the patient was awakened from general anesthesia and taken to the recovery room in satisfactory condition having tolerated the procedure well sponge and instrument counts correct.  PLAN OF CARE: Discharge home after postanesthesia care.  PATIENT DISPOSITION:  PACU - hemodynamically stable.   Delay start of Pharmacological VTE agent (>24hrs) due to surgical blood loss or risk of bleeding:  Yes.  SCD hose were used throughout the entire procedure.   Hal MoralesVanessa P Haygood, MD 10:54 AM

## 2018-03-06 NOTE — Anesthesia Postprocedure Evaluation (Signed)
Anesthesia Post Note  Patient: Kathleen Jordan  Procedure(s) Performed: DILATATION & CURETTAGE/HYSTEROSCOPY WITH HYDROTHERMAL ABLATION, INSERTION OF INTRAUTERINE DEVICE (N/A Vagina )     Patient location during evaluation: PACU Anesthesia Type: General Level of consciousness: awake and alert Pain management: pain level controlled Vital Signs Assessment: post-procedure vital signs reviewed and stable Respiratory status: spontaneous breathing, nonlabored ventilation, respiratory function stable and patient connected to nasal cannula oxygen Cardiovascular status: blood pressure returned to baseline and stable Postop Assessment: no apparent nausea or vomiting Anesthetic complications: no    Last Vitals:  Vitals:   03/06/18 1130 03/06/18 1145  BP: 108/63 112/75  Pulse: 65 64  Resp: 17 12  Temp:    SpO2: 92% 96%    Last Pain:  Vitals:   03/06/18 1315  TempSrc:   PainSc: 5                  Gracelyn Coventry L Neely Kammerer

## 2018-03-09 ENCOUNTER — Encounter (HOSPITAL_BASED_OUTPATIENT_CLINIC_OR_DEPARTMENT_OTHER): Payer: Self-pay | Admitting: Obstetrics and Gynecology

## 2018-03-15 ENCOUNTER — Other Ambulatory Visit: Payer: Self-pay | Admitting: Family Medicine

## 2018-04-12 ENCOUNTER — Other Ambulatory Visit: Payer: Self-pay | Admitting: Family Medicine

## 2018-04-15 ENCOUNTER — Other Ambulatory Visit: Payer: Self-pay | Admitting: Family Medicine

## 2018-05-13 ENCOUNTER — Encounter: Payer: Self-pay | Admitting: Family Medicine

## 2018-05-13 ENCOUNTER — Ambulatory Visit: Payer: Managed Care, Other (non HMO) | Admitting: Family Medicine

## 2018-05-13 VITALS — BP 116/78 | HR 84 | Temp 98.9°F | Resp 16 | Ht 69.0 in | Wt 247.8 lb

## 2018-05-13 DIAGNOSIS — R1011 Right upper quadrant pain: Secondary | ICD-10-CM

## 2018-05-13 DIAGNOSIS — R1013 Epigastric pain: Secondary | ICD-10-CM | POA: Diagnosis not present

## 2018-05-13 LAB — POC URINALSYSI DIPSTICK (AUTOMATED)
BILIRUBIN UA: NEGATIVE
Blood, UA: NEGATIVE
GLUCOSE UA: POSITIVE — AB
Ketones, UA: NEGATIVE
Leukocytes, UA: NEGATIVE
Nitrite, UA: NEGATIVE
Protein, UA: NEGATIVE
Spec Grav, UA: 1.015 (ref 1.010–1.025)
Urobilinogen, UA: 0.2 E.U./dL
pH, UA: 6 (ref 5.0–8.0)

## 2018-05-13 LAB — CBC WITH DIFFERENTIAL/PLATELET
BASOS ABS: 0.1 10*3/uL (ref 0.0–0.1)
Basophils Relative: 0.6 % (ref 0.0–3.0)
Eosinophils Absolute: 0.2 10*3/uL (ref 0.0–0.7)
Eosinophils Relative: 2.6 % (ref 0.0–5.0)
HCT: 44.4 % (ref 36.0–46.0)
Hemoglobin: 15 g/dL (ref 12.0–15.0)
LYMPHS PCT: 37.1 % (ref 12.0–46.0)
Lymphs Abs: 3 10*3/uL (ref 0.7–4.0)
MCHC: 33.9 g/dL (ref 30.0–36.0)
MCV: 87.9 fl (ref 78.0–100.0)
Monocytes Absolute: 0.4 10*3/uL (ref 0.1–1.0)
Monocytes Relative: 5.4 % (ref 3.0–12.0)
Neutro Abs: 4.3 10*3/uL (ref 1.4–7.7)
Neutrophils Relative %: 54.3 % (ref 43.0–77.0)
Platelets: 287 10*3/uL (ref 150.0–400.0)
RBC: 5.05 Mil/uL (ref 3.87–5.11)
RDW: 13.2 % (ref 11.5–15.5)
WBC: 8 10*3/uL (ref 4.0–10.5)

## 2018-05-13 LAB — COMPREHENSIVE METABOLIC PANEL
ALT: 30 U/L (ref 0–35)
AST: 25 U/L (ref 0–37)
Albumin: 4.4 g/dL (ref 3.5–5.2)
Alkaline Phosphatase: 73 U/L (ref 39–117)
BUN: 12 mg/dL (ref 6–23)
CO2: 28 mEq/L (ref 19–32)
Calcium: 9.3 mg/dL (ref 8.4–10.5)
Chloride: 101 mEq/L (ref 96–112)
Creatinine, Ser: 0.74 mg/dL (ref 0.40–1.20)
GFR: 102.51 mL/min (ref >60.00)
Glucose, Bld: 183 mg/dL — ABNORMAL HIGH (ref 70–99)
Potassium: 3.7 mEq/L (ref 3.5–5.1)
Sodium: 137 mEq/L (ref 135–145)
Total Bilirubin: 0.3 mg/dL (ref 0.2–1.2)
Total Protein: 7.5 g/dL (ref 6.0–8.3)

## 2018-05-13 LAB — LIPASE: Lipase: 45 U/L (ref 11.0–59.0)

## 2018-05-13 LAB — AMYLASE: Amylase: 42 U/L (ref 27–131)

## 2018-05-13 LAB — H. PYLORI ANTIBODY, IGG: H Pylori IgG: NEGATIVE

## 2018-05-13 MED ORDER — FAMOTIDINE 20 MG PO TABS: 20.0000 mg | ORAL_TABLET | Freq: Two times a day (BID) | ORAL | 5 refills | Status: DC

## 2018-05-13 MED ORDER — HYOSCYAMINE SULFATE 0.125 MG SL SUBL
0.2500 mg | SUBLINGUAL_TABLET | Freq: Once | SUBLINGUAL | Status: AC
  Administered 2018-05-13: 0.25 mg via SUBLINGUAL

## 2018-05-13 MED ORDER — LIDOCAINE VISCOUS HCL 2 % MT SOLN
15.0000 mL | Freq: Once | OROMUCOSAL | Status: AC
  Administered 2018-05-13: 15 mL via OROMUCOSAL

## 2018-05-13 MED ORDER — ALUM & MAG HYDROXIDE-SIMETH 200-200-20 MG/5ML PO SUSP
30.0000 mL | Freq: Once | ORAL | Status: AC
  Administered 2018-05-13: 30 mL via ORAL

## 2018-05-13 NOTE — Patient Instructions (Signed)
Indigestion  Indigestion is a feeling of pain, discomfort, burning, or fullness in the upper part of your abdomen. It can come and go. It may occur frequently or rarely. Indigestion tends to occur while you are eating or right after you have finished eating. It may be worse at night and while bending over or lying down. Indigestion may be a symptom of an underlying digestive condition.  Follow these instructions at home:  Eating and drinking     Follow an eating plan as recommended by your health care provider.   Avoid certain foods and drinks as told by your health care provider. This may include:  ? Chocolate and cocoa.  ? Peppermint and mint flavorings.  ? Garlic and onions.  ? Horseradish.  ? Spicy and acidic foods, including peppers, chili powder, curry powder, vinegar, hot sauces, and barbecue sauce.  ? Citrus fruits, such as oranges, lemons, and limes.  ? Tomato-based foods, such as red sauce, chili, salsa, and pizza with red sauce.  ? Fried and fatty foods, such as donuts, french fries, potato chips, and high-fat dressings.  ? High-fat meats, such as hot dogs and fatty cuts of red and white meats, such as rib eye steak, sausage, ham, and bacon.  ? High-fat dairy items, such as whole milk, butter, and cream cheese.  ? Coffee and tea (with or without caffeine).  ? Drinks that contain alcohol.  ? Energy drinks and sports drinks.  ? Carbonated drinks or sodas.  ? Citrus fruit juices.   Eat small, frequent meals instead of large meals.   Avoid drinking large amounts of liquid with your meals.   Avoid eating meals during the 2-3 hours before bedtime.   Avoid lying down right after you eat.   Avoid exercise for 2 hours after you eat.  Lifestyle          Maintain a healthy weight. Ask your health care provider what weight is healthy for you. If you need to lose weight, work with your health care provider to do so safely.   Exercise for at least 30 minutes on 5 or more days each week, or as told by your  health care provider. Avoid exercises that include bending forward. This can make your symptoms worse.   Wear loose-fitting clothing. Do not wear anything tight around your waist that causes pressure on your abdomen.   Do not use any products that contain nicotine or tobacco, such as cigarettes, e-cigarettes, and chewing tobacco. These can make symptoms worse. If you need help quitting, ask your health care provider.   Raise (elevate) the head of your bed about 6 inches (15 cm) when you sleep.   Try to reduce your stress, such as with yoga or meditation. If you need help reducing stress, ask your health care provider.  General instructions   Take over-the-counter and prescription medicines only as told by your health care provider. Do not take aspirin, ibuprofen, or other NSAIDs unless your health care provider told you to do so.   Pay attention to any changes in your symptoms.   Keep all follow-up visits as told by your health care provider. This is important.  Contact a health care provider if:   You have new symptoms.   You have unexplained weight loss.   You have difficulty swallowing, or it hurts to swallow.   Your symptoms do not improve with treatment.   Your symptoms last for more than 2 days.   You have a fever.     You vomit.  Get help right away if:   You have pain in your arms, neck, jaw, teeth, or back.   You feel sweaty, dizzy, or light-headed.   You faint.   You have chest pain or shortness of breath.   You cannot stop vomiting, or you vomit blood.   Your stool is bloody or black.   You have severe pain in your abdomen.  These symptoms may represent a serious problem that is an emergency. Do not wait to see if the symptoms will go away. Get medical help right away. Call your local emergency services (911 in the [place] Do not drive yourself to the hospital.  Summary   Indigestion is a feeling of pain, discomfort, burning, or fullness in the upper part of your abdomen. It tends to  occur while you are eating or right after you have finished eating.   Follow an eating plan and other lifestyle changes as told by your health care provider.   Take over-the-counter and prescription medicines only as told by your health care provider. Do not take aspirin, ibuprofen, or other NSAIDs unless your health care provider told you to do so.   Contact your health care provider if your symptoms do not get better or they get worse. Some symptoms may represent a serious problem that is an emergency. Do not wait to see if the symptoms will go away. Get medical help right away.  This information is not intended to replace advice given to you by your health care provider. Make sure you discuss any questions you have with your health care provider.  Document Released: 04/25/2004 Document Revised: 08/18/2017 Document Reviewed: 08/18/2017  Elsevier Interactive Patient Education  2019 [employer]

## 2018-05-13 NOTE — Progress Notes (Signed)
Patient ID: Kathleen Jordan, female    DOB: [birth year]  Age: 48 y.o. MRN: [id number]    Subjective:  Subjective  HPI Kathleen Jordan here c/o RUQ pain x 2 weeks-- it started with pain after eating but now hurts all the time.  She is burping more    No NVD, no fever.  No chest pain  Review of Systems  Constitutional: Negative for appetite change, diaphoresis, fatigue and unexpected weight change.  Eyes: Negative for pain, redness and visual disturbance.  Respiratory: Negative for cough, chest tightness, shortness of breath and wheezing.   Cardiovascular: Negative for chest pain, palpitations and leg swelling.  Gastrointestinal: Positive for abdominal pain. Negative for constipation, diarrhea and nausea.  Endocrine: Negative for cold intolerance, heat intolerance, polydipsia, polyphagia and polyuria.  Genitourinary: Negative for difficulty urinating, dysuria and frequency.  Neurological: Negative for dizziness, light-headedness, numbness and headaches.    History Past Medical History:  Diagnosis Date  . Allergic rhinitis   . Anal fissure 07/15/2017  . Chronic constipation   . Frequency of urination   . GERD (gastroesophageal reflux disease)   . History of palpitations   . Hx of varicella   . Hyperlipidemia   . Hypertension   . Menorrhagia   . OSA (obstructive sleep apnea)    08-07-2017 no cpap due to finanaces  . PCOS (polycystic ovarian syndrome)   . Peripheral neuropathy   . Thyroid goiter   . Type 2 diabetes mellitus with hyperglycemia, with long-term current use of insulin [facility])    endocrinologist-  dr balan--  08-07-2017 per pt AM cbg's run 150-280  . Urgency of urination     She has a past surgical history that includes Breast reduction surgery (Bilateral, 07-18-2003   dr Shon Hough  [facility]); invitro fertilization (11/16/2010); Colonoscopy with propofol (N/A, 07/15/2017); and Dilatation & currettage/hysteroscopy with hydrothermal ablation (N/A, 03/06/2018).    Her family history includes Allergies in her sister; Arthritis in her maternal grandmother and mother; Asthma in her sister; Cancer in her maternal grandfather and maternal grandmother; Diabetes in her brother, maternal grandmother, and mother; Heart disease (age of onset: 81) in her mother; Hypertension in her brother, maternal grandmother, and mother; Stroke in her maternal grandfather and mother.She reports that she has never smoked. She has never used smokeless tobacco. She reports that she does not drink alcohol or use drugs.  Current Outpatient Medications on File Prior to Visit  Medication Sig Dispense Refill  . cetirizine (ZYRTEC) 10 MG tablet Take 1 tablet (10 mg total) by mouth daily. 90 tablet 0  . empagliflozin (JARDIANCE) 10 MG TABS tablet Take 10 mg by mouth every evening.    . ezetimibe (ZETIA) 10 MG tablet TAKE 1 TABLET(10 MG) BY MOUTH DAILY 90 tablet 0  . fluticasone (FLONASE) 50 MCG/ACT nasal spray Place 2 sprays into both nostrils 2 (two) times daily as needed.     . Insulin Glargine (BASAGLAR KWIKPEN) 100 UNIT/ML SOPN Inject 65 Units into the skin 2 (two) times daily.     Marland Kitchen liraglutide (VICTOZA) 18 MG/3ML SOPN Inject 1.2 mg into the skin every morning.    Marland Kitchen NIFEdipine (PROCARDIA XL/NIFEDICAL XL) 60 MG 24 hr tablet TAKE 1 TABLET BY MOUTH DAILY 90 tablet 0   No current facility-administered medications on file prior to visit.      Objective:  Objective  Physical Exam Vitals signs and nursing note reviewed.  Constitutional:      Appearance: She is well-developed.  HENT:  Head: Normocephalic and atraumatic.  Eyes:     Conjunctiva/sclera: Conjunctivae normal.  Neck:     Musculoskeletal: Normal range of motion and neck supple.     Thyroid: No thyromegaly.     Vascular: No carotid bruit or JVD.  Cardiovascular:     Rate and Rhythm: Normal rate and regular rhythm.     Heart sounds: Normal heart sounds. No murmur.  Pulmonary:     Effort: Pulmonary effort is normal.  No respiratory distress.     Breath sounds: Normal breath sounds. No wheezing or rales.  Chest:     Chest wall: No tenderness.  Abdominal:     General: Bowel sounds are normal. There is no distension. There are no signs of injury.     Palpations: Abdomen is soft. There is no shifting dullness, fluid wave, hepatomegaly, splenomegaly, mass or pulsatile mass.     Tenderness: There is abdominal tenderness in the right upper quadrant and epigastric area. There is no right CVA tenderness, left CVA tenderness, guarding or rebound. Negative signs include Murphy's sign.     Hernia: No hernia is present.     Comments: Gi cocktail given with no relief   Neurological:     Mental Status: She is alert and oriented to person, place, and time.    BP 116/78 (BP Location: Left Arm, Cuff Size: Large)   Pulse 84   Temp 98.9 F (37.2 C) (Oral)   Resp 16   Ht 5\' 9"  (1.753 m)   Wt 247 lb 12.8 oz (112.4 kg)   SpO2 97%   BMI 36.59 kg/m  Wt Readings from Last 3 Encounters:  05/13/18 247 lb 12.8 oz (112.4 kg)  03/06/18 243 lb 14.4 oz (110.6 kg)  08/14/17 246 lb 8 oz (111.8 kg)     Lab Results  Component Value Date   WBC 8.0 05/13/2018   HGB 15.0 05/13/2018   HCT 44.4 05/13/2018   PLT 287.0 05/13/2018   GLUCOSE 183 (H) 05/13/2018   CHOL 157 09/19/2016   TRIG 67.0 09/19/2016   HDL 39.20 09/19/2016   LDLDIRECT 139.3 11/20/2010   LDLCALC 104 (H) 09/19/2016   ALT 30 05/13/2018   AST 25 05/13/2018   NA 137 05/13/2018   K 3.7 05/13/2018   CL 101 05/13/2018   CREATININE 0.74 05/13/2018   BUN 12 05/13/2018   CO2 28 05/13/2018   TSH 1.42 09/07/2015   HGBA1C 6.7 (H) 03/02/2018   MICROALBUR 8.1 (H) 09/07/2015    No results found.   Assessment & Plan:  Plan  I have discontinued Kathleen Jordan's ranitidine and ibuprofen. I am also having her start on famotidine. Additionally, I am having her maintain her fluticasone, BASAGLAR KWIKPEN, liraglutide, empagliflozin, cetirizine, ezetimibe, and  NIFEdipine. We administered lidocaine, hyoscyamine, and alum & mag hydroxide-simeth.  Meds ordered this encounter  Medications  . famotidine (PEPCID) 20 MG tablet    Sig: Take 1 tablet (20 mg total) by mouth 2 (two) times daily.    Dispense:  60 tablet    Refill:  5  . lidocaine (XYLOCAINE) 2 % viscous mouth solution 15 mL  . hyoscyamine (LEVSIN SL) SL tablet 0.25 mg  . alum & mag hydroxide-simeth (MAALOX/MYLANTA) 200-200-20 MG/5ML suspension 30 mL    Problem List Items Addressed This Visit    None    Visit Diagnoses    Dyspepsia    -  Primary   Relevant Medications   famotidine (PEPCID) 20 MG tablet   lidocaine (XYLOCAINE)  2 % viscous mouth solution 15 mL (Completed)   hyoscyamine (LEVSIN SL) SL tablet 0.25 mg (Completed)   alum & mag hydroxide-simeth (MAALOX/MYLANTA) 200-200-20 MG/5ML suspension 30 mL (Completed)   Other Relevant Orders   CBC with Differential/Platelet (Completed)   H. pylori antibody, IgG (Completed)   Comprehensive metabolic panel (Completed)   RUQ pain       Relevant Orders   CBC with Differential/Platelet (Completed)   H. pylori antibody, IgG (Completed)   Comprehensive metabolic panel (Completed)   POCT Urinalysis Dipstick (Automated) (Completed)   US Abdomen Limited RUQ   Amylase (Completed)   Lipase (Completed)        Follow-up: Return if symptoms worsen or fail to improve.  Donato SchultzYvonne R Lowne Chase, DO

## 2018-05-14 ENCOUNTER — Encounter (HOSPITAL_COMMUNITY): Payer: Self-pay

## 2018-05-14 ENCOUNTER — Ambulatory Visit (HOSPITAL_BASED_OUTPATIENT_CLINIC_OR_DEPARTMENT_OTHER)
Admission: RE | Admit: 2018-05-14 | Discharge: 2018-05-14 | Disposition: A | Discharge status: Home / Self Care | Payer: Managed Care, Other (non HMO) | Source: Ambulatory Visit | Attending: Family Medicine | Admitting: Family Medicine

## 2018-05-14 DIAGNOSIS — R1011 Right upper quadrant pain: Secondary | ICD-10-CM | POA: Insufficient documentation

## 2018-05-20 ENCOUNTER — Other Ambulatory Visit: Payer: Self-pay

## 2018-05-20 DIAGNOSIS — K824 Cholesterolosis of gallbladder: Secondary | ICD-10-CM

## 2018-06-02 ENCOUNTER — Other Ambulatory Visit: Payer: Self-pay | Admitting: General Surgery

## 2018-06-02 ENCOUNTER — Other Ambulatory Visit (HOSPITAL_COMMUNITY): Payer: Self-pay | Admitting: General Surgery

## 2018-06-02 DIAGNOSIS — K8020 Calculus of gallbladder without cholecystitis without obstruction: Secondary | ICD-10-CM

## 2018-06-12 ENCOUNTER — Encounter: Payer: Self-pay | Admitting: Family Medicine

## 2018-06-12 ENCOUNTER — Other Ambulatory Visit: Payer: Self-pay

## 2018-06-12 ENCOUNTER — Ambulatory Visit (INDEPENDENT_AMBULATORY_CARE_PROVIDER_SITE_OTHER): Payer: Managed Care, Other (non HMO) | Admitting: Family Medicine

## 2018-06-12 VITALS — BP 132/88 | HR 95 | Temp 98.3°F | Resp 12 | Ht 68.5 in | Wt 243.0 lb

## 2018-06-12 DIAGNOSIS — E1169 Type 2 diabetes mellitus with other specified complication: Secondary | ICD-10-CM | POA: Diagnosis not present

## 2018-06-12 DIAGNOSIS — I10 Essential (primary) hypertension: Secondary | ICD-10-CM

## 2018-06-12 DIAGNOSIS — E785 Hyperlipidemia, unspecified: Secondary | ICD-10-CM

## 2018-06-12 DIAGNOSIS — T7840XD Allergy, unspecified, subsequent encounter: Secondary | ICD-10-CM | POA: Diagnosis not present

## 2018-06-12 DIAGNOSIS — E1151 Type 2 diabetes mellitus with diabetic peripheral angiopathy without gangrene: Secondary | ICD-10-CM

## 2018-06-12 DIAGNOSIS — Z0000 Encounter for general adult medical examination without abnormal findings: Secondary | ICD-10-CM | POA: Diagnosis not present

## 2018-06-12 LAB — LIPID PANEL
Cholesterol: 195 mg/dL (ref 0–200)
Cholesterol: 196 mg/dL (ref 0–200)
HDL: 40.9 mg/dL (ref >39.00)
HDL: 41.4 mg/dL (ref >39.00)
LDL Cholesterol: 132 mg/dL — ABNORMAL HIGH (ref 0–99)
LDL Cholesterol: 133 mg/dL — ABNORMAL HIGH (ref 0–99)
NONHDL: 154.87
NonHDL: 153.97
Total CHOL/HDL Ratio: 5
Total CHOL/HDL Ratio: 5
Triglycerides: 108 mg/dL (ref 0.0–149.0)
Triglycerides: 109 mg/dL (ref 0.0–149.0)
VLDL: 21.6 mg/dL (ref 0.0–40.0)
VLDL: 21.8 mg/dL (ref 0.0–40.0)

## 2018-06-12 LAB — MICROALBUMIN / CREATININE URINE RATIO
Creatinine,U: 78.6 mg/dL
Microalb Creat Ratio: 5.1 mg/g (ref 0.0–30.0)
Microalb, Ur: 4 mg/dL — ABNORMAL HIGH (ref 0.0–1.9)

## 2018-06-12 MED ORDER — MONTELUKAST SODIUM 10 MG PO TABS: 10.0000 mg | ORAL_TABLET | Freq: Every day | ORAL | 3 refills | Status: DC

## 2018-06-12 MED ORDER — NIFEDIPINE ER OSMOTIC RELEASE 60 MG PO TB24: 60.0000 mg | ORAL_TABLET | Freq: Every day | ORAL | 1 refills | Status: DC

## 2018-06-12 MED ORDER — LEVOCETIRIZINE DIHYDROCHLORIDE 5 MG PO TABS: 5.0000 mg | ORAL_TABLET | Freq: Every evening | ORAL | 3 refills | Status: DC

## 2018-06-12 NOTE — Patient Instructions (Signed)
Preventive Care 40-64 Years, Female Preventive care refers to lifestyle choices and visits with your health care provider that can promote health and wellness. What does preventive care include?   A yearly physical exam. This is also called an annual well check.  Dental exams once or twice a year.  Routine eye exams. Ask your health care provider how often you should have your eyes checked.  Personal lifestyle choices, including: ? Daily care of your teeth and gums. ? Regular physical activity. ? Eating a healthy diet. ? Avoiding tobacco and drug use. ? Limiting alcohol use. ? Practicing safe sex. ? Taking low-dose aspirin daily starting at age 50. ? Taking vitamin and mineral supplements as recommended by your health care provider. What happens during an annual well check? The services and screenings done by your health care provider during your annual well check will depend on your age, overall health, lifestyle risk factors, and family history of disease. Counseling Your health care provider may ask you questions about your:  Alcohol use.  Tobacco use.  Drug use.  Emotional well-being.  Home and relationship well-being.  Sexual activity.  Eating habits.  Work and work environment.  Method of birth control.  Menstrual cycle.  Pregnancy history. Screening You may have the following tests or measurements:  Height, weight, and BMI.  Blood pressure.  Lipid and cholesterol levels. These may be checked every 5 years, or more frequently if you are over 50 years old.  Skin check.  Lung cancer screening. You may have this screening every year starting at age 55 if you have a 30-pack-year history of smoking and currently smoke or have quit within the past 15 years.  Colorectal cancer screening. All adults should have this screening starting at age 50 and continuing until age 75. Your health care provider may recommend screening at age 45. You will have tests every  1-10 years, depending on your results and the type of screening test. People at increased risk should start screening at an earlier age. Screening tests may include: ? Guaiac-based fecal occult blood testing. ? Fecal immunochemical test (FIT). ? Stool DNA test. ? Virtual colonoscopy. ? Sigmoidoscopy. During this test, a flexible tube with a tiny camera (sigmoidoscope) is used to examine your rectum and lower colon. The sigmoidoscope is inserted through your anus into your rectum and lower colon. ? Colonoscopy. During this test, a long, thin, flexible tube with a tiny camera (colonoscope) is used to examine your entire colon and rectum.  Hepatitis C blood test.  Hepatitis B blood test.  Sexually transmitted disease (STD) testing.  Diabetes screening. This is done by checking your blood sugar (glucose) after you have not eaten for a while (fasting). You may have this done every 1-3 years.  Mammogram. This may be done every 1-2 years. Talk to your health care provider about when you should start having regular mammograms. This may depend on whether you have a family history of breast cancer.  BRCA-related cancer screening. This may be done if you have a family history of breast, ovarian, tubal, or peritoneal cancers.  Pelvic exam and Pap test. This may be done every 3 years starting at age 21. Starting at age 30, this may be done every 5 years if you have a Pap test in combination with an HPV test.  Bone density scan. This is done to screen for osteoporosis. You may have this scan if you are at high risk for osteoporosis. Discuss your test results, treatment options,   and if necessary, the need for more tests with your health care provider. Vaccines Your health care provider may recommend certain vaccines, such as:  Influenza vaccine. This is recommended every year.  Tetanus, diphtheria, and acellular pertussis (Tdap, Td) vaccine. You may need a Td booster every 10 years.  Varicella  vaccine. You may need this if you have not been vaccinated.  Zoster vaccine. You may need this after age 38.  Measles, mumps, and rubella (MMR) vaccine. You may need at least one dose of MMR if you were born in 1957 or later. You may also need a second dose.  Pneumococcal 13-valent conjugate (PCV13) vaccine. You may need this if you have certain conditions and were not previously vaccinated.  Pneumococcal polysaccharide (PPSV23) vaccine. You may need one or two doses if you smoke cigarettes or if you have certain conditions.  Meningococcal vaccine. You may need this if you have certain conditions.  Hepatitis A vaccine. You may need this if you have certain conditions or if you travel or work in places where you may be exposed to hepatitis A.  Hepatitis B vaccine. You may need this if you have certain conditions or if you travel or work in places where you may be exposed to hepatitis B.  Haemophilus influenzae type b (Hib) vaccine. You may need this if you have certain conditions. Talk to your health care provider about which screenings and vaccines you need and how often you need them. This information is not intended to replace advice given to you by your health care provider. Make sure you discuss any questions you have with your health care provider. Document Released: 04/14/2015 Document Revised: 05/08/2017 Document Reviewed: 01/17/2015 Elsevier Interactive Patient Education  2019 [employer].

## 2018-06-12 NOTE — Progress Notes (Signed)
Subjective:     Kathleen Jordan is a 48 y.o. female and is here for a comprehensive physical exam. The patient reports no problems.  Social History   Socioeconomic History  . Marital status: Married    Spouse name: Caryn Bee  . Number of children: 1  . Years of education: Masters  . Highest education level: Not on file  Occupational History  . 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 Financial resource strain: Not on file  . Food insecurity:    Worry: Not on file    Inability: Not on file  . Transportation needs:    Medical: Not on file    Non-medical: Not on file  Tobacco Use  . Smoking status: Never Smoker  . Smokeless tobacco: Never Used  Substance and Sexual Activity  . Alcohol use: No  . Drug use: No  . Sexual activity: Yes    Partners: Male    Birth control/protection: None  Lifestyle  . Physical activity:    Days per week: Not on file    Minutes per session: Not on file  . Stress: Not on file  Relationships  . Social connections:    Talks on phone: Not on file    Gets together: Not on file    Attends religious service: Not on file    Active member of club or organization: Not on file    Attends meetings of clubs or organizations: Not on file    Relationship status: Not on file  . Intimate partner violence:    Fear of current or ex partner: Not on file    Emotionally abused: Not on file    Physically abused: Not on file    Forced sexual activity: Not on file  Other Topics Concern  . Not on file  Social History Narrative   Drinks caffeine 2x a week    Health Maintenance  Topic Date Due  . HEMOGLOBIN A1C  09/01/2018  . OPHTHALMOLOGY EXAM  12/11/2018  . FOOT EXAM  06/12/2019  . URINE MICROALBUMIN  06/12/2019  . PAP SMEAR-Modifier  03/30/2021  . TETANUS/TDAP  11/03/2022  . INFLUENZA VACCINE  Completed  . PNEUMOCOCCAL POLYSACCHARIDE VACCINE AGE 14-64 HIGH RISK   Completed  . HIV Screening  Completed    The following portions of the patient's history were reviewed and updated as appropriate: She  has a past medical history of Allergic rhinitis, Anal fissure (07/15/2017), Chronic constipation, Frequency of urination, GERD (gastroesophageal reflux disease), History of palpitations, varicella, Hyperlipidemia, Hypertension, Menorrhagia, OSA (obstructive sleep apnea), PCOS (polycystic ovarian syndrome), Peripheral neuropathy, Thyroid goiter, Type 2 diabetes mellitus with hyperglycemia, with long-term current use of insulin (HCC), and Urgency of urination. She does not have any pertinent problems on file. She  has a past surgical history that includes Breast reduction surgery (Bilateral, 07-18-2003   dr Shon Hough  [facility]); invitro fertilization (11/16/2010); Colonoscopy with propofol (N/A, 07/15/2017); and Dilatation & currettage/hysteroscopy with hydrothermal ablation (N/A, 03/06/2018). Her family history includes Allergies in her sister; Arthritis in her maternal grandmother and mother; Asthma in her sister; Cancer in her maternal grandfather and maternal grandmother; Congestive Heart Failure in her mother; Diabetes in her brother, maternal grandmother, and mother; Heart disease (age of onset: 15) in her mother; Hypertension in her brother, maternal grandmother, and mother; Stroke in her maternal grandfather and mother. She  reports that she has never smoked. She  has never used smokeless tobacco. She reports that she does not drink alcohol or use drugs. She has a current medication list which includes the following prescription(s): empagliflozin, ezetimibe, famotidine, fluticasone, basaglar kwikpen, liraglutide, nifedipine, levocetirizine, and montelukast. Current Outpatient Medications on File Prior to Visit  Medication Sig Dispense Refill  . empagliflozin (JARDIANCE) 10 MG TABS tablet Take 10 mg by mouth every evening.    . ezetimibe (ZETIA) 10 MG tablet TAKE 1  TABLET(10 MG) BY MOUTH DAILY 90 tablet 0  . famotidine (PEPCID) 20 MG tablet Take 1 tablet (20 mg total) by mouth 2 (two) times daily. 60 tablet 5  . fluticasone (FLONASE) 50 MCG/ACT nasal spray Place 2 sprays into both nostrils 2 (two) times daily as needed.     . Insulin Glargine (BASAGLAR KWIKPEN) 100 UNIT/ML SOPN Inject 65 Units into the skin 2 (two) times daily.     Marland Kitchen liraglutide (VICTOZA) 18 MG/3ML SOPN Inject 1.2 mg into the skin every morning.     No current facility-administered medications on file prior to visit.    She has No Known Allergies..  Review of Systems Review of Systems  Constitutional: Negative for activity change, appetite change and fatigue.  HENT: Negative for hearing loss, congestion, tinnitus and ear discharge.  dentist q45m Eyes: Negative for visual disturbance (see optho q1y -- vision corrected to 20/20 with glasses).  Respiratory: Negative for cough, chest tightness and shortness of breath.   Cardiovascular: Negative for chest pain, palpitations and leg swelling.  Gastrointestinal: Negative for abdominal pain, diarrhea, constipation and abdominal distention.  Genitourinary: Negative for urgency, frequency, decreased urine volume and difficulty urinating.  Musculoskeletal: Negative for back pain, arthralgias and gait problem.  Skin: Negative for color change, pallor and rash.  Neurological: Negative for dizziness, light-headedness, numbness and headaches.  Hematological: Negative for adenopathy. Does not bruise/bleed easily.  Psychiatric/Behavioral: Negative for suicidal ideas, confusion, sleep disturbance, self-injury, dysphoric mood, decreased concentration and agitation.       Objective:    BP 132/88 (BP Location: Right Arm, Patient Position: Sitting, Cuff Size: Normal)   Pulse 95   Temp 98.3 F (36.8 C)   Resp 12   Ht 5' 8.5" (1.74 m)   Wt 243 lb (110.2 kg)   SpO2 100%   BMI 36.41 kg/m  General appearance: alert, cooperative, appears stated age  and no distress Head: Normocephalic, without obvious abnormality, atraumatic Eyes: conjunctivae/corneas clear. PERRL, EOM's intact. Fundi benign. Ears: normal TM's and external ear canals both ears Nose: Nares normal. Septum midline. Mucosa normal. No drainage or sinus tenderness. Throat: lips, mucosa, and tongue normal; teeth and gums normal Neck: no adenopathy, no carotid bruit, no JVD, supple, symmetrical, trachea midline and thyroid not enlarged, symmetric, no tenderness/mass/nodules Back: symmetric, no curvature. ROM normal. No CVA tenderness. Lungs: clear to auscultation bilaterally Breasts: normal appearance, no masses or tenderness Heart: regular rate and rhythm, S1, S2 normal, no murmur, click, rub or gallop Abdomen: soft, non-tender; bowel sounds normal; no masses,  no organomegaly Pelvic: deferred Extremities: extremities normal, atraumatic, no cyanosis or edema Pulses: 2+ and symmetric Skin: Skin color, texture, turgor normal. No rashes or lesions Lymph nodes: Cervical, supraclavicular, and axillary nodes normal. Neurologic: Alert and oriented X 3, normal strength and tone. Normal symmetric reflexes. Normal coordination and gait    Diabetic Foot Exam - Simple   Simple Foot Form Diabetic Foot exam was performed with the following findings:  Yes 06/12/2018  2:13 PM  Visual Inspection No deformities, no ulcerations, no  other skin breakdown bilaterally:  Yes Sensation Testing Intact to touch and monofilament testing bilaterally:  Yes Pulse Check Posterior Tibialis and Dorsalis pulse intact bilaterally:  Yes Comments    Assessment:    Healthy female exam.      Plan:    ghm utd Check labs  See After Visit Summary for Counseling Recommendations    1. Preventative health care See above See other labs  - Lipid panel - Microalbumin / creatinine urine ratio  2. Allergic state, subsequent encounter stable - montelukast (SINGULAIR) 10 MG tablet; Take 1 tablet (10 mg  total) by mouth at bedtime.  Dispense: 90 tablet; Refill: 3 - levocetirizine (XYZAL) 5 MG tablet; Take 1 tablet (5 mg total) by mouth every evening.  Dispense: 90 tablet; Refill: 3  3. Essential hypertension Well controlled, no changes to meds. Encouraged heart healthy diet such as the DASH diet and exercise as tolerated.  - NIFEdipine (PROCARDIA XL/NIFEDICAL XL) 60 MG 24 hr tablet; Take 1 tablet (60 mg total) by mouth daily.  Dispense: 90 tablet; Refill: 1 - Lipid panel - Microalbumin / creatinine urine ratio  4. Hyperlipidemia associated with type 2 diabetes mellitus (HCC) Tolerating statin, encouraged heart healthy diet, avoid trans fats, minimize simple carbs and saturated fats. Increase exercise as tolerated - Lipid panel - Lipid panel - Microalbumin / creatinine urine ratio  5. DM (diabetes mellitus) type II, controlled, with peripheral vascular disorder (HCC) hgba1c to be checked, minimize simple carbs. Increase exercise as tolerated. Continue current meds  - Lipid panel - Microalbumin / creatinine urine ratio

## 2018-06-13 DIAGNOSIS — E1169 Type 2 diabetes mellitus with other specified complication: Secondary | ICD-10-CM | POA: Insufficient documentation

## 2018-06-13 DIAGNOSIS — E785 Hyperlipidemia, unspecified: Secondary | ICD-10-CM

## 2018-06-13 NOTE — Assessment & Plan Note (Signed)
Tolerating statin, encouraged heart healthy diet, avoid trans fats, minimize simple carbs and saturated fats. Increase exercise as tolerated 

## 2018-06-13 NOTE — Assessment & Plan Note (Signed)
Well controlled, no changes to meds. Encouraged heart healthy diet such as the DASH diet and exercise as tolerated.  °

## 2018-06-18 ENCOUNTER — Other Ambulatory Visit: Payer: Self-pay | Admitting: Family Medicine

## 2018-06-18 MED ORDER — ROSUVASTATIN CALCIUM 10 MG PO TABS: 10.0000 mg | ORAL_TABLET | Freq: Every day | ORAL | 2 refills | Status: DC

## 2018-06-18 NOTE — Progress Notes (Signed)
Medication Ordered per Dr. Zola Button.Marland Kitchen

## 2018-07-03 ENCOUNTER — Encounter (HOSPITAL_COMMUNITY): Payer: Self-pay

## 2018-07-03 ENCOUNTER — Ambulatory Visit (HOSPITAL_COMMUNITY): Admission: RE | Admit: 2018-07-03 | Payer: Managed Care, Other (non HMO) | Source: Ambulatory Visit

## 2018-07-14 ENCOUNTER — Telehealth: Payer: Managed Care, Other (non HMO) | Admitting: Physician Assistant

## 2018-07-14 DIAGNOSIS — R059 Cough, unspecified: Secondary | ICD-10-CM

## 2018-07-14 DIAGNOSIS — M791 Myalgia, unspecified site: Secondary | ICD-10-CM

## 2018-07-14 DIAGNOSIS — R05 Cough: Secondary | ICD-10-CM

## 2018-07-14 NOTE — Progress Notes (Signed)
E-Visit for Corona Virus Screening  Based on your current symptoms, you may very well have the virus, however your symptoms are mild. Currently, not all patients are being tested. If the symptoms are mild and there is not a known exposure, performing the test is not indicated.  Coronavirus disease 2019 (COVID-19)is a respiratory illness that can spread from person to person. The virus that causes COVID-19 is a new virus that was first identified in the country of [place] but is now found in multiple other countries and has spread to the [place].  Symptoms associated with the virus are mild to severe fever, cough, and shortness of breath. There is currently no vaccine to protect against COVID-19, and there is no specific antiviral treatment for the virus.   To be considered HIGH RISK for Coronavirus (COVID-19), you have to meet the following criteria:  . Traveled to [place], [place], [place], [place] or [place]; or in the [place] to [place], [place], [place], or [place]; and have fever, cough, and shortness of breath within the last 2 weeks of travel OR  . Been in close contact with a person diagnosed with COVID-19 within the last 2 weeks and have fever, cough, and shortness of breath  . IF YOU DO NOT MEET THESE CRITERIA, YOU ARE CONSIDERED LOW RISK FOR COVID-19.   It is vitally important that if you feel that you have an infection such as this virus or any other virus that you stay home and away from places where you may spread it to others.  You should self-quarantine for 14 days if you have symptoms that could potentially be coronavirus and avoid contact with people age 18 and older.    You may take acetaminophen (Tylenol) as needed for fever.   Reduce your risk of any infection by using the same precautions used for avoiding the common cold or flu: Wash your hands often with soap and warm water for at least 20 seconds.  If soap and water are not readily available, use an  alcohol-based hand sanitizer with at least 60% alcohol.  If coughing or sneezing, cover your mouth and nose by coughing or sneezing into the elbow areas of your shirt or coat, into a tissue or into your sleeve (not your hands). Avoid shaking hands with others and consider head nods or verbal greetings only.  Avoid touching your eyes,nose, or mouth with unwashed hands. Avoid close contact with people who are sick. Avoid places or events with large numbers of people in one location, like concerts or sporting events. Carefully consider travel plans you have or are making. If you are planning any travel outside or inside the [place], visit the CDC'sTravelers' Health webpagefor the latest health notices. If you have some symptoms but not all symptoms, continue to monitor at home and seek medical attention if your symptoms worsen. If you are having a medical emergency, call 911.  HOME CARE Only take medications as instructed by your medical team. Drink plenty of fluids and get plenty of rest. A steam or ultrasonic humidifier can help if you have congestion.   GET HELP RIGHT AWAY IF: You develop worsening fever. You become short of breath You cough up blood. Your symptoms become more severe MAKE SURE YOU  Understand these instructions. Will watch your condition. Will get help right away if you are not doing well or get worse.  Your e-visit answers were reviewed by a board certified advanced clinical practitioner to complete your personal  care plan.  Depending on the condition, your plan could have included both over the counter or prescription medications.  If there is a problem please reply once you have received a response from your provider. Your safety is important to [place].  If you have drug allergies check your prescription carefully.    You can use MyChart to ask questions about today's visit, request a non-urgent call back, or ask for a work or school excuse for 24 hours related to this  e-Visit. If it has been greater than 24 hours you will need to follow up with your provider, or enter a new e-Visit to address those concerns. You will get an e-mail in the next two days asking about your experience.  I hope that your e-visit has been valuable and will speed your recovery. Thank you for using e-visits.    ===View-only below this line===   ----- Message -----    From: Posey Pronto    Sent: 07/14/2018 10:27 AM EDT      To: E-Visit Mailing List Subject: E-Visit Submission: CoronaVirus (COVID-19) Screening  E-Visit Submission: CoronaVirus (COVID-19) Screening --------------------------------  Question: Do you have any of the following?  Answer:   Cough  Question: Do you have any of the following additional symptoms?  Answer:   Sore throat            Body aches            Headache  Question: Have you had a fever? Answer:   No  Question: Have others in your home or workplace had similar symptoms? Answer:   No  Question: When did your symptoms start? Answer:   07/11/2018  Question: Have you recently visited any of the following countries? Answer:   None of these  Question: If you have traveled anywhere in the last  2 months please document where you have visited: Answer:     Question: Have you recently been around others from these countries or visited these countries who have had coughing or fever? Answer:   No  Question: Have you recently been around anyone who has been diagnosed with Corona virus? Answer:   No  Question: Have you been taking any medications? Answer:   No  Question: If taking medications for these symptoms, please list the names and whether they are helping or not Answer:     Question: Are you treated for any of the following conditions: Asthma, COPD, Diabetes, Renal Failure (on Dialysis), AIDS, any Neuromuscular disease that effects the clearing of secretions, Heart Failure, or Heart Disease? Answer:   Yes  Question:  Please enter a phone number where you can be reached if we have additional questions about your symptoms Answer:   [phone]  Question: Please list your medication allergies that you may have ? (If 'none' , please list as 'none') Answer:   none  Question: Please list any additional comments  Answer:     Question: Are you pregnant? Answer:   I am confident that I am not pregnant  Question: Are you breastfeeding? Answer:   No  A total of 5-10 minutes was spent evaluating this patients questionnaire and formulating a plan of care.

## 2018-07-16 ENCOUNTER — Ambulatory Visit: Payer: Self-pay

## 2018-07-16 NOTE — Telephone Encounter (Signed)
Patient called and says she did an e-visit and was told to schedule a video visit with Dr. Zola Button for her respiratory symptoms. She says she's been having mild SOB only when moving around since Monday, body aches, no fever, mild cough. She says she hasn't traveled or been around anyone with COVID-19. She says she's been having pain in her sinuses, a runny nose and chest pain off and on. I called the office and spoke to Clydie Braun, [facility] who asked to speak to the patient, the call was connected successfully.  Answer Assessment - Initial Assessment Questions 1. RESPIRATORY STATUS: "Describe your breathing?" (e.g., wheezing, shortness of breath, unable to speak, severe coughing)      Shortness of breath only when moving around, some coughing 2. ONSET: "When did this breathing problem begin?"      Monday 3. PATTERN "Does the difficult breathing come and go, or has it been constant since it started?"      Comes and goes only with activity 4. SEVERITY: "How bad is your breathing?" (e.g., mild, moderate, severe)    - MILD: No SOB at rest, mild SOB with walking, speaks normally in sentences, can lay down, no retractions, pulse < 100.    - MODERATE: SOB at rest, SOB with minimal exertion and prefers to sit, cannot lie down flat, speaks in phrases, mild retractions, audible wheezing, pulse 100-120.    - SEVERE: Very SOB at rest, speaks in single words, struggling to breathe, sitting hunched forward, retractions, pulse > 120      Mild 5. RECURRENT SYMPTOM: "Have you had difficulty breathing before?" If so, ask: "When was the last time?" and "What happened that time?"      No 6. CARDIAC HISTORY: "Do you have any history of heart disease?" (e.g., heart attack, angina, bypass surgery, angioplasty)     No 7. LUNG HISTORY: "Do you have any history of lung disease?"  (e.g., pulmonary embolus, asthma, emphysema)     No 8. CAUSE: "What do you think is causing the breathing problem?"      I don't know 9. OTHER  SYMPTOMS: "Do you have any other symptoms? (e.g., dizziness, runny nose, cough, chest pain, fever)     Pain in sinuses, runny nose, little cough, chest pain off and on 10. PREGNANCY: "Is there any chance you are pregnant?" "When was your last menstrual period?"       No 11. TRAVEL: "Have you traveled out of the country in the last month?" (e.g., travel history, exposures)       No  Protocols used: BREATHING DIFFICULTY-A-AH

## 2018-07-16 NOTE — Telephone Encounter (Signed)
App tomorrow made

## 2018-07-16 NOTE — Telephone Encounter (Signed)
FYI

## 2018-07-17 ENCOUNTER — Ambulatory Visit (INDEPENDENT_AMBULATORY_CARE_PROVIDER_SITE_OTHER): Payer: Managed Care, Other (non HMO) | Admitting: Family Medicine

## 2018-07-17 ENCOUNTER — Encounter: Payer: Self-pay | Admitting: Family Medicine

## 2018-07-17 ENCOUNTER — Other Ambulatory Visit: Payer: Self-pay

## 2018-07-17 DIAGNOSIS — J324 Chronic pansinusitis: Secondary | ICD-10-CM

## 2018-07-17 DIAGNOSIS — T7840XD Allergy, unspecified, subsequent encounter: Secondary | ICD-10-CM

## 2018-07-17 DIAGNOSIS — R1013 Epigastric pain: Secondary | ICD-10-CM | POA: Diagnosis not present

## 2018-07-17 MED ORDER — AMOXICILLIN-POT CLAVULANATE 875-125 MG PO TABS: 1.0000 | ORAL_TABLET | Freq: Two times a day (BID) | ORAL | 0 refills | Status: DC

## 2018-07-17 MED ORDER — LEVOCETIRIZINE DIHYDROCHLORIDE 5 MG PO TABS: 5.0000 mg | ORAL_TABLET | Freq: Every evening | ORAL | 3 refills | Status: DC

## 2018-07-17 MED ORDER — PANTOPRAZOLE SODIUM 40 MG PO TBEC: 40.0000 mg | DELAYED_RELEASE_TABLET | Freq: Every day | ORAL | 3 refills | Status: DC

## 2018-07-17 NOTE — Progress Notes (Signed)
Virtual Visit via Video Note  I connected with Kathleen Jordan on 07/17/18 at 10:30 AM EDT by a video enabled telemedicine application and verified that I am speaking with the correct person using two identifiers.   I discussed the limitations of evaluation and management by telemedicine and the availability of in person appointments. The patient expressed understanding and agreed to proceed.  History of Present Illness: Pt had chest pain and sob Monday.  She had an e visit and yesterday she did a second e visit   + muscle aches ,  She never had a fever  Some pnd and cough in am   Today she is better,  No chest pain, + myalgias   No sob today She has had gerd symptoms too.     Observations/Objective: 97.6 temp,  141/90  94 rr normal Pt is in NAD  Assessment and Plan: 1. Dyspepsia meds per orders Avoid acidic foods, spicy foods , peppermint , caffeine  - pantoprazole (PROTONIX) 40 MG tablet; Take 1 tablet (40 mg total) by mouth daily.  Dispense: 30 tablet; Refill: 3  2. Allergic state, subsequent encounter  - levocetirizine (XYZAL) 5 MG tablet; Take 1 tablet (5 mg total) by mouth every evening.  Dispense: 90 tablet; Refill: 3  3. Pansinusitis, unspecified chronicity con't flonase and xyzal  - amoxicillin-clavulanate (AUGMENTIN) 875-125 MG tablet; Take 1 tablet by mouth 2 (two) times daily.  Dispense: 20 tablet; Refill: 0  After e visit provider felt she was high risk for covid --  Pt does have signs and symptoms for sinus infection Pt given a note to quarantine for 2 weeks with husband and son   Follow Up Instructions:    I discussed the assessment and treatment plan with the patient. The patient was provided an opportunity to ask questions and all were answered. The patient agreed with the plan and demonstrated an understanding of the instructions.   The patient was advised to call back or seek an in-person evaluation if the symptoms worsen or if the condition fails to  improve as anticipated.  I provided 25 minutes of non-face-to-face time during this encounter.   [name], DO

## 2018-07-21 ENCOUNTER — Other Ambulatory Visit: Payer: Self-pay | Admitting: Family Medicine

## 2018-08-18 ENCOUNTER — Other Ambulatory Visit: Payer: Self-pay | Admitting: Family Medicine

## 2018-08-18 DIAGNOSIS — I10 Essential (primary) hypertension: Secondary | ICD-10-CM

## 2018-10-05 ENCOUNTER — Ambulatory Visit: Payer: Self-pay | Admitting: Family Medicine

## 2018-10-05 NOTE — Telephone Encounter (Signed)
Pt. Reports her son has had a COVID 19 exposure and has been tested. She is requesting to be tested. Please advise pt. No answer in practice.  Answer Assessment - Initial Assessment Questions 1. CLOSE CONTACT: "Who is the person with the confirmed or suspected COVID-19 infection that you were exposed to?"     No test 2. PLACE of CONTACT: "Where were you when you were exposed to COVID-19?" (e.g., home, school, medical waiting room; which city?)     Son who lives with you 3. TYPE of CONTACT: "How much contact was there?" (e.g., sitting next to, live in same house, work in same office, same building)     Same home 4. DURATION of CONTACT: "How long were you in contact with the COVID-19 patient?" (e.g., a few seconds, passed by person, a few minutes, live with the patient)     Live together 5. DATE of CONTACT: "When did you have contact with a COVID-19 patient?" (e.g., how many days ago)     June 12 6. TRAVEL: "Have you traveled out of the country recently?" If so, "When and where?"     * Also ask about out-of-state travel, since the CDC has identified some high-risk cities for community spread in the [place].     * Note: Travel becomes less relevant if there is widespread community transmission where the patient lives.     No 7. COMMUNITY SPREAD: "Are there lots of cases of COVID-19 (community spread) where you live?" (See public health department website, if unsure)       Yes 8. SYMPTOMS: "Do you have any symptoms?" (e.g., fever, cough, breathing difficulty)     No 9. PREGNANCY OR POSTPARTUM: "Is there any chance you are pregnant?" "When was your last menstrual period?" "Did you deliver in the last 2 weeks?"     No 10. HIGH RISK: "Do you have any heart or lung problems? Do you have a weak immune system?" (e.g., CHF, COPD, asthma, HIV positive, chemotherapy, renal failure, diabetes mellitus, sickle cell anemia)       Diabetes  Protocols used: CORONAVIRUS (COVID-19) EXPOSURE-A-AH

## 2018-10-05 NOTE — Telephone Encounter (Signed)
Needs virtual visit 

## 2018-10-06 ENCOUNTER — Ambulatory Visit (INDEPENDENT_AMBULATORY_CARE_PROVIDER_SITE_OTHER): Payer: Managed Care, Other (non HMO) | Admitting: Medical

## 2018-10-06 ENCOUNTER — Encounter: Payer: Self-pay | Admitting: Medical

## 2018-10-06 ENCOUNTER — Telehealth: Payer: Self-pay | Admitting: Medical

## 2018-10-06 ENCOUNTER — Other Ambulatory Visit: Payer: Self-pay

## 2018-10-06 VITALS — BP 137/85 | HR 101

## 2018-10-06 DIAGNOSIS — J3089 Other allergic rhinitis: Secondary | ICD-10-CM | POA: Diagnosis not present

## 2018-10-06 DIAGNOSIS — M791 Myalgia, unspecified site: Secondary | ICD-10-CM

## 2018-10-06 DIAGNOSIS — J0100 Acute maxillary sinusitis, unspecified: Secondary | ICD-10-CM

## 2018-10-06 MED ORDER — AZITHROMYCIN 250 MG PO TABS: ORAL_TABLET | ORAL | 0 refills | Status: DC

## 2018-10-06 MED ORDER — AZELASTINE HCL 0.1 % NA SOLN: 2.0000 | Freq: Two times a day (BID) | NASAL | 2 refills | Status: DC

## 2018-10-06 MED ORDER — BENZONATATE 100 MG PO CAPS: 100.0000 mg | ORAL_CAPSULE | Freq: Three times a day (TID) | ORAL | 0 refills | Status: DC | PRN

## 2018-10-06 NOTE — Patient Instructions (Signed)
Patient has history of allergic rhinitis and sinus infection.  Presently overall sounds like she might have had a flare of allergies followed by sinus infection.  She does have sinus pressure when she palpates over her maxillary sinuses.  Will add Astelin nasal spray to her current allergy medication regimen.  Also sending in a azithromycin antibiotic for potential sinus infection.  She does however have some potential exposure to COVID and is describing currently some diffuse myalgias along with others above symptoms.  So will arrange for her to get COVID testing.  During the interim I am going to write her a work note excuse and send it to my chart.  Patient counseled to contact us if signs and symptoms worsen as discussed.  Explained to contact his with the symptoms even if her [place] test came back negative as sometimes test can be falsely negative early on.  Patient expressed understanding.  Follow-up in 7 days virtual visit or sooner if needed.

## 2018-10-06 NOTE — Progress Notes (Signed)
Subjective:    Patient ID: Kathleen Jordan, female    DOB: 1971/03/25, 48 y.o.   MRN: [id number]  HPI  Virtual Visit via Video Note  I connected with Kathleen ProntoSyreeta Marie Gaw on 10/06/18 at  2:20 PM EDT by a video enabled telemedicine application and verified that I am speaking with the correct person using two identifiers.  Location: Patient: home Provider: office   I discussed the limitations of evaluation and management by telemedicine and the availability of in person appointments. The patient expressed understanding and agreed to proceed.  History of Present Illness: Pt states her son was in camp recently with some exposure to covid.2 of child fellow campers had covid. Pt son has covid test pending. Her son only has ear pain. No other symptoms for son reported.  Pt states she recently got nasal congestion around Saturday evening. Some sinus pressure.She also got some bodyaches on Saturday. She describes diffuse bodyaches. No diarrhea. Occasional dry cough. Mild scratchy throat.  Pt does have sinus pressure and teeth pain. She gets sinus pressure in the past. Hx of allergies and sinus infections.  No fever, no chills, no sweats and no wheezing.  Pt is in xyzal, flonase and montelukast.     Observations/Objective: General-no acute distress, pleasant, oriented. Lungs- on inspection lungs appear unlabored. Neck- no tracheal deviation or jvd on inspection. Neuro- gross motor function appears intact. heent- maxillary sinus pressure.  When she self palpates.  Assessment and Plan: Patient has history of allergic rhinitis and sinus infection.  Presently overall sounds like she might have had a flare of allergies followed by sinus infection.  She does have sinus pressure when she palpates over her maxillary sinuses.  Will add Astelin nasal spray to her current allergy medication regimen.  Also sending in a azithromycin antibiotic for potential sinus infection.  She does  however have some potential exposure to COVID and is describing currently some diffuse myalgias along with others above symptoms.  So will arrange for her to get COVID testing.  During the interim I am going to write her a work note excuse and send it to my chart.  Patient counseled to contact us if signs and symptoms worsen as discussed.  Explained to contact his with the symptoms even if her [place] test came back negative as sometimes test can be falsely negative early on.  Patient expressed understanding.  Follow-up in 7 days virtual visit or sooner if needed.  Follow Up Instructions:    I discussed the assessment and treatment plan with the patient. The patient was provided an opportunity to ask questions and all were answered. The patient agreed with the plan and demonstrated an understanding of the instructions.   The patient was advised to call back or seek an in-person evaluation if the symptoms worsen or if the condition fails to improve as anticipated.  I provided 20 minutes of non-face-to-face time during this encounter.   Esperanza RichtersEdward Jeaneen Cala, PA-C    Review of Systems  Constitutional: Negative for chills, fatigue and fever.  HENT: Positive for congestion, postnasal drip, sinus pressure, sinus pain and sore throat.   Respiratory: Positive for cough.   Cardiovascular: Negative for chest pain and palpitations.  Gastrointestinal: Negative for abdominal pain.  Musculoskeletal: Positive for myalgias. Negative for back pain.  Neurological: Negative for dizziness and headaches.  Hematological: Negative for adenopathy. Does not bruise/bleed easily.    Past Medical History:  Diagnosis Date  . Allergic rhinitis   . Anal fissure 07/15/2017  .  Chronic constipation   . Frequency of urination   . GERD (gastroesophageal reflux disease)   . History of palpitations   . Hx of varicella   . Hyperlipidemia   . Hypertension   . Menorrhagia   . OSA (obstructive sleep apnea)    08-07-2017  no cpap due to finanaces  . PCOS (polycystic ovarian syndrome)   . Peripheral neuropathy   . Thyroid goiter   . Type 2 diabetes mellitus with hyperglycemia, with long-term current use of insulin [facility](HCC)    endocrinologist-  dr balan--  08-07-2017 per pt AM cbg's run 150-280  . Urgency of urination      Social History   Socioeconomic History  . Marital status: Married    Spouse name: [name]  . Number of children: 1  . Years of education: Masters  . Highest education level: Not on file  Occupational History  . Occupation: Orthoptistsychotherapist    Employer: [employer]    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 [place]  Social Needs  . Financial resource strain: Not on file  . Food insecurity    Worry: Not on file    Inability: Not on file  . Transportation needs    Medical: Not on file    Non-medical: Not on file  Tobacco Use  . Smoking status: Never Smoker  . Smokeless tobacco: Never Used  Substance and Sexual Activity  . Alcohol use: No  . Drug use: No  . Sexual activity: Yes    Partners: Male    Birth control/protection: None  Lifestyle  . Physical activity    Days per week: Not on file    Minutes per session: Not on file  . Stress: Not on file  Relationships  . Social Musicianconnections    Talks on phone: Not on file    Gets together: Not on file    Attends religious service: Not on file    Active member of club or organization: Not on file    Attends meetings of clubs or organizations: Not on file    Relationship status: Not on file  . Intimate partner violence    Fear of current or ex partner: Not on file    Emotionally abused: Not on file    Physically abused: Not on file    Forced sexual activity: Not on file  Other Topics Concern  . Not on file  Social History Narrative   Drinks caffeine 2x a week     Past Surgical History:  Procedure Laterality Date  . BREAST REDUCTION SURGERY Bilateral 07-18-2003   dr [place]  [facility]  . COLONOSCOPY WITH PROPOFOL N/A  07/15/2017   Procedure: COLONOSCOPY WITH PROPOFOL;  Surgeon: Hilarie FredricksonPerry, John N, MD;  Location: [facility];  Service: Endoscopy;  Laterality: N/A;  . DILITATION & CURRETTAGE/HYSTROSCOPY WITH HYDROTHERMAL ABLATION N/A 03/06/2018   Procedure: DILATATION & CURETTAGE/HYSTEROSCOPY WITH HYDROTHERMAL ABLATION, INSERTION OF INTRAUTERINE DEVICE;  Surgeon: Hal MoralesHaygood, Vanessa P, MD;  Location: [facility];  Service: Gynecology;  Laterality: N/A;  . inv[facility]  [facility]    Family History  Problem Relation Age of Onset  . Arthritis Mother   . Diabetes Mother   . Hypertension Mother   . Heart disease Mother [id number]       MI  . Stroke Mother   . Congestive Heart Failure Mother   . Diabetes Brother   . Hypertension Brother   . Arthritis Maternal Grandmother   . Diabetes Maternal  Grandmother   . Hypertension Maternal Grandmother   . Cancer Maternal Grandmother   . Stroke Maternal Grandfather   . Cancer Maternal Grandfather   . Asthma Sister   . Allergies Sister     No Known Allergies  Current Outpatient Medications on File Prior to Visit  Medication Sig Dispense Refill  . amoxicillin-clavulanate (AUGMENTIN) 875-125 MG tablet Take 1 tablet by mouth 2 (two) times daily. 20 tablet 0  . empagliflozin (JARDIANCE) 10 MG TABS tablet Take 10 mg by mouth every evening.    . ezetimibe (ZETIA) 10 MG tablet TAKE 1 TABLET(10 MG) BY MOUTH DAILY 90 tablet 1  . famotidine (PEPCID) 20 MG tablet Take 1 tablet (20 mg total) by mouth 2 (two) times daily. 60 tablet 5  . fluticasone (FLONASE) 50 MCG/ACT nasal spray Place 2 sprays into both nostrils 2 (two) times daily as needed.     . Insulin Glargine (BASAGLAR KWIKPEN) 100 UNIT/ML SOPN Inject 65 Units into the skin 2 (two) times daily.     Marland Kitchen levocetirizine (XYZAL) 5 MG tablet Take 1 tablet (5 mg total) by mouth every evening. 90 tablet 3  . liraglutide (VICTOZA) 18 MG/3ML SOPN Inject 1.2 mg into the skin every morning.    .  montelukast (SINGULAIR) 10 MG tablet Take 1 tablet (10 mg total) by mouth at bedtime. 90 tablet 3  . NIFEdipine (PROCARDIA XL/NIFEDICAL XL) 60 MG 24 hr tablet Take 1 tablet (60 mg total) by mouth daily. 90 tablet 1  . pantoprazole (PROTONIX) 40 MG tablet Take 1 tablet (40 mg total) by mouth daily. 30 tablet 3  . rosuvastatin (CRESTOR) 10 MG tablet Take 1 tablet (10 mg total) by mouth at bedtime. 30 tablet 2   No current facility-administered medications on file prior to visit.     BP 137/85   Pulse (!) 101       Objective:   Physical Exam        Assessment & Plan:

## 2018-10-06 NOTE — Telephone Encounter (Signed)
Pt had exposure to covid. Recent myalgia, cough, and st. Please get her nasal [facility]ab [facility] Please notify me of the results.

## 2018-10-06 NOTE — Addendum Note (Signed)
Addended by: Anabel Halon on: 10/06/2018 02:28 PM   Modules accepted: Orders

## 2018-10-07 ENCOUNTER — Encounter: Payer: Self-pay | Admitting: Medical

## 2018-10-07 ENCOUNTER — Telehealth: Payer: Self-pay | Admitting: Medical

## 2018-10-07 NOTE — Telephone Encounter (Signed)
Pt had exposure to covid. Recent myalgia, cough, and st. Please get her nasal swab [facility]. Please notify me of the results. 

## 2018-10-08 ENCOUNTER — Encounter: Payer: Self-pay | Admitting: *Deleted

## 2018-10-08 ENCOUNTER — Other Ambulatory Visit: Payer: Managed Care, Other (non HMO)

## 2018-10-08 ENCOUNTER — Telehealth: Payer: Self-pay | Admitting: Medical

## 2018-10-08 ENCOUNTER — Telehealth: Payer: Self-pay

## 2018-10-08 DIAGNOSIS — Z20822 Contact with and (suspected) exposure to covid-19: Secondary | ICD-10-CM

## 2018-10-08 MED ORDER — BUSPIRONE HCL 7.5 MG PO TABS: 7.5000 mg | ORAL_TABLET | Freq: Two times a day (BID) | ORAL | 0 refills | Status: DC

## 2018-10-08 NOTE — Telephone Encounter (Signed)
Opened to review 

## 2018-10-08 NOTE — Telephone Encounter (Signed)
This encounter was created in error - please disregard.

## 2018-10-08 NOTE — Telephone Encounter (Signed)
Opened to cancel buspar written by accident. Contacted pharmacy.

## 2018-10-08 NOTE — Telephone Encounter (Signed)
Patient called to scheudule covid testing. Appointment scheduled for today at [facility], she says she will be there before 2 pm. Advised of location and to wear a mask for everyone in the vehicle, she verbalized understanding. Order placed.    Saguier, Percell Miller, PA-C 2 days ago     Pt had exposure to covid. Recent myalgia, cough, and st. Please get her nasal swab covid [facility]. Please notify me of the results

## 2018-10-12 LAB — NOVEL CORONAVIRUS, NAA: SARS-CoV-2, NAA: NOT DETECTED

## 2018-10-27 ENCOUNTER — Encounter: Payer: Self-pay | Admitting: Family Medicine

## 2018-11-19 ENCOUNTER — Encounter: Payer: Self-pay | Admitting: Family Medicine

## 2018-11-25 ENCOUNTER — Ambulatory Visit (HOSPITAL_COMMUNITY)
Admission: RE | Admit: 2018-11-25 | Discharge: 2018-11-25 | Disposition: A | Discharge status: Home / Self Care | Payer: Managed Care, Other (non HMO) | Source: Ambulatory Visit | Attending: General Surgery | Admitting: General Surgery

## 2018-11-25 ENCOUNTER — Other Ambulatory Visit: Payer: Self-pay

## 2018-11-25 DIAGNOSIS — K8020 Calculus of gallbladder without cholecystitis without obstruction: Secondary | ICD-10-CM | POA: Diagnosis not present

## 2018-11-25 MED ORDER — TECHNETIUM TC 99M MEBROFENIN IV KIT
5.0000 | PACK | Freq: Once | INTRAVENOUS | Status: AC | PRN
  Administered 2018-11-25: 12:00:00 5 via INTRAVENOUS

## 2018-12-02 ENCOUNTER — Ambulatory Visit: Payer: Self-pay | Admitting: General Surgery

## 2018-12-02 NOTE — H&P (Signed)
History of Present Illness  The patient is a 48 year old female who presents for evaluation of gall stones. Patient is a 48 year old female who follows back up today secondary to abdominal pain. Patient states that she's continue with her abdominal pain usually with high fatty foods, spicy foods, citric foods to include for. She states that she's had some looser bowel movements. She does state she has some reflux as well. Patient recently underwent HIDA scan which revealed an ejection fraction of 24%. Patient states this did clinic some abdominal pain and discomfort with this study. I did review the HIDA scan personally.  -------------------------------------  Referred by: Dr. Seabron SpatesYvonne Lowne Chase Chief Complaint: Abdominal pain  Patient is a 48 year old female with a history of diabetes, reflux, hyperlipidemia. She comes in with several month history of right abdominal pain. States that have been previously on and off. She said recently showed to be bout of constant abdominal pain. States it's more on and off. She states the pain is usually after eating high fatty meals, or spicy meals.  Patient also gives a history of constipation. She states that she has a bowel movement once a day and possibly once every other day. She states that the stools are firm.  Patient underwent ultrasound which I reviewed personally which revealed no gallstones. Patient's LFTs within normal limits.    Allergies No Known Drug Allergies  [06/01/2018]: Allergies Reconciled   Medication History Omeprazole (40MG  Capsule DR, Oral) Active. Jardiance (25MG  Tablet, Oral) Active. Fluticasone Furoate (27.5MCG/SPRAY Suspension, Nasal) Active. Basaglar KwikPen (100UNIT/ML Soln Pen-inj, Subcutaneous) Active. Victoza (18MG /3ML Soln Pen-inj, Subcutaneous) Active. Famotidine (20MG  Tablet, Oral) Active. Ezetimibe (10MG  Tablet, Oral) Active. NIFEdipine ER Osmotic Release (60MG  Tablet ER 24HR, Oral)  Active. Cetirizine HCl (10MG  Tablet, Oral) Active. Medications Reconciled Montelukast Sodium (10MG  Tablet, Oral) Active. Pantoprazole Sodium (40MG  Tablet DR, Oral) Active.    Review of Systems  General Present- Fatigue. Not Present- Appetite Loss, Chills, Fever, Night Sweats, Weight Gain and Weight Loss. Skin Present- Dryness. Not Present- Change in Wart/Mole, Hives, Jaundice, New Lesions, Non-Healing Wounds, Rash and Ulcer. HEENT Present- Sinus Pain and Wears glasses/contact lenses. Not Present- Earache, Hearing Loss, Hoarseness, Nose Bleed, Oral Ulcers, Ringing in the Ears, Seasonal Allergies, Sore Throat, Visual Disturbances and Yellow Eyes. Respiratory Not Present- Bloody sputum, Chronic Cough, Difficulty Breathing, Snoring and Wheezing. Breast Not Present- Breast Mass, Breast Pain, Nipple Discharge and Skin Changes. Cardiovascular Present- Leg Cramps. Not Present- Chest Pain, Difficulty Breathing Lying Down, Palpitations, Rapid Heart Rate, Shortness of Breath and Swelling of Extremities. Gastrointestinal Present- Constipation, Excessive gas and Gets full quickly at meals. Not Present- Abdominal Pain, Bloating, Bloody Stool, Change in Bowel Habits, Chronic diarrhea, Difficulty Swallowing, Hemorrhoids, Indigestion, Nausea, Rectal Pain and Vomiting. Female Genitourinary Not Present- Frequency, Nocturia, Painful Urination, Pelvic Pain and Urgency. Musculoskeletal Not Present- Back Pain, Joint Pain, Joint Stiffness, Muscle Pain, Muscle Weakness and Swelling of Extremities. Neurological Not Present- Decreased Memory, Fainting, Headaches, Numbness, Seizures, Tingling, Tremor, Trouble walking and Weakness. Psychiatric Not Present- Anxiety, Bipolar, Change in Sleep Pattern, Depression, Fearful and Frequent crying. Endocrine Not Present- Cold Intolerance, Excessive Hunger, Hair Changes, Heat Intolerance, Hot flashes and New Diabetes. Hematology Not Present- Blood Thinners, Easy Bruising,  Excessive bleeding, Gland problems, HIV and Persistent Infections. All other systems negative  Vitals  12/02/2018 10:13 AM Weight: 245.38 lb Height: 69in Body Surface Area: 2.25 m Body Mass Index: 36.24 kg/m  Temp.: 97.66F  Pulse: 97 (Regular)  P.OX: 99% (Room air) BP: 130/76(Sitting, Left Arm,  Standard)       Physical Exam  The physical exam findings are as follows: Note: Constitutional: No acute distress, conversant, appears stated age  Eyes: Anicteric sclerae, moist conjunctiva, no lid lag  Neck: No thyromegaly, trachea midline, no cervical lymphadenopathy  Lungs: Clear to auscultation biilaterally, normal respiratory effot  Cardiovascular: regular rate & rhythm, no murmurs, no peripheal edema, pedal pulses 2+  GI: Soft, no masses or hepatosplenomegaly, non-tender to palpation  MSK: Normal gait, no clubbing cyanosis, edema  Skin: No rashes, palpation reveals normal skin turgor  Psychiatric: Appropriate judgment and insight, oriented to person, place, and time    Assessment & Plan   BILIARY DYSKINESIA (K82.8) Impression: Patient is a 48 year old female with a history of diabetes, hyperlipidemia, reflux, who follows back up for biliary dyskinesia. Her HIDA scan did reveal an ejection fraction of 24%. Patient continues to have similar symptoms she had in the past. 1. We will proceed to the operating room for a laparoscopic cholecystectomy  2. Risks and benefits were discussed with the patient to generally include, but not limited to: infection, bleeding, possible need for post op ERCP, damage to the bile ducts, bile leak, and possible need for further surgery. Alternatives were offered and described. All questions were answered and the patient voiced understanding of the procedure and wishes to proceed at this point with a laparoscopic cholecystectomy   CHRONIC CONSTIPATION (K59.09) Impression: as above

## 2018-12-04 ENCOUNTER — Other Ambulatory Visit: Payer: Self-pay | Admitting: *Deleted

## 2018-12-04 DIAGNOSIS — R1013 Epigastric pain: Secondary | ICD-10-CM

## 2018-12-04 MED ORDER — PANTOPRAZOLE SODIUM 40 MG PO TBEC: 40.0000 mg | DELAYED_RELEASE_TABLET | Freq: Every day | ORAL | 1 refills | Status: DC

## 2018-12-18 ENCOUNTER — Ambulatory Visit: Payer: Managed Care, Other (non HMO)

## 2018-12-18 ENCOUNTER — Other Ambulatory Visit: Payer: Self-pay

## 2018-12-18 ENCOUNTER — Ambulatory Visit (INDEPENDENT_AMBULATORY_CARE_PROVIDER_SITE_OTHER): Payer: Managed Care, Other (non HMO)

## 2018-12-18 DIAGNOSIS — Z23 Encounter for immunization: Secondary | ICD-10-CM | POA: Diagnosis not present

## 2018-12-23 ENCOUNTER — Ambulatory Visit: Payer: Managed Care, Other (non HMO)

## 2019-01-06 ENCOUNTER — Telehealth: Payer: Managed Care, Other (non HMO) | Admitting: Family

## 2019-01-06 ENCOUNTER — Other Ambulatory Visit: Payer: Self-pay

## 2019-01-06 DIAGNOSIS — J069 Acute upper respiratory infection, unspecified: Secondary | ICD-10-CM

## 2019-01-06 DIAGNOSIS — Z20822 Contact with and (suspected) exposure to covid-19: Secondary | ICD-10-CM

## 2019-01-06 NOTE — Progress Notes (Signed)
E-Visit for Corona Virus Screening   Your current symptoms could be consistent with the coronavirus.  Many health care providers can now test patients at their office but not all are.  [facility] has multiple testing sites. For information on our COVID testing locations and hours go to [email or web address]  Please quarantine yourself while awaiting your test results.  We are enrolling you in our [place] for [place] . Daily you will receive a questionnaire within the [facility] website. Our COVID 19 response team willl be monitoriing your responses daily.  If your blurred vision worsens or do not improve you need to be seen face to face!!!  You can go to one of the  testing sites listed below, while they are opened (see hours). You do not need an order and will stay in your car during the test. You do need to self isolate until your results return and if positive 14 days from when your symptoms started and until you are 3 days symptom free.   Testing Locations (Monday - Friday, 8 a.m. - 3:30 p.m.) . [place]: [facility], [address] [place], [place]: [place], [place], [place], [place] (entrance [address])  . [facility]: (Closed each Monday): Testing site relocated to the short stay covered drive at [facility]. (Use the [facility] entrance to [facility] next to [place] is a respiratory illness with symptoms that are similar to the flu. Symptoms are typically mild to moderate, but there have been cases of severe illness and death due to the virus. The following symptoms may appear 2-14 days after exposure: . Fever . Cough . Shortness of breath or difficulty breathing . Chills . Repeated shaking with chills . Muscle pain . Headache . Sore throat . New loss of taste or smell . Fatigue . Congestion or runny  nose . Nausea or vomiting . Diarrhea  It is vitally important that if you feel that you have an infection such as this virus or any other virus that you stay home and away from places where you may spread it to others.  You should self-quarantine for 14 days if you have symptoms that could potentially be coronavirus or have been in close contact a with a person diagnosed with COVID-19 within the last 2 weeks. You should avoid contact with people age 46 and older.   You should wear a mask or cloth face covering over your nose and mouth if you must be around other people or animals, including pets (even at home). Try to stay at least 6 feet away from other people. This will protect the people around you.    You may also take acetaminophen (Tylenol) as needed for fever.   Reduce your risk of any infection by using the same precautions used for avoiding the common cold or flu:  Marland Kitchen Wash your hands often with soap and warm water for at least 20 seconds.  If soap and water are not readily available, use an alcohol-based hand sanitizer with at least 60% alcohol.  . If coughing or sneezing, cover your mouth and nose by coughing or sneezing into the elbow areas of your shirt or coat, into a tissue or into your sleeve (not your hands). . Avoid shaking hands with others and consider head nods or verbal greetings only. . Avoid touching your eyes, nose, or mouth with unwashed hands.  Marland Kitchen  Avoid close contact with people who are sick. . Avoid places or events with large numbers of people in one location, like concerts or sporting events. . Carefully consider travel plans you have or are making. . If you are planning any travel outside or inside the [place], visit the CDC's Travelers' Health webpage for the latest health notices. . If you have some symptoms but not all symptoms, continue to monitor at home and seek medical attention if your symptoms worsen. . If you are having a medical emergency, call 911.  HOME  CARE . Only take medications as instructed by your medical team. . Drink plenty of fluids and get plenty of rest. . A steam or ultrasonic humidifier can help if you have congestion.   GET HELP RIGHT AWAY IF YOU HAVE EMERGENCY WARNING SIGNS** FOR COVID-19. If you or someone is showing any of these signs seek emergency medical care immediately. Call 911 or proceed to your closest emergency facility if: . You develop worsening high fever. . Trouble breathing . Bluish lips or face . Persistent pain or pressure in the chest . New confusion . Inability to wake or stay awake . You cough up blood. . Your symptoms become more severe  **This list is not all possible symptoms. Contact your medical provider for any symptoms that are sever or concerning to you.   MAKE SURE YOU   Understand these instructions.  Will watch your condition.  Will get help right away if you are not doing well or get worse.  Approximately 5 minutes was spent documenting and reviewing patient's chart.    Your e-visit answers were reviewed by a board certified advanced clinical practitioner to complete your personal care plan.  Depending on the condition, your plan could have included both over the counter or prescription medications.  If there is a problem please reply once you have received a response from your provider.  Your safety is important to [place].  If you have drug allergies check your prescription carefully.    You can use MyChart to ask questions about today's visit, request a non-urgent call back, or ask for a work or school excuse for 24 hours related to this e-Visit. If it has been greater than 24 hours you will need to follow up with your provider, or enter a new e-Visit to address those concerns. You will get an e-mail in the next two days asking about your experience.  I hope that your e-visit has been valuable and will speed your recovery. Thank you for using e-visits.

## 2019-01-08 LAB — NOVEL CORONAVIRUS, NAA: SARS-CoV-2, NAA: NOT DETECTED

## 2019-01-12 ENCOUNTER — Encounter: Payer: Self-pay | Admitting: Family Medicine

## 2019-01-12 ENCOUNTER — Encounter (INDEPENDENT_AMBULATORY_CARE_PROVIDER_SITE_OTHER): Payer: Self-pay

## 2019-01-12 ENCOUNTER — Other Ambulatory Visit: Payer: Self-pay

## 2019-01-12 ENCOUNTER — Ambulatory Visit (INDEPENDENT_AMBULATORY_CARE_PROVIDER_SITE_OTHER): Payer: Managed Care, Other (non HMO) | Admitting: Family Medicine

## 2019-01-12 VITALS — BP 120/86 | HR 71 | Temp 97.2°F | Ht 68.5 in

## 2019-01-12 DIAGNOSIS — T7840XA Allergy, unspecified, initial encounter: Secondary | ICD-10-CM

## 2019-01-12 DIAGNOSIS — I10 Essential (primary) hypertension: Secondary | ICD-10-CM | POA: Diagnosis not present

## 2019-01-12 DIAGNOSIS — R059 Cough, unspecified: Secondary | ICD-10-CM

## 2019-01-12 DIAGNOSIS — R6889 Other general symptoms and signs: Secondary | ICD-10-CM

## 2019-01-12 DIAGNOSIS — R05 Cough: Secondary | ICD-10-CM

## 2019-01-12 DIAGNOSIS — J0140 Acute pansinusitis, unspecified: Secondary | ICD-10-CM | POA: Diagnosis not present

## 2019-01-12 MED ORDER — AMOXICILLIN-POT CLAVULANATE 875-125 MG PO TABS: 1.0000 | ORAL_TABLET | Freq: Two times a day (BID) | ORAL | 0 refills | Status: DC

## 2019-01-12 MED ORDER — NIFEDIPINE ER OSMOTIC RELEASE 60 MG PO TB24: 60.0000 mg | ORAL_TABLET | Freq: Every day | ORAL | 1 refills | Status: DC

## 2019-01-12 MED ORDER — AZELASTINE HCL 0.05 % OP SOLN: 1.0000 [drp] | Freq: Two times a day (BID) | OPHTHALMIC | 12 refills | Status: DC

## 2019-01-12 NOTE — Progress Notes (Signed)
Virtual Visit via Video Note  I connected with Kathleen Jordan on 01/12/19 at 10:20 AM EDT by a video enabled telemedicine application and verified that I am speaking with the correct person using two identifiers.  Location: Patient: home  Provider: office   I discussed the limitations of evaluation and management by telemedicine and the availability of in person appointments. The patient expressed understanding and agreed to proceed.  History of Present Illness: Pt is home c/o sinus pressure/ congestion  No fever,  Some cough --dry  No loss of taste or smell.   +body aches  Symptoms off and on x 2 weeks mucinex is not helping     Observations/Objective: Vitals:   01/12/19 1007  BP: 120/86  Pulse: 71  Temp: (!) 97.2 F (36.2 C)  SpO2: 96%   Pt is in NAD  Assessment and Plan: 1. Essential hypertension Need refills on meds  - NIFEdipine (PROCARDIA XL/NIFEDICAL XL) 60 MG 24 hr tablet; Take 1 tablet (60 mg total) by mouth daily.  Dispense: 90 tablet; Refill: 1  2. Acute non-recurrent pansinusitis abx per orders con't nasal sprays per orders Call prn  - amoxicillin-clavulanate (AUGMENTIN) 875-125 MG tablet; Take 1 tablet by mouth 2 (two) times daily.  Dispense: 20 tablet; Refill: 0  3. Itchy eyes gtts per orders Can also use otc lubricating drops ie--  Systane, refresh - azelastine (OPTIVAR) 0.05 % ophthalmic solution; Place 1 drop into both eyes 2 (two) times daily.  Dispense: 6 mL; Refill: 12   Follow Up Instructions:    I discussed the assessment and treatment plan with the patient. The patient was provided an opportunity to ask questions and all were answered. The patient agreed with the plan and demonstrated an understanding of the instructions.   The patient was advised to call back or seek an in-person evaluation if the symptoms worsen or if the condition fails to improve as anticipated.  I provided 15 minutes of non-face-to-face time during this  encounter.   Ann Held, DO

## 2019-01-13 ENCOUNTER — Ambulatory Visit (INDEPENDENT_AMBULATORY_CARE_PROVIDER_SITE_OTHER)
Admission: RE | Admit: 2019-01-13 | Discharge: 2019-01-13 | Disposition: A | Discharge status: Home / Self Care | Payer: Managed Care, Other (non HMO) | Source: Ambulatory Visit | Attending: Family Medicine | Admitting: Family Medicine

## 2019-01-13 ENCOUNTER — Other Ambulatory Visit: Payer: Self-pay

## 2019-01-13 DIAGNOSIS — R05 Cough: Secondary | ICD-10-CM | POA: Diagnosis not present

## 2019-01-13 DIAGNOSIS — R059 Cough, unspecified: Secondary | ICD-10-CM

## 2019-01-18 ENCOUNTER — Encounter (INDEPENDENT_AMBULATORY_CARE_PROVIDER_SITE_OTHER): Payer: Self-pay

## 2019-01-19 ENCOUNTER — Encounter (INDEPENDENT_AMBULATORY_CARE_PROVIDER_SITE_OTHER): Payer: Self-pay

## 2019-01-21 NOTE — Progress Notes (Signed)
New Patient Note  RE: Kathleen Jordan MRN: [id number] DOB: 1970-08-17 Date of Office Visit: 01/22/2019  Referring provider: [name], * Primary care provider: Zola Button, Grayling Congress, DO  Chief Complaint: Food Intolerance  History of Present Illness: I had the pleasure of seeing Kathleen Jordan for initial evaluation at the [facility] on 01/22/2019. She is a 48 y.o. female, who is referred here by [name], DO for the evaluation of allergic rhinitis and food allergies.   Rhinitis: She reports symptoms of watery eyes, itchy nose, rhinorrhea, itchy skin, nasal congestion, sneezing. Symptoms have been going on for 5+ years. The symptoms are present all year around. Other triggers include exposure to dust. Anosmia: no. Headache: sometimes. She has used zyrtec, Claritin, Xyzal, Singulair, Flonase, Astelin, Optivar with minimal improvement in symptoms. Sinus infections: 4 per year. Previous work up includes: skin testing 7 years ago was positive to dust mites per patient report. No previous allergy injections Previous ENT evaluation: no. Previous sinus imaging: no. Last eye exam: last year. History of nasal polyps: no.  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 This has been going on for about 1 year now.  She noticed perioral pruritus, skin pruritus and even the vaginal area seems to itch. Usually symptoms occur within 1 hour of ingestion.   She has not tried processed forms of the above foods.  The symptoms usually last for 1 day after taking antihistamines. She was never evaluated in ED 40s reactions.  Patient had grapes yesterday and now feeling itching.   Dairy products and eggs cause some GI issues.  Beef causes increased gas and bloating.  Leafy vegetables cause diarrhea.  Seafood and shellfish causes some swelling of the feet. Sometimes the shellfish of the shrimp  causes some discomfort in her hands.   She does not have access to epinephrine autoinjector.  Past work up includes: none.  Dietary History: patient has been eating other foods including very limited milk, limited eggs, limited peanut, sesame, shellfish, seafood, soy, wheat, meats, some fruits and vegetables.   Other medical history includes diabetes, hypertension, hyperlipidemia and reflux.  Assessment and Plan: Kathleen Jordan is a 48 y.o. female with: Other allergic rhinitis Perennial rhinoconjunctivitis symptoms for the past 5 years.  Triggers include dust.  Tried over-the-counter antihistamines, Singulair, Flonase, Astelin and eyedrops with minimal benefit.  Skin testing over 10 years ago was positive to dust mites per patient report.  No previous allergy immunotherapy.  No previous ENT evaluation.  Today skin testing showed: Positive to dust mites and cockroaches.   Start environmental control measures.  May use over the counter antihistamines such as Zyrtec (cetirizine), Claritin (loratadine), Allegra (fexofenadine), or Xyzal (levocetirizine) daily as needed.  May take twice a day if needed.   May use Flonase 1-2 sprays daily for nasal congestion.  May use Astelin 1-2 sprays per nostril twice a day as needed for runny nose.  May use Optivar eye drops as needed for itchy/watery eyes.   Had a detailed discussion with patient/family that clinical history is suggestive of allergic rhinitis, and may benefit from allergy immunotherapy (AIT). Discussed in detail regarding the dosing, schedule, side effects (mild to moderate local allergic reaction and rarely systemic allergic reactions including anaphylaxis), and benefits (significant improvement in nasal symptoms, seasonal flares of asthma) of immunotherapy with the patient. There is significant time commitment involved with allergy shots, which includes weekly immunotherapy injections  for first 9-12 months and then biweekly to monthly  injections for 3-5 years.   Allergic conjunctivitis of both eyes  See assessment and plan as above for allergic rhinitis.  Adverse food reaction Noticed perioral pruritus and skin pruritus for the past year after consuming fresh grapes, strawberries, mangoes, kiwi, pineapples, peanuts and tree nuts.  Symptoms usually resolve after a day after antihistamines.  Dairy products, eggs, beef, leafy vegetables cause some GI discomfort.  Seafood and shellfish sometimes causes swelling of the feet and even contact with the shrimp can cause discomfort in her hands. Today skin testing was negative to foods. Continue to avoid foods that bother you - grapes, strawberries, mangoes, kiwi, pineapples, peanut, tree nuts, shellfish, seafood.  The beef, eggs, and leafy vegetables causing GI issues is more likely a non-IgE mediated reaction.  Food allergen skin testing has excellent negative predictive value however there is still a 5% chance that the allergy exists. Therefore, we will investigate further with serum specific IgE levels and, if negative then schedule for open graded oral food challenge. A laboratory order form has been provided for serum specific IgE against some of the above foods.  I have prescribed epinephrine injectable and demonstrated proper use. For mild symptoms you can take over the counter antihistamines such as Benadryl and monitor symptoms closely. If symptoms worsen or if you have severe symptoms including breathing issues, throat closure, significant swelling, whole body hives, severe diarrhea and vomiting, lightheadedness then inject epinephrine and seek immediate medical care afterwards.  Food action plan given.  You may have some component of lactose intolerance as well.  Try Lactaid products.  Frequent sinus infections History of frequent sinus infections.  No previous immune evaluation.  Discussed with patient this may be due to her underlying environmental allergies and her  diabetes does increase risk for infections.  Keep track of infections.  Will check for some basic immune bloodwork.   Shortness of breath Some SOB at times. No triggers noted. No history of asthma/COPD.  Today's spirometry showed mild restriction.  Keep track of symptoms.  Return in about 2 months (around 03/24/2019).  Meds ordered this encounter  Medications  . EPINEPHrine (AUVI-Q) 0.3 mg/0.3 mL IJ SOAJ injection    Sig: Inject 0.3 mLs (0.3 mg total) into the muscle as needed for anaphylaxis.    Dispense:  1 each    Refill:  1    Lab Orders     CBC with Differential/Platelet     Strep pneumoniae 23 Serotypes IgG     Diphtheria / Tetanus Antibody Panel     IgG, IgA, IgM     Tryptase     Food Allergy Profile     Alpha-Gal Panel     Allergen, Mango, f91     Allergen, Strawberry, f44     Allergens(7)     Allergen Grape f259  Other allergy screening: Asthma: no  Some shortness of breath at times.  No triggers noted. Never had an inhaler before.  Rhino conjunctivitis: yes Food allergy: yes Medication allergy: no Hymenoptera allergy: no Urticaria: no Eczema: yes  Diagnostics: Spirometry:  Tracings reviewed. Her effort: Good reproducible efforts. FVC: 2.47L FEV1: 2.12L, 76% predicted FEV1/FVC ratio: 86% Interpretation: Spirometry consistent with possible restrictive disease.  Please see scanned spirometry results for details.  Skin Testing: Environmental allergy panel and select foods. Positive test to: dust mites and cockroach. Negative test to: select foods.  Results discussed with patient/family. Airborne Adult Perc - 01/22/19 [phone]  Time Antigen Placed  1610    Allergen Manufacturer  Greer    Location  Back    Number of Test  59    Panel 1  Select    1. Control-Buffer 50% Glycerol  Negative    2. Control-Histamine 1 mg/ml  2+    3. Albumin saline  Negative    4. Bahia  Negative    5. French Southern Territories  Negative    6. Johnson  Negative    7. Kentucky Blue   Negative    8. Meadow Fescue  Negative    9. Perennial Rye  Negative    10. Sweet Vernal  Negative    11. Timothy  Negative    12. Cocklebur  Negative    13. Burweed Marshelder  Negative    14. Ragweed, short  Negative    15. Ragweed, Giant  Negative    16. Plantain,  English  Negative    17. Lamb's Quarters  Negative    18. Sheep Sorrell  Negative    19. Rough Pigweed  Negative    20. Marsh Elder, Rough  Negative    21. Mugwort, Common  Negative    22. Ash mix  Negative    23. Birch mix  Negative    24. Beech American  Negative    25. Box, Elder  Negative    26. Cedar, red  Negative    27. Cottonwood, Guinea-Bissau  Negative    28. Elm mix  Negative    29. Hickory mix  Negative    30. Maple mix  Negative    31. Oak, Guinea-Bissau mix  Negative    32. Pecan Pollen  Negative    33. Pine mix  Negative    34. Sycamore Eastern  Negative    35. Walnut, Black Pollen  Negative    36. Alternaria alternata  Negative    37. Cladosporium Herbarum  Negative    38. Aspergillus mix  Negative    39. Penicillium mix  Negative    40. Bipolaris sorokiniana (Helminthosporium)  Negative    41. Drechslera spicifera (Curvularia)  Negative    42. Mucor plumbeus  Negative    43. Fusarium moniliforme  Negative    44. Aureobasidium pullulans (pullulara)  Negative    45. Rhizopus oryzae  Negative    46. Botrytis cinera  Negative    47. Epicoccum nigrum  Negative    48. Phoma betae  Negative    49. Candida Albicans  Negative    50. Trichophyton mentagrophytes  Negative    51. Mite, D Farinae  5,000 AU/ml  2+    52. Mite, D Pteronyssinus  5,000 AU/ml  2+    53. Cat Hair 10,000 BAU/ml  Negative    54.  Dog Epithelia  Negative    55. Mixed Feathers  Negative    56. Horse Epithelia  Negative    57. Cockroach, German  Negative    58. Mouse  Negative    59. Tobacco Leaf  Negative     Intradermal - 01/22/19 0943    Time Antigen Placed  0945    Allergen Manufacturer  Waynette Buttery    Location  Arm    Number of  Test  14    Control  Negative    French Southern Territories  Negative    Johnson  Negative    7 Grass  Negative    Ragweed mix  Negative    Weed mix  Negative  Tree mix  Negative    Mold 1  Negative    Mold 2  Negative    Mold 3  Negative    Mold 4  Negative    Cat  Negative    Dog  Negative    Cockroach  2+     Food Adult Perc - 01/22/19 0900    Time Antigen Placed  3474    Allergen Manufacturer  Lavella Hammock    Location  Back    Number of allergen test  28     Control-buffer 50% Glycerol  Negative    Control-Histamine 1 mg/ml  2+    1. Peanut  Negative    2. Soybean  Negative    3. Wheat  Negative    4. Sesame  Negative    5. Milk, cow  Negative    6. Egg White, Chicken  Negative    7. Casein  Negative    8. Shellfish Mix  Negative    9. Fish Mix  Negative    10. Cashew  Negative    11. Pecan Food  Negative    12. [place]  Negative    13. Almond  Negative    14. Hazelnut  Negative    15. Bol[place] nut  Negative    16. Coconut  Negative    17. Pistachio  Negative    25. Shrimp  Negative    26. Crab  Negative    40. Beef  Negative    42. Tomato  Negative    45. Pea, Green/English  Negative    46. Navy Bean  Negative    50. Cabbage  Negative    55. Grape (White seedless)  Negative    59. Peach  Negative    60. Strawberry  Negative    63. Pineapple  Negative       Past Medical History: Patient Active Problem List   Diagnosis Date Noted  . Other allergic rhinitis 01/22/2019  . Adverse food reaction 01/22/2019  . Shortness of breath 01/22/2019  . Allergic conjunctivitis of both eyes 01/22/2019  . Hyperlipidemia associated with type 2 diabetes mellitus ([place]) 06/13/2018  . Menorrhagia 08/13/2017  . Simple endometrial hyperplasia without atypia 08/13/2017  . Gastroesophageal reflux disease 07/24/2017  . Rectal bleeding 04/02/2017  . Anal fissure 04/02/2017  . Constipation 04/02/2017  . Vaginal itching 01/31/2017  . Preventative health care 06/20/2016  . IBS (irritable bowel  syndrome) 09/07/2015  . Knee pain, acute 06/01/2014  . Obesity (BMI 30-39.9) 06/15/2013  . Postpartum care following vaginal delivery (8/15) 11/13/2012  . Frequent sinus infections 07/02/2011  . OSA (obstructive sleep apnea) 12/06/2010  . IDDM (insulin dependent diabetes mellitus) ([place]) 11/23/2010  . HEADACHE 12/08/2009  . POLYCYSTIC OVARIES 08/02/2009  . MORBID OBESITY 06/29/2009  . GOITER, SIMPLE 01/10/2009  . DYSPHAGIA UNSPECIFIED 01/10/2009  . ABSCESS, SKIN 11/10/2007  . Hypertension 05/21/2007  . SINUSITIS- ACUTE-NOS 05/21/2007  . Essential hypertension 09/18/2006   Past Medical History:  Diagnosis Date  . Allergic rhinitis   . Anal fissure 07/15/2017  . Chronic constipation   . Frequency of urination   . GERD (gastroesophageal reflux disease)   . History of palpitations   . Hx of varicella   . Hyperlipidemia   . Hypertension   . Menorrhagia   . OSA (obstructive sleep apnea)    08-07-2017 no cpap due to finanaces  . PCOS (polycystic ovarian syndrome)   . Peripheral neuropathy   . Thyroid goiter   .  Type 2 diabetes mellitus with hyperglycemia, with long-term current use of insulin [facility])    endocrinologist-  dr balan--  08-07-2017 per pt AM cbg's run 150-280  . Urgency of urination    Past Surgical History: Past Surgical History:  Procedure Laterality Date  . BREAST REDUCTION SURGERY Bilateral 07-18-2003   dr Shon Hough  [facility]  . COLONOSCOPY WITH PROPOFOL N/A 07/15/2017   Procedure: COLONOSCOPY WITH PROPOFOL;  Surgeon: Hilarie Fredrickson, MD;  Location: [facility];  Service: Endoscopy;  Laterality: N/A;  . DILITATION & CURRETTAGE/HYSTROSCOPY WITH HYDROTHERMAL ABLATION N/A 03/06/2018   Procedure: DILATATION & CURETTAGE/HYSTEROSCOPY WITH HYDROTHERMAL ABLATION, INSERTION OF INTRAUTERINE DEVICE;  Surgeon: Hal Morales, MD;  Location: [facility];  Service: Gynecology;  Laterality: N/A;  . invitro fertilization  11/16/2010   [facility]   Medication List:   Current Outpatient Medications  Medication Sig Dispense Refill  . azelastine (ASTELIN) 0.1 % nasal spray Place 2 sprays into both nostrils 2 (two) times daily. Use in each nostril as directed 30 mL 2  . azelastine (OPTIVAR) 0.05 % ophthalmic solution Place 1 drop into both eyes 2 (two) times daily. 6 mL 12  . empagliflozin (JARDIANCE) 10 MG TABS tablet Take 10 mg by mouth every evening.    . ezetimibe (ZETIA) 10 MG tablet TAKE 1 TABLET(10 MG) BY MOUTH DAILY 90 tablet 1  . Insulin Glargine (BASAGLAR KWIKPEN) 100 UNIT/ML SOPN Inject 65 Units into the skin 2 (two) times daily.     Marland Kitchen levocetirizine (XYZAL) 5 MG tablet Take 1 tablet (5 mg total) by mouth every evening. 90 tablet 3  . liraglutide (VICTOZA) 18 MG/3ML SOPN Inject 1.2 mg into the skin every morning.    Marland Kitchen NIFEdipine (PROCARDIA XL/NIFEDICAL XL) 60 MG 24 hr tablet Take 1 tablet (60 mg total) by mouth daily. 90 tablet 1  . EPINEPHrine (AUVI-Q) 0.3 mg/0.3 mL IJ SOAJ injection Inject 0.3 mLs (0.3 mg total) into the muscle as needed for anaphylaxis. 1 each 1  . famotidine (PEPCID) 20 MG tablet Take 1 tablet (20 mg total) by mouth 2 (two) times daily. (Patient not taking: Reported on 01/22/2019) 60 tablet 5  . fluticasone (FLONASE) 50 MCG/ACT nasal spray Place 2 sprays into both nostrils 2 (two) times daily as needed.     . montelukast (SINGULAIR) 10 MG tablet Take 1 tablet (10 mg total) by mouth at bedtime. (Patient not taking: Reported on 01/22/2019) 90 tablet 3  . pantoprazole (PROTONIX) 40 MG tablet Take 1 tablet (40 mg total) by mouth daily. (Patient not taking: Reported on 01/22/2019) 90 tablet 1  . rosuvastatin (CRESTOR) 10 MG tablet Take 1 tablet (10 mg total) by mouth at bedtime. (Patient not taking: Reported on 01/22/2019) 30 tablet 2   No current facility-administered medications for this visit.    Allergies: No Known Allergies Social History: Social History   Socioeconomic History  . Marital status: Married    Spouse name:  Caryn Bee  . Number of children: 1  . Years of education: Masters  . Highest education level: Not on file  Occupational History  . 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 [place]  Social Needs  . Financial resource strain: Not on file  . Food insecurity    Worry: Not on file    Inability: Not on file  . Transportation needs    Medical: Not on file    Non-medical: Not on file  Tobacco Use  . Smoking status: Passive Smoke Exposure - Never Smoker  . Smokeless tobacco: Never Used  Substance and Sexual Activity  . Alcohol use: No  . Drug use: No  . Sexual activity: Yes    Partners: Male    Birth control/protection: None  Lifestyle  . Physical activity    Days per week: Not on file    Minutes per session: Not on file  . Stress: Not on file  Relationships  . Social Musician on phone: Not on file    Gets together: Not on file    Attends religious service: Not on file    Active member of club or organization: Not on file    Attends meetings of clubs or organizations: Not on file    Relationship status: Not on file  Other Topics Concern  . Not on file  Social History Narrative   Drinks caffeine 2x a week    Lives in a 46 year old home. Smoking: denies Occupation: Environmental health practitioner HistorySurveyor, minerals in the house: yes window in the bathroom Carpet in the family room: yes Carpet in the bedroom: yes Heating: gas Cooling: central Pet: no  Family History: Family History  Problem Relation Age of Onset  . Arthritis Mother   . Diabetes Mother   . Hypertension Mother   . Heart disease Mother 82       MI  . Stroke Mother   . Congestive Heart Failure Mother   . Allergic rhinitis Mother   . Asthma Mother   . Allergic rhinitis Sister   . Diabetes Brother   . Hypertension Brother   . Arthritis Maternal Grandmother   . Diabetes Maternal Grandmother   . Hypertension Maternal  Grandmother   . Cancer Maternal Grandmother   . Stroke Maternal Grandfather   . Cancer Maternal Grandfather   . Asthma Sister   . Allergies Sister    Review of Systems  Constitutional: Negative for appetite change, chills, fever and unexpected weight change.  HENT: Positive for congestion, ear pain and rhinorrhea.   Eyes: Positive for itching.  Respiratory: Negative for cough, chest tightness, shortness of breath and wheezing.   Cardiovascular: Negative for chest pain.  Gastrointestinal: Negative for abdominal pain.  Genitourinary: Negative for difficulty urinating.  Skin: Negative for rash.  Allergic/Immunologic: Positive for environmental allergies.  Neurological: Negative for headaches.   Objective: BP 130/84   Pulse 82   Temp 98 F (36.7 C) (Oral)   Resp 16   Ht 5' 8.2" (1.732 m)   Wt 244 lb 9.6 oz (110.9 kg)   SpO2 97%   BMI 36.97 kg/m  Body mass index is 36.97 kg/m. Physical Exam  Constitutional: She is oriented to person, place, and time. She appears well-developed and well-nourished.  HENT:  Head: Normocephalic and atraumatic.  Right Ear: External ear normal.  Left Ear: External ear normal.  Nose: Nose normal.  Mouth/Throat: Oropharynx is clear and moist.  Eyes: Conjunctivae and EOM are normal.  Neck: Neck supple.  Cardiovascular: Normal rate, regular rhythm and normal heart sounds. Exam reveals no gallop and no friction rub.  No murmur heard. Pulmonary/Chest: Effort normal and breath sounds normal. She has no wheezes. She has no rales.  Abdominal: Soft.  Neurological: She is alert and oriented to person, place, and time.  Skin: Skin is warm. No rash noted.  Psychiatric: She has a normal mood and affect. Her behavior is normal.  Nursing note and vitals  reviewed.  The plan was reviewed with the patient/family, and all questions/concerned were addressed.  It was my pleasure to see Kathleen Jordan today and participate in her care. Please feel free to contact me with  any questions or concerns.  Sincerely,  Wyline Mood, DO Allergy & Immunology  [facility] office: [phone] [facility] office: [phone] [place] office: [phone]

## 2019-01-22 ENCOUNTER — Other Ambulatory Visit: Payer: Self-pay

## 2019-01-22 ENCOUNTER — Ambulatory Visit (INDEPENDENT_AMBULATORY_CARE_PROVIDER_SITE_OTHER): Payer: Managed Care, Other (non HMO) | Admitting: Allergy

## 2019-01-22 ENCOUNTER — Encounter: Payer: Self-pay | Admitting: Allergy

## 2019-01-22 VITALS — BP 130/84 | HR 82 | Temp 98.0°F | Resp 16 | Ht 68.2 in | Wt 244.6 lb

## 2019-01-22 DIAGNOSIS — R0602 Shortness of breath: Secondary | ICD-10-CM

## 2019-01-22 DIAGNOSIS — T781XXD Other adverse food reactions, not elsewhere classified, subsequent encounter: Secondary | ICD-10-CM | POA: Diagnosis not present

## 2019-01-22 DIAGNOSIS — J329 Chronic sinusitis, unspecified: Secondary | ICD-10-CM | POA: Diagnosis not present

## 2019-01-22 DIAGNOSIS — J3089 Other allergic rhinitis: Secondary | ICD-10-CM | POA: Diagnosis not present

## 2019-01-22 DIAGNOSIS — T781XXA Other adverse food reactions, not elsewhere classified, initial encounter: Secondary | ICD-10-CM | POA: Insufficient documentation

## 2019-01-22 DIAGNOSIS — H1013 Acute atopic conjunctivitis, bilateral: Secondary | ICD-10-CM

## 2019-01-22 MED ORDER — EPINEPHRINE 0.3 MG/0.3ML IJ SOAJ: 0.3000 mg | INTRAMUSCULAR | 1 refills | Status: DC | PRN

## 2019-01-22 NOTE — Assessment & Plan Note (Signed)
   See assessment and plan as above for allergic rhinitis.  

## 2019-01-22 NOTE — Assessment & Plan Note (Signed)
Perennial rhinoconjunctivitis symptoms for the past 5 years.  Triggers include dust.  Tried over-the-counter antihistamines, Singulair, Flonase, Astelin and eyedrops with minimal benefit.  Skin testing over 10 years ago was positive to dust mites per patient report.  No previous allergy immunotherapy.  No previous ENT evaluation.  Today skin testing showed: Positive to dust mites and cockroaches.   Start environmental control measures.  May use over the counter antihistamines such as Zyrtec (cetirizine), Claritin (loratadine), Allegra (fexofenadine), or Xyzal (levocetirizine) daily as needed.  May take twice a day if needed.   May use Flonase 1-2 sprays daily for nasal congestion.  May use Astelin 1-2 sprays per nostril twice a day as needed for runny nose.  May use Optivar eye drops as needed for itchy/watery eyes.   Had a detailed discussion with patient/family that clinical history is suggestive of allergic rhinitis, and may benefit from allergy immunotherapy (AIT). Discussed in detail regarding the dosing, schedule, side effects (mild to moderate local allergic reaction and rarely systemic allergic reactions including anaphylaxis), and benefits (significant improvement in nasal symptoms, seasonal flares of asthma) of immunotherapy with the patient. There is significant time commitment involved with allergy shots, which includes weekly immunotherapy injections for first 9-12 months and then biweekly to monthly injections for 3-5 years.

## 2019-01-22 NOTE — Patient Instructions (Addendum)
Today skin testing showed: Positive to dust mites and cockroaches.   Environmental allergies:  Start environmental control measures.  May use over the counter antihistamines such as Zyrtec (cetirizine), Claritin (loratadine), Allegra (fexofenadine), or Xyzal (levocetirizine) daily as needed.  May take twice a day if needed.   May use Flonase 1-2 sprays daily for nasal congestion.  May use Astelin 1-2 sprays per nostril twice a day as needed for runny nose.  May use Optivar eye drops as needed for itchy/watery eyes.   Had a detailed discussion with patient/family that clinical history is suggestive of allergic rhinitis, and may benefit from allergy immunotherapy (AIT). Discussed in detail regarding the dosing, schedule, side effects (mild to moderate local allergic reaction and rarely systemic allergic reactions including anaphylaxis), and benefits (significant improvement in nasal symptoms, seasonal flares of asthma) of immunotherapy with the patient. There is significant time commitment involved with allergy shots, which includes weekly immunotherapy injections for first 9-12 months and then biweekly to monthly injections for 3-5 years.   Foods: Continue to avoid foods that bother your - grapes, strawberries, mangoes, kiwi, pineapples, peanut, tree nuts, shellfish, seafood.  The beef, eggs, leafy vegetables may be due to your digestion with diabetes rather than an allergy.  Food allergen skin testing has excellent negative predictive value however there is still a 5% chance that the allergy exists. Therefore, we will investigate further with serum specific IgE levels and, if negative then schedule for open graded oral food challenge. A laboratory order form has been provided for serum specific IgE against some of the above foods.  I have prescribed epinephrine injectable and demonstrated proper use. For mild symptoms you can take over the counter antihistamines such as Benadryl and monitor  symptoms closely. If symptoms worsen or if you have severe symptoms including breathing issues, throat closure, significant swelling, whole body hives, severe diarrhea and vomiting, lightheadedness then inject epinephrine and seek immediate medical care afterwards.  Food action plan given.  You may have some component of lactose intolerance as well.  Try Lactaid products.  Infections:  Keep track of infections.  Sometimes if your diabetes is not under good control, you are more likely to develop infections.  Will check for some basic immune bloodwork.   Shortness of breath:  Your breathing test looked okay today.  Keep track of symptoms.  Itchy skin:  See below for proper skin care.  Follow up in 2 months or sooner if needed.   Control of House Dust Mite Allergen . Dust mite allergens are a common trigger of allergy and asthma symptoms. While they can be found throughout the house, these microscopic creatures thrive in warm, humid environments such as bedding, upholstered furniture and carpeting. . Because so much time is spent in the bedroom, it is essential to reduce mite levels there.  . Encase pillows, mattresses, and box springs in special allergen-proof fabric covers or airtight, zippered plastic covers.  . Bedding should be washed weekly in hot water (130 F) and dried in a hot dryer. Allergen-proof covers are available for comforters and pillows that can't be regularly washed.  Wendee Copp the allergy-proof covers every few months. Minimize clutter in the bedroom. Keep pets out of the bedroom.  Marland Kitchen Keep humidity less than 50% by using a dehumidifier or air conditioning. You can buy a humidity measuring device called a hygrometer to monitor this.  . If possible, replace carpets with hardwood, linoleum, or washable area rugs. If that's not possible, vacuum frequently with  a vacuum that has a HEPA filter. . Remove all upholstered furniture and non-washable window drapes from the  bedroom. . Remove all non-washable stuffed toys from the bedroom.  Wash stuffed toys weekly.  Cockroach Allergen Avoidance Cockroaches are often found in the homes of densely populated urban areas, schools or commercial buildings, but these creatures can lurk almost anywhere. This does not mean that you have a dirty house or living area. . Block all areas where roaches can enter the home. This includes crevices, wall cracks and windows.  . Cockroaches need water to survive, so fix and seal all leaky faucets and pipes. Have an exterminator go through the house when your family and pets are gone to eliminate any remaining roaches. Marland Kitchen Keep food in lidded containers and put pet food dishes away after your pets are done eating. Vacuum and sweep the floor after meals, and take out garbage and recyclables. Use lidded garbage containers in the kitchen. Wash dishes immediately after use and clean under stoves, refrigerators or toasters where crumbs can accumulate. Wipe off the stove and other kitchen surfaces and cupboards regularly.   Skin care recommendations  Bath time: . Always use lukewarm water. AVOID very hot or cold water. Marland Kitchen Keep bathing time to 5-10 minutes. . Do NOT use bubble bath. . Use a mild soap and use just enough to wash the dirty areas. . Do NOT scrub skin vigorously.  . After bathing, pat dry your skin with a towel. Do NOT rub or scrub the skin.  Moisturizers and prescriptions:  . ALWAYS apply moisturizers immediately after bathing (within 3 minutes). This helps to lock-in moisture. . Use the moisturizer several times a day over the whole body. Peri Jefferson summer moisturizers include: Aveeno, CeraVe, Cetaphil. Peri Jefferson winter moisturizers include: Aquaphor, Vaseline, Cerave, Cetaphil, Eucerin, Vanicream. . When using moisturizers along with medications, the moisturizer should be applied about one hour after applying the medication to prevent diluting effect of the medication or moisturize  around where you applied the medications. When not using medications, the moisturizer can be continued twice daily as maintenance.  Laundry and clothing: . Avoid laundry products with added color or perfumes. . Use unscented hypo-allergenic laundry products such as Tide free, Cheer free & gentle, and All free and clear.  . If the skin still seems dry or sensitive, you can try double-rinsing the clothes. . Avoid tight or scratchy clothing such as wool. . Do not use fabric softeners or dyer sheets.

## 2019-01-22 NOTE — Assessment & Plan Note (Signed)
Some SOB at times. No triggers noted. No history of asthma/COPD.  Today's spirometry showed mild restriction.  Keep track of symptoms.

## 2019-01-22 NOTE — Assessment & Plan Note (Signed)
History of frequent sinus infections.  No previous immune evaluation.  Discussed with patient this may be due to her underlying environmental allergies and her diabetes does increase risk for infections.  Keep track of infections.  Will check for some basic immune bloodwork.

## 2019-01-22 NOTE — Assessment & Plan Note (Addendum)
Noticed perioral pruritus and skin pruritus for the past year after consuming fresh grapes, strawberries, mangoes, kiwi, pineapples, peanuts and tree nuts.  Symptoms usually resolve after a day after antihistamines.  Dairy products, eggs, beef, leafy vegetables cause some GI discomfort.  Seafood and shellfish sometimes causes swelling of the feet and even contact with the shrimp can cause discomfort in her hands. Today skin testing was negative to foods. Continue to avoid foods that bother you - grapes, strawberries, mangoes, kiwi, pineapples, peanut, tree nuts, shellfish, seafood.  The beef, eggs, and leafy vegetables causing GI issues is more likely a non-IgE mediated reaction.  Food allergen skin testing has excellent negative predictive value however there is still a 5% chance that the allergy exists. Therefore, we will investigate further with serum specific IgE levels and, if negative then schedule for open graded oral food challenge. A laboratory order form has been provided for serum specific IgE against some of the above foods.  I have prescribed epinephrine injectable and demonstrated proper use. For mild symptoms you can take over the counter antihistamines such as Benadryl and monitor symptoms closely. If symptoms worsen or if you have severe symptoms including breathing issues, throat closure, significant swelling, whole body hives, severe diarrhea and vomiting, lightheadedness then inject epinephrine and seek immediate medical care afterwards.  Food action plan given.  You may have some component of lactose intolerance as well.  Try Lactaid products.

## 2019-02-10 ENCOUNTER — Encounter (HOSPITAL_COMMUNITY)
Admission: RE | Admit: 2019-02-10 | Discharge: 2019-02-10 | Disposition: A | Discharge status: Home / Self Care | Payer: Managed Care, Other (non HMO) | Source: Ambulatory Visit | Attending: General Surgery | Admitting: General Surgery

## 2019-02-10 ENCOUNTER — Other Ambulatory Visit: Payer: Self-pay

## 2019-02-10 ENCOUNTER — Encounter (HOSPITAL_COMMUNITY): Payer: Self-pay

## 2019-02-10 DIAGNOSIS — K219 Gastro-esophageal reflux disease without esophagitis: Secondary | ICD-10-CM | POA: Insufficient documentation

## 2019-02-10 DIAGNOSIS — E1142 Type 2 diabetes mellitus with diabetic polyneuropathy: Secondary | ICD-10-CM | POA: Diagnosis not present

## 2019-02-10 DIAGNOSIS — G4733 Obstructive sleep apnea (adult) (pediatric): Secondary | ICD-10-CM | POA: Diagnosis not present

## 2019-02-10 DIAGNOSIS — K828 Other specified diseases of gallbladder: Secondary | ICD-10-CM | POA: Diagnosis not present

## 2019-02-10 DIAGNOSIS — I10 Essential (primary) hypertension: Secondary | ICD-10-CM | POA: Diagnosis not present

## 2019-02-10 DIAGNOSIS — Z01818 Encounter for other preprocedural examination: Secondary | ICD-10-CM | POA: Insufficient documentation

## 2019-02-10 DIAGNOSIS — E282 Polycystic ovarian syndrome: Secondary | ICD-10-CM | POA: Insufficient documentation

## 2019-02-10 DIAGNOSIS — Z794 Long term (current) use of insulin: Secondary | ICD-10-CM | POA: Diagnosis not present

## 2019-02-10 DIAGNOSIS — Z975 Presence of (intrauterine) contraceptive device: Secondary | ICD-10-CM | POA: Insufficient documentation

## 2019-02-10 DIAGNOSIS — Z79899 Other long term (current) drug therapy: Secondary | ICD-10-CM | POA: Diagnosis not present

## 2019-02-10 DIAGNOSIS — E785 Hyperlipidemia, unspecified: Secondary | ICD-10-CM | POA: Diagnosis not present

## 2019-02-10 LAB — BASIC METABOLIC PANEL
Anion gap: 11 (ref 5–15)
BUN: 13 mg/dL (ref 6–20)
CO2: 21 mmol/L — ABNORMAL LOW (ref 22–32)
Calcium: 10 mg/dL (ref 8.9–10.3)
Chloride: 104 mmol/L (ref 98–111)
Creatinine, Ser: 0.81 mg/dL (ref 0.44–1.00)
GFR calc Af Amer: >60 mL/min (ref >60)
GFR calc non Af Amer: >60 mL/min (ref >60)
Glucose, Bld: 390 mg/dL — ABNORMAL HIGH (ref 70–99)
Potassium: 3.8 mmol/L (ref 3.5–5.1)
Sodium: 136 mmol/L (ref 135–145)

## 2019-02-10 LAB — CBC
HCT: 45.6 % (ref 36.0–46.0)
Hemoglobin: 15.3 g/dL — ABNORMAL HIGH (ref 12.0–15.0)
MCH: 29.5 pg (ref 26.0–34.0)
MCHC: 33.6 g/dL (ref 30.0–36.0)
MCV: 87.9 fL (ref 80.0–100.0)
Platelets: 271 10*3/uL (ref 150–400)
RBC: 5.19 MIL/uL — ABNORMAL HIGH (ref 3.87–5.11)
RDW: 11.7 % (ref 11.5–15.5)
WBC: 6.8 10*3/uL (ref 4.0–10.5)
nRBC: 0 % (ref 0.0–0.2)

## 2019-02-10 LAB — GLUCOSE, CAPILLARY: Glucose-Capillary: 409 mg/dL — ABNORMAL HIGH (ref 70–99)

## 2019-02-10 LAB — HEMOGLOBIN A1C
Hgb A1c MFr Bld: 11.1 % — ABNORMAL HIGH (ref 4.8–5.6)
Mean Plasma Glucose: 271.87 mg/dL

## 2019-02-10 NOTE — Pre-Procedure Instructions (Addendum)
[facility] [place], [place] [address] [place] [address] Phone: [phone] Fax: [phone]  [place], [place] [address] [place] [address] Phone: [phone] Fax: [phone]  [facility] [place], [place] [address] Phone: [phone] Fax: [phone]  [facility], [place], [place] [address] [place] [address] Phone: [phone] Fax: [phone]      Your procedure is scheduled on  02-15-19  Report to [facility] Main Entrance "A" at 1115A.M., and check in at the Admitting office.  Call this number if you have problems the morning of surgery:  [phone]  Call [phone] if you have any questions prior to your surgery date Monday-Friday 8am-4pm    Remember:  Do not eat or drink after midnight the night before your surgery  Take these medicines the morning of surgery with A SIP OF WATER :  NIFEdipine (PROCARDIA XL/NIFEDICAL XL) EPINEPHrine (AUVI-Q) as needed pantoprazole (PROTONIX)as needed azelastine (ASTELIN)as needed diphenhydrAMINE (BENADRYL)as needed  7 days prior to surgery STOP taking any Aspirin (unless otherwise instructed by your surgeon), Aleve, Naproxen, Ibuprofen, Motrin, Advil, Goody's, BC's, all herbal medications, fish oil, and all vitamins.   WHAT DO I DO ABOUT MY DIABETES MEDICATION?   Do not inject Victoza the morning of surgery.   The day before surgery inject 65U of Insulin Glargine in the morning and 32 U in the evening.The morning of surgery inject 32U of Insulin glargine.  If you get your prescription of Jardiance refilled, do not take it the day before or the day  of surgery.   How to Manage Your Diabetes Before and After Surgery  Why is it important to control my blood sugar before and after surgery? . Improving blood sugar levels before and after surgery helps healing and can limit problems. . A way of improving blood sugar control is eating a healthy diet by: o  Eating less sugar and carbohydrates o  Increasing activity/exercise o  Talking with your doctor about reaching your blood sugar goals . High blood sugars (greater than 180 mg/dL) can raise your risk of infections and slow your recovery, so you will need to focus on controlling your diabetes during the weeks before surgery. . Make sure that the doctor who takes care of your diabetes knows about your planned surgery including the date and location.  How do I manage my blood sugar before surgery? . Check your blood sugar at least 4 times a day, starting 2 days before surgery, to make sure that the level is not too high or low. o Check your blood sugar the morning of your surgery when you wake up and every 2 hours until you get to the Short Stay unit. . If your blood sugar is less than 70 mg/dL, you will need to treat for low blood sugar: o Do not take insulin. o Treat a low blood sugar (less than 70 mg/dL) with  cup of clear juice (cranberry or apple), 4 glucose tablets, OR glucose gel. o Recheck blood sugar in 15 minutes after treatment (to make sure it is greater than 70 mg/dL). If your blood sugar is not  greater than 70 mg/dL on recheck, call [phone] for further instructions. . Report your blood sugar to the short stay nurse when you get to Short Stay.  . If you are admitted to the hospital after surgery: o Your blood sugar will be checked by the staff and you will probably be given insulin after surgery (instead of oral diabetes medicines) to make sure you have good blood sugar levels. o The goal for blood sugar control after surgery is 80-180 mg/dL.   The Morning of Surgery  Do  not wear jewelry, make-up or nail polish.  Do not wear lotions, powders, or perfumes/colognes, or deodorant  Do not shave 48 hours prior to surgery.   Do not bring valuables to the hospital.  [facility] is not responsible for any belongings or valuables.  If you are a smoker, DO NOT Smoke 24 hours prior to surgery IF you wear a CPAP at night please bring your mask, tubing, and machine the morning of surgery   Remember that you must have someone to transport you home after your surgery, and remain with you for 24 hours if you are discharged the same day.   Contacts, glasses, hearing aids, dentures or bridgework may not be worn into surgery.    Leave your suitcase in the car.  After surgery it may be brought to your room.  For patients admitted to the hospital, discharge time will be determined by your treatment team.  Patients discharged the day of surgery will not be allowed to drive home.    Special instructions:   [facility]- Preparing For Surgery  Before surgery, you can play an important role. Because skin is not sterile, your skin needs to be as free of germs as possible. You can reduce the number of germs on your skin by washing with CHG (chlorahexidine gluconate) Soap before surgery.  CHG is an antiseptic cleaner which kills germs and bonds with the skin to continue killing germs even after washing.    Oral Hygiene is also important to reduce your risk of infection.  Remember - BRUSH YOUR TEETH THE MORNING OF SURGERY WITH YOUR REGULAR TOOTHPASTE  Please do not use if you have an allergy to CHG or antibacterial soaps. If your skin becomes reddened/irritated stop using the CHG.  Do not shave (including legs and underarms) for at least 48 hours prior to first CHG shower. It is OK to shave your face.  Please follow these instructions carefully.   1. Shower the NIGHT BEFORE SURGERY and the MORNING OF SURGERY with CHG Soap.   2. If you chose to wash your hair, wash your hair  first as usual with your normal shampoo.  3. After you shampoo, rinse your hair and body thoroughly to remove the shampoo.  4. Use CHG as you would any other liquid soap. You can apply CHG directly to the skin and wash gently with a scrungie or a clean washcloth.   5. Apply the CHG Soap to your body ONLY FROM THE NECK DOWN.  Do not use on open wounds or open sores. Avoid contact with your eyes, ears, mouth and genitals (private parts). Wash Face and genitals (private parts)  with your normal soap.   6. Wash thoroughly, paying special attention to the area where your surgery will be performed.  7. Thoroughly rinse your body with warm water from the neck down.  8. DO NOT shower/wash with your normal soap after using and rinsing off the CHG Soap.  9.  Pat yourself dry with a CLEAN TOWEL.  10. Wear CLEAN PAJAMAS to bed the night before surgery, wear comfortable clothes the morning of surgery  11. Place CLEAN SHEETS on your bed the night of your first shower and DO NOT SLEEP WITH PETS.    Day of Surgery:  Do not apply any deodorants/lotions. Please shower the morning of surgery with the CHG soap  Please wear clean clothes to the hospital/surgery center.   Remember to brush your teeth WITH YOUR REGULAR TOOTHPASTE.   Please read over the following fact sheets that you were given.

## 2019-02-10 NOTE — Progress Notes (Addendum)
Covid test scheduled on 11/12 No h/o symptoms concerning for Covid. No h/o recent travel. No h/o exposure to Covid positive symptoms.  PCP - Roma Schanz  Cardiologist - denies  Endocrinologist- Jones,Autumn Sharie  Chest x-ray - denies  EKG - 05-13-17  Stress Test - >10 yrs back. Negative per patient  ECHO - denies  Cardiac Cath - denies  AICD-denies PM-denies LOOP-denies  Sleep Study - Y CPAP - no  LABS-A1C ASA-  ERAS-NA  HA1C-Today-11.1  (6.7 in 03/02/18). Surgeon notified. Fasting Blood Sugar 409   Lo-127   Hi-409  Checks Blood Sugar ___4__ times a week  Anesthesia-Y. CBG today=409. Pt had breakfast this am (bacon& cheese toast). At 0930AM, pt injected 1.2U of Victoza and 70 U of Basalgar. Pt has run out of her Jardiance Rx for the past 1 1/2 wks. Pt does not regularly check her bld sugars.States her bld sugars run high usually in the evenings with an average in the 250's. Last A1c was 6.7 in Dec 2019. Pt has not had an appt with her Endocrinilogist since Nov of 2019 and had a virtual visit with her PCP in Oct 2020. Advised pt to try to make an appt to see her Endocrinologist or PCP before DOS. Also advised pt to check her blood sugars more frequently.Will check A1C today. Pt asymptomatic. Ebony Hail, [place] made aware.  All instructions explained to the pt, with a verbal understanding of the material. Pt agrees to go over the instructions while at home for a better understanding. Pt also instructed to self quarantine after being tested for COVID-19. The opportunity to ask questions was provided.

## 2019-02-11 ENCOUNTER — Encounter (HOSPITAL_COMMUNITY): Payer: Self-pay

## 2019-02-11 ENCOUNTER — Other Ambulatory Visit (HOSPITAL_COMMUNITY)
Admission: RE | Admit: 2019-02-11 | Discharge: 2019-02-11 | Disposition: A | Discharge status: Home / Self Care | Payer: Managed Care, Other (non HMO) | Source: Ambulatory Visit | Attending: General Surgery | Admitting: General Surgery

## 2019-02-11 DIAGNOSIS — Z20828 Contact with and (suspected) exposure to other viral communicable diseases: Secondary | ICD-10-CM | POA: Diagnosis not present

## 2019-02-11 DIAGNOSIS — Z01812 Encounter for preprocedural laboratory examination: Secondary | ICD-10-CM | POA: Diagnosis present

## 2019-02-11 NOTE — Progress Notes (Signed)
Anesthesia Chart Review:  Case: [id number] Date/Time: 02/15/19 1300   Procedure: LAPAROSCOPIC CHOLECYSTECTOMY (N/A )   Anesthesia type: General   Pre-op diagnosis: biliary dyskinesia   Location: [facility] OR ROOM 09 / [facility]   Surgeon: Ralene Ok, MD      DISCUSSION: Patient is a 48 year old female scheduled for the above procedure.  History includes never smoker (but with passive smoking exposure), HTN, HLD, PCOS, DM2, GERD, palpitations, thyroid goiter, OSA (not using CPAP), menorrhagia (s/p D&C/hydrothermal ablation, insertion of IUD 03/06/18). BMI is consistent with obesity.   Non-fasting CBG at PAT was elevated at 409. She had eaten bacon and cheese toast for breakfast and had taken Basaglar 70 Units and Victoza 1.2 mg. She ran out of Jardiance about 1 1/2 weeks ago. She sees Ronnald Ramp, Autumn, PA-C with endocrinologist (last 12/2017), but had missed her six month follow-up due to [place] pandemic and had not gotten around to scheduling a 12 month visit for 12/2018. She checks her CBGs about 4X/week and had noticed higher readings around 240-250's (typically evening readings), and her lowest fasting has been 127. She had increased Basaglar from 68 Units BID to 70 Units BID on her own because her sugars were running higher. Denied any acute symptoms at her PAT visit. Given her elevated readings, she was advised to contact her endocrinology office regarding DM medication recommendations and also notify of surgery plans. Patient did contact the office on 02/10/19, but is waiting a return phone call since the next available appointment is not until 03/2019. A1c result has since resulted and is elevated at 11.1%, up from 7.8% on 01/09/18. I called this to staff at her endocrinology office. Hopefully, if she can't be seen in the very near future, provider can at least advise on next steps and/or medication adjustments.   I have routed A1c results to Dr. Gevena Cotton and spoke with [facility] triage nurse Sunday Spillers. She will  have him review and is aware that patient would be at increased risk for same day cancellation should her fasting CBG be > 200-249 on the day of surgery. Currently presurgical COVID-19 test is scheduled for 02/11/19. She would need a urine pregnancy test on the day of surgery.    VS: BP (!) 153/87   Pulse 91   Temp (!) 36.4 C (Temporal)   Resp 18   Ht 5' 8.5" (1.74 m)   Wt 110.7 kg   SpO2 97%   BMI 36.57 kg/m    PROVIDERS: Ann Held, DO is PCP - She seen Peri Jefferson, PA-C with [facility] Endocrinologist - [facility] (see Care Everywhere). Currently, last visit 01/09/18.  Rexene Alberts, [place] is Allergist - Star Age, MD is neurologist ([facility])   LABS: Preoperative labs noted. A1c 11.1%, consistent with average glucose of 271.87. (all labs ordered are listed, but only abnormal results are displayed)  Labs Reviewed  GLUCOSE, CAPILLARY - Abnormal; Notable for the following components:      Result Value   Glucose-Capillary 409 (*)    All other components within normal limits  HEMOGLOBIN A1C - Abnormal; Notable for the following components:   Hgb A1c MFr Bld 11.1 (*)    All other components within normal limits  BASIC METABOLIC PANEL - Abnormal; Notable for the following components:   CO2 21 (*)    Glucose, Bld 390 (*)    All other components within normal limits  CBC - Abnormal; Notable for the following components:   RBC 5.19 (*)  Hemoglobin 15.3 (*)    All other components within normal limits    Spirometry 01/22/19:  Good reproducible efforts. FVC: 2.47L (72%) FEV1: 2.12L, 76% predicted FEV1/FVC ratio: 86% Interpretation: Spirometry consistent with possible restrictive disease.   Sleep Study 07/29/16: STUDY RESULTS: Total Recording Time: 6h 14 m Total Apnea/Hypopnea Index (AHI):  29.8/hour Average Oxygen Saturation: 93% Lowest Oxygen Saturation: 83% Average Mean Heart Rate:   72 bpm IMPRESSION:    Obstructive Sleep Apnea (near severe OSA). Given the  patient's medical history and sleep related complaints, treatment with positive airway pressure (in the form of CPAP) is recommended.    IMAGES: CXR 01/13/19: FINDINGS: No edema or consolidation. The heart size and pulmonary vascularity are normal. No adenopathy. No bone lesions. IMPRESSION: No edema or consolidation.   EKG: EKG 03/02/18: NSR   CV: Reported a negative stress test > 10 years ago.   Past Medical History:  Diagnosis Date  . Allergic rhinitis   . Anal fissure 07/15/2017  . Chronic constipation   . Frequency of urination   . GERD (gastroesophageal reflux disease)   . History of palpitations   . Hx of varicella   . Hyperlipidemia   . Hypertension   . Menorrhagia   . OSA (obstructive sleep apnea)    08-07-2017 no cpap due to finanaces  . PCOS (polycystic ovarian syndrome)   . Peripheral neuropathy   . Thyroid goiter   . Type 2 diabetes mellitus with hyperglycemia, with long-term current use of insulin (HCC)   . Urgency of urination     Past Surgical History:  Procedure Laterality Date  . BREAST REDUCTION SURGERY Bilateral 07-18-2003   dr Shon Hough  [facility]  . COLONOSCOPY WITH PROPOFOL N/A 07/15/2017   Procedure: COLONOSCOPY WITH PROPOFOL;  Surgeon: Hilarie Fredrickson, MD;  Location: [facility];  Service: Endoscopy;  Laterality: N/A;  . DILITATION & CURRETTAGE/HYSTROSCOPY WITH HYDROTHERMAL ABLATION N/A 03/06/2018   Procedure: DILATATION & CURETTAGE/HYSTEROSCOPY WITH HYDROTHERMAL ABLATION, INSERTION OF INTRAUTERINE DEVICE;  Surgeon: Hal Morales, MD;  Location: [facility];  Service: Gynecology;  Laterality: N/A;  . invitro fertilization  11/16/2010   [facility]    MEDICATIONS: . azelastine (ASTELIN) 0.1 % nasal spray  . azelastine (OPTIVAR) 0.05 % ophthalmic solution  . diphenhydrAMINE (BENADRYL) 25 MG tablet  . empagliflozin (JARDIANCE) 25 MG TABS tablet  . EPINEPHrine (AUVI-Q) 0.3 mg/0.3 mL IJ SOAJ injection  . ezetimibe (ZETIA) 10 MG  tablet  . famotidine (PEPCID) 20 MG tablet  . ibuprofen (ADVIL) 200 MG tablet  . Insulin Glargine (BASAGLAR KWIKPEN) 100 UNIT/ML SOPN  . levocetirizine (XYZAL) 5 MG tablet  . liraglutide (VICTOZA) 18 MG/3ML SOPN  . montelukast (SINGULAIR) 10 MG tablet  . NIFEdipine (PROCARDIA XL/NIFEDICAL XL) 60 MG 24 hr tablet  . pantoprazole (PROTONIX) 40 MG tablet  . rosuvastatin (CRESTOR) 10 MG tablet   No current facility-administered medications for this encounter.     Shonna Chock, PA-C Surgical Short Stay/Anesthesiology [facility] Phone [phone] [facility] Phone [phone] 02/11/2019 10:30 AM

## 2019-02-12 LAB — NOVEL CORONAVIRUS, NAA (HOSP ORDER, SEND-OUT TO REF LAB; TAT 18-24 HRS): SARS-CoV-2, NAA: NOT DETECTED

## 2019-02-14 NOTE — Anesthesia Preprocedure Evaluation (Signed)
Anesthesia Evaluation  Patient identified by MRN, date of birth, ID band Patient awake    Reviewed: Allergy & Precautions, NPO status , Patient's Chart, lab work & pertinent test results  Airway Mallampati: I  TM Distance: >3 FB Neck ROM: Full    Dental no notable dental hx. (+) Teeth Intact, Dental Advisory Given   Pulmonary sleep apnea (does not use CPAP) ,    Pulmonary exam normal breath sounds clear to auscultation       Cardiovascular hypertension, Pt. on medications Normal cardiovascular exam Rhythm:Regular Rate:Normal     Neuro/Psych negative neurological ROS  negative psych ROS   GI/Hepatic Neg liver ROS, GERD  Controlled,  Endo/Other  diabetes, Type 2, Insulin DependentPCOS  Renal/GU negative Renal ROS  negative genitourinary   Musculoskeletal negative musculoskeletal ROS (+)   Abdominal   Peds  Hematology negative hematology ROS (+)   Anesthesia Other Findings menorrhagia  Reproductive/Obstetrics                             Anesthesia Physical  Anesthesia Plan  ASA: III  Anesthesia Plan: General   Post-op Pain Management:    Induction: Intravenous  PONV Risk Score and Plan: 4 or greater and Dexamethasone, Ondansetron, Midazolam, Treatment may vary due to age or medical condition and Scopolamine patch - Pre-op  Airway Management Planned: Oral ETT  Additional Equipment: None  Intra-op Plan:   Post-operative Plan: Extubation in OR  Informed Consent: I have reviewed the patients History and Physical, chart, labs and discussed the procedure including the risks, benefits and alternatives for the proposed anesthesia with the patient or authorized representative who has indicated his/her understanding and acceptance.     Dental advisory given  Plan Discussed with: CRNA  Anesthesia Plan Comments:         Anesthesia Quick Evaluation

## 2019-02-15 ENCOUNTER — Ambulatory Visit (HOSPITAL_COMMUNITY): Payer: Managed Care, Other (non HMO) | Admitting: Vascular Surgery

## 2019-02-15 ENCOUNTER — Other Ambulatory Visit: Payer: Self-pay

## 2019-02-15 ENCOUNTER — Ambulatory Visit (HOSPITAL_COMMUNITY): Payer: Managed Care, Other (non HMO) | Admitting: Anesthesiology

## 2019-02-15 ENCOUNTER — Encounter (HOSPITAL_COMMUNITY): Payer: Self-pay | Admitting: *Deleted

## 2019-02-15 ENCOUNTER — Ambulatory Visit (HOSPITAL_COMMUNITY)
Admission: RE | Admit: 2019-02-15 | Discharge: 2019-02-15 | Disposition: A | Discharge status: Home / Self Care | Payer: Managed Care, Other (non HMO) | Attending: General Surgery | Admitting: General Surgery

## 2019-02-15 ENCOUNTER — Encounter (HOSPITAL_COMMUNITY)
Admission: RE | Disposition: A | Discharge status: Home / Self Care | Payer: Self-pay | Source: Home / Self Care | Attending: General Surgery

## 2019-02-15 DIAGNOSIS — K219 Gastro-esophageal reflux disease without esophagitis: Secondary | ICD-10-CM | POA: Diagnosis not present

## 2019-02-15 DIAGNOSIS — K828 Other specified diseases of gallbladder: Secondary | ICD-10-CM | POA: Diagnosis not present

## 2019-02-15 DIAGNOSIS — E785 Hyperlipidemia, unspecified: Secondary | ICD-10-CM | POA: Insufficient documentation

## 2019-02-15 DIAGNOSIS — K5900 Constipation, unspecified: Secondary | ICD-10-CM | POA: Diagnosis not present

## 2019-02-15 DIAGNOSIS — Z79899 Other long term (current) drug therapy: Secondary | ICD-10-CM | POA: Diagnosis not present

## 2019-02-15 DIAGNOSIS — Z794 Long term (current) use of insulin: Secondary | ICD-10-CM | POA: Insufficient documentation

## 2019-02-15 DIAGNOSIS — E119 Type 2 diabetes mellitus without complications: Secondary | ICD-10-CM | POA: Insufficient documentation

## 2019-02-15 HISTORY — PX: CHOLECYSTECTOMY: SHX55

## 2019-02-15 LAB — GLUCOSE, CAPILLARY
Glucose-Capillary: 129 mg/dL — ABNORMAL HIGH (ref 70–99)
Glucose-Capillary: 168 mg/dL — ABNORMAL HIGH (ref 70–99)

## 2019-02-15 LAB — POCT PREGNANCY, URINE: Preg Test, Ur: NEGATIVE

## 2019-02-15 SURGERY — LAPAROSCOPIC CHOLECYSTECTOMY
Anesthesia: General | Site: Abdomen

## 2019-02-15 MED ORDER — CHLORHEXIDINE GLUCONATE CLOTH 2 % EX PADS: 6.0000 | MEDICATED_PAD | Freq: Once | CUTANEOUS | Status: DC

## 2019-02-15 MED ORDER — BUPIVACAINE HCL (PF) 0.25 % IJ SOLN
INTRAMUSCULAR | Status: AC
  Filled 2019-02-15: qty 30

## 2019-02-15 MED ORDER — PHENYLEPHRINE 40 MCG/ML (10ML) SYRINGE FOR IV PUSH (FOR BLOOD PRESSURE SUPPORT)
PREFILLED_SYRINGE | INTRAVENOUS | Status: DC | PRN
  Administered 2019-02-15: 80 ug via INTRAVENOUS

## 2019-02-15 MED ORDER — HYDROMORPHONE HCL 1 MG/ML IJ SOLN
INTRAMUSCULAR | Status: AC
  Filled 2019-02-15: qty 1

## 2019-02-15 MED ORDER — SODIUM CHLORIDE 0.9 % IR SOLN
Status: DC | PRN
  Administered 2019-02-15: 1000 mL

## 2019-02-15 MED ORDER — ONDANSETRON HCL 4 MG/2ML IJ SOLN
INTRAMUSCULAR | Status: DC | PRN
  Administered 2019-02-15: 4 mg via INTRAVENOUS

## 2019-02-15 MED ORDER — 0.9 % SODIUM CHLORIDE (POUR BTL) OPTIME
TOPICAL | Status: DC | PRN
  Administered 2019-02-15: 1000 mL

## 2019-02-15 MED ORDER — PHENYLEPHRINE 40 MCG/ML (10ML) SYRINGE FOR IV PUSH (FOR BLOOD PRESSURE SUPPORT)
PREFILLED_SYRINGE | INTRAVENOUS | Status: AC
  Filled 2019-02-15: qty 10

## 2019-02-15 MED ORDER — FENTANYL CITRATE (PF) 250 MCG/5ML IJ SOLN
INTRAMUSCULAR | Status: AC
  Filled 2019-02-15: qty 5

## 2019-02-15 MED ORDER — PROMETHAZINE HCL 25 MG/ML IJ SOLN: 6.2500 mg | INTRAMUSCULAR | Status: DC | PRN

## 2019-02-15 MED ORDER — PROPOFOL 10 MG/ML IV BOLUS
INTRAVENOUS | Status: DC | PRN
  Administered 2019-02-15: 200 mg via INTRAVENOUS

## 2019-02-15 MED ORDER — DEXAMETHASONE SODIUM PHOSPHATE 10 MG/ML IJ SOLN
INTRAMUSCULAR | Status: AC
  Filled 2019-02-15: qty 1

## 2019-02-15 MED ORDER — BUPIVACAINE HCL 0.25 % IJ SOLN
INTRAMUSCULAR | Status: DC | PRN
  Administered 2019-02-15: 8 mL

## 2019-02-15 MED ORDER — GABAPENTIN 300 MG PO CAPS
300.0000 mg | ORAL_CAPSULE | ORAL | Status: AC
  Administered 2019-02-15: 300 mg via ORAL
  Filled 2019-02-15: qty 1

## 2019-02-15 MED ORDER — DEXAMETHASONE SODIUM PHOSPHATE 10 MG/ML IJ SOLN
INTRAMUSCULAR | Status: DC | PRN
  Administered 2019-02-15: 4 mg via INTRAVENOUS

## 2019-02-15 MED ORDER — SUGAMMADEX SODIUM 200 MG/2ML IV SOLN
INTRAVENOUS | Status: DC | PRN
  Administered 2019-02-15: 300 mg via INTRAVENOUS

## 2019-02-15 MED ORDER — LIDOCAINE 2% (20 MG/ML) 5 ML SYRINGE
INTRAMUSCULAR | Status: AC
  Filled 2019-02-15: qty 5

## 2019-02-15 MED ORDER — ROCURONIUM BROMIDE 50 MG/5ML IV SOSY
PREFILLED_SYRINGE | INTRAVENOUS | Status: DC | PRN
  Administered 2019-02-15: 50 mg via INTRAVENOUS

## 2019-02-15 MED ORDER — EPHEDRINE 5 MG/ML INJ
INTRAVENOUS | Status: AC
  Filled 2019-02-15: qty 10

## 2019-02-15 MED ORDER — MEPERIDINE HCL 25 MG/ML IJ SOLN: 6.2500 mg | INTRAMUSCULAR | Status: DC | PRN

## 2019-02-15 MED ORDER — CELECOXIB 200 MG PO CAPS
200.0000 mg | ORAL_CAPSULE | ORAL | Status: AC
  Administered 2019-02-15: 200 mg via ORAL
  Filled 2019-02-15: qty 1

## 2019-02-15 MED ORDER — SUCCINYLCHOLINE CHLORIDE 200 MG/10ML IV SOSY
PREFILLED_SYRINGE | INTRAVENOUS | Status: DC | PRN
  Administered 2019-02-15: 160 mg via INTRAVENOUS

## 2019-02-15 MED ORDER — LACTATED RINGERS IV SOLN
INTRAVENOUS | Status: DC
  Administered 2019-02-15 (×2): via INTRAVENOUS

## 2019-02-15 MED ORDER — MIDAZOLAM HCL 2 MG/2ML IJ SOLN
INTRAMUSCULAR | Status: AC
  Filled 2019-02-15: qty 2

## 2019-02-15 MED ORDER — TRAMADOL HCL 50 MG PO TABS: 50.0000 mg | ORAL_TABLET | Freq: Four times a day (QID) | ORAL | 0 refills | Status: DC | PRN

## 2019-02-15 MED ORDER — EPHEDRINE SULFATE-NACL 50-0.9 MG/10ML-% IV SOSY
PREFILLED_SYRINGE | INTRAVENOUS | Status: DC | PRN
  Administered 2019-02-15 (×2): 10 mg via INTRAVENOUS

## 2019-02-15 MED ORDER — SUCCINYLCHOLINE CHLORIDE 200 MG/10ML IV SOSY
PREFILLED_SYRINGE | INTRAVENOUS | Status: AC
  Filled 2019-02-15: qty 10

## 2019-02-15 MED ORDER — MIDAZOLAM HCL 2 MG/2ML IJ SOLN
INTRAMUSCULAR | Status: DC | PRN
  Administered 2019-02-15: 2 mg via INTRAVENOUS

## 2019-02-15 MED ORDER — TRAMADOL HCL 50 MG PO TABS
50.0000 mg | ORAL_TABLET | Freq: Four times a day (QID) | ORAL | Status: DC | PRN
  Administered 2019-02-15: 50 mg via ORAL

## 2019-02-15 MED ORDER — ONDANSETRON HCL 4 MG/2ML IJ SOLN
INTRAMUSCULAR | Status: AC
  Filled 2019-02-15: qty 2

## 2019-02-15 MED ORDER — CEFAZOLIN SODIUM-DEXTROSE 2-4 GM/100ML-% IV SOLN
2.0000 g | INTRAVENOUS | Status: AC
  Administered 2019-02-15: 2 g via INTRAVENOUS
  Filled 2019-02-15: qty 100

## 2019-02-15 MED ORDER — LIDOCAINE 2% (20 MG/ML) 5 ML SYRINGE
INTRAMUSCULAR | Status: DC | PRN
  Administered 2019-02-15: 80 mg via INTRAVENOUS

## 2019-02-15 MED ORDER — TRAMADOL HCL 50 MG PO TABS
ORAL_TABLET | ORAL | Status: AC
  Filled 2019-02-15: qty 1

## 2019-02-15 MED ORDER — FENTANYL CITRATE (PF) 100 MCG/2ML IJ SOLN
INTRAMUSCULAR | Status: DC | PRN
  Administered 2019-02-15 (×5): 50 ug via INTRAVENOUS

## 2019-02-15 MED ORDER — STERILE WATER FOR IRRIGATION IR SOLN
Status: DC | PRN
  Administered 2019-02-15: 1000 mL

## 2019-02-15 MED ORDER — HYDROMORPHONE HCL 1 MG/ML IJ SOLN
0.2500 mg | INTRAMUSCULAR | Status: DC | PRN
  Administered 2019-02-15: 11:00:00 0.25 mg via INTRAVENOUS

## 2019-02-15 MED ORDER — ACETAMINOPHEN 500 MG PO TABS
1000.0000 mg | ORAL_TABLET | ORAL | Status: AC
  Administered 2019-02-15: 1000 mg via ORAL
  Filled 2019-02-15: qty 2

## 2019-02-15 MED ORDER — ROCURONIUM BROMIDE 10 MG/ML (PF) SYRINGE
PREFILLED_SYRINGE | INTRAVENOUS | Status: AC
  Filled 2019-02-15: qty 10

## 2019-02-15 MED ORDER — PROPOFOL 10 MG/ML IV BOLUS
INTRAVENOUS | Status: AC
  Filled 2019-02-15: qty 20

## 2019-02-15 SURGICAL SUPPLY — 41 items
ADH SKN CLS APL DERMABOND .7 (GAUZE/BANDAGES/DRESSINGS) ×1
APL PRP STRL LF DISP 70% ISPRP (MISCELLANEOUS) ×1
BAG SPEC RTRVL 10 TROC 200 (ENDOMECHANICALS)
CANISTER SUCT 3000ML PPV (MISCELLANEOUS) ×3 IMPLANT
CHLORAPREP W/TINT 26 (MISCELLANEOUS) ×3 IMPLANT
CLIP VESOLOCK MED LG 6/CT (CLIP) ×3 IMPLANT
COVER SURGICAL LIGHT HANDLE (MISCELLANEOUS) ×3 IMPLANT
COVER TRANSDUCER ULTRASND (DRAPES) ×3 IMPLANT
COVER WAND RF STERILE (DRAPES) ×3 IMPLANT
DEFOGGER SCOPE WARMER CLEARIFY (MISCELLANEOUS) IMPLANT
DERMABOND ADVANCED (GAUZE/BANDAGES/DRESSINGS) ×2
DERMABOND ADVANCED .7 DNX12 (GAUZE/BANDAGES/DRESSINGS) ×1 IMPLANT
ELECT REM PT RETURN 9FT ADLT (ELECTROSURGICAL) ×3
ELECTRODE REM PT RTRN 9FT ADLT (ELECTROSURGICAL) ×1 IMPLANT
GLOVE BIO SURGEON STRL SZ7.5 (GLOVE) ×3 IMPLANT
GOWN STRL REUS W/ TWL LRG LVL3 (GOWN DISPOSABLE) ×2 IMPLANT
GOWN STRL REUS W/ TWL XL LVL3 (GOWN DISPOSABLE) ×1 IMPLANT
GOWN STRL REUS W/TWL LRG LVL3 (GOWN DISPOSABLE) ×6
GOWN STRL REUS W/TWL XL LVL3 (GOWN DISPOSABLE) ×3
GRASPER SUT TROCAR 14GX15 (MISCELLANEOUS) ×3 IMPLANT
KIT BASIN OR (CUSTOM PROCEDURE TRAY) ×3 IMPLANT
KIT TURNOVER KIT B (KITS) ×3 IMPLANT
NDL INSUFFLATION 14GA 120MM (NEEDLE) ×1 IMPLANT
NEEDLE INSUFFLATION 14GA 120MM (NEEDLE) ×3 IMPLANT
NS IRRIG 1000ML POUR BTL (IV SOLUTION) ×3 IMPLANT
PAD ARMBOARD 7.5X6 YLW CONV (MISCELLANEOUS) ×3 IMPLANT
POUCH LAPAROSCOPIC INSTRUMENT (MISCELLANEOUS) ×3 IMPLANT
POUCH RETRIEVAL ECOSAC 10 (ENDOMECHANICALS) IMPLANT
POUCH RETRIEVAL ECOSAC 10MM (ENDOMECHANICALS)
SCISSORS LAP 5X35 DISP (ENDOMECHANICALS) ×3 IMPLANT
SET IRRIG TUBING LAPAROSCOPIC (IRRIGATION / IRRIGATOR) ×3 IMPLANT
SET TUBE SMOKE EVAC HIGH FLOW (TUBING) ×3 IMPLANT
SLEEVE ENDOPATH XCEL 5M (ENDOMECHANICALS) ×3 IMPLANT
SPECIMEN JAR SMALL (MISCELLANEOUS) ×3 IMPLANT
SUT MNCRL AB 4-0 PS2 18 (SUTURE) ×3 IMPLANT
TOWEL GREEN STERILE (TOWEL DISPOSABLE) ×3 IMPLANT
TOWEL GREEN STERILE FF (TOWEL DISPOSABLE) ×3 IMPLANT
TRAY LAPAROSCOPIC MC (CUSTOM PROCEDURE TRAY) ×3 IMPLANT
TROCAR XCEL NON-BLD 11X100MML (ENDOMECHANICALS) ×3 IMPLANT
TROCAR XCEL NON-BLD 5MMX100MML (ENDOMECHANICALS) ×3 IMPLANT
WATER STERILE IRR 1000ML POUR (IV SOLUTION) ×3 IMPLANT

## 2019-02-15 NOTE — Anesthesia Postprocedure Evaluation (Signed)
Anesthesia Post Note  Patient: Kathleen Jordan  Procedure(s) Performed: LAPAROSCOPIC CHOLECYSTECTOMY (N/A Abdomen)     Patient location during evaluation: PACU Anesthesia Type: General Level of consciousness: sedated and patient cooperative Pain management: pain level controlled Vital Signs Assessment: post-procedure vital signs reviewed and stable Respiratory status: spontaneous breathing Cardiovascular status: stable Anesthetic complications: no    Last Vitals:  Vitals:   02/15/19 1125 02/15/19 1130  BP: (!) 148/89 (!) 144/88  Pulse: 76 78  Resp: 12 14  Temp:    SpO2: 93% 98%    Last Pain:  Vitals:   02/15/19 1130  TempSrc:   PainSc: 0-No pain                 Nolon Nations

## 2019-02-15 NOTE — Transfer of Care (Signed)
Immediate Anesthesia Transfer of Care Note  Patient: Kathleen Jordan  Procedure(s) Performed: LAPAROSCOPIC CHOLECYSTECTOMY (N/A Abdomen)  Patient Location: PACU  Anesthesia Type:General  Level of Consciousness: awake, alert  and oriented  Airway & Oxygen Therapy: Patient Spontanous Breathing and Patient connected to face mask oxygen  Post-op Assessment: Report given to RN and Post -op Vital signs reviewed and stable  Post vital signs: Reviewed and stable  Last Vitals:  Vitals Value Taken Time  BP 134/84 02/15/19 1024  Temp    Pulse 74 02/15/19 1024  Resp 18 02/15/19 1024  SpO2 99 % 02/15/19 1024  Vitals shown include unvalidated device data.  Last Pain:  Vitals:   02/15/19 0829  TempSrc:   PainSc: 0-No pain         Complications: No apparent anesthesia complications

## 2019-02-15 NOTE — Discharge Instructions (Signed)
[facility] ______[facility] °LAPAROSCOPIC SURGERY: POST OP INSTRUCTIONS °Always review your discharge instruction sheet given to you by the facility where your surgery was performed. °IF YOU HAVE DISABILITY OR FAMILY LEAVE FORMS, YOU MUST BRING THEM TO THE OFFICE FOR PROCESSING.   °DO NOT GIVE THEM TO YOUR DOCTOR. ° °1. A prescription for pain medication may be given to you upon discharge.  Take your pain medication as prescribed, if needed.  If narcotic pain medicine is not needed, then you may take acetaminophen (Tylenol) or ibuprofen (Advil) as needed. °2. Take your usually prescribed medications unless otherwise directed. °3. If you need a refill on your pain medication, please contact your pharmacy.  They will contact our office to request authorization. Prescriptions will not be filled after 5pm or on week-ends. °4. You should follow a light diet the first few days after arrival home, such as soup and crackers, etc.  Be sure to include lots of fluids daily. °5. Most patients will experience some swelling and bruising in the area of the incisions.  Ice packs will help.  Swelling and bruising can take several days to resolve.  °6. It is common to experience some constipation if taking pain medication after surgery.  Increasing fluid intake and taking a stool softener (such as Colace) will usually help or prevent this problem from occurring.  A mild laxative (Milk of Magnesia or Miralax) should be taken according to package instructions if there are no bowel movements after 48 hours. °7. Unless discharge instructions indicate otherwise, you may remove your bandages 24-48 hours after surgery, and you may shower at that time.  You may have steri-strips (small skin tapes) in place directly over the incision.  These strips should be left on the skin for 7-10 days.  If your surgeon used skin glue on the incision, you may shower in 24 hours.  The glue will flake off over the next 2-3 weeks.  Any sutures or  staples will be removed at the office during your follow-up visit. °8. ACTIVITIES:  You may resume regular (light) daily activities beginning the next day--such as daily self-care, walking, climbing stairs--gradually increasing activities as tolerated.  You may have sexual intercourse when it is comfortable.  Refrain from any heavy lifting or straining until approved by your doctor. °a. You may drive when you are no longer taking prescription pain medication, you can comfortably wear a seatbelt, and you can safely maneuver your car and apply brakes. °b. RETURN TO WORK:  __________________________________________________________ °9. You should see your doctor in the office for a follow-up appointment approximately 2-3 weeks after your surgery.  Make sure that you call for this appointment within a day or two after you arrive home to insure a convenient appointment time. °10. OTHER INSTRUCTIONS: __________________________________________________________________________________________________________________________ __________________________________________________________________________________________________________________________ °WHEN TO CALL YOUR DOCTOR: °1. Fever over 101.0 °2. Inability to urinate °3. Continued bleeding from incision. °4. Increased pain, redness, or drainage from the incision. °5. Increasing abdominal pain ° °The clinic staff is available to answer your questions during regular business hours.  Please don’t hesitate to call and ask to speak to one of the nurses for clinical concerns.  If you have a medical emergency, go to the nearest emergency room or call 911.  A surgeon from [facility] is always on call at the hospital. °[address], [address], [place], [place]  [address] ? [address] [address], [place], [place]   [address] °[phone] ? [phone] ? FAX [phone] °Web site:   [email or web address] °

## 2019-02-15 NOTE — H&P (Signed)
History of Present Illness  The patient is a 48 year old female who presents for evaluation of gall stones. Patient is a 48 year old female who follows back up today secondary to abdominal pain. Patient states that she's continue with her abdominal pain usually with high fatty foods, spicy foods, citric foods to include for. She states that she's had some looser bowel movements. She does state she has some reflux as well. Patient recently underwent HIDA scan which revealed an ejection fraction of 24%. Patient states this did clinic some abdominal pain and discomfort with this study. I did review the HIDA scan personally.  -------------------------------------  Referred by: Dr. Roma Schanz Chief Complaint: Abdominal pain  Patient is a 48 year old female with a history of diabetes, reflux, hyperlipidemia. She comes in with several month history of right abdominal pain. States that have been previously on and off. She said recently showed to be bout of constant abdominal pain. States it's more on and off. She states the pain is usually after eating high fatty meals, or spicy meals.  Patient also gives a history of constipation. She states that she has a bowel movement once a day and possibly once every other day. She states that the stools are firm.  Patient underwent ultrasound which I reviewed personally which revealed no gallstones. Patient's LFTs within normal limits.    Allergies No Known Drug Allergies  [06/01/2018]: Allergies Reconciled   Medication History Omeprazole (40MG  Capsule DR, Oral) Active. Jardiance (25MG  Tablet, Oral) Active. Fluticasone Furoate (27.5MCG/SPRAY Suspension, Nasal) Active. Basaglar KwikPen (100UNIT/ML Soln Pen-inj, Subcutaneous) Active. Victoza (18MG /3ML Soln Pen-inj, Subcutaneous) Active. Famotidine (20MG  Tablet, Oral) Active. Ezetimibe (10MG  Tablet, Oral) Active. NIFEdipine ER Osmotic Release (60MG  Tablet ER 24HR,  Oral) Active. Cetirizine HCl (10MG  Tablet, Oral) Active. Medications Reconciled Montelukast Sodium (10MG  Tablet, Oral) Active. Pantoprazole Sodium (40MG  Tablet DR, Oral) Active.    Review of Systems  General Present- Fatigue. Not Present- Appetite Loss, Chills, Fever, Night Sweats, Weight Gain and Weight Loss. Skin Present- Dryness. Not Present- Change in Wart/Mole, Hives, Jaundice, New Lesions, Non-Healing Wounds, Rash and Ulcer. HEENT Present- Sinus Pain and Wears glasses/contact lenses. Not Present- Earache, Hearing Loss, Hoarseness, Nose Bleed, Oral Ulcers, Ringing in the Ears, Seasonal Allergies, Sore Throat, Visual Disturbances and Yellow Eyes. Respiratory Not Present- Bloody sputum, Chronic Cough, Difficulty Breathing, Snoring and Wheezing. Breast Not Present- Breast Mass, Breast Pain, Nipple Discharge and Skin Changes. Cardiovascular Present- Leg Cramps. Not Present- Chest Pain, Difficulty Breathing Lying Down, Palpitations, Rapid Heart Rate, Shortness of Breath and Swelling of Extremities. Gastrointestinal Present- Constipation, Excessive gas and Gets full quickly at meals. Not Present- Abdominal Pain, Bloating, Bloody Stool, Change in Bowel Habits, Chronic diarrhea, Difficulty Swallowing, Hemorrhoids, Indigestion, Nausea, Rectal Pain and Vomiting. Female Genitourinary Not Present- Frequency, Nocturia, Painful Urination, Pelvic Pain and Urgency. Musculoskeletal Not Present- Back Pain, Joint Pain, Joint Stiffness, Muscle Pain, Muscle Weakness and Swelling of Extremities. Neurological Not Present- Decreased Memory, Fainting, Headaches, Numbness, Seizures, Tingling, Tremor, Trouble walking and Weakness. Psychiatric Not Present- Anxiety, Bipolar, Change in Sleep Pattern, Depression, Fearful and Frequent crying. Endocrine Not Present- Cold Intolerance, Excessive Hunger, Hair Changes, Heat Intolerance, Hot flashes and New Diabetes. Hematology Not Present- Blood Thinners, Easy Bruising,  Excessive bleeding, Gland problems, HIV and Persistent Infections. All other systems negative  BP 138/85   Pulse 86   Temp 98.4 F (36.9 C) (Oral)   Resp 18   Ht 5' 8.5" (1.74 m)   Wt 110.7 kg   SpO2 98%  BMI 36.56 kg/m    Physical Exam  The physical exam findings are as follows: Note: Constitutional: No acute distress, conversant, appears stated age  Eyes: Anicteric sclerae, moist conjunctiva, no lid lag  Neck: No thyromegaly, trachea midline, no cervical lymphadenopathy  Lungs: Clear to auscultation biilaterally, normal respiratory effot  Cardiovascular: regular rate & rhythm, no murmurs, no peripheal edema, pedal pulses 2+  GI: Soft, no masses or hepatosplenomegaly, non-tender to palpation  MSK: Normal gait, no clubbing cyanosis, edema  Skin: No rashes, palpation reveals normal skin turgor  Psychiatric: Appropriate judgment and insight, oriented to person, place, and time    Assessment & Plan   BILIARY DYSKINESIA (K82.8) Impression: Patient is a 48 year old female with a history of diabetes, hyperlipidemia, reflux, who follows back up for biliary dyskinesia. Her HIDA scan did reveal an ejection fraction of 24%. Patient continues to have similar symptoms she had in the past. 1. We will proceed to the operating room for a laparoscopic cholecystectomy 2. Risks and benefits were discussed with the patient to generally include, but not limited to: infection, bleeding, possible need for post op ERCP, damage to the bile ducts, bile leak, and possible need for further surgery. Alternatives were offered and described. All questions were answered and the patient voiced understanding of the procedure and wishes to proceed at this point with a laparoscopic cholecystectomy

## 2019-02-15 NOTE — Op Note (Signed)
02/15/2019  10:05 AM  PATIENT:  Kathleen Jordan  48 y.o. female  PRE-OPERATIVE DIAGNOSIS:  biliary dyskinesia  POST-OPERATIVE DIAGNOSIS:  biliary dyskinesia  PROCEDURE:  Procedure(s): LAPAROSCOPIC CHOLECYSTECTOMY (N/A)  SURGEON:  Surgeon(s) and Role:    * Ralene Ok, MD - Primary    * Gosai, Puja, PA-C - Assisting  ANESTHESIA:   local and general  EBL:  minimal   BLOOD ADMINISTERED:none  DRAINS: none   LOCAL MEDICATIONS USED:  BUPIVICAINE   SPECIMEN:  Source of Specimen:  gallbladder  DISPOSITION OF SPECIMEN:  PATHOLOGY  COUNTS:  YES  TOURNIQUET:  * No tourniquets in log *  DICTATION: .Dragon Dictation The patient was taken to the operating and placed in the supine position with bilateral SCDs in place.  The patient was prepped and draped in the usual sterile fashion. A time out was called and all facts were verified. A pneumoperitoneum was obtained via A Veress needle technique to a pressure of 41mm of mercury.  A 70mm trochar was then placed in the right upper quadrant under visualization, and there were no injuries to any abdominal organs. A 11 mm port was then placed in the umbilical region after infiltrating with local anesthesia under direct visualization. A second and third epigastric port and right lower quadrant port placement under direct visualization, respectively.    The gallbladder was identified and retracted, the peritoneum was then sharply dissected from the gallbladder and this dissection was carried down to Calot's triangle. The gallbladder was identified and stripped away circumferentially and seen going into the gallbladder 360, the critical angle was obtained.  2 plastic hemoclips were placed proximally one distally and the cystic duct transected. The cystic artery was identified and 2 plastic hemoclips placed proximally and one distally and transected.  We then proceeded to remove the gallbladder off the hepatic fossa with Bovie cautery. A  retrieval bag was then placed in the abdomen and gallbladder placed in the bag. The hepatic fossa was then reexamined and hemostasis was achieved with Bovie cautery and was excellent at the end of the case.   The subhepatic fossa and perihepatic fossa was then irrigated until the effluent was clear.  The gallbladder and bag were removed from the abdominal cavity. The 11 mm trocar fascia was reapproximated with the Endo Close #1 Vicryl x1.  The pneumoperitoneum was evacuated and all trochars removed under direct visulalization.  The skin was then closed with 4-0 Monocryl and the skin dressed with Dermabond.    My assistant was required to help with retraction and dissection throughout the case.  The patient was awaken from general anesthesia and taken to the recovery room in stable condition.   PLAN OF CARE: Discharge to home after PACU  PATIENT DISPOSITION:  PACU - hemodynamically stable.   Delay start of Pharmacological VTE agent (>24hrs) due to surgical blood loss or risk of bleeding: not applicable

## 2019-02-15 NOTE — Anesthesia Procedure Notes (Signed)
Procedure Name: Intubation Date/Time: 02/15/2019 9:30 AM Performed by: Genelle Bal, CRNA Pre-anesthesia Checklist: Patient identified, Emergency Drugs available, Suction available and Patient being monitored Patient Re-evaluated:Patient Re-evaluated prior to induction Oxygen Delivery Method: Circle system utilized Preoxygenation: Pre-oxygenation with 100% oxygen Induction Type: IV induction Ventilation: Mask ventilation without difficulty and Oral airway inserted - appropriate to patient size Laryngoscope Size: Mac and 3 Grade View: Grade I Tube type: Oral Tube size: 7.0 mm Number of attempts: 1 Airway Equipment and Method: Stylet and Oral airway Placement Confirmation: ETT inserted through vocal cords under direct vision,  positive ETCO2 and breath sounds checked- equal and bilateral Secured at: 23 cm Tube secured with: Tape Dental Injury: Teeth and Oropharynx as per pre-operative assessment  Comments: Intubation by Reva Bores, Paramedic Student

## 2019-02-16 ENCOUNTER — Encounter (HOSPITAL_COMMUNITY): Payer: Self-pay | Admitting: General Surgery

## 2019-02-16 LAB — SURGICAL PATHOLOGY

## 2019-03-12 ENCOUNTER — Ambulatory Visit (INDEPENDENT_AMBULATORY_CARE_PROVIDER_SITE_OTHER): Payer: Managed Care, Other (non HMO) | Admitting: Family Medicine

## 2019-03-12 ENCOUNTER — Other Ambulatory Visit: Payer: Self-pay

## 2019-03-12 ENCOUNTER — Encounter: Payer: Self-pay | Admitting: Family Medicine

## 2019-03-12 VITALS — BP 137/79 | HR 92 | Temp 97.1°F | Ht 68.5 in

## 2019-03-12 DIAGNOSIS — R05 Cough: Secondary | ICD-10-CM

## 2019-03-12 DIAGNOSIS — Z20822 Contact with and (suspected) exposure to covid-19: Secondary | ICD-10-CM

## 2019-03-12 DIAGNOSIS — R059 Cough, unspecified: Secondary | ICD-10-CM

## 2019-03-12 DIAGNOSIS — J0140 Acute pansinusitis, unspecified: Secondary | ICD-10-CM | POA: Diagnosis not present

## 2019-03-12 MED ORDER — PROMETHAZINE-DM 6.25-15 MG/5ML PO SYRP: 5.0000 mL | ORAL_SOLUTION | Freq: Four times a day (QID) | ORAL | 0 refills | Status: DC | PRN

## 2019-03-12 NOTE — Progress Notes (Signed)
Virtual Visit via Video Note  I connected with Kathleen Jordan on 03/12/19 at 10:20 AM EST by a video enabled telemedicine application and verified that I am speaking with the correct person using two identifiers.  Location: Patient: home alone  Provider: office    I discussed the limitations of evaluation and management by telemedicine and the availability of in person appointments. The patient expressed understanding and agreed to proceed.  History of Present Illness: Pt c/o week hx cough and sinus congestion she had augmentin from a previous illness that she never took    She started taking that and is feeling better    She is also taking her nasal spray and mucinex and zyrtec Observations/Objective: Vitals:   03/12/19 1010  BP: 137/79  Pulse: 92  Temp: (!) 97.1 F (36.2 C)  SpO2: 93%  pt is in NAD  Assessment and Plan:  1. Cough ] - promethazine-dextromethorphan (PROMETHAZINE-DM) 6.25-15 MG/5ML syrup; Take 5 mLs by mouth 4 (four) times daily as needed.  Dispense: 118 mL; Refill: 0  2. Acute non-recurrent pansinusitis Finish augmentin Use flonase and antihistamine Pt will get covid test at [facility]  .Follow Up Instructions:    I discussed the assessment and treatment plan with the patient. The patient was provided an opportunity to ask questions and all were answered. The patient agreed with the plan and demonstrated an understanding of the instructions.   The patient was advised to call back or seek an in-person evaluation if the symptoms worsen or if the condition fails to improve as anticipated.  I provided 15 minutes of non-face-to-face time during this encounter.   Ann Held, DO

## 2019-03-14 ENCOUNTER — Encounter: Payer: Self-pay | Admitting: Family Medicine

## 2019-03-14 LAB — NOVEL CORONAVIRUS, NAA: SARS-CoV-2, NAA: NOT DETECTED

## 2019-03-16 NOTE — Telephone Encounter (Signed)
augmentin would have treated both

## 2019-04-09 ENCOUNTER — Ambulatory Visit: Payer: Managed Care, Other (non HMO) | Admitting: Allergy

## 2019-04-14 ENCOUNTER — Other Ambulatory Visit: Payer: Self-pay

## 2019-04-14 ENCOUNTER — Encounter: Payer: Self-pay | Admitting: Allergy

## 2019-04-14 ENCOUNTER — Ambulatory Visit (INDEPENDENT_AMBULATORY_CARE_PROVIDER_SITE_OTHER): Payer: Managed Care, Other (non HMO) | Admitting: Allergy

## 2019-04-14 DIAGNOSIS — T781XXD Other adverse food reactions, not elsewhere classified, subsequent encounter: Secondary | ICD-10-CM

## 2019-04-14 DIAGNOSIS — J329 Chronic sinusitis, unspecified: Secondary | ICD-10-CM

## 2019-04-14 DIAGNOSIS — J3089 Other allergic rhinitis: Secondary | ICD-10-CM | POA: Diagnosis not present

## 2019-04-14 DIAGNOSIS — H1013 Acute atopic conjunctivitis, bilateral: Secondary | ICD-10-CM | POA: Diagnosis not present

## 2019-04-14 NOTE — Patient Instructions (Addendum)
Environmental allergies   2020 skin testing showed: Positive to dust mites and cockroaches.   Continue environmental control measures - especially regarding the dust mites.   May use over the counter antihistamines such as Zyrtec (cetirizine), Claritin (loratadine), Allegra (fexofenadine), or Xyzal (levocetirizine) daily as needed. ? May take twice a day if needed.   Decrease Flonase to 2 sprays per nostril daily for nasal congestion.  May use Astelin 1-2 sprays per nostril twice a day as needed for runny nose.  Nasal saline spray (i.e., Simply Saline) or nasal saline lavage (i.e., NeilMed) is recommended as needed and prior to medicated nasal sprays.  Stop Optivar eye drops for now as they can dry out the eyes.  Try over the counter refresh eye drops to help with the dryness.   If above regimen not effective, will discuss starting allergy immunotherapy next.     Adverse food reaction  2020 skin testing was negative to foods.  Continue to avoid foods that bother you - grapes, strawberries, mangoes, kiwi, pineapples, peanut, tree nuts, shellfish, seafood.  Get bloodwork:  We are ordering labs, so please allow 1-2 weeks for the results to come back. With the newly implemented Cures Act, the labs might be visible to you at the same time that they become visible to me. However, I will not address the results until all of the results are back, so please be patient.  In the meantime, continue recommendations in your patient instructions, including avoidance measures (if applicable), until you hear from me.  You may have some component of lactose intolerance as well.  Try Lactaid products.  Food ation plan in place.  For mild symptoms you can take over the counter antihistamines such as Benadryl and monitor symptoms closely. If symptoms worsen or if you have severe symptoms including breathing issues, throat closure, significant swelling, whole body hives, severe diarrhea and vomiting,  lightheadedness then inject epinephrine and seek immediate medical care afterwards.  Frequent sinus infections  Keep track of infections.  Get bloodwork.   We will mail the bloodwork orders to your home. Please get it drawn before your next visit.   Follow up in 1 month or sooner if needed.   Control of House Dust Mite Allergen . Dust mite allergens are a common trigger of allergy and asthma symptoms. While they can be found throughout the house, these microscopic creatures thrive in warm, humid environments such as bedding, upholstered furniture and carpeting. . Because so much time is spent in the bedroom, it is essential to reduce mite levels there.  . Encase pillows, mattresses, and box springs in special allergen-proof fabric covers or airtight, zippered plastic covers.  . Bedding should be washed weekly in hot water (130 F) and dried in a hot dryer. Allergen-proof covers are available for comforters and pillows that can't be regularly washed.  Wendee Copp the allergy-proof covers every few months. Minimize clutter in the bedroom. Keep pets out of the bedroom.  Marland Kitchen Keep humidity less than 50% by using a dehumidifier or air conditioning. You can buy a humidity measuring device called a hygrometer to monitor this.  . If possible, replace carpets with hardwood, linoleum, or washable area rugs. If that's not possible, vacuum frequently with a vacuum that has a HEPA filter. . Remove all upholstered furniture and non-washable window drapes from the bedroom. . Remove all non-washable stuffed toys from the bedroom.  Wash stuffed toys weekly.  Cockroach Allergen Avoidance Cockroaches are often found in the homes of  densely populated urban areas, schools or commercial buildings, but these creatures can lurk almost anywhere. This does not mean that you have a dirty house or living area. . Block all areas where roaches can enter the home. This includes crevices, wall cracks and windows.  . Cockroaches  need water to survive, so fix and seal all leaky faucets and pipes. Have an exterminator go through the house when your family and pets are gone to eliminate any remaining roaches. Marland Kitchen Keep food in lidded containers and put pet food dishes away after your pets are done eating. Vacuum and sweep the floor after meals, and take out garbage and recyclables. Use lidded garbage containers in the kitchen. Wash dishes immediately after use and clean under stoves, refrigerators or toasters where crumbs can accumulate. Wipe off the stove and other kitchen surfaces and cupboards regularly.

## 2019-04-14 NOTE — Assessment & Plan Note (Signed)
Past history - Noticed perioral pruritus and skin pruritus for the past year after consuming fresh grapes, strawberries, mangoes, kiwi, pineapples, peanuts and tree nuts.  Symptoms usually resolve after a day after antihistamines.  Dairy products, eggs, beef, leafy vegetables cause some GI discomfort.  Seafood and shellfish sometimes causes swelling of the feet and even contact with the shrimp can cause discomfort in her hands. 2020 skin testing was negative to foods. Interim history - did not get bloodwork yet. Continue to avoid foods that bother you - grapes, strawberries, mangoes, kiwi, pineapples, peanut, tree nuts, shellfish, seafood.  The beef, eggs, and leafy vegetables causing GI issues is more likely a non-IgE mediated reaction.  Get bloodwork  You may have some component of lactose intolerance as well.  Try Lactaid products.  Food action plan in place.  For mild symptoms you can take over the counter antihistamines such as Benadryl and monitor symptoms closely. If symptoms worsen or if you have severe symptoms including breathing issues, throat closure, significant swelling, whole body hives, severe diarrhea and vomiting, lightheadedness then inject epinephrine and seek immediate medical care afterwards.

## 2019-04-14 NOTE — Assessment & Plan Note (Addendum)
Past history - Perennial rhinoconjunctivitis symptoms for the past 5 years.  Triggers include dust.  Tried over-the-counter antihistamines, Singulair, Flonase, Astelin and eyedrops with minimal benefit.   No previous allergy immunotherapy.  No previous ENT evaluation. 2020 skin testing showed: Positive to dust mites and cockroaches.  Interim history - still having nasal congestion and dry/itchy eyes. Singulair caused drowsiness.   Continue environmental control measures - especially regarding the dust mites.   May use over the counter antihistamines such as Zyrtec (cetirizine), Claritin (loratadine), Allegra (fexofenadine), or Xyzal (levocetirizine) daily as needed. ? May take twice a day if needed.   Decrease Flonase to 2 sprays per nostril daily for nasal congestion.  May use Astelin 1-2 sprays per nostril twice a day as needed for runny nose.  Nasal saline spray (i.e., Simply Saline) or nasal saline lavage (i.e., NeilMed) is recommended as needed and prior to medicated nasal sprays.  Stop Optivar eye drops for now as they can dry out the eyes.  Try over the counter refresh eye drops to help with the dryness.   If above regimen not effective, will discuss starting allergy immunotherapy next.

## 2019-04-14 NOTE — Assessment & Plan Note (Signed)
Past history - History of frequent sinus infections.  No previous immune evaluation. Interim history - One course of antibiotics in December for sinus infection. Did not get bloodwork yet.   Keep track of infections.  Get bloodwork.

## 2019-04-14 NOTE — Progress Notes (Signed)
RE: Kathleen Jordan MRN: [id number] DOB: 1970-07-18 Date of Telemedicine Visit: 04/14/2019  Referring provider: [name], * Primary care provider: Zola Button, Grayling Congress, DO  Chief Complaint: Allergic Rhinitis    Telemedicine Follow Up Visit via Telephone: I connected with Kathleen Jordan for a follow up on 04/14/19 by telephone and verified that I am speaking with the correct person using two identifiers.   I discussed the limitations, risks, security and privacy concerns of performing an evaluation and management service by telephone and the availability of in person appointments. I also discussed with the patient that there may be a patient responsible charge related to this service. The patient expressed understanding and agreed to proceed.  Patient is at home/work. Provider is at the office.  Visit start time: 11:39AM Visit end time: 11:52AM Insurance consent/check in by: front desk Medical consent and medical assistant/nurse: Darreld Mclean S.  History of Present Illness: She is a 49 y.o. female, who is being followed for allergic rhinoconjunctivitis, adverse food reaction, frequent sinus infections and shortness of breath. Her previous allergy office visit was on 01/22/2019 with Dr. Selena Batten. Today is a regular follow up visit.  Allergic rhino conjunctivitis  Has been doing okay but has been having itchy/dry eyes. Currently taking zyrtec 10mg  daily at night. Does not take during the mornings as it cause drowsiness.  Using Flonase 2 sprays BID with nosebleeds with some benefit.  Using optivar 1 drop in AM with some benefit.   Had recent eye exam.   Montelukast caused drowsiness so she stopped. No dust mite covers in place yet.   Adverse food reaction Avoiding all dairy due to lactose intolerance No bloodwork drawn yet.  Frequent sinus infections Had 1 course of antibiotics for sinus infection in December.   Shortness of breath Resolved.   Patient had  gallbladder surgery.  On a new insulin medication.  Assessment and Plan: Kathleen Jordan is a 49 y.o. female with: Perennial allergic rhinitis Past history - Perennial rhinoconjunctivitis symptoms for the past 5 years.  Triggers include dust.  Tried over-the-counter antihistamines, Singulair, Flonase, Astelin and eyedrops with minimal benefit.   No previous allergy immunotherapy.  No previous ENT evaluation. 2020 skin testing showed: Positive to dust mites and cockroaches.  Interim history - still having nasal congestion and dry/itchy eyes. Singulair caused drowsiness.   Continue environmental control measures - especially regarding the dust mites.   May use over the counter antihistamines such as Zyrtec (cetirizine), Claritin (loratadine), Allegra (fexofenadine), or Xyzal (levocetirizine) daily as needed. ? May take twice a day if needed.   Decrease Flonase to 2 sprays per nostril daily for nasal congestion.  May use Astelin 1-2 sprays per nostril twice a day as needed for runny nose.  Nasal saline spray (i.e., Simply Saline) or nasal saline lavage (i.e., NeilMed) is recommended as needed and prior to medicated nasal sprays.  Stop Optivar eye drops for now as they can dry out the eyes.  Try over the counter refresh eye drops to help with the dryness.   If above regimen not effective, will discuss starting allergy immunotherapy next.    Adverse food reaction Past history - Noticed perioral pruritus and skin pruritus for the past year after consuming fresh grapes, strawberries, mangoes, kiwi, pineapples, peanuts and tree nuts.  Symptoms usually resolve after a day after antihistamines.  Dairy products, eggs, beef, leafy vegetables cause some GI discomfort.  Seafood and shellfish sometimes causes swelling of the feet and even contact with the  shrimp can cause discomfort in her hands. 2020 skin testing was negative to foods. Interim history - did not get bloodwork yet. Continue to avoid foods that  bother you - grapes, strawberries, mangoes, kiwi, pineapples, peanut, tree nuts, shellfish, seafood.  The beef, eggs, and leafy vegetables causing GI issues is more likely a non-IgE mediated reaction.  Get bloodwork  You may have some component of lactose intolerance as well.  Try Lactaid products.  Food action plan in place.  For mild symptoms you can take over the counter antihistamines such as Benadryl and monitor symptoms closely. If symptoms worsen or if you have severe symptoms including breathing issues, throat closure, significant swelling, whole body hives, severe diarrhea and vomiting, lightheadedness then inject epinephrine and seek immediate medical care afterwards.  Frequent sinus infections Past history - History of frequent sinus infections.  No previous immune evaluation. Interim history - One course of antibiotics in December for sinus infection. Did not get bloodwork yet.   Keep track of infections.  Get bloodwork.   Return in about 4 weeks (around 05/12/2019).  Diagnostics: None.  Medication List:  Current Outpatient Medications  Medication Sig Dispense Refill  . azelastine (ASTELIN) 0.1 % nasal spray Place 2 sprays into both nostrils 2 (two) times daily. Use in each nostril as directed (Patient taking differently: Place 2 sprays into both nostrils daily as needed for allergies. ) 30 mL 2  . azelastine (OPTIVAR) 0.05 % ophthalmic solution Place 1 drop into both eyes 2 (two) times daily. 6 mL 12  . diphenhydrAMINE (BENADRYL) 25 MG tablet Take 25 mg by mouth every 6 (six) hours as needed for allergies.    Marland Kitchen empagliflozin (JARDIANCE) 25 MG TABS tablet Take 25 mg by mouth every evening.     Marland Kitchen EPINEPHrine (AUVI-Q) 0.3 mg/0.3 mL IJ SOAJ injection Inject 0.3 mLs (0.3 mg total) into the muscle as needed for anaphylaxis. 1 each 1  . ezetimibe (ZETIA) 10 MG tablet TAKE 1 TABLET(10 MG) BY MOUTH DAILY (Patient taking differently: Take 10 mg by mouth every evening. ) 90 tablet 1   . ibuprofen (ADVIL) 200 MG tablet Take 400 mg by mouth every 6 (six) hours as needed for headache or moderate pain.    . Insulin Glargine (BASAGLAR KWIKPEN) 100 UNIT/ML SOPN Inject 65 Units into the skin 2 (two) times daily.     Marland Kitchen levocetirizine (XYZAL) 5 MG tablet Take 1 tablet (5 mg total) by mouth every evening. 90 tablet 3  . levonorgestrel (MIRENA, 52 MG,) 20 MCG/24HR IUD Mirena 20 mcg/24 hours (6 yrs) 52 mg intrauterine device  Take 1 device by intrauterine route.    Marland Kitchen NIFEdipine (PROCARDIA XL/NIFEDICAL XL) 60 MG 24 hr tablet Take 1 tablet (60 mg total) by mouth daily. (Patient taking differently: Take 60 mg by mouth at bedtime. ) 90 tablet 1  . rosuvastatin (CRESTOR) 10 MG tablet Take 1 tablet (10 mg total) by mouth at bedtime. 30 tablet 2  . Semaglutide,0.25 or 0.5MG /DOS, (OZEMPIC, 0.25 OR 0.5 MG/DOSE,) 2 MG/1.5ML SOPN Ozempic 0.25 mg or 0.5 mg (2 mg/1.5 mL) subcutaneous pen injector    . Oxymetazoline HCl (NASAL SPRAY) 0.05 % SOLN Anefrin 0.05 % nasal mist  U 1 SPR IEN TWICE DAILY. DO NOT U FOR MORE THAN 3 DAYS    . promethazine-dextromethorphan (PROMETHAZINE-DM) 6.25-15 MG/5ML syrup Take 5 mLs by mouth 4 (four) times daily as needed. (Patient not taking: Reported on 04/14/2019) 118 mL 0   No current facility-administered medications for this  visit.   Allergies: No Known Allergies I reviewed her past medical history, social history, family history, and environmental history and no significant changes have been reported from her previous visit.  Review of Systems  Constitutional: Negative for appetite change, chills, fever and unexpected weight change.  HENT: Positive for congestion.   Eyes: Positive for itching.  Respiratory: Negative for cough, chest tightness, shortness of breath and wheezing.   Cardiovascular: Negative for chest pain.  Gastrointestinal: Negative for abdominal pain.  Genitourinary: Negative for difficulty urinating.  Skin: Negative for rash.   Allergic/Immunologic: Positive for environmental allergies.  Neurological: Negative for headaches.   Objective: Physical Exam   Not obtained as encounter was done via telephone.   Previous notes and tests were reviewed.  I discussed the assessment and treatment plan with the patient. The patient was provided an opportunity to ask questions and all were answered. The patient agreed with the plan and demonstrated an understanding of the instructions. After visit summary/patient instructions available via mychart.   The patient was advised to call back or seek an in-person evaluation if the symptoms worsen or if the condition fails to improve as anticipated.  I provided 13 minutes of non-face-to-face time during this encounter.  It was my pleasure to participate in [place]'s care today. Please feel free to contact me with any questions or concerns.   Sincerely,  Wyline Mood, DO Allergy & Immunology  [facility] office: [phone] [facility] office: [phone] [place] office: [phone]

## 2019-04-19 ENCOUNTER — Other Ambulatory Visit: Payer: Self-pay

## 2019-04-19 ENCOUNTER — Encounter: Payer: Self-pay | Admitting: Family Medicine

## 2019-04-19 ENCOUNTER — Ambulatory Visit (INDEPENDENT_AMBULATORY_CARE_PROVIDER_SITE_OTHER): Payer: Managed Care, Other (non HMO) | Admitting: Family Medicine

## 2019-04-19 VITALS — BP 132/75 | HR 81 | Temp 98.1°F | Ht 68.5 in | Wt 246.0 lb

## 2019-04-19 DIAGNOSIS — E785 Hyperlipidemia, unspecified: Secondary | ICD-10-CM | POA: Diagnosis not present

## 2019-04-19 DIAGNOSIS — I10 Essential (primary) hypertension: Secondary | ICD-10-CM | POA: Diagnosis not present

## 2019-04-19 MED ORDER — ROSUVASTATIN CALCIUM 10 MG PO TABS: 10.0000 mg | ORAL_TABLET | Freq: Every day | ORAL | 2 refills | Status: DC

## 2019-04-19 MED ORDER — EZETIMIBE 10 MG PO TABS: ORAL_TABLET | ORAL | 1 refills | Status: DC

## 2019-04-19 NOTE — Progress Notes (Signed)
Virtual Visit via Video Note  I connected with Kathleen Jordan on 04/19/19 at  9:20 AM EST by a video enabled telemedicine application and verified that I am speaking with the correct person using two identifiers.  Location: Patient: home alone  Provider: office    I discussed the limitations of evaluation and management by telemedicine and the availability of in person appointments. The patient expressed understanding and agreed to proceed.  History of Present Illness: Pt is home and needs refills on meds No complaints Did discuss covid vaccine --   Observations/Objective: Today's Vitals   04/19/19 0927  BP: 132/75  Pulse: 81  Temp: 98.1 F (36.7 C)  Weight: 246 lb (111.6 kg)  Height: 5' 8.5" (1.74 m)   Body mass index is 36.86 kg/m. Pt is in NAD   Assessment and Plan: 1. Hyperlipidemia, unspecified hyperlipidemia type Tolerating statin, encouraged heart healthy diet, avoid trans fats, minimize simple carbs and saturated fats. Increase exercise as tolerated - rosuvastatin (CRESTOR) 10 MG tablet; Take 1 tablet (10 mg total) by mouth at bedtime.  Dispense: 30 tablet; Refill: 2 - ezetimibe (ZETIA) 10 MG tablet; TAKE 1 TABLET(10 MG) BY MOUTH DAILY  Dispense: 90 tablet; Refill: 1  2. Essential hypertension Well controlled, no changes to meds. Encouraged heart healthy diet such as the DASH diet and exercise as tolerated.   We did discuss covid vaccine and pts questions were answered --- info printed on avs    Follow Up Instructions:    I discussed the assessment and treatment plan with the patient. The patient was provided an opportunity to ask questions and all were answered. The patient agreed with the plan and demonstrated an understanding of the instructions.   The patient was advised to call back or seek an in-person evaluation if the symptoms worsen or if the condition fails to improve as anticipated.     [name], DO

## 2019-04-19 NOTE — Patient Instructions (Signed)
COVID-19 Vaccine Information can be found at: [email or web address] For questions related to vaccine distribution or appointments, please email [email or web address] or call [phone].    

## 2019-05-05 ENCOUNTER — Other Ambulatory Visit: Payer: Self-pay | Admitting: Allergy

## 2019-05-11 LAB — CBC WITH DIFFERENTIAL/PLATELET
Basophils Absolute: 0.1 10*3/uL (ref 0.0–0.2)
Basos: 1 %
EOS (ABSOLUTE): 0.2 10*3/uL (ref 0.0–0.4)
Eos: 2 %
Hematocrit: 44.4 % (ref 34.0–46.6)
Hemoglobin: 15.3 g/dL (ref 11.1–15.9)
Immature Grans (Abs): 0.1 10*3/uL (ref 0.0–0.1)
Immature Granulocytes: 1 %
Lymphocytes Absolute: 3.7 10*3/uL — ABNORMAL HIGH (ref 0.7–3.1)
Lymphs: 41 %
MCH: 29.7 pg (ref 26.6–33.0)
MCHC: 34.5 g/dL (ref 31.5–35.7)
MCV: 86 fL (ref 79–97)
Monocytes Absolute: 0.5 10*3/uL (ref 0.1–0.9)
Monocytes: 6 %
Neutrophils Absolute: 4.5 10*3/uL (ref 1.4–7.0)
Neutrophils: 49 %
Platelets: 287 10*3/uL (ref 150–450)
RBC: 5.16 x10E6/uL (ref 3.77–5.28)
RDW: 13 % (ref 11.7–15.4)
WBC: 8.9 10*3/uL (ref 3.4–10.8)

## 2019-05-11 LAB — STREP PNEUMONIAE 23 SEROTYPES IGG
Pneumo Ab Type 1*: 2.9 ug/mL (ref >1.3)
Pneumo Ab Type 12 (12F)*: 0.4 ug/mL — ABNORMAL LOW (ref >1.3)
Pneumo Ab Type 14*: 4.2 ug/mL (ref >1.3)
Pneumo Ab Type 17 (17F)*: 4.5 ug/mL (ref >1.3)
Pneumo Ab Type 19 (19F)*: 4.3 ug/mL (ref >1.3)
Pneumo Ab Type 2*: 3.2 ug/mL (ref >1.3)
Pneumo Ab Type 20*: 3.4 ug/mL (ref >1.3)
Pneumo Ab Type 22 (22F)*: 1.2 ug/mL — ABNORMAL LOW (ref >1.3)
Pneumo Ab Type 23 (23F)*: 0.1 ug/mL — ABNORMAL LOW (ref >1.3)
Pneumo Ab Type 26 (6B)*: 5.1 ug/mL (ref >1.3)
Pneumo Ab Type 3*: 53.1 ug/mL (ref >1.3)
Pneumo Ab Type 34 (10A)*: 1.4 ug/mL (ref >1.3)
Pneumo Ab Type 4*: 0.9 ug/mL — ABNORMAL LOW (ref >1.3)
Pneumo Ab Type 43 (11A)*: 2.3 ug/mL (ref >1.3)
Pneumo Ab Type 5*: 51.9 ug/mL (ref >1.3)
Pneumo Ab Type 51 (7F)*: 1.2 ug/mL — ABNORMAL LOW (ref >1.3)
Pneumo Ab Type 54 (15B)*: 4.8 ug/mL (ref >1.3)
Pneumo Ab Type 56 (18C)*: 6.4 ug/mL (ref >1.3)
Pneumo Ab Type 57 (19A)*: 2.7 ug/mL (ref >1.3)
Pneumo Ab Type 68 (9V)*: 1.1 ug/mL — ABNORMAL LOW (ref >1.3)
Pneumo Ab Type 70 (33F)*: 3.3 ug/mL (ref >1.3)
Pneumo Ab Type 8*: >25.3 ug/mL (ref >1.3)
Pneumo Ab Type 9 (9N)*: 5.8 ug/mL (ref >1.3)

## 2019-05-11 LAB — FOOD ALLERGY PROFILE
Allergen Corn, IgE: <0.1 kU/L
Clam IgE: 0.1 kU/L — AB
Codfish IgE: <0.1 kU/L
Egg White IgE: <0.1 kU/L
Milk IgE: 0.2 kU/L — AB
Peanut IgE: <0.1 kU/L
Scallop IgE: 0.16 kU/L — AB
Sesame Seed IgE: <0.1 kU/L
Shrimp IgE: 0.26 kU/L — AB
Soybean IgE: <0.1 kU/L
Walnut IgE: <0.1 kU/L
Wheat IgE: <0.1 kU/L

## 2019-05-11 LAB — ALLERGEN GRAPE F259: Allergen Grape IgE: <0.1 kU/L

## 2019-05-11 LAB — DIPHTHERIA / TETANUS ANTIBODY PANEL
Diphtheria Ab: 0.64 IU/mL (ref <0.10)
Tetanus Ab, IgG: 1.74 IU/mL (ref <0.10)

## 2019-05-11 LAB — TRYPTASE: Tryptase: 9.6 ug/L (ref 2.2–13.2)

## 2019-05-11 LAB — ALLERGENS(7)
Brazil Nut IgE: <0.1 kU/L
F020-IgE Almond: <0.1 kU/L
F202-IgE Cashew Nut: <0.1 kU/L
Hazelnut (Filbert) IgE: <0.1 kU/L
Pecan Nut IgE: <0.1 kU/L

## 2019-05-11 LAB — ALLERGEN, STRAWBERRY, F44: Allergen Strawberry IgE: <0.1 kU/L

## 2019-05-11 LAB — ALPHA-GAL PANEL
Alpha Gal IgE*: <0.1 kU/L (ref <0.10)
Beef (Bos spp) IgE: 0.15 kU/L (ref <0.35)
Class Interpretation: 0
Lamb/Mutton (Ovis spp) IgE: 0.1 kU/L (ref <0.35)
Pork (Sus spp) IgE: <0.1 kU/L (ref <0.35)

## 2019-05-11 LAB — IGG, IGA, IGM
IgA/Immunoglobulin A, Serum: 259 mg/dL (ref 87–352)
IgG (Immunoglobin G), Serum: 1303 mg/dL (ref 586–1602)
IgM (Immunoglobulin M), Srm: 224 mg/dL — ABNORMAL HIGH (ref 26–217)

## 2019-05-11 LAB — ALLERGEN, MANGO, F91: Mango IgE: <0.1 kU/L

## 2019-05-12 ENCOUNTER — Other Ambulatory Visit: Payer: Self-pay

## 2019-05-12 ENCOUNTER — Ambulatory Visit (INDEPENDENT_AMBULATORY_CARE_PROVIDER_SITE_OTHER): Payer: Managed Care, Other (non HMO) | Admitting: Family Medicine

## 2019-05-12 ENCOUNTER — Ambulatory Visit (INDEPENDENT_AMBULATORY_CARE_PROVIDER_SITE_OTHER): Payer: Managed Care, Other (non HMO) | Admitting: Allergy

## 2019-05-12 ENCOUNTER — Encounter: Payer: Self-pay | Admitting: Family Medicine

## 2019-05-12 ENCOUNTER — Encounter: Payer: Self-pay | Admitting: Allergy

## 2019-05-12 VITALS — BP 140/78 | HR 100 | Temp 97.0°F | Ht 68.5 in

## 2019-05-12 DIAGNOSIS — R52 Pain, unspecified: Secondary | ICD-10-CM | POA: Diagnosis not present

## 2019-05-12 DIAGNOSIS — J3089 Other allergic rhinitis: Secondary | ICD-10-CM | POA: Diagnosis not present

## 2019-05-12 DIAGNOSIS — H1013 Acute atopic conjunctivitis, bilateral: Secondary | ICD-10-CM | POA: Diagnosis not present

## 2019-05-12 DIAGNOSIS — Z8709 Personal history of other diseases of the respiratory system: Secondary | ICD-10-CM | POA: Diagnosis not present

## 2019-05-12 DIAGNOSIS — T781XXD Other adverse food reactions, not elsewhere classified, subsequent encounter: Secondary | ICD-10-CM

## 2019-05-12 MED ORDER — OLOPATADINE HCL 0.1 % OP SOLN: 1.0000 [drp] | Freq: Two times a day (BID) | OPHTHALMIC | 5 refills | Status: DC | PRN

## 2019-05-12 MED ORDER — CETIRIZINE HCL 10 MG PO TABS: 10.0000 mg | ORAL_TABLET | Freq: Every day | ORAL | 11 refills | Status: DC

## 2019-05-12 NOTE — Progress Notes (Signed)
RE: Kathleen Jordan MRN: [id number] DOB: 1971-03-26 Date of Telemedicine Visit: 05/12/2019  Referring provider: [name], * Primary care provider: Zola Button, Grayling Congress, DO  Chief Complaint: Allergic Rhinitis  and URI (Covid testing being performed)   Telemedicine Follow Up Visit via Telephone: I connected with [name] for a follow up on 05/12/19 by telephone and verified that I am speaking with the correct person using two identifiers.   I discussed the limitations, risks, security and privacy concerns of performing an evaluation and management service by telephone and the availability of in person appointments. I also discussed with the patient that there may be a patient responsible charge related to this service. The patient expressed understanding and agreed to proceed.  Patient is at home/work. Provider is at the office.  Visit start time: 11:19AM Visit end time: 11:34AM Insurance consent/check in by: front desk Medical consent and medical assistant/nurse: Irwin Brakeman.  History of Present Illness: She is a 49 y.o. female, who is being followed for allergic rhinitis, allergic reaction and frequent sinus infections. Her previous allergy office visit was on 04/14/2019 with Dr. Selena Batten via telemedicine. Today is a regular follow up visit.  Perennial allergic rhinitis Patient has not been doing well the last few days as has been having issues with coughing and chest pains.  She is scheduled to get COVID testing tomorrow. No sick contacts.   Zyrtec caused some drowsiness if she took it in the morning and only taking at night now.   Using visine allergy eye drops but still having itchy eyes daily for the past few months. Eye exam normal per patient report.   Using Astelin nasal spray as needed with good benefit.   Adverse food reaction Currently avoiding grapes, strawberries, mangoes, kiwi, pineapples, peanut, tree nuts, shellfish, seafood.  Uses lactaid  milk with no issues. Still having some GI issues with constipation.   Frequent sinus infections No infections since the last visit.  Had bloodwork drawn but still pending.   Assessment and Plan: [name] is a 49 y.o. female with: Perennial allergic rhinitis Past history - Perennial rhinoconjunctivitis symptoms for the past 5 years.  Triggers include dust.  Tried over-the-counter antihistamines, Singulair, Flonase, Astelin and eyedrops with minimal benefit. 2020 skin testing showed: Positive to dust mites and cockroaches. Singulair caused drowsiness.  Interim history - can't tolerate BID antihistamine dosing as it causes drowsiness. Still having some itchy eyes.  Continue environmental control measures - especially regarding the dust mites.   May use over the counter antihistamines such as Zyrtec (cetirizine), Claritin (loratadine), Allegra (fexofenadine), or Xyzal (levocetirizine) daily as needed.  May use Flonase 2 sprays per nostril daily for nasal congestion.  May use Astelin 1-2 sprays per nostril twice a day as needed for runny nose.  Nasal saline spray (i.e., Simply Saline) or nasal saline lavage (i.e., NeilMed) is recommended as needed and prior to medicated nasal sprays.  May use olopatadine eye drops 0.1% twice a day as needed for itchy/watery eyes.  If above regimen not effective, will discuss starting allergy immunotherapy next.    Mailed information about allergy shots to patient.  Allergic conjunctivitis of both eyes  See assessment and plan as above for allergic rhinitis.  Adverse food reaction Past history - Noticed perioral pruritus and skin pruritus for the past year after consuming fresh grapes, strawberries, mangoes, kiwi, pineapples, peanuts and tree nuts.  Symptoms usually resolve after a day after antihistamines.  Dairy products, eggs, beef, leafy vegetables  cause some GI discomfort.  Seafood and shellfish sometimes causes swelling of the feet and even contact  with the shrimp can cause discomfort in her hands. 2020 skin testing was negative to foods. Interim history - bloodwork pending, better with lactaid milk.  Continue to avoid foods that bother you - grapes, strawberries, mangoes, kiwi, pineapples, peanut, tree nuts, shellfish, seafood.   The beef, eggs, and leafy vegetables causing GI issues is more likely a non-IgE mediated reaction.   Food action plan in place.  For mild symptoms you can take over the counter antihistamines such as Benadryl and monitor symptoms closely. If symptoms worsen or if you have severe symptoms including breathing issues, throat closure, significant swelling, whole body hives, severe diarrhea and vomiting, lightheadedness then inject epinephrine and seek immediate medical care afterwards.  Follow up with PCP regarding your GI issues.   History of frequent URI Past history - History of frequent sinus infections.  No previous immune evaluation. Interim history - No additional infections. Bloodwork is pending.   Keep track of infections.  Will send a message once all the bloodwork is back.   Return in about 4 months (around 09/09/2019).  Meds ordered this encounter  Medications  . olopatadine (PATANOL) 0.1 % ophthalmic solution    Sig: Place 1 drop into both eyes 2 (two) times daily as needed for allergies (itchy/watery eyes).    Dispense:  5 mL    Refill:  5  . cetirizine (ZYRTEC ALLERGY) 10 MG tablet    Sig: Take 1 tablet (10 mg total) by mouth daily.    Dispense:  30 tablet    Refill:  11   Diagnostics: None.  Medication List:  Current Outpatient Medications  Medication Sig Dispense Refill  . azelastine (ASTELIN) 0.1 % nasal spray Place 2 sprays into both nostrils 2 (two) times daily. Use in each nostril as directed (Patient taking differently: Place 2 sprays into both nostrils daily as needed for allergies. ) 30 mL 2  . diphenhydrAMINE (BENADRYL) 25 MG tablet Take 25 mg by mouth every 6 (six) hours  as needed for allergies.    Marland Kitchen empagliflozin (JARDIANCE) 25 MG TABS tablet Take 25 mg by mouth every evening.     Marland Kitchen EPINEPHrine (AUVI-Q) 0.3 mg/0.3 mL IJ SOAJ injection Inject 0.3 mLs (0.3 mg total) into the muscle as needed for anaphylaxis. 1 each 1  . ezetimibe (ZETIA) 10 MG tablet TAKE 1 TABLET(10 MG) BY MOUTH DAILY 90 tablet 1  . glucose blood (CONTOUR NEXT TEST) test strip Check BG 3 times/day.  Dx E11.65    . ibuprofen (ADVIL) 200 MG tablet Take 400 mg by mouth every 6 (six) hours as needed for headache or moderate pain.    . Insulin Glargine (BASAGLAR KWIKPEN) 100 UNIT/ML SOPN Inject 65 Units into the skin 2 (two) times daily.     Marland Kitchen levonorgestrel (MIRENA, 52 MG,) 20 MCG/24HR IUD Mirena 20 mcg/24 hours (6 yrs) 52 mg intrauterine device  Take 1 device by intrauterine route.    . naphazoline-pheniramine (VISINE) 0.025-0.3 % ophthalmic solution Place 1 drop into both eyes 4 (four) times daily as needed for eye irritation or allergies.    Marland Kitchen NIFEdipine (PROCARDIA XL/NIFEDICAL XL) 60 MG 24 hr tablet Take 1 tablet (60 mg total) by mouth daily. (Patient taking differently: Take 60 mg by mouth at bedtime. ) 90 tablet 1  . rosuvastatin (CRESTOR) 10 MG tablet Take 1 tablet (10 mg total) by mouth at bedtime. 30 tablet 2  .  Semaglutide,0.25 or 0.5MG /DOS, (OZEMPIC, 0.25 OR 0.5 MG/DOSE,) 2 MG/1.5ML SOPN Ozempic 0.25 mg or 0.5 mg (2 mg/1.5 mL) subcutaneous pen injector    . cetirizine (ZYRTEC ALLERGY) 10 MG tablet Take 1 tablet (10 mg total) by mouth daily. 30 tablet 11  . olopatadine (PATANOL) 0.1 % ophthalmic solution Place 1 drop into both eyes 2 (two) times daily as needed for allergies (itchy/watery eyes). 5 mL 5  . senna (SENOKOT) 8.6 MG tablet Take by mouth.     No current facility-administered medications for this visit.   Allergies: No Known Allergies I reviewed her past medical history, social history, family history, and environmental history and no significant changes have been reported from  her previous visit.  Review of Systems  Constitutional: Negative for appetite change, chills, fever and unexpected weight change.  HENT: Positive for congestion.   Eyes: Positive for itching.  Respiratory: Negative for cough, chest tightness, shortness of breath and wheezing.   Cardiovascular: Negative for chest pain.  Gastrointestinal: Negative for abdominal pain.  Genitourinary: Negative for difficulty urinating.  Skin: Negative for rash.  Allergic/Immunologic: Positive for environmental allergies.  Neurological: Negative for headaches.   Objective: Physical Exam Not obtained as encounter was done via telephone.   Previous notes and tests were reviewed.  I discussed the assessment and treatment plan with the patient. The patient was provided an opportunity to ask questions and all were answered. The patient agreed with the plan and demonstrated an understanding of the instructions. After visit summary/patient instructions available via mychart.   The patient was advised to call back or seek an in-person evaluation if the symptoms worsen or if the condition fails to improve as anticipated.  I provided 15 minutes of non-face-to-face time during this encounter.  It was my pleasure to participate in [place]'s care today. Please feel free to contact me with any questions or concerns.   Sincerely,  Rexene Alberts, DO Allergy & Immunology  [facility] office: [phone] [facility] office: [place] office: [phone]

## 2019-05-12 NOTE — Assessment & Plan Note (Signed)
Past history - History of frequent sinus infections.  No previous immune evaluation. Interim history - No additional infections. Bloodwork is pending.   Keep track of infections.  Will send a message once all the bloodwork is back.

## 2019-05-12 NOTE — Assessment & Plan Note (Signed)
Past history - Perennial rhinoconjunctivitis symptoms for the past 5 years.  Triggers include dust.  Tried over-the-counter antihistamines, Singulair, Flonase, Astelin and eyedrops with minimal benefit. 2020 skin testing showed: Positive to dust mites and cockroaches. Singulair caused drowsiness.  Interim history - can't tolerate BID antihistamine dosing as it causes drowsiness. Still having some itchy eyes.  Continue environmental control measures - especially regarding the dust mites.   May use over the counter antihistamines such as Zyrtec (cetirizine), Claritin (loratadine), Allegra (fexofenadine), or Xyzal (levocetirizine) daily as needed.  May use Flonase 2 sprays per nostril daily for nasal congestion.  May use Astelin 1-2 sprays per nostril twice a day as needed for runny nose.  Nasal saline spray (i.e., Simply Saline) or nasal saline lavage (i.e., NeilMed) is recommended as needed and prior to medicated nasal sprays.  May use olopatadine eye drops 0.1% twice a day as needed for itchy/watery eyes.  If above regimen not effective, will discuss starting allergy immunotherapy next.    Mailed information about allergy shots to patient.

## 2019-05-12 NOTE — Assessment & Plan Note (Signed)
Past history - Noticed perioral pruritus and skin pruritus for the past year after consuming fresh grapes, strawberries, mangoes, kiwi, pineapples, peanuts and tree nuts.  Symptoms usually resolve after a day after antihistamines.  Dairy products, eggs, beef, leafy vegetables cause some GI discomfort.  Seafood and shellfish sometimes causes swelling of the feet and even contact with the shrimp can cause discomfort in her hands. 2020 skin testing was negative to foods. Interim history - bloodwork pending, better with lactaid milk.  Continue to avoid foods that bother you - grapes, strawberries, mangoes, kiwi, pineapples, peanut, tree nuts, shellfish, seafood.   The beef, eggs, and leafy vegetables causing GI issues is more likely a non-IgE mediated reaction.   Food action plan in place.  For mild symptoms you can take over the counter antihistamines such as Benadryl and monitor symptoms closely. If symptoms worsen or if you have severe symptoms including breathing issues, throat closure, significant swelling, whole body hives, severe diarrhea and vomiting, lightheadedness then inject epinephrine and seek immediate medical care afterwards.  Follow up with PCP regarding your GI issues.

## 2019-05-12 NOTE — Patient Instructions (Addendum)
Perennial allergic rhinitis 2020 skin testing showed: Positive to dust mites and cockroaches.   Continue environmental control measures - especially regarding the dust mites.   May use over the counter antihistamines such as Zyrtec (cetirizine), Claritin (loratadine), Allegra (fexofenadine), or Xyzal (levocetirizine) daily as needed. ? I mailed in prescription for zyrtec.   May use Flonase 2 sprays per nostril daily for nasal congestion.  May use Astelin 1-2 sprays per nostril twice a day as needed for runny nose.  Nasal saline spray (i.e., Simply Saline) or nasal saline lavage (i.e., NeilMed) is recommended as needed and prior to medicated nasal sprays.  May use olopatadine eye drops 0.1% twice a day as needed for itchy/watery eyes.  If above regimen not effective, will discuss starting allergy immunotherapy next.    Mailed information about allergy shots to your home.   Adverse food reaction 2020 skin testing was negative to foods. Bloodwork is pending.   Continue to avoid foods that bother you - grapes, strawberries, mangoes, kiwi, pineapples, peanut, tree nuts, shellfish, seafood.   The beef, eggs, and leafy vegetables causing GI issues is more likely a non-IgE mediated reaction.   Food action plan in place.  For mild symptoms you can take over the counter antihistamines such as Benadryl and monitor symptoms closely. If symptoms worsen or if you have severe symptoms including breathing issues, throat closure, significant swelling, whole body hives, severe diarrhea and vomiting, lightheadedness then inject epinephrine and seek immediate medical care afterwards.  Follow up with PCP regarding your GI issues.   Frequent sinus infections  Keep track of infections.  Will send you a message once all the bloodwork is back.   Follow up in 4 months or sooner if needed.

## 2019-05-12 NOTE — Progress Notes (Signed)
Virtual Visit via Video Note  I connected with Kathleen Jordan on 05/12/19 at 10:00 AM EST by a video enabled telemedicine application and verified that I am speaking with the correct person using two identifiers.  Location: Patient: home  Provider: home    I discussed the limitations of evaluation and management by telemedicine and the availability of in person appointments. The patient expressed understanding and agreed to proceed.  History of Present Illness: Pt is home c/o bodyaches, dry cough and chest pressure x several days.  No fevers No sob  no congestion   No palpitations Observations/Objective: Vitals:   05/12/19 0947  BP: 140/78  Pulse: 100  Temp: (!) 97 F (36.1 C)   PO 95% RA--  Repeat pulse 86 Pt in nad   Assessment and Plan:  1. Body aches Pt to be seen in resp clinic tomorrow If chest pain worsens or she becomes sob--  Go to ER  Use mucinex or delsym otc She may go get rapid covid test today    Follow Up Instructions:     I discussed the assessment and treatment plan with the patient. The patient was provided an opportunity to ask questions and all were answered. The patient agreed with the plan and demonstrated an understanding of the instructions.   The patient was advised to call back or seek an in-person evaluation if the symptoms worsen or if the condition fails to improve as anticipated.  I provided 30 minutes of non-face-to-face time during this encounter.   [name], DO

## 2019-05-12 NOTE — Assessment & Plan Note (Signed)
Pt to be seen in resp clinic tomorrow If chest pain worsens or she becomes sob--  Go to ER  Use mucinex or delsym otc She may go get rapid covid test today

## 2019-05-12 NOTE — Assessment & Plan Note (Signed)
   See assessment and plan as above for allergic rhinitis.  

## 2019-05-13 ENCOUNTER — Ambulatory Visit: Payer: Managed Care, Other (non HMO)

## 2019-05-14 ENCOUNTER — Ambulatory Visit (INDEPENDENT_AMBULATORY_CARE_PROVIDER_SITE_OTHER): Payer: Managed Care, Other (non HMO) | Admitting: Internal Medicine

## 2019-05-14 ENCOUNTER — Encounter: Payer: Self-pay | Admitting: Internal Medicine

## 2019-05-14 ENCOUNTER — Ambulatory Visit: Payer: Managed Care, Other (non HMO) | Admitting: Family Medicine

## 2019-05-14 VITALS — BP 140/82 | HR 87 | Temp 98.4°F | Wt 250.1 lb

## 2019-05-14 DIAGNOSIS — Z20822 Contact with and (suspected) exposure to covid-19: Secondary | ICD-10-CM | POA: Diagnosis not present

## 2019-05-14 DIAGNOSIS — R52 Pain, unspecified: Secondary | ICD-10-CM | POA: Diagnosis not present

## 2019-05-14 DIAGNOSIS — J029 Acute pharyngitis, unspecified: Secondary | ICD-10-CM

## 2019-05-14 DIAGNOSIS — R05 Cough: Secondary | ICD-10-CM

## 2019-05-14 DIAGNOSIS — R059 Cough, unspecified: Secondary | ICD-10-CM

## 2019-05-14 NOTE — Progress Notes (Signed)
This visit occurred during the SARS-CoV-2 public health emergency.  Safety protocols were in place, including screening questions prior to the visit, additional usage of staff PPE, and extensive cleaning of exam room while observing appropriate contact time as indicated for disinfecting solutions.  Subjective:     Patient ID: Kathleen Jordan , female    DOB: May 10, 1970 , 49 y.o.   MRN: [id number]   CC " COVID symptoms and SOB"  HPI Onset of HA and cough body aches 9 days ago and contnues. Has had mild nause. Denies fever. ST onset 2 days ago. Denies rashes. Is able to smell and taste. Denies exposure to covid. Has been taking Mucinex cough med.  The cough is non productive, has also has rhinitis.  Has had pneunia several years ago. Her son had strep 2 weeks ago. She has had bronchitis in the past but never had pneumonia before.   Past Medical History:  Diagnosis Date  . Allergic rhinitis   . Anal fissure 07/15/2017  . Chronic constipation   . Frequency of urination   . GERD (gastroesophageal reflux disease)   . History of palpitations   . Hx of varicella   . Hyperlipidemia   . Hypertension   . Menorrhagia   . OSA (obstructive sleep apnea)    08-07-2017 no cpap due to finanaces  . PCOS (polycystic ovarian syndrome)   . Peripheral neuropathy   . Thyroid goiter   . Type 2 diabetes mellitus with hyperglycemia, with long-term current use of insulin (HCC)   . Urgency of urination      Family History  Problem Relation Age of Onset  . Arthritis Mother   . Diabetes Mother   . Hypertension Mother   . Heart disease Mother 23       MI  . Stroke Mother   . Congestive Heart Failure Mother   . Allergic rhinitis Mother   . Asthma Mother   . Allergic rhinitis Sister   . Diabetes Brother   . Hypertension Brother   . Arthritis Maternal Grandmother   . Diabetes Maternal Grandmother   . Hypertension Maternal Grandmother   . Cancer Maternal Grandmother   . Stroke Maternal  Grandfather   . Cancer Maternal Grandfather   . Asthma Sister   . Allergies Sister      Current Outpatient Medications:  .  azelastine (ASTELIN) 0.1 % nasal spray, Place 2 sprays into both nostrils 2 (two) times daily. Use in each nostril as directed (Patient taking differently: Place 2 sprays into both nostrils daily as needed for allergies. ), Disp: 30 mL, Rfl: 2 .  cetirizine (ZYRTEC ALLERGY) 10 MG tablet, Take 1 tablet (10 mg total) by mouth daily., Disp: 30 tablet, Rfl: 11 .  diphenhydrAMINE (BENADRYL) 25 MG tablet, Take 25 mg by mouth every 6 (six) hours as needed for allergies., Disp: , Rfl:  .  empagliflozin (JARDIANCE) 25 MG TABS tablet, Take 25 mg by mouth every evening. , Disp: , Rfl:  .  EPINEPHrine (AUVI-Q) 0.3 mg/0.3 mL IJ SOAJ injection, Inject 0.3 mLs (0.3 mg total) into the muscle as needed for anaphylaxis., Disp: 1 each, Rfl: 1 .  ezetimibe (ZETIA) 10 MG tablet, TAKE 1 TABLET(10 MG) BY MOUTH DAILY, Disp: 90 tablet, Rfl: 1 .  glucose blood (CONTOUR NEXT TEST) test strip, Check BG 3 times/day.  Dx E11.65, Disp: , Rfl:  .  ibuprofen (ADVIL) 200 MG tablet, Take 400 mg by mouth every 6 (six) hours as needed for headache  or moderate pain., Disp: , Rfl:  .  Insulin Glargine (BASAGLAR KWIKPEN) 100 UNIT/ML SOPN, Inject 65 Units into the skin 2 (two) times daily. , Disp: , Rfl:  .  levonorgestrel (MIRENA, 52 MG,) 20 MCG/24HR IUD, Mirena 20 mcg/24 hours (6 yrs) 52 mg intrauterine device  Take 1 device by intrauterine route., Disp: , Rfl:  .  naphazoline-pheniramine (VISINE) 0.025-0.3 % ophthalmic solution, Place 1 drop into both eyes 4 (four) times daily as needed for eye irritation or allergies., Disp: , Rfl:  .  NIFEdipine (PROCARDIA XL/NIFEDICAL XL) 60 MG 24 hr tablet, Take 1 tablet (60 mg total) by mouth daily. (Patient taking differently: Take 60 mg by mouth at bedtime. ), Disp: 90 tablet, Rfl: 1 .  olopatadine (PATANOL) 0.1 % ophthalmic solution, Place 1 drop into both eyes 2 (two)  times daily as needed for allergies (itchy/watery eyes)., Disp: 5 mL, Rfl: 5 .  rosuvastatin (CRESTOR) 10 MG tablet, Take 1 tablet (10 mg total) by mouth at bedtime., Disp: 30 tablet, Rfl: 2 .  Semaglutide,0.25 or 0.5MG /DOS, (OZEMPIC, 0.25 OR 0.5 MG/DOSE,) 2 MG/1.5ML SOPN, Ozempic 0.25 mg or 0.5 mg (2 mg/1.5 mL) subcutaneous pen injector, Disp: , Rfl:  .  senna (SENOKOT) 8.6 MG tablet, Take by mouth., Disp: , Rfl:    No Known Allergies   Review of Systems  Her glucose are unchaged since illness. Denies SOB or fever. + for L chest pain, fatigue, cough, rhinitis and ST.  Has L chest wall pain and pain under her shoulder. This pain is not provoked with palpation or activity. Denies edema, leg pain or calf pain.  The rest of 10 point ROS is neg Today's Vitals   05/14/19 1848  BP: 140/82  Pulse: 87  Temp: 98.4 F (36.9 C)  SpO2: 98%  Weight: 250 lb 1.9 oz (113.5 kg)   Body mass index is 37.48 kg/m.   Objective:  Physical Exam   Constitutional: She is oriented to person, place, and time. She appears well-developed and well-nourished. No distress.  HENT: Tm's gray and shiny, Pharynx is clear, Nose mucosa is normal with clear mucous.  Head: Normocephalic and atraumatic.  Right Ear: External ear normal.  Left Ear: External ear normal.  Nose: Nose normal.  Eyes: Conjunctivae are normal. Right eye exhibits no discharge. Left eye exhibits no discharge. No scleral icterus.  Neck: Neck supple. No thyromegaly present.  No carotid bruits bilaterally  Cardiovascular: Normal rate and regular rhythm.  No murmur heard. Pulmonary/Chest: Effort normal and breath sounds normal. No respiratory distress. I was unable to provoke her L chest pain with palpation.  Musculoskeletal: Normal range of motion. She exhibits no edema.  Lymphadenopathy:    She has no cervical adenopathy.  Neurological: She is alert and oriented to person, place, and time.  Skin: Skin is warm and dry. Capillary refill takes less  than 2 seconds. No rash noted. She is not diaphoretic.  Psychiatric: She has a normal mood and affect. Her behavior is normal. Judgment and thought content normal.  Nursing note reviewed.     Assessment And Plan:  1. Body aches- persisting > 1 week - Novel Coronavirus, NAA ([facility])  2. Pharyngitis, unspecified etiology- pharynx is not suspicious for acute infection. We will call her with results.  - Culture, Group A Strep; Future  3. COVID-19 virus test result unknown- with symptoms. Immune support supplements suggested as noted in instructions.     She will watch out Mychart for her results.  4. Cough- chest xray wont be back today and she was explained will be back tomorrow. She left without getting a chest xray, so I will have her go to [facility] tomorrow am. We will inform her when this comes back.        Presleigh Feldstein RODRIGUEZ-SOUTHWORTH, PA-C    THE PATIENT IS ENCOURAGED TO PRACTICE SOCIAL DISTANCING DUE TO THE COVID-19 PANDEMIC.

## 2019-05-14 NOTE — Patient Instructions (Addendum)
Check you skin to make sure you dont get rashes and if one sided it could be shingles.    Take the following supplements to help your immune system be stronger to fight this viral infection Take Quarcetin 500 mg three times a day x 7 days with Zinc 50 mg ones a day x 7 days. The quarcetin is an antiviral and anti-inflammatory supplement which helps open the zinc channels in the cell to absorb Zinc. Zinc helps decrease the virus load in your body.  Also make sure to take Vit D 5,000 IU per day with a fatty meal and Vit C 1000 mg a day until you are completely better. Stay on Vitamin D 2,000 the rest of the season. I dont see you have had any Vit D levels done in the labs I checked in Epic. Have those checked the next time you see your family Dr.   We will inform you when your chest xray  And throat culture   Prevent the Spread of COVID-19 if You Are Sick If you are sick with COVID-19 or think you might have COVID-19, follow the steps below to care for yourself and to help protect other people in your home and community. Stay home except to get medical care.  Stay home. Most people with COVID-19 have mild illness and are able to recover at home without medical care. Do not leave your home, except to get medical care. Do not visit public areas.  Take care of yourself. Get rest and stay hydrated. Take over-the-counter medicines, such as acetaminophen, to help you feel better.  Stay in touch with your doctor. Call before you get medical care. Be sure to get care if you have trouble breathing, or have any other emergency warning signs, or if you think it is an emergency.  Avoid public transportation, ride-sharing, or taxis. Separate yourself from other people and pets in your home.  As much as possible, stay in a specific room and away from other people and pets in your home. Also, you should use a separate bathroom, if available. If you need to be around other people or animals in or outside of the  home, wear a mask. ? See COVID-19 and Animals if you have questions about [employer]. ? Additional guidance is available for those living in close quarters. (http://www.turner-rogers.com/.html) and shared housing (TVStereos.ch). Monitor your symptoms.  Symptoms of COVID-19 include fever, cough, and shortness of breath but other symptoms may be present as well.  Follow care instructions from your healthcare provider and local health department. Your local health authorities will give instructions on checking your symptoms and reporting information. When to Seek Emergency Medical Attention Look for emergency warning signs* for COVID-19. If someone is showing any of these signs, seek emergency medical care immediately:  Trouble breathing  Persistent pain or pressure in the chest  New confusion  Bluish lips or face  Inability to wake or stay awake *This list is not all possible symptoms. Please call your medical provider for any other symptoms that are severe or concerning to you. Call 911 or call ahead to your local emergency facility: Notify the operator that you are seeking care for someone who has or may have COVID-19. Call ahead before visiting your doctor.  Call ahead. Many medical visits for routine care are being postponed or done by phone or telemedicine.  If you have a medical appointment that cannot be postponed, call your doctor's office, and tell them you have or may have  COVID-19. If you are sick, wear a mask over your nose and mouth.  You should wear a mask over your nose and mouth if you must be around other people or animals, including pets (even at home).  You don't need to wear the mask if you are alone. If you can't put on a mask (because of trouble breathing for example), cover your coughs  and sneezes in some other way. Try to stay at least 6 feet away from other people. This will help protect the people around you.  Masks should not be placed on young children under age 30 years, anyone who has trouble breathing, or anyone who is not able to remove the mask without help. Note: During the COVID-19 pandemic, medical grade facemasks are reserved for healthcare workers and some first responders. You may need to make a mask using a scarf or bandana. Cover your coughs and sneezes.  Cover your mouth and nose with a tissue when you cough or sneeze.  Throw used tissues in a lined trash can.  Immediately wash your hands with soap and water for at least 20 seconds. If soap and water are not available, clean your hands with an alcohol-based hand sanitizer that contains at least 60% alcohol. Clean your hands often.  Wash your hands often with soap and water for at least 20 seconds. This is especially important after blowing your nose, coughing, or sneezing; going to the bathroom; and before eating or preparing food.  Use hand sanitizer if soap and water are not available. Use an alcohol-based hand sanitizer with at least 60% alcohol, covering all surfaces of your hands and rubbing them together until they feel dry.  Soap and water are the best option, especially if your hands are visibly dirty.  Avoid touching your eyes, nose, and mouth with unwashed hands. Avoid sharing personal household items.  Do not share dishes, drinking glasses, cups, eating utensils, towels, or bedding with other people in your home.  Wash these items thoroughly after using them with soap and water or put them in the dishwasher. Clean all "high-touch" surfaces everyday.  Clean and disinfect high-touch surfaces in your "sick room" and bathroom. Let someone else clean and disinfect surfaces in common areas, but not your bedroom and bathroom.  If a caregiver or other person needs to clean and disinfect a sick  person's bedroom or bathroom, they should do so on an as-needed basis. The caregiver/other person should wear a mask and wait as long as possible after the sick person has used the bathroom. High-touch surfaces include phones, remote controls, counters, tabletops, doorknobs, bathroom fixtures, toilets, keyboards, tablets, and bedside tables.  Clean and disinfect areas that may have blood, stool, or body fluids on them.  Use household cleaners and disinfectants. Clean the area or item with soap and water or another detergent if it is dirty. Then use a household disinfectant. ? Be sure to follow the instructions on the label to ensure safe and effective use of the product. Many products recommend keeping the surface wet for several minutes to ensure germs are killed. Many also recommend precautions such as wearing gloves and making sure you have good ventilation during use of the product. ? Most EPA-registered household disinfectants should be effective. When you can be around others after you had or likely had COVID-19 When you can be around others (end home isolation) depends on different factors for different situations.  I think or know I had COVID-19, and I had symptoms ?  You can be with others after  24 hours with no fever AND  Symptoms improved AND  10 days since symptoms first appeared ? Depending on your healthcare provider's advice and availability of testing, you might get tested to see if you still have COVID-19. If you will be tested, you can be around others when you have no fever, symptoms have improved, and you receive two negative test results in a row, at least 24 hours apart.  I tested positive for COVID-19 but had no symptoms ? If you continue to have no symptoms, you can be with others after:  10 days have passed since test ? Depending on your healthcare provider's advice and availability of testing, you might get tested to see if you still have COVID-19. If you will be  tested, you can be around others after you receive two negative test results in a row, at least 24 hours apart. ? If you develop symptoms after testing positive, follow the guidance above for "I think or know I had COVID, and I had symptoms." [employer] 11/10/2018 This information is not intended to replace advice given to you by your health care provider. Make sure you discuss any questions you have with your health care provider. Document Revised: 11/26/2018 Document Reviewed: 09/29/2018 Elsevier Patient Education  [employer] is back.

## 2019-05-16 LAB — NOVEL CORONAVIRUS, NAA: SARS-CoV-2, NAA: NOT DETECTED

## 2019-05-27 ENCOUNTER — Telehealth: Payer: Self-pay

## 2019-05-27 NOTE — Telephone Encounter (Signed)
See my chart message

## 2019-07-30 ENCOUNTER — Other Ambulatory Visit: Payer: Self-pay

## 2019-07-30 DIAGNOSIS — E785 Hyperlipidemia, unspecified: Secondary | ICD-10-CM

## 2019-07-30 DIAGNOSIS — I10 Essential (primary) hypertension: Secondary | ICD-10-CM

## 2019-07-30 MED ORDER — ROSUVASTATIN CALCIUM 10 MG PO TABS: 10.0000 mg | ORAL_TABLET | Freq: Every day | ORAL | 2 refills | Status: DC

## 2019-07-30 MED ORDER — NIFEDIPINE ER OSMOTIC RELEASE 60 MG PO TB24: 60.0000 mg | ORAL_TABLET | Freq: Every day | ORAL | 1 refills | Status: DC

## 2019-08-02 ENCOUNTER — Telehealth: Payer: Self-pay | Admitting: Allergy

## 2019-08-02 NOTE — Telephone Encounter (Signed)
Please call patient. See if they can come in person as last 2 visits were telemedicine visit as well.  Thank you.

## 2019-08-02 NOTE — Telephone Encounter (Signed)
Left voicemail for patient to call the office back.   

## 2019-08-04 NOTE — Telephone Encounter (Signed)
2nd Attempt: Left voicemail for patient to call the office back.

## 2019-08-09 ENCOUNTER — Encounter: Payer: Self-pay | Admitting: Allergy

## 2019-08-09 ENCOUNTER — Ambulatory Visit (INDEPENDENT_AMBULATORY_CARE_PROVIDER_SITE_OTHER): Payer: Managed Care, Other (non HMO) | Admitting: Allergy

## 2019-08-09 ENCOUNTER — Other Ambulatory Visit: Payer: Self-pay

## 2019-08-09 DIAGNOSIS — H1013 Acute atopic conjunctivitis, bilateral: Secondary | ICD-10-CM

## 2019-08-09 DIAGNOSIS — J3089 Other allergic rhinitis: Secondary | ICD-10-CM | POA: Diagnosis not present

## 2019-08-09 DIAGNOSIS — T781XXD Other adverse food reactions, not elsewhere classified, subsequent encounter: Secondary | ICD-10-CM | POA: Diagnosis not present

## 2019-08-09 DIAGNOSIS — Z8709 Personal history of other diseases of the respiratory system: Secondary | ICD-10-CM | POA: Diagnosis not present

## 2019-08-09 MED ORDER — CROMOLYN SODIUM 4 % OP SOLN: 1.0000 [drp] | Freq: Four times a day (QID) | OPHTHALMIC | 3 refills | Status: DC | PRN

## 2019-08-09 NOTE — Assessment & Plan Note (Addendum)
Past history - Noticed perioral pruritus and skin pruritus for the past year after consuming fresh grapes, strawberries, mangoes, kiwi, pineapples, peanuts and tree nuts.  Symptoms usually resolve after a day after antihistamines.  Dairy products, eggs, beef, leafy vegetables cause some GI discomfort.  Seafood and shellfish sometimes causes swelling of the feet and even contact with the shrimp can cause discomfort in her hands. 2020 skin testing was negative to foods. 2021 bloodwork - negative to grape, mango, strawberry, tree nuts, peanuts, egg white, soy, wheat, fish, corn, sesame seed. Milk, shrimp, and scallops were borderline positive.  Interim history - avoiding peanuts and tree nuts. Eating small amounts of the other foods with no issues.   Continue to avoid foods that bother you - peanuts and tree nuts.   Food action plan in place.  For mild symptoms you can take over the counter antihistamines such as Benadryl and monitor symptoms closely. If symptoms worsen or if you have severe symptoms including breathing issues, throat closure, significant swelling, whole body hives, severe diarrhea and vomiting, lightheadedness then inject epinephrine and seek immediate medical care afterwards.  Follow up with PCP regarding your GI issues.

## 2019-08-09 NOTE — Progress Notes (Signed)
RE: Kathleen Jordan MRN: [id number] DOB: [birth year] Date of Telemedicine Visit: 08/09/2019  Referring provider: [name], * Primary care provider: Zola Button, Grayling Congress, DO  Chief Complaint: Allergic Rhinitis  (Televisit at home. Patient gave verbal consent to treat and bill insurance for this visit.) and Allergic Reaction   Telemedicine Follow Up Visit via Telephone: I connected with Kathleen Jordan for a follow up on 08/09/19 by telephone and verified that I am speaking with the correct person using two identifiers.   I discussed the limitations, risks, security and privacy concerns of performing an evaluation and management service by telephone and the availability of in person appointments. I also discussed with the patient that there may be a patient responsible charge related to this service. The patient expressed understanding and agreed to proceed.  Patient is at home/work. Provider is at the office.  Visit start time: 12:29PM Visit end time: 12:45PM Insurance consent/check in by: front desk Medical consent and medical assistant/nurse: Vonzell Schlatter.  History of Present Illness: She is a 49 y.o. female, who is being followed for allergic rhinoconjunctivitis, adverse food reaction and history of frequent upper respiratory infections. Her previous allergy office visit was on 05/12/2019 with Dr. Selena Batten via telemedicine. Today is a regular follow up visit.   Perennial allergic rhinitis Rhinorrhea, coughing, nausea, runny/itchy eyes lately.  Currently on olopatadine 0.1% eye drops with no benefit. Also taking Flonase and Astelin alternating days which does not seem to work.  Takes zyrtec and benadryl as needed.   Tried montelukast in the past and thinks it made her drowsy.   Not interested in allergy shots yet. Going back to in-person working in July and usually has issues with allergies flaring at the office.   Adverse food reaction Eating small amounts of  fruits, seafood and shellfish with no issues.  Still avoiding peanuts and tree nuts. No reactions.  History of frequent URI No infections since the last visit.  Assessment and Plan: Kathleen Jordan is a 49 y.o. female with: Perennial allergic rhinitis Past history - Perennial rhinoconjunctivitis symptoms for the past 5 years.  Triggers include dust.  Tried over-the-counter antihistamines, Singulair, Flonase, Astelin and eyedrops with minimal benefit. 2020 skin testing showed: Positive to dust mites and cockroaches. Singulair caused drowsiness.  Interim history - increased rhino conjunctivitis symptoms.   Continue environmental control measures  Try Claritin (loratadine) in the morning and take zyrtec (cetirizine) 10mg  in the evening.   Use Flonase 1 spray per nostril twice a day for nasal congestion.  Use Astelin 1-2 sprays per nostril twice a day as needed for runny nose.  Nasal saline spray (i.e., Simply Saline) or nasal saline lavage (i.e., NeilMed) is recommended as needed and prior to medicated nasal sprays.  Try cromolyn 1-2 drops in the eye as needed for itchy/watery eyes. May use up to 4 times a day as needed.   If above regimen not effective, will discuss starting allergy immunotherapy next.    Allergic conjunctivitis of both eyes  See assessment and plan as above for allergic rhinitis.  History of frequent URI Past history - History of frequent sinus infections.  Immune bloodwork - immunoglobulin levels and vaccine titers to pneumococcal and tetanus/diptheria were normal. Interim history - No additional infections.   Keep track of infections.  Adverse food reaction Past history - Noticed perioral pruritus and skin pruritus for the past year after consuming fresh grapes, strawberries, mangoes, kiwi, pineapples, peanuts and tree nuts.  Symptoms usually resolve after  a day after antihistamines.  Dairy products, eggs, beef, leafy vegetables cause some GI discomfort.  Seafood and  shellfish sometimes causes swelling of the feet and even contact with the shrimp can cause discomfort in her hands. 2020 skin testing was negative to foods. 2021 bloodwork - negative to grape, mango, strawberry, tree nuts, peanuts, egg white, soy, wheat, fish, corn, sesame seed. Milk, shrimp, and scallops were borderline positive.  Interim history - avoiding peanuts and tree nuts. Eating small amounts of the other foods with no issues.   Continue to avoid foods that bother you - peanuts and tree nuts.   Food action plan in place.  For mild symptoms you can take over the counter antihistamines such as Benadryl and monitor symptoms closely. If symptoms worsen or if you have severe symptoms including breathing issues, throat closure, significant swelling, whole body hives, severe diarrhea and vomiting, lightheadedness then inject epinephrine and seek immediate medical care afterwards.  Follow up with PCP regarding your GI issues.   Return in about 3 months (around 11/09/2019).  Meds ordered this encounter  Medications  . cromolyn (OPTICROM) 4 % ophthalmic solution    Sig: Place 1-2 drops into the right eye 4 (four) times daily as needed (itchy/watery eyes).    Dispense:  10 mL    Refill:  3   Diagnostics: None.  Medication List:  Current Outpatient Medications  Medication Sig Dispense Refill  . azelastine (ASTELIN) 0.1 % nasal spray Place 2 sprays into both nostrils 2 (two) times daily. Use in each nostril as directed (Patient taking differently: Place 2 sprays into both nostrils daily as needed for allergies. ) 30 mL 2  . cetirizine (ZYRTEC ALLERGY) 10 MG tablet Take 1 tablet (10 mg total) by mouth daily. 30 tablet 11  . diphenhydrAMINE (BENADRYL) 25 MG tablet Take 25 mg by mouth every 6 (six) hours as needed for allergies.    Marland Kitchen empagliflozin (JARDIANCE) 25 MG TABS tablet Take 25 mg by mouth every evening.     Marland Kitchen EPINEPHrine (AUVI-Q) 0.3 mg/0.3 mL IJ SOAJ injection Inject 0.3 mLs (0.3 mg  total) into the muscle as needed for anaphylaxis. 1 each 1  . ezetimibe (ZETIA) 10 MG tablet TAKE 1 TABLET(10 MG) BY MOUTH DAILY 90 tablet 1  . glucose blood (CONTOUR NEXT TEST) test strip Check BG 3 times/day.  Dx E11.65    . ibuprofen (ADVIL) 200 MG tablet Take 400 mg by mouth every 6 (six) hours as needed for headache or moderate pain.    . Insulin Glargine (BASAGLAR KWIKPEN) 100 UNIT/ML SOPN Inject 65 Units into the skin 2 (two) times daily.     Marland Kitchen levonorgestrel (MIRENA, 52 MG,) 20 MCG/24HR IUD Mirena 20 mcg/24 hours (6 yrs) 52 mg intrauterine device  Take 1 device by intrauterine route.    . naphazoline-pheniramine (VISINE) 0.025-0.3 % ophthalmic solution Place 1 drop into both eyes 4 (four) times daily as needed for eye irritation or allergies.    Marland Kitchen NIFEdipine (PROCARDIA XL/NIFEDICAL XL) 60 MG 24 hr tablet Take 1 tablet (60 mg total) by mouth daily. 90 tablet 1  . rosuvastatin (CRESTOR) 10 MG tablet Take 1 tablet (10 mg total) by mouth at bedtime. 30 tablet 2  . Semaglutide,0.25 or 0.5MG /DOS, (OZEMPIC, 0.25 OR 0.5 MG/DOSE,) 2 MG/1.5ML SOPN Ozempic 0.25 mg or 0.5 mg (2 mg/1.5 mL) subcutaneous pen injector    . senna (SENOKOT) 8.6 MG tablet Take by mouth daily as needed.     . cromolyn (OPTICROM) 4 %  ophthalmic solution Place 1-2 drops into the right eye 4 (four) times daily as needed (itchy/watery eyes). 10 mL 3   No current facility-administered medications for this visit.   Allergies: No Known Allergies I reviewed her past medical history, social history, family history, and environmental history and no significant changes have been reported from her previous visit.  Review of Systems  Constitutional: Negative for appetite change, chills, fever and unexpected weight change.  HENT: Positive for congestion and rhinorrhea.   Eyes: Positive for itching.  Respiratory: Positive for cough. Negative for chest tightness, shortness of breath and wheezing.   Cardiovascular: Negative for chest  pain.  Gastrointestinal: Positive for nausea. Negative for abdominal pain.  Genitourinary: Negative for difficulty urinating.  Skin: Negative for rash.  Allergic/Immunologic: Positive for environmental allergies and food allergies.  Neurological: Negative for headaches.   Objective: Physical Exam Not obtained as encounter was done via telephone.   Previous notes and tests were reviewed.  I discussed the assessment and treatment plan with the patient. The patient was provided an opportunity to ask questions and all were answered. The patient agreed with the plan and demonstrated an understanding of the instructions. After visit summary/patient instructions available via mychart.   The patient was advised to call back or seek an in-person evaluation if the symptoms worsen or if the condition fails to improve as anticipated.  I provided 15 minutes of non-face-to-face time during this encounter.  It was my pleasure to participate in Kathleen Jordan's care today. Please feel free to contact me with any questions or concerns.   Sincerely,  Rexene Alberts, DO Allergy & Immunology  [facility] office: [phone] [facility] office: [place] office: [phone]

## 2019-08-09 NOTE — Assessment & Plan Note (Signed)
   See assessment and plan as above for allergic rhinitis.  

## 2019-08-09 NOTE — Patient Instructions (Addendum)
Perennial allergic rhinitis 2020 skin testing showed: Positive to dust mites and cockroaches.   Continue environmental control measures  Try Claritin (loratadine) in the morning and take zyrtec (cetirizine) 10mg  in the evening.   Use Flonase 1 spray per nostril twice a day for nasal congestion.  Use Astelin 1-2 sprays per nostril twice a day as needed for runny nose.  Nasal saline spray (i.e., Simply Saline) or nasal saline lavage (i.e., NeilMed) is recommended as needed and prior to medicated nasal sprays.  Try cromolyn 1-2 drops in the eye as needed for itchy/watery eyes. May use up to 4 times a day as needed.   Adverse food reaction  Continue to avoid foods that bother you - peanuts and tree nuts.   Food action plan in place.  For mild symptoms you can take over the counter antihistamines such as Benadryl and monitor symptoms closely. If symptoms worsen or if you have severe symptoms including breathing issues, throat closure, significant swelling, whole body hives, severe diarrhea and vomiting, lightheadedness then inject epinephrine and seek immediate medical care afterwards.  Follow up with PCP regarding your GI issues.   Frequent sinus infections  Keep track of infections.  Follow up in 3 months or sooner if needed.

## 2019-08-09 NOTE — Assessment & Plan Note (Signed)
Past history - History of frequent sinus infections.  Immune bloodwork - immunoglobulin levels and vaccine titers to pneumococcal and tetanus/diptheria were normal. Interim history - No additional infections.   Keep track of infections.

## 2019-08-09 NOTE — Assessment & Plan Note (Addendum)
Past history - Perennial rhinoconjunctivitis symptoms for the past 5 years.  Triggers include dust.  Tried over-the-counter antihistamines, Singulair, Flonase, Astelin and eyedrops with minimal benefit. 2020 skin testing showed: Positive to dust mites and cockroaches. Singulair caused drowsiness.  Interim history - increased rhino conjunctivitis symptoms.   Continue environmental control measures  Try Claritin (loratadine) in the morning and take zyrtec (cetirizine) 10mg  in the evening.   Use Flonase 1 spray per nostril twice a day for nasal congestion.  Use Astelin 1-2 sprays per nostril twice a day as needed for runny nose.  Nasal saline spray (i.e., Simply Saline) or nasal saline lavage (i.e., NeilMed) is recommended as needed and prior to medicated nasal sprays.  Try cromolyn 1-2 drops in the eye as needed for itchy/watery eyes. May use up to 4 times a day as needed.   If above regimen not effective, will discuss starting allergy immunotherapy next.

## 2019-08-16 ENCOUNTER — Ambulatory Visit (INDEPENDENT_AMBULATORY_CARE_PROVIDER_SITE_OTHER): Payer: Managed Care, Other (non HMO) | Admitting: Family Medicine

## 2019-08-16 ENCOUNTER — Ambulatory Visit: Payer: Managed Care, Other (non HMO)

## 2019-08-16 ENCOUNTER — Ambulatory Visit (INDEPENDENT_AMBULATORY_CARE_PROVIDER_SITE_OTHER): Payer: Managed Care, Other (non HMO)

## 2019-08-16 ENCOUNTER — Encounter: Payer: Self-pay | Admitting: Family Medicine

## 2019-08-16 ENCOUNTER — Other Ambulatory Visit: Payer: Self-pay

## 2019-08-16 VITALS — BP 120/86 | HR 81 | Temp 97.5°F | Resp 18 | Ht 68.5 in | Wt 245.0 lb

## 2019-08-16 DIAGNOSIS — I10 Essential (primary) hypertension: Secondary | ICD-10-CM

## 2019-08-16 DIAGNOSIS — E785 Hyperlipidemia, unspecified: Secondary | ICD-10-CM

## 2019-08-16 DIAGNOSIS — M791 Myalgia, unspecified site: Secondary | ICD-10-CM

## 2019-08-16 DIAGNOSIS — R1013 Epigastric pain: Secondary | ICD-10-CM

## 2019-08-16 DIAGNOSIS — M79662 Pain in left lower leg: Secondary | ICD-10-CM

## 2019-08-16 DIAGNOSIS — E1169 Type 2 diabetes mellitus with other specified complication: Secondary | ICD-10-CM | POA: Diagnosis not present

## 2019-08-16 DIAGNOSIS — R1011 Right upper quadrant pain: Secondary | ICD-10-CM | POA: Diagnosis not present

## 2019-08-16 DIAGNOSIS — R0789 Other chest pain: Secondary | ICD-10-CM | POA: Diagnosis not present

## 2019-08-16 LAB — CBC WITH DIFFERENTIAL/PLATELET
Basophils Absolute: 0 10*3/uL (ref 0.0–0.1)
Basophils Relative: 0.4 % (ref 0.0–3.0)
Eosinophils Absolute: 0.3 10*3/uL (ref 0.0–0.7)
Eosinophils Relative: 3.2 % (ref 0.0–5.0)
HCT: 42.3 % (ref 36.0–46.0)
Hemoglobin: 14.3 g/dL (ref 12.0–15.0)
Lymphocytes Relative: 35.3 % (ref 12.0–46.0)
Lymphs Abs: 2.9 10*3/uL (ref 0.7–4.0)
MCHC: 33.7 g/dL (ref 30.0–36.0)
MCV: 89.2 fl (ref 78.0–100.0)
Monocytes Absolute: 0.5 10*3/uL (ref 0.1–1.0)
Monocytes Relative: 6.5 % (ref 3.0–12.0)
Neutro Abs: 4.4 10*3/uL (ref 1.4–7.7)
Neutrophils Relative %: 54.6 % (ref 43.0–77.0)
Platelets: 264 10*3/uL (ref 150.0–400.0)
RBC: 4.74 Mil/uL (ref 3.87–5.11)
RDW: 13 % (ref 11.5–15.5)
WBC: 8.1 10*3/uL (ref 4.0–10.5)

## 2019-08-16 LAB — LIPID PANEL
Cholesterol: 111 mg/dL (ref 0–200)
HDL: 33.7 mg/dL — ABNORMAL LOW (ref >39.00)
LDL Cholesterol: 64 mg/dL (ref 0–99)
NonHDL: 76.81
Total CHOL/HDL Ratio: 3
Triglycerides: 65 mg/dL (ref 0.0–149.0)
VLDL: 13 mg/dL (ref 0.0–40.0)

## 2019-08-16 LAB — COMPREHENSIVE METABOLIC PANEL
ALT: 35 U/L (ref 0–35)
AST: 31 U/L (ref 0–37)
Albumin: 4.3 g/dL (ref 3.5–5.2)
Alkaline Phosphatase: 72 U/L (ref 39–117)
BUN: 12 mg/dL (ref 6–23)
CO2: 27 mEq/L (ref 19–32)
Calcium: 9.2 mg/dL (ref 8.4–10.5)
Chloride: 102 mEq/L (ref 96–112)
Creatinine, Ser: 0.83 mg/dL (ref 0.40–1.20)
GFR: 89.3 mL/min (ref >60.00)
Glucose, Bld: 181 mg/dL — ABNORMAL HIGH (ref 70–99)
Potassium: 3.3 mEq/L — ABNORMAL LOW (ref 3.5–5.1)
Sodium: 136 mEq/L (ref 135–145)
Total Bilirubin: 0.3 mg/dL (ref 0.2–1.2)
Total Protein: 7.3 g/dL (ref 6.0–8.3)

## 2019-08-16 LAB — D-DIMER, QUANTITATIVE: D-Dimer, Quant: 0.25 mcg/mL FEU (ref <0.50)

## 2019-08-16 LAB — AMYLASE: Amylase: 28 U/L (ref 27–131)

## 2019-08-16 LAB — CK: Total CK: 247 U/L — ABNORMAL HIGH (ref 7–177)

## 2019-08-16 LAB — LIPASE: Lipase: 32 U/L (ref 11.0–59.0)

## 2019-08-16 LAB — TROPONIN I (HIGH SENSITIVITY): High Sens Troponin I: 4 ng/L (ref 2–17)

## 2019-08-16 MED ORDER — NIFEDIPINE ER OSMOTIC RELEASE 60 MG PO TB24: 60.0000 mg | ORAL_TABLET | Freq: Every day | ORAL | 1 refills | Status: DC

## 2019-08-16 NOTE — Progress Notes (Signed)
Patient ID: Kathleen Jordan, female    DOB: 1971-02-11  Age: 49 y.o. MRN: [id number]    Subjective:  Subjective  HPI Kathleen Jordan presents for l calf pain and chest pain.  No sob.   She also c/o upper abd pain and dec appetite--  Her ozempic dose was just increased.    No diarrhea, no constipation  Chest pain goes under L arm ---  It comes and goes --- it has been a while since she had cp.    No sob Calf pain comes and goes   No swelling   Review of Systems  Constitutional: Negative for appetite change, diaphoresis, fatigue and unexpected weight change.  Eyes: Negative for pain, redness and visual disturbance.  Respiratory: Negative for cough, chest tightness, shortness of breath and wheezing.   Cardiovascular: Positive for chest pain. Negative for palpitations and leg swelling.  Gastrointestinal: Positive for abdominal pain and nausea. Negative for abdominal distention, anal bleeding, blood in stool, constipation, diarrhea, rectal pain and vomiting.  Endocrine: Negative for cold intolerance, heat intolerance, polydipsia, polyphagia and polyuria.  Genitourinary: Negative for difficulty urinating, dysuria and frequency.  Neurological: Negative for dizziness, light-headedness, numbness and headaches.    History Past Medical History:  Diagnosis Date  . Allergic rhinitis   . Anal fissure 07/15/2017  . Chronic constipation   . Frequency of urination   . GERD (gastroesophageal reflux disease)   . History of palpitations   . Hx of varicella   . Hyperlipidemia   . Hypertension   . Menorrhagia   . OSA (obstructive sleep apnea)    08-07-2017 no cpap due to finanaces  . PCOS (polycystic ovarian syndrome)   . Peripheral neuropathy   . Thyroid goiter   . Type 2 diabetes mellitus with hyperglycemia, with long-term current use of insulin (HCC)   . Urgency of urination     She has a past surgical history that includes Breast reduction surgery (Bilateral, 07-18-2003    dr Shon Hough  [facility]); invitro fertilization (11/16/2010); Colonoscopy with propofol (N/A, 07/15/2017); Dilatation & currettage/hysteroscopy with hydrothermal ablation (N/A, 03/06/2018); and Cholecystectomy (N/A, 02/15/2019).   Her family history includes Allergic rhinitis in her mother and sister; Allergies in her sister; Arthritis in her maternal grandmother and mother; Asthma in her mother and sister; Cancer in her maternal grandfather and maternal grandmother; Congestive Heart Failure in her mother; Diabetes in her brother, maternal grandmother, and mother; Heart disease (age of onset: 64) in her mother; Hypertension in her brother, maternal grandmother, and mother; Stroke in her maternal grandfather and mother.She reports that she is a non-smoker but has been exposed to tobacco smoke. She has never used smokeless tobacco. She reports that she does not drink alcohol or use drugs.  Current Outpatient Medications on File Prior to Visit  Medication Sig Dispense Refill  . azelastine (ASTELIN) 0.1 % nasal spray Place 2 sprays into both nostrils 2 (two) times daily. Use in each nostril as directed (Patient taking differently: Place 2 sprays into both nostrils daily as needed for allergies. ) 30 mL 2  . cetirizine (ZYRTEC ALLERGY) 10 MG tablet Take 1 tablet (10 mg total) by mouth daily. 30 tablet 11  . cromolyn (OPTICROM) 4 % ophthalmic solution Place 1-2 drops into the right eye 4 (four) times daily as needed (itchy/watery eyes). 10 mL 3  . diphenhydrAMINE (BENADRYL) 25 MG tablet Take 25 mg by mouth every 6 (six) hours as needed for allergies.    Marland Kitchen empagliflozin (JARDIANCE) 25 MG  TABS tablet Take 25 mg by mouth every evening.     Marland Kitchen EPINEPHrine (AUVI-Q) 0.3 mg/0.3 mL IJ SOAJ injection Inject 0.3 mLs (0.3 mg total) into the muscle as needed for anaphylaxis. 1 each 1  . ezetimibe (ZETIA) 10 MG tablet TAKE 1 TABLET(10 MG) BY MOUTH DAILY 90 tablet 1  . glucose blood (CONTOUR NEXT TEST) test strip Check BG 3  times/day.  Dx E11.65    . Insulin Glargine (BASAGLAR KWIKPEN) 100 UNIT/ML SOPN Inject 65 Units into the skin 2 (two) times daily.     Marland Kitchen levonorgestrel (MIRENA, 52 MG,) 20 MCG/24HR IUD Mirena 20 mcg/24 hours (6 yrs) 52 mg intrauterine device  Take 1 device by intrauterine route.    . naphazoline-pheniramine (VISINE) 0.025-0.3 % ophthalmic solution Place 1 drop into both eyes 4 (four) times daily as needed for eye irritation or allergies.    . rosuvastatin (CRESTOR) 10 MG tablet Take 1 tablet (10 mg total) by mouth at bedtime. 30 tablet 2  . Semaglutide,0.25 or 0.5MG /DOS, (OZEMPIC, 0.25 OR 0.5 MG/DOSE,) 2 MG/1.5ML SOPN Ozempic 0.25 mg or 0.5 mg (2 mg/1.5 mL) subcutaneous pen injector    . senna (SENOKOT) 8.6 MG tablet Take by mouth daily as needed.     Marland Kitchen ibuprofen (ADVIL) 200 MG tablet Take 400 mg by mouth every 6 (six) hours as needed for headache or moderate pain.     No current facility-administered medications on file prior to visit.     Objective:  Objective  Physical Exam Vitals and nursing note reviewed.  Constitutional:      Appearance: She is well-developed.  HENT:     Head: Normocephalic and atraumatic.  Eyes:     Conjunctiva/sclera: Conjunctivae normal.  Neck:     Thyroid: No thyromegaly.     Vascular: No carotid bruit or JVD.  Cardiovascular:     Rate and Rhythm: Normal rate and regular rhythm.     Heart sounds: Normal heart sounds. No murmur.  Pulmonary:     Effort: Pulmonary effort is normal. No respiratory distress.     Breath sounds: Normal breath sounds. No wheezing or rales.  Chest:     Chest wall: No tenderness.  Abdominal:     General: There is distension.     Palpations: There is no mass.     Tenderness: There is abdominal tenderness. There is no right CVA tenderness, left CVA tenderness, guarding or rebound.     Hernia: No hernia is present.  Musculoskeletal:     Cervical back: Normal range of motion and neck supple.  Neurological:     Mental Status: She  is alert and oriented to person, place, and time.    BP 120/86 (BP Location: Left Arm, Patient Position: Sitting, Cuff Size: Large)   Pulse 81   Temp (!) 97.5 F (36.4 C) (Temporal)   Resp 18   Ht 5' 8.5" (1.74 m)   Wt 245 lb (111.1 kg)   SpO2 98%   BMI 36.71 kg/m  Wt Readings from Last 3 Encounters:  08/16/19 245 lb (111.1 kg)  05/14/19 250 lb 1.9 oz (113.5 kg)  04/19/19 246 lb (111.6 kg)     Lab Results  Component Value Date   WBC 8.1 08/16/2019   HGB 14.3 08/16/2019   HCT 42.3 08/16/2019   PLT 264.0 08/16/2019   GLUCOSE 181 (H) 08/16/2019   CHOL 111 08/16/2019   TRIG 65.0 08/16/2019   HDL 33.70 (L) 08/16/2019   LDLDIRECT 139.3 11/20/2010  LDLCALC 64 08/16/2019   ALT 35 08/16/2019   AST 31 08/16/2019   NA 136 08/16/2019   K 3.3 (L) 08/16/2019   CL 102 08/16/2019   CREATININE 0.83 08/16/2019   BUN 12 08/16/2019   CO2 27 08/16/2019   TSH 1.42 09/07/2015   HGBA1C 11.1 (H) 02/10/2019   MICROALBUR 4.0 (H) 06/12/2018    No results found.   Assessment & Plan:  Plan  I am having Kathleen Jordan maintain her [employer], empagliflozin, azelastine, EPINEPHrine, ibuprofen, diphenhydrAMINE, Ozempic (0.25 or 0.5 MG/DOSE), Mirena (52 MG), ezetimibe, Contour Next Test, senna, Visine, cetirizine, rosuvastatin, cromolyn, and NIFEdipine.  Meds ordered this encounter  Medications  . NIFEdipine (PROCARDIA XL/NIFEDICAL XL) 60 MG 24 hr tablet    Sig: Take 1 tablet (60 mg total) by mouth daily.    Dispense:  90 tablet    Refill:  1    Problem List Items Addressed This Visit      Unprioritized   Epigastric pain - Primary   Relevant Orders   Comprehensive metabolic panel (Completed)   CBC with Differential/Platelet (Completed)   Amylase (Completed)   Lipase (Completed)   US Abdomen Complete   Troponin I (High Sensitivity) (Completed)   Essential hypertension    Well controlled, no changes to meds. Encouraged heart healthy diet such as the DASH diet and  exercise as tolerated.       Relevant Medications   NIFEdipine (PROCARDIA XL/NIFEDICAL XL) 60 MG 24 hr tablet   Hyperlipidemia associated with type 2 diabetes mellitus ([place])    Tolerating statin, encouraged heart healthy diet, avoid trans fats, minimize simple carbs and saturated fats. Increase exercise as tolerated      Relevant Orders   Lipid panel (Completed)   Other chest pain   Relevant Orders   EKG 12-Lead (Completed)   D-Dimer, Quantitative (Completed)   Troponin I (High Sensitivity) (Completed)   Pain of left calf    R/o dvt Check [place]       Relevant Orders   US Venous Img Lower Unilateral Left (DVT)   RUQ pain   Relevant Orders   US Abdomen Complete    Other Visit Diagnoses    Myalgia       Relevant Orders   CK (Creatine Kinase) (Completed)    ekg-- normal If chest pain returns go to ER Check labs and [place]  Follow-up: Return in about 6 months (around 02/16/2020), or if symptoms worsen or fail to improve, for hypertension, hyperlipidemia, fasting, annual exam.  Ann Held, DO

## 2019-08-16 NOTE — Assessment & Plan Note (Signed)
Well controlled, no changes to meds. Encouraged heart healthy diet such as the DASH diet and exercise as tolerated.  °

## 2019-08-16 NOTE — Patient Instructions (Signed)

## 2019-08-16 NOTE — Assessment & Plan Note (Signed)
Tolerating statin, encouraged heart healthy diet, avoid trans fats, minimize simple carbs and saturated fats. Increase exercise as tolerated 

## 2019-08-16 NOTE — Assessment & Plan Note (Signed)
R/o dvt Check [place]

## 2019-11-21 NOTE — Progress Notes (Signed)
Follow Up Note  RE: Kathleen Jordan MRN: [id number] DOB: 29-Dec-1970 Date of Office Visit: 11/22/2019  Referring provider: [name], * Primary care provider: Zola Button, Grayling Congress, DO  Chief Complaint: Allergic Rhinitis   History of Present Illness: I had the pleasure of seeing Kathleen Jordan for a follow up visit at the [facility] on 11/22/2019. She is a 49 y.o. female, who is being followed for allergic rhinoconjunctivitis, history of frequent URI and adverse food reaction. Her previous allergy office visit was on 08/09/2019 with Dr. Selena Batten via telemedicine. Today is a regular follow up visit.Up to date with COVID-19 vaccine: yes  Allergic rhino conjunctivitis Patient is working from home the majority of the times but now going back a few days per month in person.   Having some itchy/watery eyes in the morning then it dries out at the end of day. She has been using cromolyn eye drops in the mornings.   Taking zyrtec with no benefit. Allegra and Claritin does not seem to work. Open to try Singulair again.   Currently using Flonase 2 sprays per nostril once a day with no nosebleeds.  Not needed to use azelastine nasal spray.   Had eyes checked in 2020.   Having ear fullness. No hearing loss.   History of frequent URI No infections since the last visit.  Adverse food reaction Avoiding peanuts and tree nuts. No reactions since the last visit.   Assessment and Plan: Kathleen Jordan is a 49 y.o. female with: Perennial allergic rhinitis Past history - Perennial rhinoconjunctivitis symptoms for the past 5 years.  Triggers include dust.  Tried over-the-counter antihistamines, Singulair, Flonase, Astelin and eyedrops with minimal benefit. 2020 skin testing showed: Positive to dust mites and cockroaches. Singulair caused drowsiness.  Interim history - zyrtec does not seem to work. Wants to try Singulair again.  Continue environmental control  measures  Try Xyzal in the evening. This replaces zyrtec.  Start Singulair (montelukast) 10mg  daily at night. Cautioned that in some children/adults can experience behavioral changes including hyperactivity, agitation, depression, sleep disturbances and suicidal ideations. These side effects are rare, but if you notice them you should notify me and discontinue Singulair (montelukast).  Use Flonase 2 sprays per nostril once a day for nasal congestion.  Use Astelin 1-2 sprays per nostril twice a day as needed for runny nose.  Nasal saline spray (i.e., Simply Saline) or nasal saline lavage (i.e., NeilMed) is recommended as needed and prior to medicated nasal sprays.  If above regimen not effective, will discuss starting allergy immunotherapy next.    Allergic conjunctivitis of both eyes Using cromolyn for itchy/watery eyes in the morning but noticing dry eyes by the end of the day. Had eye exam in 2020 per patient report.  Stop Cromolyn eye drops as they can cause drying.   Use refresh eye drops.  Dysfunction of eustachian tube Noticing some ear fullness without any hearing loss.  Most likely has eustachian tube dysfunction.  If no improvement with above regimen then will recommend ENT referral next.   History of frequent URI Past history - History of frequent sinus infections.  Immune bloodwork - immunoglobulin levels and vaccine titers to pneumococcal and tetanus/diptheria were normal. Interim history - No infections since last visit.  Keep track of infections.  Adverse food reaction Past history - Noticed perioral pruritus and skin pruritus for the past year after consuming fresh grapes, strawberries, mangoes, kiwi, pineapples, peanuts and tree nuts.  Symptoms usually resolve after a day after antihistamines.  Dairy products, eggs, beef, leafy vegetables cause some GI discomfort.  Seafood and shellfish sometimes causes swelling of the feet and even contact with the shrimp can cause  discomfort in her hands. 2020 skin testing was negative to foods. 2021 bloodwork - negative to grape, mango, strawberry, tree nuts, peanuts, egg white, soy, wheat, fish, corn, sesame seed. Milk, shrimp, and scallops were borderline positive.  Interim history - avoiding peanuts and tree nuts and no reactions.   Continue to avoid foods that bother you - peanuts and tree nuts.   Food action plan in place.  For mild symptoms you can take over the counter antihistamines such as Benadryl and monitor symptoms closely. If symptoms worsen or if you have severe symptoms including breathing issues, throat closure, significant swelling, whole body hives, severe diarrhea and vomiting, lightheadedness then inject epinephrine and seek immediate medical care afterwards.  Return in about 6 months (around 05/24/2020).  Meds ordered this encounter  Medications  . montelukast (SINGULAIR) 10 MG tablet    Sig: Take 1 tablet (10 mg total) by mouth at bedtime.    Dispense:  30 tablet    Refill:  5   Diagnostics: None.  Medication List:  Current Outpatient Medications  Medication Sig Dispense Refill  . cetirizine (ZYRTEC ALLERGY) 10 MG tablet Take 1 tablet (10 mg total) by mouth daily. 30 tablet 11  . diphenhydrAMINE (BENADRYL) 25 MG tablet Take 25 mg by mouth every 6 (six) hours as needed for allergies.    Marland Kitchen empagliflozin (JARDIANCE) 25 MG TABS tablet Take 25 mg by mouth every evening.     Marland Kitchen EPINEPHrine (AUVI-Q) 0.3 mg/0.3 mL IJ SOAJ injection Inject 0.3 mLs (0.3 mg total) into the muscle as needed for anaphylaxis. 1 each 1  . ezetimibe (ZETIA) 10 MG tablet TAKE 1 TABLET(10 MG) BY MOUTH DAILY 90 tablet 1  . glucose blood (CONTOUR NEXT TEST) test strip Check BG 3 times/day.  Dx E11.65    . Insulin Glargine (BASAGLAR KWIKPEN) 100 UNIT/ML SOPN Inject 65 Units into the skin 2 (two) times daily.     Marland Kitchen levonorgestrel (MIRENA, 52 MG,) 20 MCG/24HR IUD Mirena 20 mcg/24 hours (6 yrs) 52 mg intrauterine device  Take 1  device by intrauterine route.    . naphazoline-pheniramine (VISINE) 0.025-0.3 % ophthalmic solution Place 1 drop into both eyes 4 (four) times daily as needed for eye irritation or allergies.    Marland Kitchen NIFEdipine (PROCARDIA XL/NIFEDICAL XL) 60 MG 24 hr tablet Take 1 tablet (60 mg total) by mouth daily. 90 tablet 1  . rosuvastatin (CRESTOR) 10 MG tablet Take 1 tablet (10 mg total) by mouth at bedtime. 30 tablet 2  . Semaglutide,0.25 or 0.5MG /DOS, (OZEMPIC, 0.25 OR 0.5 MG/DOSE,) 2 MG/1.5ML SOPN Ozempic 0.25 mg or 0.5 mg (2 mg/1.5 mL) subcutaneous pen injector    . azelastine (ASTELIN) 0.1 % nasal spray Place 2 sprays into both nostrils 2 (two) times daily. Use in each nostril as directed (Patient taking differently: Place 2 sprays into both nostrils daily as needed for allergies. ) 30 mL 2  . montelukast (SINGULAIR) 10 MG tablet Take 1 tablet (10 mg total) by mouth at bedtime. 30 tablet 5  . senna (SENOKOT) 8.6 MG tablet Take by mouth daily as needed.  (Patient not taking: Reported on 11/22/2019)     No current facility-administered medications for this visit.   Allergies: No Known Allergies I reviewed her past medical history, social history,  family history, and environmental history and no significant changes have been reported from her previous visit.  Review of Systems  Constitutional: Negative for appetite change, chills, fever and unexpected weight change.  HENT: Positive for congestion. Negative for rhinorrhea.   Eyes: Positive for itching.  Respiratory: Negative for cough, chest tightness, shortness of breath and wheezing.   Cardiovascular: Negative for chest pain.  Gastrointestinal: Negative for abdominal pain.  Genitourinary: Negative for difficulty urinating.  Skin: Negative for rash.  Allergic/Immunologic: Positive for environmental allergies and food allergies.  Neurological: Negative for headaches.   Objective: BP 132/80   Pulse 74   Temp 97.9 F (36.6 C) (Temporal)   Resp 18    Wt 242 lb 9.6 oz (110 kg)   SpO2 96%   BMI 36.35 kg/m  Body mass index is 36.35 kg/m. Physical Exam Vitals and nursing note reviewed.  Constitutional:      Appearance: Normal appearance. She is well-developed.  HENT:     Head: Normocephalic and atraumatic.     Right Ear: Tympanic membrane and external ear normal.     Left Ear: Tympanic membrane and external ear normal.     Nose: Nose normal.     Mouth/Throat:     Mouth: Mucous membranes are moist.     Pharynx: Oropharynx is clear.  Eyes:     Conjunctiva/sclera: Conjunctivae normal.  Cardiovascular:     Rate and Rhythm: Normal rate and regular rhythm.     Heart sounds: Normal heart sounds. No murmur heard.  No friction rub. No gallop.   Pulmonary:     Effort: Pulmonary effort is normal.     Breath sounds: Normal breath sounds. No wheezing, rhonchi or rales.  Musculoskeletal:     Cervical back: Neck supple.  Skin:    General: Skin is warm.     Findings: No rash.  Neurological:     Mental Status: She is alert and oriented to person, place, and time.  Psychiatric:        Mood and Affect: Mood normal.        Behavior: Behavior normal.    Previous notes and tests were reviewed. The plan was reviewed with the patient/family, and all questions/concerned were addressed.  It was my pleasure to see Kathleen Jordan today and participate in her care. Please feel free to contact me with any questions or concerns.  Sincerely,  Wyline Mood, DO Allergy & Immunology  [facility] office: [phone] [facility] office: [phone] [place] office: [phone]

## 2019-11-22 ENCOUNTER — Ambulatory Visit (INDEPENDENT_AMBULATORY_CARE_PROVIDER_SITE_OTHER): Payer: Managed Care, Other (non HMO) | Admitting: Allergy

## 2019-11-22 ENCOUNTER — Encounter: Payer: Self-pay | Admitting: Allergy

## 2019-11-22 ENCOUNTER — Other Ambulatory Visit: Payer: Self-pay

## 2019-11-22 VITALS — BP 132/80 | HR 74 | Temp 97.9°F | Resp 18 | Wt 242.6 lb

## 2019-11-22 DIAGNOSIS — H1013 Acute atopic conjunctivitis, bilateral: Secondary | ICD-10-CM

## 2019-11-22 DIAGNOSIS — T781XXD Other adverse food reactions, not elsewhere classified, subsequent encounter: Secondary | ICD-10-CM

## 2019-11-22 DIAGNOSIS — Z8709 Personal history of other diseases of the respiratory system: Secondary | ICD-10-CM

## 2019-11-22 DIAGNOSIS — H6980 Other specified disorders of Eustachian tube, unspecified ear: Secondary | ICD-10-CM

## 2019-11-22 DIAGNOSIS — J3089 Other allergic rhinitis: Secondary | ICD-10-CM

## 2019-11-22 MED ORDER — MONTELUKAST SODIUM 10 MG PO TABS: 10.0000 mg | ORAL_TABLET | Freq: Every day | ORAL | 5 refills | Status: DC

## 2019-11-22 NOTE — Assessment & Plan Note (Signed)
Past history - Noticed perioral pruritus and skin pruritus for the past year after consuming fresh grapes, strawberries, mangoes, kiwi, pineapples, peanuts and tree nuts.  Symptoms usually resolve after a day after antihistamines.  Dairy products, eggs, beef, leafy vegetables cause some GI discomfort.  Seafood and shellfish sometimes causes swelling of the feet and even contact with the shrimp can cause discomfort in her hands. 2020 skin testing was negative to foods. 2021 bloodwork - negative to grape, mango, strawberry, tree nuts, peanuts, egg white, soy, wheat, fish, corn, sesame seed. Milk, shrimp, and scallops were borderline positive.  Interim history - avoiding peanuts and tree nuts and no reactions.   Continue to avoid foods that bother you - peanuts and tree nuts.   Food action plan in place.  For mild symptoms you can take over the counter antihistamines such as Benadryl and monitor symptoms closely. If symptoms worsen or if you have severe symptoms including breathing issues, throat closure, significant swelling, whole body hives, severe diarrhea and vomiting, lightheadedness then inject epinephrine and seek immediate medical care afterwards.

## 2019-11-22 NOTE — Patient Instructions (Addendum)
Allergic rhino conjunctivitis 2020 skin testing showed: Positive to dust mites and cockroaches.   Continue environmental control measures  Try Xyzal in the evening. This replaces zyrtec.  Start Singulair (montelukast) 10mg  daily at night. Cautioned that in some children/adults can experience behavioral changes including hyperactivity, agitation, depression, sleep disturbances and suicidal ideations. These side effects are rare, but if you notice them you should notify me and discontinue Singulair (montelukast).  Use Flonase 2 sprays per nostril once a day for nasal congestion.  Use Astelin 1-2 sprays per nostril twice a day as needed for runny nose.  Nasal saline spray (i.e., Simply Saline) or nasal saline lavage (i.e., NeilMed) is recommended as needed and prior to medicated nasal sprays.  Stop Cromolyn eye drops for now.  Use refresh eye drops.  Eustachian tube dysufnction  If your ear fullness does not improve then will refer to ENT next.  Adverse food reaction  Continue to avoid foods that bother you - peanuts and tree nuts.   Food action plan in place.  For mild symptoms you can take over the counter antihistamines such as Benadryl and monitor symptoms closely. If symptoms worsen or if you have severe symptoms including breathing issues, throat closure, significant swelling, whole body hives, severe diarrhea and vomiting, lightheadedness then inject epinephrine and seek immediate medical care afterwards.  Frequent sinus infections  Keep track of infections.  Follow up in 6 months or sooner if needed.

## 2019-11-22 NOTE — Assessment & Plan Note (Signed)
Past history - Perennial rhinoconjunctivitis symptoms for the past 5 years.  Triggers include dust.  Tried over-the-counter antihistamines, Singulair, Flonase, Astelin and eyedrops with minimal benefit. 2020 skin testing showed: Positive to dust mites and cockroaches. Singulair caused drowsiness.  Interim history - zyrtec does not seem to work. Wants to try Singulair again.  Continue environmental control measures  Try Xyzal in the evening. This replaces zyrtec.  Start Singulair (montelukast) 10mg  daily at night. Cautioned that in some children/adults can experience behavioral changes including hyperactivity, agitation, depression, sleep disturbances and suicidal ideations. These side effects are rare, but if you notice them you should notify me and discontinue Singulair (montelukast).  Use Flonase 2 sprays per nostril once a day for nasal congestion.  Use Astelin 1-2 sprays per nostril twice a day as needed for runny nose.  Nasal saline spray (i.e., Simply Saline) or nasal saline lavage (i.e., NeilMed) is recommended as needed and prior to medicated nasal sprays.  If above regimen not effective, will discuss starting allergy immunotherapy next.

## 2019-11-22 NOTE — Assessment & Plan Note (Signed)
Past history - History of frequent sinus infections.  Immune bloodwork - immunoglobulin levels and vaccine titers to pneumococcal and tetanus/diptheria were normal. Interim history - No infections since last visit.  Keep track of infections.

## 2019-11-22 NOTE — Assessment & Plan Note (Signed)
Noticing some ear fullness without any hearing loss.  Most likely has eustachian tube dysfunction.  If no improvement with above regimen then will recommend ENT referral next.

## 2019-11-22 NOTE — Assessment & Plan Note (Signed)
Using cromolyn for itchy/watery eyes in the morning but noticing dry eyes by the end of the day. Had eye exam in 2020 per patient report.  Stop Cromolyn eye drops as they can cause drying.   Use refresh eye drops.

## 2019-11-26 ENCOUNTER — Encounter: Payer: Self-pay | Admitting: Family Medicine

## 2019-11-26 ENCOUNTER — Other Ambulatory Visit: Payer: Self-pay

## 2019-11-26 ENCOUNTER — Ambulatory Visit: Payer: Managed Care, Other (non HMO) | Admitting: Family Medicine

## 2019-11-26 VITALS — BP 110/78 | HR 86 | Temp 98.2°F | Resp 18 | Ht 68.5 in | Wt 242.2 lb

## 2019-11-26 DIAGNOSIS — E785 Hyperlipidemia, unspecified: Secondary | ICD-10-CM | POA: Diagnosis not present

## 2019-11-26 DIAGNOSIS — L219 Seborrheic dermatitis, unspecified: Secondary | ICD-10-CM | POA: Diagnosis not present

## 2019-11-26 DIAGNOSIS — Z8742 Personal history of other diseases of the female genital tract: Secondary | ICD-10-CM

## 2019-11-26 DIAGNOSIS — L658 Other specified nonscarring hair loss: Secondary | ICD-10-CM | POA: Diagnosis not present

## 2019-11-26 LAB — FERRITIN: Ferritin: 170 ng/mL (ref 16–232)

## 2019-11-26 MED ORDER — SPIRONOLACTONE 25 MG PO TABS: 25.0000 mg | ORAL_TABLET | Freq: Every day | ORAL | 1 refills | Status: DC

## 2019-11-26 MED ORDER — CICLOPIROX 1 % EX SHAM: MEDICATED_SHAMPOO | CUTANEOUS | 0 refills | Status: DC

## 2019-11-26 MED ORDER — EZETIMIBE 10 MG PO TABS: ORAL_TABLET | ORAL | 1 refills | Status: DC

## 2019-11-26 NOTE — Progress Notes (Signed)
Patient ID: Kathleen Jordan, female    DOB: [birth year]  Age: 49 y.o. MRN: [id number]    Subjective:  Subjective  HPI Kathleen Jordan presents for itchy scalp x months ---and a few days ago she noticed a bald spot   She has been using scalp oil with no relief  Review of Systems  Constitutional: Negative for appetite change, diaphoresis, fatigue and unexpected weight change.  Eyes: Negative for pain, redness and visual disturbance.  Respiratory: Negative for cough, chest tightness, shortness of breath and wheezing.   Cardiovascular: Negative for chest pain, palpitations and leg swelling.  Endocrine: Negative for cold intolerance, heat intolerance, polydipsia, polyphagia and polyuria.  Genitourinary: Negative for difficulty urinating, dysuria and frequency.  Neurological: Negative for dizziness, light-headedness, numbness and headaches.    History Past Medical History:  Diagnosis Date  . Allergic rhinitis   . Anal fissure 07/15/2017  . Chronic constipation   . Frequency of urination   . GERD (gastroesophageal reflux disease)   . History of palpitations   . Hx of varicella   . Hyperlipidemia   . Hypertension   . Menorrhagia   . OSA (obstructive sleep apnea)    08-07-2017 no cpap due to finanaces  . PCOS (polycystic ovarian syndrome)   . Peripheral neuropathy   . Thyroid goiter   . Type 2 diabetes mellitus with hyperglycemia, with long-term current use of insulin (HCC)   . Urgency of urination     She has a past surgical history that includes Breast reduction surgery (Bilateral, 07-18-2003   dr Shon Hough  [facility]); invitro fertilization (11/16/2010); Colonoscopy with propofol (N/A, 07/15/2017); Dilatation & currettage/hysteroscopy with hydrothermal ablation (N/A, 03/06/2018); and Cholecystectomy (N/A, 02/15/2019).   Her family history includes Allergic rhinitis in her mother and sister; Allergies in her sister; Arthritis in her maternal grandmother and mother; Asthma  in her mother and sister; Cancer in her maternal grandfather and maternal grandmother; Congestive Heart Failure in her mother; Diabetes in her brother, maternal grandmother, and mother; Heart disease (age of onset: 79) in her mother; Hypertension in her brother, maternal grandmother, and mother; Stroke in her maternal grandfather and mother.She reports that she is a non-smoker but has been exposed to tobacco smoke. She has never used smokeless tobacco. She reports current alcohol use. She reports that she does not use drugs.  Current Outpatient Medications on File Prior to Visit  Medication Sig Dispense Refill  . azelastine (ASTELIN) 0.1 % nasal spray Place 2 sprays into both nostrils 2 (two) times daily. Use in each nostril as directed (Patient taking differently: Place 2 sprays into both nostrils daily as needed for allergies. ) 30 mL 2  . cetirizine (ZYRTEC ALLERGY) 10 MG tablet Take 1 tablet (10 mg total) by mouth daily. 30 tablet 11  . diphenhydrAMINE (BENADRYL) 25 MG tablet Take 25 mg by mouth every 6 (six) hours as needed for allergies.    Marland Kitchen empagliflozin (JARDIANCE) 25 MG TABS tablet Take 25 mg by mouth every evening.     Marland Kitchen EPINEPHrine (AUVI-Q) 0.3 mg/0.3 mL IJ SOAJ injection Inject 0.3 mLs (0.3 mg total) into the muscle as needed for anaphylaxis. 1 each 1  . glucose blood (CONTOUR NEXT TEST) test strip Check BG 3 times/day.  Dx E11.65    . Insulin Glargine (BASAGLAR KWIKPEN) 100 UNIT/ML SOPN Inject 65 Units into the skin 2 (two) times daily.     Marland Kitchen levonorgestrel (MIRENA, 52 MG,) 20 MCG/24HR IUD Mirena 20 mcg/24 hours (6 yrs) 52 mg intrauterine  device  Take 1 device by intrauterine route.    . montelukast (SINGULAIR) 10 MG tablet Take 1 tablet (10 mg total) by mouth at bedtime. 30 tablet 5  . naphazoline-pheniramine (VISINE) 0.025-0.3 % ophthalmic solution Place 1 drop into both eyes 4 (four) times daily as needed for eye irritation or allergies.    Marland Kitchen NIFEdipine (PROCARDIA XL/NIFEDICAL XL) 60  MG 24 hr tablet Take 1 tablet (60 mg total) by mouth daily. 90 tablet 1  . rosuvastatin (CRESTOR) 10 MG tablet Take 1 tablet (10 mg total) by mouth at bedtime. 30 tablet 2  . Semaglutide,0.25 or 0.5MG /DOS, (OZEMPIC, 0.25 OR 0.5 MG/DOSE,) 2 MG/1.5ML SOPN Ozempic 0.25 mg or 0.5 mg (2 mg/1.5 mL) subcutaneous pen injector    . senna (SENOKOT) 8.6 MG tablet Take by mouth daily as needed.      No current facility-administered medications on file prior to visit.     Objective:  Objective  Physical Exam Vitals and nursing note reviewed.  Constitutional:      Appearance: She is well-developed.  HENT:     Head: Normocephalic and atraumatic.   Eyes:     Conjunctiva/sclera: Conjunctivae normal.  Neck:     Thyroid: No thyromegaly.     Vascular: No carotid bruit or JVD.  Cardiovascular:     Rate and Rhythm: Normal rate and regular rhythm.     Heart sounds: Normal heart sounds. No murmur heard.   Pulmonary:     Effort: Pulmonary effort is normal. No respiratory distress.     Breath sounds: Normal breath sounds. No wheezing or rales.  Chest:     Chest wall: No tenderness.  Musculoskeletal:     Cervical back: Normal range of motion and neck supple.  Neurological:     Mental Status: She is alert and oriented to person, place, and time.    BP 110/78 (BP Location: Right Arm, Patient Position: Sitting, Cuff Size: Normal)   Pulse 86   Temp 98.2 F (36.8 C) (Oral)   Resp 18   Ht 5' 8.5" (1.74 m)   Wt 242 lb 3.2 oz (109.9 kg)   SpO2 97%   BMI 36.29 kg/m  Wt Readings from Last 3 Encounters:  11/26/19 242 lb 3.2 oz (109.9 kg)  11/22/19 242 lb 9.6 oz (110 kg)  08/16/19 245 lb (111.1 kg)     Lab Results  Component Value Date   WBC 7.8 11/26/2019   HGB 15.5 11/26/2019   HCT 46.2 (H) 11/26/2019   PLT 288 11/26/2019   GLUCOSE 221 (H) 11/26/2019   CHOL 228 (H) 11/26/2019   TRIG 214 (H) 11/26/2019   HDL 35 (L) 11/26/2019   LDLDIRECT 139.3 11/20/2010   LDLCALC 155 (H) 11/26/2019   ALT  29 11/26/2019   AST 24 11/26/2019   NA 138 11/26/2019   K 3.7 11/26/2019   CL 102 11/26/2019   CREATININE 0.82 11/26/2019   BUN 13 11/26/2019   CO2 25 11/26/2019   TSH 1.09 11/26/2019   HGBA1C 11.1 (H) 02/10/2019   MICROALBUR 4.0 (H) 06/12/2018    No results found.   Assessment & Plan:  Plan  I am having Demetrice M. Clinton Sawyer start on Ciclopirox and spironolactone. I am also having her maintain her Basaglar KwikPen, empagliflozin, azelastine, EPINEPHrine, diphenhydrAMINE, Ozempic (0.25 or 0.5 MG/DOSE), Mirena (52 MG), Contour Next Test, senna, Visine, cetirizine, rosuvastatin, NIFEdipine, montelukast, and ezetimibe.  Meds ordered this encounter  Medications  . ezetimibe (ZETIA) 10 MG tablet  Sig: TAKE 1 TABLET(10 MG) BY MOUTH DAILY    Dispense:  90 tablet    Refill:  1  . Ciclopirox 1 % shampoo    Sig: Massage into scalp , leave 5 min and rinse    Dispense:  120 mL    Refill:  0  . spironolactone (ALDACTONE) 25 MG tablet    Sig: Take 1 tablet (25 mg total) by mouth daily.    Dispense:  90 tablet    Refill:  1    Problem List Items Addressed This Visit      Unprioritized   Female pattern hair loss    Check labs  F/u derm       Relevant Orders   CBC with Differential/Platelet (Completed)   Thyroid Panel With TSH (Completed)   Comprehensive metabolic panel (Completed)   Testos,Total,Free and SHBG (Female)   Seborrheic dermatitis of scalp - Primary    F/u derm rx shampoo per orders       Relevant Medications   Ciclopirox 1 % shampoo    Other Visit Diagnoses    Hyperlipidemia, unspecified hyperlipidemia type       Relevant Medications   ezetimibe (ZETIA) 10 MG tablet   spironolactone (ALDACTONE) 25 MG tablet   Other Relevant Orders   Lipid panel (Completed)   CBC with Differential/Platelet (Completed)   Thyroid Panel With TSH (Completed)   Comprehensive metabolic panel (Completed)   Testos,Total,Free and SHBG (Female)   Iron Binding Cap  (TIBC)([facility])   Ferritin (Completed)   History of PCOS       Relevant Medications   spironolactone (ALDACTONE) 25 MG tablet   Other Relevant Orders   Iron Binding Cap (TIBC)([facility])   Ferritin (Completed)      Follow-up: Return in about 6 months (around 05/28/2020), or if symptoms worsen or fail to improve.  [name], DO

## 2019-11-26 NOTE — Patient Instructions (Signed)
Alopecia Areata, Adult  Alopecia areata is a condition that causes you to lose hair. You may lose hair on your scalp in patches. In some cases, you may lose all the hair on your scalp (alopecia totalis) or all the hair from your face and body (alopecia universalis). Alopecia areata is an autoimmune disease. This means that your body's defense system (immune system) mistakes normal parts of the body for germs or other things that can make you sick. When you have alopecia areata, the immune system attacks the hair follicles. Alopecia areata usually develops in childhood, but it can develop at any age. For some people, their hair grows back on its own and hair loss does not happen again. For others, their hair may fall out and grow back in cycles. The hair loss may last many years. Having this condition can be emotionally difficult, but it is not dangerous. What are the causes? The cause of this condition is not known. What increases the risk? This condition is more likely to develop in people who have:  A family history of alopecia.  A family history of another autoimmune disease, including type 1 diabetes and rheumatoid arthritis.  Asthma and allergies.  Down syndrome. What are the signs or symptoms? Round spots of patchy hair loss on the scalp is the main symptom of this condition. The spots may be mildly itchy. Other symptoms include:  Short dark hairs in the bald patches that are wider at the top (exclamation point hairs).  Dents, white spots, or lines in the fingernails or toenails.  Balding and body hair loss. This is rare. How is this diagnosed? This condition is diagnosed based on your symptoms and family history. Your health care provider will also check your scalp skin, teeth, and nails. Your health care provider may refer you to a specialist in hair and skin disorders (dermatologist). You may also have tests, including:  A hair pull test.  Blood tests or other screening tests  to check for autoimmune diseases, such as thyroid disease or diabetes.  Skin biopsy to confirm the diagnosis.  A procedure to examine the skin with a lighted magnifying instrument (dermoscopy). How is this treated? There is no cure for alopecia areata. Treatment is aimed at promoting the regrowth of hair and preventing the immune system from overreacting. No single treatment is right for all people with alopecia areata. It depends on the type of hair loss you have and how severe it is. Work with your health care provider to find the best treatment for you. Treatment may include:  Having regular checkups to make sure the condition is not getting worse (watchful waiting).  Steroid creams or pills for 6-8 weeks to stop the immune reaction and help hair to regrow more quickly.  Other topical medicines to alter the immune system response and support the hair growth cycle.  Steroid injections.  Therapy and counseling with a support group or therapist if you are having trouble coping with hair loss. Follow these instructions at home:  Learn as much as you can about your condition.  Apply topical creams only as told by your health care provider.  Take over-the-counter and prescription medicines only as told by your health care provider.  Consider getting a wig or products to make hair look fuller or to cover bald spots, if you feel uncomfortable with your appearance.  Get therapy or counseling if you are having a hard time coping with hair loss. Ask your health care provider to recommend   a counselor or support group.  Keep all follow-up visits as told by your health care provider. This is important. Contact a health care provider if:  Your hair loss gets worse, even with treatment.  You have new symptoms.  You are struggling emotionally. Summary  Alopecia areata is an autoimmune condition that makes your body's defense system (immune system) attack the hair follicles. This causes you  to lose hair.  Treatments may include regular checkups to make sure that the condition is not getting worse (watchful waiting), medicines, and steroid injections. This information is not intended to replace advice given to you by your health care provider. Make sure you discuss any questions you have with your health care provider. Document Revised: 02/28/2017 Document Reviewed: 04/05/2016 Elsevier Patient Education  2020 Elsevier Inc.  

## 2019-11-27 LAB — IRON, TOTAL/TOTAL IRON BINDING CAP
%SAT: 20 % (calc) (ref 16–45)
Iron: 67 ug/dL (ref 40–190)
TIBC: 339 mcg/dL (calc) (ref 250–450)

## 2019-11-28 DIAGNOSIS — L219 Seborrheic dermatitis, unspecified: Secondary | ICD-10-CM | POA: Insufficient documentation

## 2019-11-28 DIAGNOSIS — L658 Other specified nonscarring hair loss: Secondary | ICD-10-CM | POA: Insufficient documentation

## 2019-11-28 NOTE — Assessment & Plan Note (Signed)
Check labs  F/u derm

## 2019-11-28 NOTE — Assessment & Plan Note (Signed)
F/u derm rx shampoo per orders

## 2019-11-30 LAB — THYROID PANEL WITH TSH
Free Thyroxine Index: 2.4 (ref 1.4–3.8)
T3 Uptake: 28 % (ref 22–35)
T4, Total: 8.4 ug/dL (ref 5.1–11.9)
TSH: 1.09 mIU/L

## 2019-11-30 LAB — COMPREHENSIVE METABOLIC PANEL
AG Ratio: 1.6 (calc) (ref 1.0–2.5)
ALT: 29 U/L (ref 6–29)
AST: 24 U/L (ref 10–35)
Albumin: 4.4 g/dL (ref 3.6–5.1)
Alkaline phosphatase (APISO): 87 U/L (ref 31–125)
BUN: 13 mg/dL (ref 7–25)
CO2: 25 mmol/L (ref 20–32)
Calcium: 9.8 mg/dL (ref 8.6–10.2)
Chloride: 102 mmol/L (ref 98–110)
Creat: 0.82 mg/dL (ref 0.50–1.10)
Globulin: 2.7 g/dL (calc) (ref 1.9–3.7)
Glucose, Bld: 221 mg/dL — ABNORMAL HIGH (ref 65–99)
Potassium: 3.7 mmol/L (ref 3.5–5.3)
Sodium: 138 mmol/L (ref 135–146)
Total Bilirubin: 0.3 mg/dL (ref 0.2–1.2)
Total Protein: 7.1 g/dL (ref 6.1–8.1)

## 2019-11-30 LAB — LIPID PANEL
Cholesterol: 228 mg/dL — ABNORMAL HIGH (ref <200)
HDL: 35 mg/dL — ABNORMAL LOW (ref >=50)
LDL Cholesterol (Calc): 155 mg/dL (calc) — ABNORMAL HIGH
Non-HDL Cholesterol (Calc): 193 mg/dL (calc) — ABNORMAL HIGH (ref <130)
Total CHOL/HDL Ratio: 6.5 (calc) — ABNORMAL HIGH (ref <5.0)
Triglycerides: 214 mg/dL — ABNORMAL HIGH (ref <150)

## 2019-11-30 LAB — TESTOS,TOTAL,FREE AND SHBG (FEMALE)
Free Testosterone: 10.9 pg/mL — ABNORMAL HIGH (ref 0.1–6.4)
Sex Hormone Binding: 32 nmol/L (ref 17–124)
Testosterone, Total, LC-MS-MS: 69 ng/dL — ABNORMAL HIGH (ref 2–45)

## 2019-11-30 LAB — CBC WITH DIFFERENTIAL/PLATELET
Absolute Monocytes: 437 cells/uL (ref 200–950)
Basophils Absolute: 31 cells/uL (ref 0–200)
Basophils Relative: 0.4 %
Eosinophils Absolute: 226 cells/uL (ref 15–500)
Eosinophils Relative: 2.9 %
HCT: 46.2 % — ABNORMAL HIGH (ref 35.0–45.0)
Hemoglobin: 15.5 g/dL (ref 11.7–15.5)
Lymphs Abs: 3019 cells/uL (ref 850–3900)
MCH: 29.5 pg (ref 27.0–33.0)
MCHC: 33.5 g/dL (ref 32.0–36.0)
MCV: 88 fL (ref 80.0–100.0)
MPV: 10.1 fL (ref 7.5–12.5)
Monocytes Relative: 5.6 %
Neutro Abs: 4087 cells/uL (ref 1500–7800)
Neutrophils Relative %: 52.4 %
Platelets: 288 10*3/uL (ref 140–400)
RBC: 5.25 10*6/uL — ABNORMAL HIGH (ref 3.80–5.10)
RDW: 12.5 % (ref 11.0–15.0)
Total Lymphocyte: 38.7 %
WBC: 7.8 10*3/uL (ref 3.8–10.8)

## 2019-12-19 ENCOUNTER — Encounter: Payer: Self-pay | Admitting: Family Medicine

## 2019-12-20 NOTE — Telephone Encounter (Signed)
Please advise 

## 2019-12-20 NOTE — Telephone Encounter (Signed)
She needs app 

## 2019-12-23 ENCOUNTER — Encounter: Payer: Self-pay | Admitting: Family Medicine

## 2019-12-23 ENCOUNTER — Telehealth (INDEPENDENT_AMBULATORY_CARE_PROVIDER_SITE_OTHER): Payer: Managed Care, Other (non HMO) | Admitting: Family Medicine

## 2019-12-23 DIAGNOSIS — K581 Irritable bowel syndrome with constipation: Secondary | ICD-10-CM

## 2019-12-23 MED ORDER — LINACLOTIDE 290 MCG PO CAPS: 290.0000 ug | ORAL_CAPSULE | Freq: Every day | ORAL | 2 refills | Status: DC

## 2019-12-23 NOTE — Progress Notes (Signed)
Virtual Visit via Video Note  I connected with Posey Pronto on 12/23/19 at  9:00 AM EDT by a video enabled telemedicine application and verified that I am speaking with the correct person using two identifiers.  Location: Patient: home alone  Provider: office    I discussed the limitations of evaluation and management by telemedicine and the availability of in person appointments. The patient expressed understanding and agreed to proceed.  History of Present Illness: Pt is home c/o abd pain and bloating over weekend.  She used sennokot and miralax with no relief.  She has a hx ibs with constipation    Observations/Objective: There were no vitals filed for this visit. Pt is in nad   Assessment and Plan: 1. Irritable bowel syndrome with constipation Refer to GI due to long hx ibs linzess--- discussed side effects and told pt to call if she develops diarrhea etc  - linaclotide (LINZESS) 290 MCG CAPS capsule; Take 1 capsule (290 mcg total) by mouth daily before breakfast.  Dispense: 30 capsule; Refill: 2 - Ambulatory referral to Gastroenterology   Follow Up Instructions:    I discussed the assessment and treatment plan with the patient. The patient was provided an opportunity to ask questions and all were answered. The patient agreed with the plan and demonstrated an understanding of the instructions.   The patient was advised to call back or seek an in-person evaluation if the symptoms worsen or if the condition fails to improve as anticipated.  I provided 25 minutes of non-face-to-face time during this encounter.   [name], DO

## 2019-12-28 ENCOUNTER — Telehealth: Payer: Self-pay | Admitting: Family Medicine

## 2019-12-28 NOTE — Telephone Encounter (Signed)
Pt dropped off document to be filled out by provider ( 3 Pages in a small white envelope - Health Examination Certificate) Pt would like to be called when document ready to pick up at [phone]. Document put at front office tray under providers name.

## 2019-12-29 NOTE — Telephone Encounter (Signed)
Received. Will give to Lowne tomorrow 

## 2019-12-31 ENCOUNTER — Other Ambulatory Visit: Payer: Self-pay | Admitting: Family Medicine

## 2019-12-31 DIAGNOSIS — Z23 Encounter for immunization: Secondary | ICD-10-CM

## 2020-01-05 NOTE — Telephone Encounter (Signed)
Lowne placed future lab appts. Pt called. LVM to schedule a lab appt. Forms on my desk

## 2020-01-25 NOTE — Telephone Encounter (Signed)
Patient called in reference to form dropped off to be complete by DR Laury Axon, Patient states she would like a  Call  Back

## 2020-01-31 NOTE — Telephone Encounter (Signed)
Left message on machine to call back  

## 2020-02-18 ENCOUNTER — Other Ambulatory Visit: Payer: Self-pay

## 2020-02-18 ENCOUNTER — Ambulatory Visit (INDEPENDENT_AMBULATORY_CARE_PROVIDER_SITE_OTHER): Payer: BC Managed Care – PPO | Admitting: Family Medicine

## 2020-02-18 ENCOUNTER — Encounter: Payer: Self-pay | Admitting: Family Medicine

## 2020-02-18 VITALS — BP 110/70 | HR 71 | Temp 99.0°F | Resp 18 | Ht 68.5 in | Wt 240.6 lb

## 2020-02-18 DIAGNOSIS — K581 Irritable bowel syndrome with constipation: Secondary | ICD-10-CM | POA: Diagnosis not present

## 2020-02-18 DIAGNOSIS — Z0000 Encounter for general adult medical examination without abnormal findings: Secondary | ICD-10-CM | POA: Diagnosis not present

## 2020-02-18 DIAGNOSIS — I10 Essential (primary) hypertension: Secondary | ICD-10-CM

## 2020-02-18 DIAGNOSIS — E785 Hyperlipidemia, unspecified: Secondary | ICD-10-CM

## 2020-02-18 DIAGNOSIS — Z23 Encounter for immunization: Secondary | ICD-10-CM

## 2020-02-18 DIAGNOSIS — E1169 Type 2 diabetes mellitus with other specified complication: Secondary | ICD-10-CM | POA: Diagnosis not present

## 2020-02-18 LAB — COMPREHENSIVE METABOLIC PANEL
ALT: 22 U/L (ref 0–35)
AST: 16 U/L (ref 0–37)
Albumin: 4.4 g/dL (ref 3.5–5.2)
Alkaline Phosphatase: 84 U/L (ref 39–117)
BUN: 15 mg/dL (ref 6–23)
CO2: 27 mEq/L (ref 19–32)
Calcium: 9.9 mg/dL (ref 8.4–10.5)
Chloride: 102 mEq/L (ref 96–112)
Creatinine, Ser: 0.91 mg/dL (ref 0.40–1.20)
GFR: 75.31 mL/min (ref >60.00)
Glucose, Bld: 171 mg/dL — ABNORMAL HIGH (ref 70–99)
Potassium: 4.2 mEq/L (ref 3.5–5.1)
Sodium: 139 mEq/L (ref 135–145)
Total Bilirubin: 0.4 mg/dL (ref 0.2–1.2)
Total Protein: 7.6 g/dL (ref 6.0–8.3)

## 2020-02-18 LAB — LIPID PANEL
Cholesterol: 200 mg/dL (ref 0–200)
HDL: 44.3 mg/dL (ref >39.00)
LDL Cholesterol: 139 mg/dL — ABNORMAL HIGH (ref 0–99)
NonHDL: 155.4
Total CHOL/HDL Ratio: 5
Triglycerides: 82 mg/dL (ref 0.0–149.0)
VLDL: 16.4 mg/dL (ref 0.0–40.0)

## 2020-02-18 LAB — CBC WITH DIFFERENTIAL/PLATELET
Basophils Absolute: 0 10*3/uL (ref 0.0–0.1)
Basophils Relative: 0.5 % (ref 0.0–3.0)
Eosinophils Absolute: 0.2 10*3/uL (ref 0.0–0.7)
Eosinophils Relative: 2.3 % (ref 0.0–5.0)
HCT: 45.4 % (ref 36.0–46.0)
Hemoglobin: 15.2 g/dL — ABNORMAL HIGH (ref 12.0–15.0)
Lymphocytes Relative: 33.2 % (ref 12.0–46.0)
Lymphs Abs: 3 10*3/uL (ref 0.7–4.0)
MCHC: 33.5 g/dL (ref 30.0–36.0)
MCV: 88.6 fl (ref 78.0–100.0)
Monocytes Absolute: 0.5 10*3/uL (ref 0.1–1.0)
Monocytes Relative: 6.1 % (ref 3.0–12.0)
Neutro Abs: 5.2 10*3/uL (ref 1.4–7.7)
Neutrophils Relative %: 57.9 % (ref 43.0–77.0)
Platelets: 264 10*3/uL (ref 150.0–400.0)
RBC: 5.12 Mil/uL — ABNORMAL HIGH (ref 3.87–5.11)
RDW: 13.1 % (ref 11.5–15.5)
WBC: 8.9 10*3/uL (ref 4.0–10.5)

## 2020-02-18 LAB — MICROALBUMIN / CREATININE URINE RATIO
Creatinine,U: 59.9 mg/dL
Microalb Creat Ratio: 2 mg/g (ref 0.0–30.0)
Microalb, Ur: 1.2 mg/dL (ref 0.0–1.9)

## 2020-02-18 LAB — TSH: TSH: 1.89 u[IU]/mL (ref 0.35–4.50)

## 2020-02-18 MED ORDER — NIFEDIPINE ER OSMOTIC RELEASE 60 MG PO TB24: 60.0000 mg | ORAL_TABLET | Freq: Every day | ORAL | 1 refills | Status: DC

## 2020-02-18 NOTE — Assessment & Plan Note (Signed)
Tolerating statin, encouraged heart healthy diet, avoid trans fats, minimize simple carbs and saturated fats. Increase exercise as tolerated 

## 2020-02-18 NOTE — Assessment & Plan Note (Signed)
Referral put in again for gi

## 2020-02-18 NOTE — Patient Instructions (Signed)

## 2020-02-18 NOTE — Assessment & Plan Note (Signed)
ghm utd Check labs  

## 2020-02-18 NOTE — Assessment & Plan Note (Signed)
Well controlled, no changes to meds. Encouraged heart healthy diet such as the DASH diet and exercise as tolerated.  °

## 2020-02-18 NOTE — Assessment & Plan Note (Signed)
Per endo °

## 2020-02-18 NOTE — Progress Notes (Signed)
Subjective:     Kathleen Jordan is a 49 y.o. female and is here for a comprehensive physical exam. The patient reports problems - ibs with constipation--- pt never made gi app-- she needs no refills at this time .  Social History   Socioeconomic History  . Marital status: Married    Spouse name: Caryn Bee  . Number of children: 1  . Years of education: Masters  . Highest education level: Not on file  Occupational History  . 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 [place]  Tobacco Use  . Smoking status: Passive Smoke Exposure - Never Smoker  . Smokeless tobacco: Never Used  . Tobacco comment: spouse smokes outside the home  Vaping Use  . Vaping Use: Never used  Substance and Sexual Activity  . Alcohol use: Yes    Comment: social  . Drug use: No  . Sexual activity: Yes    Partners: Male    Birth control/protection: None  Other Topics Concern  . Not on file  Social History Narrative   Drinks caffeine 2x a week    Social Determinants of Health   Financial Resource Strain:   . Difficulty of Paying Living Expenses: Not on file  Food Insecurity:   . Worried About Programme researcher, broadcasting/film/video in the Last Year: Not on file  . Ran Out of Food in the Last Year: Not on file  Transportation Needs:   . Lack of Transportation (Medical): Not on file  . Lack of Transportation (Non-Medical): Not on file  Physical Activity:   . Days of Exercise per Week: Not on file  . Minutes of Exercise per Session: Not on file  Stress:   . Feeling of Stress : Not on file  Social Connections:   . Frequency of Communication with Friends and Family: Not on file  . Frequency of Social Gatherings with Friends and Family: Not on file  . Attends Religious Services: Not on file  . Active Member of Clubs or Organizations: Not on file  . Attends Banker Meetings: Not on file  . Marital Status: Not on file  Intimate Partner  Violence:   . Fear of Current or Ex-Partner: Not on file  . Emotionally Abused: Not on file  . Physically Abused: Not on file  . Sexually Abused: Not on file   Health Maintenance  Topic Date Due  . OPHTHALMOLOGY EXAM  12/11/2018  . FOOT EXAM  06/12/2019  . URINE MICROALBUMIN  06/12/2019  . HEMOGLOBIN A1C  08/10/2019  . PAP SMEAR-Modifier  03/30/2021  . TETANUS/TDAP  11/03/2022  . INFLUENZA VACCINE  Completed  . PNEUMOCOCCAL POLYSACCHARIDE VACCINE AGE 32-64 HIGH RISK  Completed  . COVID-19 Vaccine  Completed  . Hepatitis C Screening  Completed  . HIV Screening  Completed    The following portions of the patient's history were reviewed and updated as appropriate:  She  has a past medical history of Allergic rhinitis, Anal fissure (07/15/2017), Chronic constipation, Frequency of urination, GERD (gastroesophageal reflux disease), History of palpitations, varicella, Hyperlipidemia, Hypertension, Menorrhagia, OSA (obstructive sleep apnea), PCOS (polycystic ovarian syndrome), Peripheral neuropathy, Thyroid goiter, Type 2 diabetes mellitus with hyperglycemia, with long-term current use of insulin (HCC), and Urgency of urination. She does not have any pertinent problems on file. She  has a past surgical history that includes Breast reduction surgery (Bilateral, 07-18-2003   dr Shon Hough  [facility]); invitro fertilization (11/16/2010); Colonoscopy  with propofol (N/A, 07/15/2017); Dilatation & currettage/hysteroscopy with hydrothermal ablation (N/A, 03/06/2018); and Cholecystectomy (N/A, 02/15/2019). Her family history includes Allergic rhinitis in her mother and sister; Allergies in her sister; Arthritis in her maternal grandmother and mother; Asthma in her mother and sister; Cancer in her maternal grandfather and maternal grandmother; Congestive Heart Failure in her mother; Diabetes in her brother, maternal grandmother, and mother; Heart disease (age of onset: 60) in her mother; Hypertension in her brother,  maternal grandmother, and mother; Stroke in her maternal grandfather and mother. She  reports that she is a non-smoker but has been exposed to tobacco smoke. She has never used smokeless tobacco. She reports current alcohol use. She reports that she does not use drugs. She has a current medication list which includes the following prescription(s): azelastine, cetirizine, ciclopirox, diphenhydramine, empagliflozin, epinephrine, ezetimibe, contour next test, basaglar kwikpen, mirena (52 mg), linaclotide, montelukast, visine, nifedipine, rosuvastatin, ozempic (0.25 or 0.5 mg/dose), senna, and spironolactone. Current Outpatient Medications on File Prior to Visit  Medication Sig Dispense Refill  . azelastine (ASTELIN) 0.1 % nasal spray Place 2 sprays into both nostrils 2 (two) times daily. Use in each nostril as directed (Patient taking differently: Place 2 sprays into both nostrils daily as needed for allergies. ) 30 mL 2  . cetirizine (ZYRTEC ALLERGY) 10 MG tablet Take 1 tablet (10 mg total) by mouth daily. 30 tablet 11  . Ciclopirox 1 % shampoo Massage into scalp , leave 5 min and rinse 120 mL 0  . diphenhydrAMINE (BENADRYL) 25 MG tablet Take 25 mg by mouth every 6 (six) hours as needed for allergies.    Marland Kitchen empagliflozin (JARDIANCE) 25 MG TABS tablet Take 25 mg by mouth every evening.     Marland Kitchen EPINEPHrine (AUVI-Q) 0.3 mg/0.3 mL IJ SOAJ injection Inject 0.3 mLs (0.3 mg total) into the muscle as needed for anaphylaxis. 1 each 1  . ezetimibe (ZETIA) 10 MG tablet TAKE 1 TABLET(10 MG) BY MOUTH DAILY 90 tablet 1  . glucose blood (CONTOUR NEXT TEST) test strip Check BG 3 times/day.  Dx E11.65    . Insulin Glargine (BASAGLAR KWIKPEN) 100 UNIT/ML SOPN Inject 65 Units into the skin 2 (two) times daily.     Marland Kitchen levonorgestrel (MIRENA, 52 MG,) 20 MCG/24HR IUD Mirena 20 mcg/24 hours (6 yrs) 52 mg intrauterine device  Take 1 device by intrauterine route.    . linaclotide (LINZESS) 290 MCG CAPS capsule Take 1 capsule (290  mcg total) by mouth daily before breakfast. 30 capsule 2  . montelukast (SINGULAIR) 10 MG tablet Take 1 tablet (10 mg total) by mouth at bedtime. 30 tablet 5  . naphazoline-pheniramine (VISINE) 0.025-0.3 % ophthalmic solution Place 1 drop into both eyes 4 (four) times daily as needed for eye irritation or allergies.    . rosuvastatin (CRESTOR) 10 MG tablet Take 1 tablet (10 mg total) by mouth at bedtime. 30 tablet 2  . Semaglutide,0.25 or 0.5MG /DOS, (OZEMPIC, 0.25 OR 0.5 MG/DOSE,) 2 MG/1.5ML SOPN Ozempic 0.25 mg or 0.5 mg (2 mg/1.5 mL) subcutaneous pen injector    . senna (SENOKOT) 8.6 MG tablet Take by mouth daily as needed.     Marland Kitchen spironolactone (ALDACTONE) 25 MG tablet Take 1 tablet (25 mg total) by mouth daily. 90 tablet 1   No current facility-administered medications on file prior to visit.   She has No Known Allergies..  Review of Systems Review of Systems  Constitutional: Negative for activity change, appetite change and fatigue.  HENT: Negative for hearing  loss, congestion, tinnitus and ear discharge.  dentist q73m Eyes: Negative for visual disturbance (see optho q1y -- vision corrected to 20/20 with glasses).  Respiratory: Negative for cough, chest tightness and shortness of breath.   Cardiovascular: Negative for chest pain, palpitations and leg swelling.  Gastrointestinal: Negative for abdominal pain, diarrhea, and abdominal distention.  Genitourinary: Negative for urgency, frequency, decreased urine volume and difficulty urinating.  Musculoskeletal: Negative for back pain, arthralgias and gait problem.  Skin: Negative for color change, pallor and rash.  Neurological: Negative for dizziness, light-headedness, numbness and headaches.  Hematological: Negative for adenopathy. Does not bruise/bleed easily.  Psychiatric/Behavioral: Negative for suicidal ideas, confusion, sleep disturbance, self-injury, dysphoric mood, decreased concentration and agitation.       Objective:    BP  110/70 (BP Location: Right Arm, Patient Position: Sitting, Cuff Size: Large)   Pulse 71   Temp 99 F (37.2 C) (Oral)   Resp 18   Ht 5' 8.5" (1.74 m)   Wt 240 lb 9.6 oz (109.1 kg)   SpO2 96%   BMI 36.05 kg/m  General appearance: alert, cooperative, appears stated age and no distress Head: Normocephalic, without obvious abnormality, atraumatic Eyes: conjunctivae/corneas clear. PERRL, EOM's intact. Fundi benign. Ears: normal TM's and external ear canals both ears Neck: no adenopathy, no carotid bruit, no JVD, supple, symmetrical, trachea midline and thyroid not enlarged, symmetric, no tenderness/mass/nodules Back: symmetric, no curvature. ROM normal. No CVA tenderness. Lungs: clear to auscultation bilaterally Breasts: gyn Heart: regular rate and rhythm, S1, S2 normal, no murmur, click, rub or gallop Abdomen: soft, non-tender; bowel sounds normal; no masses,  no organomegaly Pelvic: deferred Extremities: extremities normal, atraumatic, no cyanosis or edema Pulses: 2+ and symmetric Skin: Skin color, texture, turgor normal. No rashes or lesions Lymph nodes: Cervical, supraclavicular, and axillary nodes normal. Neurologic: Alert and oriented X 3, normal strength and tone. Normal symmetric reflexes. Normal coordination and gait    Assessment:    Healthy female exam.      Plan:    ghm utd Check labs See avs 1. Essential hypertension Well controlled, no changes to meds. Encouraged heart healthy diet such as the DASH diet and exercise as tolerated.   - NIFEdipine (PROCARDIA XL/NIFEDICAL XL) 60 MG 24 hr tablet; Take 1 tablet (60 mg total) by mouth daily.  Dispense: 90 tablet; Refill: 1 - Microalbumin / creatinine urine ratio - Lipid panel - CBC with Differential/Platelet - Comprehensive metabolic panel - TSH  2. Preventative health care See above  - Lipid panel - CBC with Differential/Platelet - Comprehensive metabolic panel - TSH  3. Hyperlipidemia, unspecified  hyperlipidemia type Encouraged heart healthy diet, increase exercise, avoid trans fats, consider a krill oil cap daily - Lipid panel - Comprehensive metabolic panel  4. Irritable bowel syndrome with constipation ' - Ambulatory referral to Gastroenterology  5. Need for vaccination ' - Measles/Mumps/Rubella Immunity - Hepatitis B surface antibody,quantitative - QuantiFERON-TB Gold Plus  6. Hyperlipidemia associated with type 2 diabetes mellitus (HCC) Encouraged heart healthy diet, increase exercise, avoid trans fats, consider a krill oil cap daily  See After Visit Summary for Counseling Recommendations

## 2020-02-21 LAB — QUANTIFERON-TB GOLD PLUS
Mitogen-NIL: >10 IU/mL
NIL: 0.01 IU/mL
QuantiFERON-TB Gold Plus: NEGATIVE
TB1-NIL: 0.01 IU/mL
TB2-NIL: 0.01 IU/mL

## 2020-02-21 LAB — MEASLES/MUMPS/RUBELLA IMMUNITY
Mumps IgG: 207 AU/mL
Rubella: 2.24 Index
Rubeola IgG: 26.7 AU/mL

## 2020-02-21 LAB — HEPATITIS B SURFACE ANTIBODY, QUANTITATIVE: Hep B S AB Quant (Post): 426 m[IU]/mL (ref >=10)

## 2020-03-07 ENCOUNTER — Encounter: Payer: Self-pay | Admitting: Family Medicine

## 2020-04-04 ENCOUNTER — Ambulatory Visit: Payer: BC Managed Care – PPO | Admitting: Gastroenterology

## 2020-04-11 ENCOUNTER — Encounter: Payer: Self-pay | Admitting: Family Medicine

## 2020-05-22 ENCOUNTER — Ambulatory Visit: Payer: Self-pay | Admitting: Allergy

## 2020-06-13 ENCOUNTER — Other Ambulatory Visit: Payer: Self-pay | Admitting: Family Medicine

## 2020-06-13 DIAGNOSIS — Z8742 Personal history of other diseases of the female genital tract: Secondary | ICD-10-CM

## 2020-07-04 ENCOUNTER — Ambulatory Visit: Payer: BC Managed Care – PPO | Admitting: Family Medicine

## 2020-07-04 ENCOUNTER — Encounter: Payer: Self-pay | Admitting: Family Medicine

## 2020-07-04 ENCOUNTER — Other Ambulatory Visit: Payer: Self-pay

## 2020-07-04 VITALS — BP 118/60 | HR 89 | Temp 99.1°F | Resp 18 | Ht 68.5 in | Wt 242.2 lb

## 2020-07-04 DIAGNOSIS — B353 Tinea pedis: Secondary | ICD-10-CM | POA: Diagnosis not present

## 2020-07-04 MED ORDER — CLOTRIMAZOLE-BETAMETHASONE 1-0.05 % EX CREA: 1.0000 "application " | TOPICAL_CREAM | Freq: Two times a day (BID) | CUTANEOUS | 0 refills | Status: DC

## 2020-07-04 NOTE — Patient Instructions (Signed)
Athlete's Foot  Athlete's foot (tinea pedis) is a fungal infection of the skin on your feet. It often occurs on the skin that is between or underneath your toes. It can also occur on the soles of your feet. Symptoms include itchy or white and flaky areas on the skin. The infection can spread from person to person (is contagious). It can also spread when a person's bare feet come in contact with the fungus on shower floors or on items such as shoes. Follow these instructions at home: Medicines  Apply or take over-the-counter and prescription medicines only as told by your doctor.  Apply your antifungal medicine as told by your doctor. Do not stop using the medicine even if your feet start to get better. Foot care  Do not scratch your feet.  Keep your feet dry: ? Wear cotton or wool socks. Change your socks every day or if they become wet. ? Wear shoes that allow air to move around, such as sandals or canvas tennis shoes.  Wash and dry your feet: ? Every day or as told by your doctor. ? After exercising. ? Including the area between your toes. General instructions  Do not share any of these items that touch your feet: ? Towels. ? Shoes. ? Nail clippers. ? Other personal items.  Protect your feet by wearing sandals in wet areas, such as locker rooms and shared showers.  Keep all follow-up visits as told by your doctor. This is important.  If you have diabetes, keep your blood sugar under control. Contact a doctor if:  You have a fever.  You have swelling, pain, warmth, or redness in your foot.  Your feet are not getting better with treatment.  Your symptoms get worse.  You have new symptoms. Summary  Athlete's foot is a fungal infection of the skin on your feet.  Symptoms include itchy or white and flaky areas on the skin.  Apply your antifungal medicine as told by your doctor.  Keep your feet clean and dry. This information is not intended to replace advice given  to you by your health care provider. Make sure you discuss any questions you have with your health care provider. Document Revised: 11/04/2019 Document Reviewed: 11/04/2019 Elsevier Patient Education  2021 Elsevier Inc.  

## 2020-07-04 NOTE — Progress Notes (Signed)
Patient ID: Joelie Schou, female    DOB: [birth year]  Age: 50 y.o. MRN: [id number]    Subjective:  Subjective  HPI Ravan Lizzie An presents for rash on her feet that is very itchy.   She has tried clomtrimezole with very little relief.    Review of Systems  Constitutional: Negative for appetite change, diaphoresis, fatigue and unexpected weight change.  Eyes: Negative for pain, redness and visual disturbance.  Respiratory: Negative for cough, chest tightness, shortness of breath and wheezing.   Cardiovascular: Negative for chest pain, palpitations and leg swelling.  Endocrine: Negative for cold intolerance, heat intolerance, polydipsia, polyphagia and polyuria.  Genitourinary: Negative for difficulty urinating, dysuria and frequency.  Skin: Positive for rash.  Neurological: Negative for dizziness, light-headedness, numbness and headaches.    History Past Medical History:  Diagnosis Date  . Allergic rhinitis   . Anal fissure 07/15/2017  . Chronic constipation   . Frequency of urination   . GERD (gastroesophageal reflux disease)   . History of palpitations   . Hx of varicella   . Hyperlipidemia   . Hypertension   . Menorrhagia   . OSA (obstructive sleep apnea)    08-07-2017 no cpap due to finanaces  . PCOS (polycystic ovarian syndrome)   . Peripheral neuropathy   . Thyroid goiter   . Type 2 diabetes mellitus with hyperglycemia, with long-term current use of insulin (HCC)   . Urgency of urination     She has a past surgical history that includes Breast reduction surgery (Bilateral, 07-18-2003   dr Shon Hough  [facility]); invitro fertilization (11/16/2010); Colonoscopy with propofol (N/A, 07/15/2017); Dilatation & currettage/hysteroscopy with hydrothermal ablation (N/A, 03/06/2018); and Cholecystectomy (N/A, 02/15/2019).   Her family history includes Allergic rhinitis in her mother and sister; Allergies in her sister; Arthritis in her maternal grandmother and mother;  Asthma in her mother and sister; Cancer in her maternal grandfather and maternal grandmother; Congestive Heart Failure in her mother; Diabetes in her brother, maternal grandmother, and mother; Heart disease (age of onset: 32) in her mother; Hypertension in her brother, maternal grandmother, and mother; Stroke in her maternal grandfather and mother.She reports that she is a non-smoker but has been exposed to tobacco smoke. She has never used smokeless tobacco. She reports current alcohol use. She reports that she does not use drugs.  Current Outpatient Medications on File Prior to Visit  Medication Sig Dispense Refill  . azelastine (ASTELIN) 0.1 % nasal spray Place 2 sprays into both nostrils 2 (two) times daily. Use in each nostril as directed (Patient taking differently: Place 2 sprays into both nostrils daily as needed for allergies.) 30 mL 2  . cetirizine (ZYRTEC ALLERGY) 10 MG tablet Take 1 tablet (10 mg total) by mouth daily. 30 tablet 11  . Ciclopirox 1 % shampoo Massage into scalp , leave 5 min and rinse 120 mL 0  . diphenhydrAMINE (BENADRYL) 25 MG tablet Take 25 mg by mouth every 6 (six) hours as needed for allergies.    Marland Kitchen empagliflozin (JARDIANCE) 25 MG TABS tablet Take 25 mg by mouth every evening.     Marland Kitchen EPINEPHrine (AUVI-Q) 0.3 mg/0.3 mL IJ SOAJ injection Inject 0.3 mLs (0.3 mg total) into the muscle as needed for anaphylaxis. 1 each 1  . ezetimibe (ZETIA) 10 MG tablet TAKE 1 TABLET(10 MG) BY MOUTH DAILY 90 tablet 1  . glucose blood (CONTOUR NEXT TEST) test strip Check BG 3 times/day.  Dx E11.65    . Insulin Glargine [facility])  100 UNIT/ML SOPN Inject 65 Units into the skin 2 (two) times daily.     Marland Kitchen levonorgestrel (MIRENA, 52 MG,) 20 MCG/24HR IUD Mirena 20 mcg/24 hours (6 yrs) 52 mg intrauterine device  Take 1 device by intrauterine route.    . linaclotide (LINZESS) 290 MCG CAPS capsule Take 1 capsule (290 mcg total) by mouth daily before breakfast. 30 capsule 2  . montelukast  (SINGULAIR) 10 MG tablet Take 1 tablet (10 mg total) by mouth at bedtime. 30 tablet 5  . naphazoline-pheniramine (VISINE) 0.025-0.3 % ophthalmic solution Place 1 drop into both eyes 4 (four) times daily as needed for eye irritation or allergies.    Marland Kitchen NIFEdipine (PROCARDIA XL/NIFEDICAL XL) 60 MG 24 hr tablet Take 1 tablet (60 mg total) by mouth daily. 90 tablet 1  . rosuvastatin (CRESTOR) 10 MG tablet Take 1 tablet (10 mg total) by mouth at bedtime. 30 tablet 2  . Semaglutide,0.25 or 0.5MG /DOS, (OZEMPIC, 0.25 OR 0.5 MG/DOSE,) 2 MG/1.5ML SOPN Ozempic 0.25 mg or 0.5 mg (2 mg/1.5 mL) subcutaneous pen injector    . spironolactone (ALDACTONE) 25 MG tablet TAKE 1 TABLET(25 MG) BY MOUTH DAILY 90 tablet 1  . senna (SENOKOT) 8.6 MG tablet Take by mouth daily as needed.      No current facility-administered medications on file prior to visit.     Objective:  Objective  Physical Exam Vitals and nursing note reviewed.  Constitutional:      Appearance: She is well-developed.  HENT:     Head: Normocephalic and atraumatic.  Eyes:     Conjunctiva/sclera: Conjunctivae normal.  Neck:     Thyroid: No thyromegaly.     Vascular: No carotid bruit or JVD.  Cardiovascular:     Rate and Rhythm: Normal rate and regular rhythm.     Heart sounds: Normal heart sounds. No murmur heard.   Pulmonary:     Effort: Pulmonary effort is normal. No respiratory distress.     Breath sounds: Normal breath sounds. No wheezing or rales.  Chest:     Chest wall: No tenderness.  Musculoskeletal:     Cervical back: Normal range of motion and neck supple.  Skin:    Findings: Rash present.  Neurological:     Mental Status: She is alert and oriented to person, place, and time.    BP 118/60 (BP Location: Right Arm, Patient Position: Sitting, Cuff Size: Large)   Pulse 89   Temp 99.1 F (37.3 C) (Oral)   Resp 18   Ht 5' 8.5" (1.74 m)   Wt 242 lb 3.2 oz (109.9 kg)   SpO2 97%   BMI 36.29 kg/m  Wt Readings from Last 3  Encounters:  07/04/20 242 lb 3.2 oz (109.9 kg)  02/18/20 240 lb 9.6 oz (109.1 kg)  11/26/19 242 lb 3.2 oz (109.9 kg)     Lab Results  Component Value Date   WBC 8.9 02/18/2020   HGB 15.2 (H) 02/18/2020   HCT 45.4 02/18/2020   PLT 264.0 02/18/2020   GLUCOSE 171 (H) 02/18/2020   CHOL 200 02/18/2020   TRIG 82.0 02/18/2020   HDL 44.30 02/18/2020   LDLDIRECT 139.3 11/20/2010   LDLCALC 139 (H) 02/18/2020   ALT 22 02/18/2020   AST 16 02/18/2020   NA 139 02/18/2020   K 4.2 02/18/2020   CL 102 02/18/2020   CREATININE 0.91 02/18/2020   BUN 15 02/18/2020   CO2 27 02/18/2020   TSH 1.89 02/18/2020   HGBA1C 11.1 (H) 02/10/2019  MICROALBUR 1.2 02/18/2020    No results found.   Assessment & Plan:  Plan  I am having Briahna M. Clinton Sawyer start on clotrimazole-betamethasone. I am also having her maintain her Basaglar KwikPen, empagliflozin, azelastine, EPINEPHrine, diphenhydrAMINE, Ozempic (0.25 or 0.5 MG/DOSE), Mirena (52 MG), Contour Next Test, senna, Visine, cetirizine, rosuvastatin, montelukast, ezetimibe, Ciclopirox, linaclotide, NIFEdipine, and spironolactone.  Meds ordered this encounter  Medications  . clotrimazole-betamethasone (LOTRISONE) cream    Sig: Apply 1 application topically 2 (two) times daily.    Dispense:  30 g    Refill:  0    Problem List Items Addressed This Visit   None   Visit Diagnoses    Tinea pedis of both feet    -  Primary   Relevant Medications   clotrimazole-betamethasone (LOTRISONE) cream    if no improvement --- refer to derm   Follow-up: Return if symptoms worsen or fail to improve.  [name], DO

## 2020-07-19 ENCOUNTER — Other Ambulatory Visit: Payer: Self-pay

## 2020-07-19 DIAGNOSIS — E785 Hyperlipidemia, unspecified: Secondary | ICD-10-CM

## 2020-07-19 MED ORDER — EZETIMIBE 10 MG PO TABS: ORAL_TABLET | ORAL | 1 refills | Status: DC

## 2020-07-30 ENCOUNTER — Other Ambulatory Visit: Payer: Self-pay | Admitting: Family Medicine

## 2020-07-30 ENCOUNTER — Other Ambulatory Visit: Payer: Self-pay | Admitting: Allergy

## 2020-07-30 DIAGNOSIS — E785 Hyperlipidemia, unspecified: Secondary | ICD-10-CM

## 2020-07-31 NOTE — Telephone Encounter (Signed)
Called patient to see if she was out of her singular. She stated that the pharmacy requested a refill, but she still has a lot. She will call the office when another refill is needed

## 2020-08-17 ENCOUNTER — Ambulatory Visit: Payer: BC Managed Care – PPO | Admitting: Family Medicine

## 2020-08-17 NOTE — Progress Notes (Incomplete)
Subjective:   By signing my name below, I, Shehryar Baig, attest that this documentation has been prepared under the direction and in the presence of Dr. Seabron Spates, DO. 08/17/2020      Patient ID: Kathleen Jordan, female    DOB: 14-May-1970, 50 y.o.   MRN: [id number]  No chief complaint on file.   HPI Patient is in today for a office visit.   Past Medical History:  Diagnosis Date  . Allergic rhinitis   . Anal fissure 07/15/2017  . Chronic constipation   . Frequency of urination   . GERD (gastroesophageal reflux disease)   . History of palpitations   . Hx of varicella   . Hyperlipidemia   . Hypertension   . Menorrhagia   . OSA (obstructive sleep apnea)    08-07-2017 no cpap due to finanaces  . PCOS (polycystic ovarian syndrome)   . Peripheral neuropathy   . Thyroid goiter   . Type 2 diabetes mellitus with hyperglycemia, with long-term current use of insulin (HCC)   . Urgency of urination     Past Surgical History:  Procedure Laterality Date  . BREAST REDUCTION SURGERY Bilateral 07-18-2003   dr Shon Hough  [facility]  . CHOLECYSTECTOMY N/A 02/15/2019   Procedure: LAPAROSCOPIC CHOLECYSTECTOMY;  Surgeon: Axel Filler, MD;  Location: [facility] OR;  Service: General;  Laterality: N/A;  . COLONOSCOPY WITH PROPOFOL N/A 07/15/2017   Procedure: COLONOSCOPY WITH PROPOFOL;  Surgeon: Hilarie Fredrickson, MD;  Location: [facility];  Service: Endoscopy;  Laterality: N/A;  . DILITATION & CURRETTAGE/HYSTROSCOPY WITH HYDROTHERMAL ABLATION N/A 03/06/2018   Procedure: DILATATION & CURETTAGE/HYSTEROSCOPY WITH HYDROTHERMAL ABLATION, INSERTION OF INTRAUTERINE DEVICE;  Surgeon: Hal Morales, MD;  Location: [facility];  Service: Gynecology;  Laterality: N/A;  . invitro fertilization  11/16/2010   [facility]    Family History  Problem Relation Age of Onset  . Arthritis Mother   . Diabetes Mother   . Hypertension Mother   . Heart disease Mother 39       MI  .  Stroke Mother   . Congestive Heart Failure Mother   . Allergic rhinitis Mother   . Asthma Mother   . Allergic rhinitis Sister   . Diabetes Brother   . Hypertension Brother   . Arthritis Maternal Grandmother   . Diabetes Maternal Grandmother   . Hypertension Maternal Grandmother   . Cancer Maternal Grandmother   . Stroke Maternal Grandfather   . Cancer Maternal Grandfather   . Asthma Sister   . Allergies Sister     Social History   Socioeconomic History  . Marital status: Married    Spouse name: Caryn Bee  . Number of children: 1  . Years of education: Masters  . Highest education level: Not on file  Occupational History  . 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 Smoking status: Passive Smoke Exposure - Never Smoker  . Smokeless tobacco: Never Used  . Tobacco comment: spouse smokes outside the home  Vaping Use  . Vaping Use: Never used  Substance and Sexual Activity  . Alcohol use: Yes    Comment: social  . Drug use: No  . Sexual activity: Yes    Partners: Male    Birth control/protection: None  Other Topics Concern  . Not on file  Social History Narrative   Drinks caffeine 2x a week    Social  Determinants of Health   Financial Resource Strain: Not on file  Food Insecurity: Not on file  Transportation Needs: Not on file  Physical Activity: Not on file  Stress: Not on file  Social Connections: Not on file  Intimate Partner Violence: Not on file    Outpatient Medications Prior to Visit  Medication Sig Dispense Refill  . azelastine (ASTELIN) 0.1 % nasal spray Place 2 sprays into both nostrils 2 (two) times daily. Use in each nostril as directed (Patient taking differently: Place 2 sprays into both nostrils daily as needed for allergies.) 30 mL 2  . cetirizine (ZYRTEC ALLERGY) 10 MG tablet Take 1 tablet (10 mg total) by mouth daily. 30 tablet 11  . Ciclopirox 1 % shampoo  Massage into scalp , leave 5 min and rinse 120 mL 0  . clotrimazole-betamethasone (LOTRISONE) cream Apply 1 application topically 2 (two) times daily. 30 g 0  . diphenhydrAMINE (BENADRYL) 25 MG tablet Take 25 mg by mouth every 6 (six) hours as needed for allergies.    Marland Kitchen empagliflozin (JARDIANCE) 25 MG TABS tablet Take 25 mg by mouth every evening.     Marland Kitchen EPINEPHrine (AUVI-Q) 0.3 mg/0.3 mL IJ SOAJ injection Inject 0.3 mLs (0.3 mg total) into the muscle as needed for anaphylaxis. 1 each 1  . ezetimibe (ZETIA) 10 MG tablet TAKE 1 TABLET(10 MG) BY MOUTH DAILY 90 tablet 1  . glucose blood (CONTOUR NEXT TEST) test strip Check BG 3 times/day.  Dx E11.65    . Insulin Glargine (BASAGLAR KWIKPEN) 100 UNIT/ML SOPN Inject 65 Units into the skin 2 (two) times daily.     Marland Kitchen levonorgestrel (MIRENA, 52 MG,) 20 MCG/24HR IUD Mirena 20 mcg/24 hours (6 yrs) 52 mg intrauterine device  Take 1 device by intrauterine route.    . linaclotide (LINZESS) 290 MCG CAPS capsule Take 1 capsule (290 mcg total) by mouth daily before breakfast. 30 capsule 2  . montelukast (SINGULAIR) 10 MG tablet Take 1 tablet (10 mg total) by mouth at bedtime. 30 tablet 5  . naphazoline-pheniramine (VISINE) 0.025-0.3 % ophthalmic solution Place 1 drop into both eyes 4 (four) times daily as needed for eye irritation or allergies.    Marland Kitchen NIFEdipine (PROCARDIA XL/NIFEDICAL XL) 60 MG 24 hr tablet Take 1 tablet (60 mg total) by mouth daily. 90 tablet 1  . rosuvastatin (CRESTOR) 10 MG tablet TAKE 1 TABLET(10 MG) BY MOUTH AT BEDTIME 30 tablet 2  . Semaglutide,0.25 or 0.5MG /DOS, (OZEMPIC, 0.25 OR 0.5 MG/DOSE,) 2 MG/1.5ML SOPN Ozempic 0.25 mg or 0.5 mg (2 mg/1.5 mL) subcutaneous pen injector    . spironolactone (ALDACTONE) 25 MG tablet TAKE 1 TABLET(25 MG) BY MOUTH DAILY 90 tablet 1   No facility-administered medications prior to visit.    No Known Allergies  ROS     Objective:    Physical Exam Constitutional:      Appearance: Normal appearance.   HENT:     Head: Normocephalic and atraumatic.     Right Ear: External ear normal.     Left Ear: External ear normal.  Eyes:     Extraocular Movements: Extraocular movements intact.     Pupils: Pupils are equal, round, and reactive to light.  Cardiovascular:     Rate and Rhythm: Normal rate and regular rhythm.     Pulses: Normal pulses.     Heart sounds: Normal heart sounds. No murmur heard. No gallop.   Pulmonary:     Effort: Pulmonary effort is normal. No respiratory distress.  Breath sounds: Normal breath sounds. No wheezing or rales.  Skin:    General: Skin is warm and dry.  Neurological:     Mental Status: She is alert and oriented to person, place, and time.  Psychiatric:        Behavior: Behavior normal.     There were no vitals taken for this visit. Wt Readings from Last 3 Encounters:  07/04/20 242 lb 3.2 oz (109.9 kg)  02/18/20 240 lb 9.6 oz (109.1 kg)  11/26/19 242 lb 3.2 oz (109.9 kg)    Diabetic Foot Exam - Simple   No data filed    Lab Results  Component Value Date   WBC 8.9 02/18/2020   HGB 15.2 (H) 02/18/2020   HCT 45.4 02/18/2020   PLT 264.0 02/18/2020   GLUCOSE 171 (H) 02/18/2020   CHOL 200 02/18/2020   TRIG 82.0 02/18/2020   HDL 44.30 02/18/2020   LDLDIRECT 139.3 11/20/2010   LDLCALC 139 (H) 02/18/2020   ALT 22 02/18/2020   AST 16 02/18/2020   NA 139 02/18/2020   K 4.2 02/18/2020   CL 102 02/18/2020   CREATININE 0.91 02/18/2020   BUN 15 02/18/2020   CO2 27 02/18/2020   TSH 1.89 02/18/2020   HGBA1C 11.1 (H) 02/10/2019   MICROALBUR 1.2 02/18/2020    Lab Results  Component Value Date   TSH 1.89 02/18/2020   Lab Results  Component Value Date   WBC 8.9 02/18/2020   HGB 15.2 (H) 02/18/2020   HCT 45.4 02/18/2020   MCV 88.6 02/18/2020   PLT 264.0 02/18/2020   Lab Results  Component Value Date   NA 139 02/18/2020   K 4.2 02/18/2020   CO2 27 02/18/2020   GLUCOSE 171 (H) 02/18/2020   BUN 15 02/18/2020   CREATININE 0.91  02/18/2020   BILITOT 0.4 02/18/2020   ALKPHOS 84 02/18/2020   AST 16 02/18/2020   ALT 22 02/18/2020   PROT 7.6 02/18/2020   ALBUMIN 4.4 02/18/2020   CALCIUM 9.9 02/18/2020   ANIONGAP 11 02/10/2019   GFR 75.31 02/18/2020   Lab Results  Component Value Date   CHOL 200 02/18/2020   Lab Results  Component Value Date   HDL 44.30 02/18/2020   Lab Results  Component Value Date   LDLCALC 139 (H) 02/18/2020   Lab Results  Component Value Date   TRIG 82.0 02/18/2020   Lab Results  Component Value Date   CHOLHDL 5 02/18/2020   Lab Results  Component Value Date   HGBA1C 11.1 (H) 02/10/2019       Assessment & Plan:   Problem List Items Addressed This Visit   None      No orders of the defined types were placed in this encounter.   I, Dr. Seabron Spates, DO, personally preformed the services described in this documentation.  All medical record entries made by the scribe were at my direction and in my presence.  I have reviewed the chart and discharge instructions (if applicable) and agree that the record reflects my personal performance and is accurate and complete. 08/17/2020   I,Shehryar Baig,acting as a scribe for [name], DO.,have documented all relevant documentation on the behalf of [name], DO,as directed by  [name], DO while in the presence of [name], DO.   Shehryar [employer]

## 2020-09-12 ENCOUNTER — Other Ambulatory Visit: Payer: Self-pay | Admitting: Family Medicine

## 2020-09-12 DIAGNOSIS — I10 Essential (primary) hypertension: Secondary | ICD-10-CM

## 2020-09-13 ENCOUNTER — Other Ambulatory Visit: Payer: Self-pay | Admitting: Allergy

## 2020-09-19 ENCOUNTER — Other Ambulatory Visit: Payer: Self-pay

## 2020-09-19 ENCOUNTER — Encounter: Payer: Self-pay | Admitting: Family Medicine

## 2020-09-19 ENCOUNTER — Telehealth (INDEPENDENT_AMBULATORY_CARE_PROVIDER_SITE_OTHER): Payer: BC Managed Care – PPO | Admitting: Family Medicine

## 2020-09-19 DIAGNOSIS — Z20822 Contact with and (suspected) exposure to covid-19: Secondary | ICD-10-CM

## 2020-09-19 NOTE — Progress Notes (Signed)
MyChart Video Visit    Virtual Visit via Video Note   This visit type was conducted due to national recommendations for restrictions regarding the COVID-19 Pandemic (e.g. social distancing) in an effort to limit this patient's exposure and mitigate transmission in our community. This patient is at least at moderate risk for complications without adequate follow up. This format is felt to be most appropriate for this patient at this time. Physical exam was limited by quality of the video and audio technology used for the visit. [name] was able to get the patient set up on a video visit.  Patient location: Home Patient and provider in visit Provider location: Office  I discussed the limitations of evaluation and management by telemedicine and the availability of in person appointments. The patient expressed understanding and agreed to proceed.  Visit Date: 09/19/2020  Today's healthcare provider: [name], DO     Subjective:    Patient ID: Kathleen Jordan, female    DOB: 07/02/1970, 50 y.o.   MRN: [id number]  Chief Complaint  Patient presents with   Covid Exposure    Pt states having negative Covid test and having stuffy nose and diarrhea    HPI Patient is in today for a video visit.  Pt had diarrhea and congestion yesterday.   She was exposed to her mom on Friday who tested positive on Sunday for covid.  Pt symptoms resolved and she feels fine today   no fever , no diarrhea , no sore throat or congestion .    Negative home covid test yesterday.    Past Medical History:  Diagnosis Date   Allergic rhinitis    Anal fissure 07/15/2017   Chronic constipation    Frequency of urination    GERD (gastroesophageal reflux disease)    History of palpitations    Hx of varicella    Hyperlipidemia    Hypertension    Menorrhagia    OSA (obstructive sleep apnea)    08-07-2017 no cpap due to finanaces   PCOS (polycystic ovarian syndrome)    Peripheral  neuropathy    Thyroid goiter    Type 2 diabetes mellitus with hyperglycemia, with long-term current use of insulin [facility])    Urgency of urination     Past Surgical History:  Procedure Laterality Date   BREAST REDUCTION SURGERY Bilateral 07-18-2003   dr Shon Hough  [facility]   CHOLECYSTECTOMY N/A 02/15/2019   Procedure: LAPAROSCOPIC CHOLECYSTECTOMY;  Surgeon: Axel Filler, MD;  Location: [facility] OR;  Service: General;  Laterality: N/A;   COLONOSCOPY WITH PROPOFOL N/A 07/15/2017   Procedure: COLONOSCOPY WITH PROPOFOL;  Surgeon: Hilarie Fredrickson, MD;  Location: [facility];  Service: Endoscopy;  Laterality: N/A;   DILITATION & CURRETTAGE/HYSTROSCOPY WITH HYDROTHERMAL ABLATION N/A 03/06/2018   Procedure: DILATATION & CURETTAGE/HYSTEROSCOPY WITH HYDROTHERMAL ABLATION, INSERTION OF INTRAUTERINE DEVICE;  Surgeon: Hal Morales, MD;  Location: [facility];  Service: Gynecology;  Laterality: N/A;   invitro fertilization  11/16/2010   [facility]    Family History  Problem Relation Age of Onset   Arthritis Mother    Diabetes Mother    Hypertension Mother    Heart disease Mother 53       MI   Stroke Mother    Congestive Heart Failure Mother    Allergic rhinitis Mother    Asthma Mother    Allergic rhinitis Sister    Diabetes Brother    Hypertension Brother    Arthritis Maternal  Grandmother    Diabetes Maternal Grandmother    Hypertension Maternal Grandmother    Cancer Maternal Grandmother    Stroke Maternal Grandfather    Cancer Maternal Grandfather    Asthma Sister    Allergies Sister     Social History   Socioeconomic History   Marital status: Married    Spouse name: [name]   Number of children: 1   Years of education: Masters   Highest education level: Not on file  Occupational History   Occupation: Orthoptistsychotherapist    Employer: [employer]    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 [place]  Tobacco Use   Smoking status: Passive Smoke Exposure - Never  Smoker   Smokeless tobacco: Never   Tobacco comments:    spouse smokes outside the home  Vaping Use   Vaping Use: Never used  Substance and Sexual Activity   Alcohol use: Yes    Comment: social   Drug use: No   Sexual activity: Yes    Partners: Male    Birth control/protection: None  Other Topics Concern   Not on file  Social History Narrative   Drinks caffeine 2x a week    Social Determinants of Corporate investment bankerHealth   Financial Resource Strain: Not on file  Food Insecurity: Not on file  Transportation Needs: Not on file  Physical Activity: Not on file  Stress: Not on file  Social Connections: Not on file  Intimate Partner Violence: Not on file    Outpatient Medications Prior to Visit  Medication Sig Dispense Refill   azelastine (ASTELIN) 0.1 % nasal spray Place 2 sprays into both nostrils 2 (two) times daily. Use in each nostril as directed (Patient taking differently: Place 2 sprays into both nostrils daily as needed for allergies.) 30 mL 2   cetirizine (ZYRTEC ALLERGY) 10 MG tablet Take 1 tablet (10 mg total) by mouth daily. 30 tablet 11   Ciclopirox 1 % shampoo Massage into scalp , leave 5 min and rinse 120 mL 0   clotrimazole-betamethasone (LOTRISONE) cream Apply 1 application topically 2 (two) times daily. 30 g 0   diphenhydrAMINE (BENADRYL) 25 MG tablet Take 25 mg by mouth every 6 (six) hours as needed for allergies.     empagliflozin (JARDIANCE) 25 MG TABS tablet Take 25 mg by mouth every evening.      EPINEPHrine (AUVI-Q) 0.3 mg/0.3 mL IJ SOAJ injection Inject 0.3 mLs (0.3 mg total) into the muscle as needed for anaphylaxis. 1 each 1   ezetimibe (ZETIA) 10 MG tablet TAKE 1 TABLET(10 MG) BY MOUTH DAILY 90 tablet 1   glucose blood (CONTOUR NEXT TEST) test strip Check BG 3 times/day.  Dx E11.65     Insulin Glargine (BASAGLAR KWIKPEN) 100 UNIT/ML SOPN Inject 65 Units into the skin 2 (two) times daily.      levonorgestrel (MIRENA, 52 MG,) 20 MCG/24HR IUD Mirena 20 mcg/24 hours (6 yrs)  52 mg intrauterine device  Take 1 device by intrauterine route.     linaclotide (LINZESS) 290 MCG CAPS capsule Take 1 capsule (290 mcg total) by mouth daily before breakfast. 30 capsule 2   montelukast (SINGULAIR) 10 MG tablet TAKE 1 TABLET(10 MG) BY MOUTH AT BEDTIME 30 tablet 0   naphazoline-pheniramine (VISINE) 0.025-0.3 % ophthalmic solution Place 1 drop into both eyes 4 (four) times daily as needed for eye irritation or allergies.     NIFEdipine (PROCARDIA XL/NIFEDICAL XL) 60 MG 24 hr tablet TAKE 1 TABLET(60 MG) BY MOUTH DAILY 90  tablet 1   rosuvastatin (CRESTOR) 10 MG tablet TAKE 1 TABLET(10 MG) BY MOUTH AT BEDTIME 30 tablet 2   Semaglutide,0.25 or 0.5MG /DOS, (OZEMPIC, 0.25 OR 0.5 MG/DOSE,) 2 MG/1.5ML SOPN Ozempic 0.25 mg or 0.5 mg (2 mg/1.5 mL) subcutaneous pen injector     spironolactone (ALDACTONE) 25 MG tablet TAKE 1 TABLET(25 MG) BY MOUTH DAILY 90 tablet 1   No facility-administered medications prior to visit.    No Known Allergies  Review of Systems  Constitutional:  Negative for chills, fever and malaise/fatigue.  HENT:  Negative for congestion and hearing loss.   Eyes:  Negative for discharge.  Respiratory:  Negative for cough, sputum production and shortness of breath.   Cardiovascular:  Negative for chest pain, palpitations and leg swelling.  Gastrointestinal:  Negative for abdominal pain, blood in stool, constipation, diarrhea, heartburn, nausea and vomiting.  Genitourinary:  Negative for dysuria, frequency, hematuria and urgency.  Musculoskeletal:  Negative for back pain, falls and myalgias.  Skin:  Negative for rash.  Neurological:  Negative for dizziness, sensory change, loss of consciousness, weakness and headaches.  Endo/Heme/Allergies:  Negative for environmental allergies. Does not bruise/bleed easily.  Psychiatric/Behavioral:  Negative for depression and suicidal ideas. The patient is not nervous/anxious and does not have insomnia.       Objective:    Physical  Exam Vitals and nursing note reviewed.  Constitutional:      General: She is not in acute distress.    Appearance: Normal appearance. She is not ill-appearing or toxic-appearing.  Pulmonary:     Effort: Pulmonary effort is normal.  Neurological:     Mental Status: She is alert.  Psychiatric:        Mood and Affect: Mood normal.        Behavior: Behavior normal.        Thought Content: Thought content normal.        Judgment: Judgment normal.    There were no vitals taken for this visit. Wt Readings from Last 3 Encounters:  07/04/20 242 lb 3.2 oz (109.9 kg)  02/18/20 240 lb 9.6 oz (109.1 kg)  11/26/19 242 lb 3.2 oz (109.9 kg)    Diabetic Foot Exam - Simple   No data filed    Lab Results  Component Value Date   WBC 8.9 02/18/2020   HGB 15.2 (H) 02/18/2020   HCT 45.4 02/18/2020   PLT 264.0 02/18/2020   GLUCOSE 171 (H) 02/18/2020   CHOL 200 02/18/2020   TRIG 82.0 02/18/2020   HDL 44.30 02/18/2020   LDLDIRECT 139.3 11/20/2010   LDLCALC 139 (H) 02/18/2020   ALT 22 02/18/2020   AST 16 02/18/2020   NA 139 02/18/2020   K 4.2 02/18/2020   CL 102 02/18/2020   CREATININE 0.91 02/18/2020   BUN 15 02/18/2020   CO2 27 02/18/2020   TSH 1.89 02/18/2020   HGBA1C 11.1 (H) 02/10/2019   MICROALBUR 1.2 02/18/2020    Lab Results  Component Value Date   TSH 1.89 02/18/2020   Lab Results  Component Value Date   WBC 8.9 02/18/2020   HGB 15.2 (H) 02/18/2020   HCT 45.4 02/18/2020   MCV 88.6 02/18/2020   PLT 264.0 02/18/2020   Lab Results  Component Value Date   NA 139 02/18/2020   K 4.2 02/18/2020   CO2 27 02/18/2020   GLUCOSE 171 (H) 02/18/2020   BUN 15 02/18/2020   CREATININE 0.91 02/18/2020   BILITOT 0.4 02/18/2020   ALKPHOS 84 02/18/2020  AST 16 02/18/2020   ALT 22 02/18/2020   PROT 7.6 02/18/2020   ALBUMIN 4.4 02/18/2020   CALCIUM 9.9 02/18/2020   ANIONGAP 11 02/10/2019   GFR 75.31 02/18/2020   Lab Results  Component Value Date   CHOL 200 02/18/2020    Lab Results  Component Value Date   HDL 44.30 02/18/2020   Lab Results  Component Value Date   LDLCALC 139 (H) 02/18/2020   Lab Results  Component Value Date   TRIG 82.0 02/18/2020   Lab Results  Component Value Date   CHOLHDL 5 02/18/2020   Lab Results  Component Value Date   HGBA1C 11.1 (H) 02/10/2019       Assessment & Plan:   Problem List Items Addressed This Visit       Unprioritized   Close exposure to COVID-19 virus - Primary    No symptoms  Pt will wear a mask when she goes not work or store After 1 week she should be ok to take mask off if she develops no symptoms  If she develops any symptoms she will quarantine and re test          No orders of the defined types were placed in this encounter.   I discussed the assessment and treatment plan with the patient. The patient was provided an opportunity to ask questions and all were answered. The patient agreed with the plan and demonstrated an understanding of the instructions.   The patient was advised to call back or seek an in-person evaluation if the symptoms worsen or if the condition fails to improve as anticipated.  I provided 20 minutes of face-to-face time during this encounter.   [name], DO [facility] at [employer] [phone] (phone) [phone] (fax)  [facility]

## 2020-09-19 NOTE — Assessment & Plan Note (Signed)
No symptoms  Pt will wear a mask when she goes not work or store After 1 week she should be ok to take mask off if she develops no symptoms  If she develops any symptoms she will quarantine and re test

## 2020-09-20 ENCOUNTER — Encounter: Payer: Self-pay | Admitting: Family Medicine

## 2020-09-21 ENCOUNTER — Ambulatory Visit: Payer: BC Managed Care – PPO | Admitting: Family Medicine

## 2020-10-06 ENCOUNTER — Other Ambulatory Visit: Payer: Self-pay

## 2020-10-06 ENCOUNTER — Encounter: Payer: Self-pay | Admitting: Family Medicine

## 2020-10-06 ENCOUNTER — Ambulatory Visit: Payer: BC Managed Care – PPO | Admitting: Family Medicine

## 2020-10-06 VITALS — BP 102/60 | HR 81 | Temp 99.2°F | Resp 18 | Ht 68.5 in | Wt 238.8 lb

## 2020-10-06 DIAGNOSIS — L02416 Cutaneous abscess of left lower limb: Secondary | ICD-10-CM | POA: Diagnosis not present

## 2020-10-06 MED ORDER — DOXYCYCLINE HYCLATE 100 MG PO TABS: 100.0000 mg | ORAL_TABLET | Freq: Two times a day (BID) | ORAL | 0 refills | Status: DC

## 2020-10-06 NOTE — Patient Instructions (Signed)
Keep area clean and dry Can use antibiotic ointment on it if necessary Finish antibiotics

## 2020-10-06 NOTE — Progress Notes (Signed)
Patient ID: Kathleen Jordan, female    DOB: June 23, 1970  Age: 50 y.o. MRN: [id number]    Subjective:  Subjective  HPI Kathleen Jordan presents for an office visit today. She complains of sore on her L leg. Pt has present Mhx of T2DM. Pt is unaware if the sore has drainage. She notes that the area is painful when touched. The core of the sore was removed at the office.  She denies any chest pain, SOB, fever, abdominal pain, cough, chills, sore throat, dysuria, urinary incontinence, back pain, HA, or N/V/D at this time.   Review of Systems  Constitutional:  Negative for chills, fatigue and fever.  HENT:  Negative for ear pain, rhinorrhea, sinus pressure, sinus pain, sore throat and tinnitus.   Eyes:  Negative for pain.  Respiratory:  Negative for cough, shortness of breath and wheezing.   Cardiovascular:  Negative for chest pain.  Gastrointestinal:  Negative for abdominal pain, anal bleeding, constipation, diarrhea, nausea and vomiting.  Genitourinary:  Negative for flank pain.  Musculoskeletal:  Negative for back pain and neck pain.  Skin:  Negative for rash and wound.       (+) sore on L leg     Neurological:  Negative for seizures, weakness, light-headedness, numbness and headaches.   History Past Medical History:  Diagnosis Date   Allergic rhinitis    Anal fissure 07/15/2017   Chronic constipation    Frequency of urination    GERD (gastroesophageal reflux disease)    History of palpitations    Hx of varicella    Hyperlipidemia    Hypertension    Menorrhagia    OSA (obstructive sleep apnea)    08-07-2017 no cpap due to finanaces   PCOS (polycystic ovarian syndrome)    Peripheral neuropathy    Thyroid goiter    Type 2 diabetes mellitus with hyperglycemia, with long-term current use of insulin (HCC)    Urgency of urination     She has a past surgical history that includes Breast reduction surgery (Bilateral, 07-18-2003   dr Shon Hough  [facility]); invitro  fertilization (11/16/2010); Colonoscopy with propofol (N/A, 07/15/2017); Dilatation & currettage/hysteroscopy with hydrothermal ablation (N/A, 03/06/2018); and Cholecystectomy (N/A, 02/15/2019).   Her family history includes Allergic rhinitis in her mother and sister; Allergies in her sister; Arthritis in her maternal grandmother and mother; Asthma in her mother and sister; Cancer in her maternal grandfather and maternal grandmother; Congestive Heart Failure in her mother; Diabetes in her brother, maternal grandmother, and mother; Heart disease (age of onset: 22) in her mother; Hypertension in her brother, maternal grandmother, and mother; Stroke in her maternal grandfather and mother.She reports that she has never smoked. She has been exposed to tobacco smoke. She has never used smokeless tobacco. She reports current alcohol use. She reports that she does not use drugs.  Current Outpatient Medications on File Prior to Visit  Medication Sig Dispense Refill   azelastine (ASTELIN) 0.1 % nasal spray Place 2 sprays into both nostrils 2 (two) times daily. Use in each nostril as directed (Patient taking differently: Place 2 sprays into both nostrils daily as needed for allergies.) 30 mL 2   cetirizine (ZYRTEC ALLERGY) 10 MG tablet Take 1 tablet (10 mg total) by mouth daily. 30 tablet 11   Ciclopirox 1 % shampoo Massage into scalp , leave 5 min and rinse 120 mL 0   clotrimazole-betamethasone (LOTRISONE) cream Apply 1 application topically 2 (two) times daily. 30 g 0   diphenhydrAMINE (BENADRYL) 25  MG tablet Take 25 mg by mouth every 6 (six) hours as needed for allergies.     empagliflozin (JARDIANCE) 25 MG TABS tablet Take 25 mg by mouth every evening.      EPINEPHrine (AUVI-Q) 0.3 mg/0.3 mL IJ SOAJ injection Inject 0.3 mLs (0.3 mg total) into the muscle as needed for anaphylaxis. 1 each 1   ezetimibe (ZETIA) 10 MG tablet TAKE 1 TABLET(10 MG) BY MOUTH DAILY 90 tablet 1   glucose blood (CONTOUR NEXT TEST) test  strip Check BG 3 times/day.  Dx E11.65     Insulin Glargine (BASAGLAR KWIKPEN) 100 UNIT/ML SOPN Inject 65 Units into the skin 2 (two) times daily.      levonorgestrel (MIRENA, 52 MG,) 20 MCG/24HR IUD Mirena 20 mcg/24 hours (6 yrs) 52 mg intrauterine device  Take 1 device by intrauterine route.     linaclotide (LINZESS) 290 MCG CAPS capsule Take 1 capsule (290 mcg total) by mouth daily before breakfast. 30 capsule 2   montelukast (SINGULAIR) 10 MG tablet TAKE 1 TABLET(10 MG) BY MOUTH AT BEDTIME 30 tablet 0   naphazoline-pheniramine (VISINE) 0.025-0.3 % ophthalmic solution Place 1 drop into both eyes 4 (four) times daily as needed for eye irritation or allergies.     NIFEdipine (PROCARDIA XL/NIFEDICAL XL) 60 MG 24 hr tablet TAKE 1 TABLET(60 MG) BY MOUTH DAILY 90 tablet 1   rosuvastatin (CRESTOR) 10 MG tablet TAKE 1 TABLET(10 MG) BY MOUTH AT BEDTIME 30 tablet 2   Semaglutide,0.25 or 0.5MG /DOS, (OZEMPIC, 0.25 OR 0.5 MG/DOSE,) 2 MG/1.5ML SOPN Ozempic 0.25 mg or 0.5 mg (2 mg/1.5 mL) subcutaneous pen injector     spironolactone (ALDACTONE) 25 MG tablet TAKE 1 TABLET(25 MG) BY MOUTH DAILY 90 tablet 1   No current facility-administered medications on file prior to visit.     Objective:  Objective  Physical Exam Vitals and nursing note reviewed.  Constitutional:      General: She is not in acute distress.    Appearance: Normal appearance. She is well-developed. She is not ill-appearing.  HENT:     Head: Normocephalic and atraumatic.     Right Ear: External ear normal.     Left Ear: External ear normal.     Nose: Nose normal.  Eyes:     General:        Right eye: No discharge.        Left eye: No discharge.     Extraocular Movements: Extraocular movements intact.     Pupils: Pupils are equal, round, and reactive to light.  Cardiovascular:     Rate and Rhythm: Normal rate and regular rhythm.     Pulses: Normal pulses.     Heart sounds: Normal heart sounds. No murmur heard.   No friction rub.  No gallop.  Pulmonary:     Effort: Pulmonary effort is normal. No respiratory distress.     Breath sounds: Normal breath sounds. No stridor. No wheezing, rhonchi or rales.  Chest:     Chest wall: No tenderness.  Abdominal:     General: Bowel sounds are normal. There is no distension.     Palpations: Abdomen is soft. There is no mass.     Tenderness: There is no abdominal tenderness. There is no guarding or rebound.     Hernia: No hernia is present.  Musculoskeletal:        General: Normal range of motion.     Cervical back: Normal range of motion and neck supple.  Right lower leg: No edema.     Left lower leg: No edema.  Skin:    General: Skin is warm and dry.     Findings: Lesion present.     Comments: There is a papule with a head on it---  tender to touch and able to express some yellow pus and core Abx ointment and bandaid in place   Neurological:     Mental Status: She is alert and oriented to person, place, and time.  Psychiatric:        Behavior: Behavior normal.        Thought Content: Thought content normal.   BP 102/60 (BP Location: Right Arm, Patient Position: Sitting, Cuff Size: Large)   Pulse 81   Temp 99.2 F (37.3 C) (Oral)   Resp 18   Ht 5' 8.5" (1.74 m)   Wt 238 lb 12.8 oz (108.3 kg)   SpO2 97%   BMI 35.78 kg/m  Wt Readings from Last 3 Encounters:  10/06/20 238 lb 12.8 oz (108.3 kg)  07/04/20 242 lb 3.2 oz (109.9 kg)  02/18/20 240 lb 9.6 oz (109.1 kg)     Lab Results  Component Value Date   WBC 8.9 02/18/2020   HGB 15.2 (H) 02/18/2020   HCT 45.4 02/18/2020   PLT 264.0 02/18/2020   GLUCOSE 171 (H) 02/18/2020   CHOL 200 02/18/2020   TRIG 82.0 02/18/2020   HDL 44.30 02/18/2020   LDLDIRECT 139.3 11/20/2010   LDLCALC 139 (H) 02/18/2020   ALT 22 02/18/2020   AST 16 02/18/2020   NA 139 02/18/2020   K 4.2 02/18/2020   CL 102 02/18/2020   CREATININE 0.91 02/18/2020   BUN 15 02/18/2020   CO2 27 02/18/2020   TSH 1.89 02/18/2020   HGBA1C 11.1  (H) 02/10/2019   MICROALBUR 1.2 02/18/2020    No results found.   Assessment & Plan:  Plan   Meds ordered this encounter  Medications   doxycycline (VIBRA-TABS) 100 MG tablet    Sig: Take 1 tablet (100 mg total) by mouth 2 (two) times daily.    Dispense:  20 tablet    Refill:  0    Problem List Items Addressed This Visit   None Visit Diagnoses     Abscess of left lower extremity    -  Primary   Relevant Medications   doxycycline (VIBRA-TABS) 100 MG tablet     Abx ointment and bandaid placed If it does not heal -- consider derm referral   Follow-up: Return if symptoms worsen or fail to improve.   I,Gordon Zheng,acting as a Neurosurgeon for [employer], DO.,have documented all relevant documentation on the behalf of [name], DO,as directed by  [name], DO while in the presence of [name], DO.  I, [name], DO, have reviewed all documentation for this visit. The documentation on 10/06/20 for the exam, diagnosis, procedures, and orders are all accurate and complete.

## 2020-11-21 ENCOUNTER — Encounter: Payer: Self-pay | Admitting: Family Medicine

## 2020-11-24 NOTE — Telephone Encounter (Signed)
fyi

## 2020-12-22 ENCOUNTER — Telehealth (INDEPENDENT_AMBULATORY_CARE_PROVIDER_SITE_OTHER): Payer: BC Managed Care – PPO | Admitting: Family Medicine

## 2020-12-22 ENCOUNTER — Encounter: Payer: Self-pay | Admitting: Family Medicine

## 2020-12-22 ENCOUNTER — Other Ambulatory Visit: Payer: Self-pay

## 2020-12-22 DIAGNOSIS — J0140 Acute pansinusitis, unspecified: Secondary | ICD-10-CM | POA: Diagnosis not present

## 2020-12-22 MED ORDER — PROMETHAZINE-DM 6.25-15 MG/5ML PO SYRP
5.0000 mL | ORAL_SOLUTION | Freq: Four times a day (QID) | ORAL | 0 refills | Status: DC | PRN
Start: 2020-12-22 — End: 2021-02-06

## 2020-12-22 MED ORDER — DOXYCYCLINE HYCLATE 100 MG PO TABS: 100.0000 mg | ORAL_TABLET | Freq: Two times a day (BID) | ORAL | 0 refills | Status: DC

## 2020-12-22 NOTE — Patient Instructions (Signed)

## 2020-12-22 NOTE — Progress Notes (Signed)
MyChart Video Visit    Virtual Visit via Video Note   This visit type was conducted due to national recommendations for restrictions regarding the COVID-19 Pandemic (e.g. social distancing) in an effort to limit this patient's exposure and mitigate transmission in our community. This patient is at least at moderate risk for complications without adequate follow up. This format is felt to be most appropriate for this patient at this time. Physical exam was limited by quality of the video and audio technology used for the visit. [name] was able to get the patient set up on a video visit.  Patient location: Home Patient and provider in visit Provider location: Office  I discussed the limitations of evaluation and management by telemedicine and the availability of in person appointments. The patient expressed understanding and agreed to proceed.  Visit Date: 12/22/2020  Today's healthcare provider: [name], DO      Subjective:    Patient ID: Kathleen Jordan, female    DOB: January 19, 1971, 50 y.o.   MRN: [id number]  Chief Complaint  Patient presents with   COVID   Follow-up    HPI Patient is in today for a video visit.  She tested positive for Covid-19 on 11/19/2020 and took all the necessary medication.  She started having symptoms on Sunday night and tested for Covid-19. It was negative and she suspects it is a sinus infection. She has been having congestion and cough that she has been treating with mucinex. She mentions she has not been able to sleep for the past couple of nights and the mucinex started being effective today. She has also been using Afrin nasal spray very frequently because it is the only spray that works.   Past Medical History:  Diagnosis Date   Allergic rhinitis    Anal fissure 07/15/2017   Chronic constipation    Frequency of urination    GERD (gastroesophageal reflux disease)    History of palpitations    Hx of  varicella    Hyperlipidemia    Hypertension    Menorrhagia    OSA (obstructive sleep apnea)    08-07-2017 no cpap due to finanaces   PCOS (polycystic ovarian syndrome)    Peripheral neuropathy    Thyroid goiter    Type 2 diabetes mellitus with hyperglycemia, with long-term current use of insulin [facility])    Urgency of urination     Past Surgical History:  Procedure Laterality Date   BREAST REDUCTION SURGERY Bilateral 07-18-2003   dr Shon Hough  [facility]   CHOLECYSTECTOMY N/A 02/15/2019   Procedure: LAPAROSCOPIC CHOLECYSTECTOMY;  Surgeon: Axel Filler, MD;  Location: [facility] OR;  Service: General;  Laterality: N/A;   COLONOSCOPY WITH PROPOFOL N/A 07/15/2017   Procedure: COLONOSCOPY WITH PROPOFOL;  Surgeon: Hilarie Fredrickson, MD;  Location: [facility];  Service: Endoscopy;  Laterality: N/A;   DILITATION & CURRETTAGE/HYSTROSCOPY WITH HYDROTHERMAL ABLATION N/A 03/06/2018   Procedure: DILATATION & CURETTAGE/HYSTEROSCOPY WITH HYDROTHERMAL ABLATION, INSERTION OF INTRAUTERINE DEVICE;  Surgeon: Hal Morales, MD;  Location: [facility];  Service: Gynecology;  Laterality: N/A;   invitro fertilization  11/16/2010   [facility]    Family History  Problem Relation Age of Onset   Arthritis Mother    Diabetes Mother    Hypertension Mother    Heart disease Mother 74       MI   Stroke Mother    Congestive Heart Failure Mother    Allergic rhinitis Mother  Asthma Mother    Allergic rhinitis Sister    Diabetes Brother    Hypertension Brother    Arthritis Maternal Grandmother    Diabetes Maternal Grandmother    Hypertension Maternal Grandmother    Cancer Maternal Grandmother    Stroke Maternal Grandfather    Cancer Maternal Grandfather    Asthma Sister    Allergies Sister     Social History   Socioeconomic History   Marital status: Married    Spouse name: Caryn Bee   Number of children: 1   Years of education: Masters   Highest education level: Not on file  Occupational  History   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 [place]  Tobacco Use   Smoking status: Never    Passive exposure: Yes   Smokeless tobacco: Never   Tobacco comments:    spouse smokes outside the home  Vaping Use   Vaping Use: Never used  Substance and Sexual Activity   Alcohol use: Yes    Comment: social   Drug use: No   Sexual activity: Yes    Partners: Male    Birth control/protection: None  Other Topics Concern   Not on file  Social History Narrative   Drinks caffeine 2x a week    Social Determinants of Corporate investment banker Strain: Not on file  Food Insecurity: Not on file  Transportation Needs: Not on file  Physical Activity: Not on file  Stress: Not on file  Social Connections: Not on file  Intimate Partner Violence: Not on file    Outpatient Medications Prior to Visit  Medication Sig Dispense Refill   azelastine (ASTELIN) 0.1 % nasal spray Place 2 sprays into both nostrils 2 (two) times daily. Use in each nostril as directed (Patient taking differently: Place 2 sprays into both nostrils daily as needed for allergies.) 30 mL 2   cetirizine (ZYRTEC ALLERGY) 10 MG tablet Take 1 tablet (10 mg total) by mouth daily. 30 tablet 11   Ciclopirox 1 % shampoo Massage into scalp , leave 5 min and rinse 120 mL 0   clotrimazole-betamethasone (LOTRISONE) cream Apply 1 application topically 2 (two) times daily. 30 g 0   diphenhydrAMINE (BENADRYL) 25 MG tablet Take 25 mg by mouth every 6 (six) hours as needed for allergies.     doxycycline (VIBRA-TABS) 100 MG tablet Take 1 tablet (100 mg total) by mouth 2 (two) times daily. 20 tablet 0   empagliflozin (JARDIANCE) 25 MG TABS tablet Take 25 mg by mouth every evening.      EPINEPHrine (AUVI-Q) 0.3 mg/0.3 mL IJ SOAJ injection Inject 0.3 mLs (0.3 mg total) into the muscle as needed for anaphylaxis. 1 each 1   ezetimibe (ZETIA) 10 MG tablet TAKE 1 TABLET(10 MG) BY  MOUTH DAILY 90 tablet 1   glucose blood (CONTOUR NEXT TEST) test strip Check BG 3 times/day.  Dx E11.65     Insulin Glargine (BASAGLAR KWIKPEN) 100 UNIT/ML SOPN Inject 65 Units into the skin 2 (two) times daily.      levonorgestrel (MIRENA, 52 MG,) 20 MCG/24HR IUD Mirena 20 mcg/24 hours (6 yrs) 52 mg intrauterine device  Take 1 device by intrauterine route.     linaclotide (LINZESS) 290 MCG CAPS capsule Take 1 capsule (290 mcg total) by mouth daily before breakfast. 30 capsule 2   montelukast (SINGULAIR) 10 MG tablet TAKE 1 TABLET(10 MG) BY MOUTH AT BEDTIME 30 tablet 0  naphazoline-pheniramine (VISINE) 0.025-0.3 % ophthalmic solution Place 1 drop into both eyes 4 (four) times daily as needed for eye irritation or allergies.     NIFEdipine (PROCARDIA XL/NIFEDICAL XL) 60 MG 24 hr tablet TAKE 1 TABLET(60 MG) BY MOUTH DAILY 90 tablet 1   rosuvastatin (CRESTOR) 10 MG tablet TAKE 1 TABLET(10 MG) BY MOUTH AT BEDTIME 30 tablet 2   Semaglutide,0.25 or 0.5MG /DOS, (OZEMPIC, 0.25 OR 0.5 MG/DOSE,) 2 MG/1.5ML SOPN Ozempic 0.25 mg or 0.5 mg (2 mg/1.5 mL) subcutaneous pen injector     spironolactone (ALDACTONE) 25 MG tablet TAKE 1 TABLET(25 MG) BY MOUTH DAILY 90 tablet 1   No facility-administered medications prior to visit.    No Known Allergies  Review of Systems  Constitutional:  Negative for fever and malaise/fatigue.  HENT:  Positive for congestion.   Eyes:  Negative for blurred vision.  Respiratory:  Positive for cough. Negative for shortness of breath.   Cardiovascular:  Negative for chest pain, palpitations and leg swelling.  Gastrointestinal:  Negative for abdominal pain, blood in stool and nausea.  Genitourinary:  Negative for dysuria and frequency.  Musculoskeletal:  Negative for falls.  Skin:  Negative for rash.  Neurological:  Negative for dizziness, loss of consciousness and headaches.  Endo/Heme/Allergies:  Negative for environmental allergies.  Psychiatric/Behavioral:  Negative for  depression. The patient is not nervous/anxious.       Objective:    Physical Exam Vitals and nursing note reviewed.  Constitutional:      Appearance: Normal appearance. She is well-developed.  HENT:     Head: Normocephalic and atraumatic.  Neck:     Thyroid: No thyromegaly.     Vascular: No JVD.  Pulmonary:     Effort: Pulmonary effort is normal. No respiratory distress.  Neurological:     Mental Status: She is alert and oriented to person, place, and time.  Psychiatric:        Mood and Affect: Mood normal.        Behavior: Behavior normal.        Thought Content: Thought content normal.        Judgment: Judgment normal.    There were no vitals taken for this visit. Wt Readings from Last 3 Encounters:  10/06/20 238 lb 12.8 oz (108.3 kg)  07/04/20 242 lb 3.2 oz (109.9 kg)  02/18/20 240 lb 9.6 oz (109.1 kg)    Diabetic Foot Exam - Simple   No data filed    Lab Results  Component Value Date   WBC 8.9 02/18/2020   HGB 15.2 (H) 02/18/2020   HCT 45.4 02/18/2020   PLT 264.0 02/18/2020   GLUCOSE 171 (H) 02/18/2020   CHOL 200 02/18/2020   TRIG 82.0 02/18/2020   HDL 44.30 02/18/2020   LDLDIRECT 139.3 11/20/2010   LDLCALC 139 (H) 02/18/2020   ALT 22 02/18/2020   AST 16 02/18/2020   NA 139 02/18/2020   K 4.2 02/18/2020   CL 102 02/18/2020   CREATININE 0.91 02/18/2020   BUN 15 02/18/2020   CO2 27 02/18/2020   TSH 1.89 02/18/2020   HGBA1C 11.1 (H) 02/10/2019   MICROALBUR 1.2 02/18/2020    Lab Results  Component Value Date   TSH 1.89 02/18/2020   Lab Results  Component Value Date   WBC 8.9 02/18/2020   HGB 15.2 (H) 02/18/2020   HCT 45.4 02/18/2020   MCV 88.6 02/18/2020   PLT 264.0 02/18/2020   Lab Results  Component Value Date   NA 139 02/18/2020  K 4.2 02/18/2020   CO2 27 02/18/2020   GLUCOSE 171 (H) 02/18/2020   BUN 15 02/18/2020   CREATININE 0.91 02/18/2020   BILITOT 0.4 02/18/2020   ALKPHOS 84 02/18/2020   AST 16 02/18/2020   ALT 22 02/18/2020    PROT 7.6 02/18/2020   ALBUMIN 4.4 02/18/2020   CALCIUM 9.9 02/18/2020   ANIONGAP 11 02/10/2019   GFR 75.31 02/18/2020   Lab Results  Component Value Date   CHOL 200 02/18/2020   Lab Results  Component Value Date   HDL 44.30 02/18/2020   Lab Results  Component Value Date   LDLCALC 139 (H) 02/18/2020   Lab Results  Component Value Date   TRIG 82.0 02/18/2020   Lab Results  Component Value Date   CHOLHDL 5 02/18/2020   Lab Results  Component Value Date   HGBA1C 11.1 (H) 02/10/2019       Assessment & Plan:   Problem List Items Addressed This Visit       Unprioritized   Acute non-recurrent pansinusitis - Primary   Relevant Medications   doxycycline (VIBRA-TABS) 100 MG tablet   promethazine-dextromethorphan (PROMETHAZINE-DM) 6.25-15 MG/5ML syrup     Meds ordered this encounter  Medications   doxycycline (VIBRA-TABS) 100 MG tablet    Sig: Take 1 tablet (100 mg total) by mouth 2 (two) times daily.    Dispense:  20 tablet    Refill:  0   promethazine-dextromethorphan (PROMETHAZINE-DM) 6.25-15 MG/5ML syrup    Sig: Take 5 mLs by mouth 4 (four) times daily as needed.    Dispense:  118 mL    Refill:  0    I discussed the assessment and treatment plan with the patient. The patient was provided an opportunity to ask questions and all were answered. The patient agreed with the plan and demonstrated an understanding of the instructions.   The patient was advised to call back or seek an in-person evaluation if the symptoms worsen or if the condition fails to improve as anticipated.  I provided 20 minutes of face-to-face time during this encounter.   I,[name],acting as a Neurosurgeon for [employer], DO.,have documented all relevant documentation on the behalf of [name], DO,as directed by  [name], DO while in the presence of [name], DO.   I, [name], DO, have reviewed all documentation for this visit. The  documentation on 12/22/20 for the exam, diagnosis, procedures, and orders are all accurate and complete.    [name], DO [facility] [phone] (phone) [phone] (fax)  [facility]

## 2021-02-05 ENCOUNTER — Telehealth: Payer: Self-pay | Admitting: Family Medicine

## 2021-02-05 NOTE — Telephone Encounter (Signed)
Reviewing schedule and noticed pt scheduled for blurred vision and headaches. Called pt and transferred to triage for further advice.

## 2021-02-05 NOTE — Telephone Encounter (Signed)
Noted  

## 2021-02-06 ENCOUNTER — Encounter: Payer: Self-pay | Admitting: Family Medicine

## 2021-02-06 ENCOUNTER — Ambulatory Visit (INDEPENDENT_AMBULATORY_CARE_PROVIDER_SITE_OTHER): Payer: BC Managed Care – PPO | Admitting: Family Medicine

## 2021-02-06 ENCOUNTER — Other Ambulatory Visit: Payer: Self-pay

## 2021-02-06 VITALS — BP 134/70 | HR 86 | Temp 98.4°F | Ht 68.5 in | Wt 240.2 lb

## 2021-02-06 DIAGNOSIS — E1165 Type 2 diabetes mellitus with hyperglycemia: Secondary | ICD-10-CM | POA: Diagnosis not present

## 2021-02-06 DIAGNOSIS — H538 Other visual disturbances: Secondary | ICD-10-CM | POA: Diagnosis not present

## 2021-02-06 DIAGNOSIS — I10 Essential (primary) hypertension: Secondary | ICD-10-CM | POA: Diagnosis not present

## 2021-02-06 LAB — GLUCOSE, POCT (MANUAL RESULT ENTRY): POC Glucose: 316 mg/dl — AB (ref 70–99)

## 2021-02-06 LAB — POC URINALSYSI DIPSTICK (AUTOMATED)
Bilirubin, UA: NEGATIVE
Blood, UA: NEGATIVE
Glucose, UA: POSITIVE — AB
Leukocytes, UA: NEGATIVE
Nitrite, UA: NEGATIVE
Protein, UA: NEGATIVE
Spec Grav, UA: 1.015 (ref 1.010–1.025)
Urobilinogen, UA: 0.2 E.U./dL
pH, UA: 5 (ref 5.0–8.0)

## 2021-02-06 NOTE — Progress Notes (Signed)
Subjective:   By signing my name below, I, Shehryar Baig, attest that this documentation has been prepared under the direction and in the presence of Dr. Roma Schanz, DO. 02/06/2021    Patient ID: Kathleen Jordan, female    DOB: July 15, 1970, 50 y.o.   MRN: [id number]  Chief Complaint  Patient presents with   Blurred Vision    With pain in groin area. Going on since aug  Worse the last 2 weeks   Headache    HPI Patient is in today for a office visit.   Vision- She continues having blurry vision since last September. She also has more frequent headaches and notes having eye pain while having them. She has no issues with sound. She occasionally takes tylenol to manage her headaches but only finds mild relief. She has not tried taking sinus medication. She denies having any fevers. She has seen a vision specialist and was prescribed reading glasses. She reports being in front of a computer screen for long periods due to completing online schooling.   Blood sugar- She reports her blood sugars are running high during a recent visit with another provider. She reports her last A1c was 9.3. She reports her blood sugars this morning was 223 while fasting. She recently switched to tresiba from basalglar and reports less improvement than her previous medication. She continues taking trulicity once weekly, 25 mg jardiance daily PO and reports no new issues while taking them. Her blood sugar measured 316 during this visit.    Past Medical History:  Diagnosis Date   Allergic rhinitis    Anal fissure 07/15/2017   Chronic constipation    Frequency of urination    GERD (gastroesophageal reflux disease)    History of palpitations    Hx of varicella    Hyperlipidemia    Hypertension    Menorrhagia    OSA (obstructive sleep apnea)    08-07-2017 no cpap due to finanaces   PCOS (polycystic ovarian syndrome)    Peripheral neuropathy    Thyroid goiter    Type 2 diabetes mellitus with  hyperglycemia, with long-term current use of insulin [facility]    Urgency of urination     Past Surgical History:  Procedure Laterality Date   BREAST REDUCTION SURGERY Bilateral 07-18-2003   dr Towanda Malkin  [facility]   CHOLECYSTECTOMY N/A 02/15/2019   Procedure: LAPAROSCOPIC CHOLECYSTECTOMY;  Surgeon: Ralene Ok, MD;  Location: [facility];  Service: General;  Laterality: N/A;   COLONOSCOPY WITH PROPOFOL N/A 07/15/2017   Procedure: COLONOSCOPY WITH PROPOFOL;  Surgeon: Irene Shipper, MD;  Location: [facility];  Service: Endoscopy;  Laterality: N/A;   DILITATION & CURRETTAGE/HYSTROSCOPY WITH HYDROTHERMAL ABLATION N/A 03/06/2018   Procedure: DILATATION & CURETTAGE/HYSTEROSCOPY WITH HYDROTHERMAL ABLATION, INSERTION OF INTRAUTERINE DEVICE;  Surgeon: Eldred Manges, MD;  Location: [facility];  Service: Gynecology;  Laterality: N/A;   invitro fertilization  11/16/2010   [facility]    Family History  Problem Relation Age of Onset   Arthritis Mother    Diabetes Mother    Hypertension Mother    Heart disease Mother 29       MI   Stroke Mother    Congestive Heart Failure Mother    Allergic rhinitis Mother    Asthma Mother    Allergic rhinitis Sister    Diabetes Brother    Hypertension Brother    Arthritis Maternal Grandmother    Diabetes Maternal Grandmother    Hypertension Maternal Grandmother  Cancer Maternal Grandmother    Stroke Maternal Grandfather    Cancer Maternal Grandfather    Asthma Sister    Allergies Sister     Social History   Socioeconomic History   Marital status: Married    Spouse name: Lennette Bihari   Number of children: 1   Years of education: Masters   Highest education level: Not on file  Occupational History   Occupation: Water quality scientist: [employer]    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 [place]  Tobacco Use   Smoking status: Never    Passive exposure: Yes   Smokeless tobacco: Never   Tobacco comments:    spouse smokes  outside the home  Vaping Use   Vaping Use: Never used  Substance and Sexual Activity   Alcohol use: Yes    Comment: social   Drug use: No   Sexual activity: Yes    Partners: Male    Birth control/protection: None  Other Topics Concern   Not on file  Social History Narrative   Drinks caffeine 2x a week    Social Determinants of Radio broadcast assistant Strain: Not on file  Food Insecurity: Not on file  Transportation Needs: Not on file  Physical Activity: Not on file  Stress: Not on file  Social Connections: Not on file  Intimate Partner Violence: Not on file    Outpatient Medications Prior to Visit  Medication Sig Dispense Refill   azelastine (ASTELIN) 0.1 % nasal spray Place 2 sprays into both nostrils 2 (two) times daily. Use in each nostril as directed (Patient taking differently: Place 2 sprays into both nostrils daily as needed for allergies.) 30 mL 2   cetirizine (ZYRTEC ALLERGY) 10 MG tablet Take 1 tablet (10 mg total) by mouth daily. 30 tablet 11   Ciclopirox 1 % shampoo Massage into scalp , leave 5 min and rinse 120 mL 0   diphenhydrAMINE (BENADRYL) 25 MG tablet Take 25 mg by mouth every 6 (six) hours as needed for allergies.     empagliflozin (JARDIANCE) 25 MG TABS tablet Take 25 mg by mouth every evening.      EPINEPHrine (AUVI-Q) 0.3 mg/0.3 mL IJ SOAJ injection Inject 0.3 mLs (0.3 mg total) into the muscle as needed for anaphylaxis. 1 each 1   ezetimibe (ZETIA) 10 MG tablet TAKE 1 TABLET(10 MG) BY MOUTH DAILY 90 tablet 1   glucose blood (CONTOUR NEXT TEST) test strip Check BG 3 times/day.  Dx E11.65     levonorgestrel (MIRENA, 52 MG,) 20 MCG/24HR IUD Mirena 20 mcg/24 hours (6 yrs) 52 mg intrauterine device  Take 1 device by intrauterine route.     rosuvastatin (CRESTOR) 10 MG tablet TAKE 1 TABLET(10 MG) BY MOUTH AT BEDTIME 30 tablet 2   clotrimazole-betamethasone (LOTRISONE) cream Apply 1 application topically 2 (two) times daily. (Patient not taking: Reported on  02/06/2021) 30 g 0   glimepiride (AMARYL) 1 MG tablet Take by mouth.     Insulin Glargine (BASAGLAR KWIKPEN) 100 UNIT/ML SOPN Inject 65 Units into the skin 2 (two) times daily.  (Patient not taking: Reported on 02/06/2021)     linaclotide (LINZESS) 290 MCG CAPS capsule Take 1 capsule (290 mcg total) by mouth daily before breakfast. (Patient not taking: Reported on 02/06/2021) 30 capsule 2   montelukast (SINGULAIR) 10 MG tablet TAKE 1 TABLET(10 MG) BY MOUTH AT BEDTIME (Patient not taking: Reported on 02/06/2021) 30 tablet 0   naphazoline-pheniramine (VISINE) 0.025-0.3 % ophthalmic  solution Place 1 drop into both eyes 4 (four) times daily as needed for eye irritation or allergies. (Patient not taking: Reported on 02/06/2021)     NIFEdipine (PROCARDIA XL/NIFEDICAL XL) 60 MG 24 hr tablet TAKE 1 TABLET(60 MG) BY MOUTH DAILY (Patient not taking: Reported on 02/06/2021) 90 tablet 1   spironolactone (ALDACTONE) 25 MG tablet TAKE 1 TABLET(25 MG) BY MOUTH DAILY (Patient not taking: Reported on 02/06/2021) 90 tablet 1   TRESIBA FLEXTOUCH 200 UNIT/ML FlexTouch Pen Inject into the skin.     TRULICITY 3 0000000 SOPN Inject into the skin.     doxycycline (VIBRA-TABS) 100 MG tablet Take 1 tablet (100 mg total) by mouth 2 (two) times daily. 20 tablet 0   doxycycline (VIBRA-TABS) 100 MG tablet Take 1 tablet (100 mg total) by mouth 2 (two) times daily. 20 tablet 0   promethazine-dextromethorphan (PROMETHAZINE-DM) 6.25-15 MG/5ML syrup Take 5 mLs by mouth 4 (four) times daily as needed. 118 mL 0   Semaglutide,0.25 or 0.5MG /DOS, (OZEMPIC, 0.25 OR 0.5 MG/DOSE,) 2 MG/1.5ML SOPN Ozempic 0.25 mg or 0.5 mg (2 mg/1.5 mL) subcutaneous pen injector (Patient not taking: Reported on 02/06/2021)     No facility-administered medications prior to visit.    No Known Allergies  Review of Systems  Constitutional:  Negative for chills, fever and malaise/fatigue.  HENT:  Negative for congestion and hearing loss.   Eyes:  Positive for  blurred vision. Negative for pain and discharge.  Respiratory:  Negative for cough, sputum production and shortness of breath.   Cardiovascular:  Negative for chest pain, palpitations and leg swelling.  Gastrointestinal:  Negative for abdominal pain, blood in stool, constipation, diarrhea, heartburn, nausea and vomiting.  Genitourinary:  Negative for dysuria, frequency, hematuria and urgency.  Musculoskeletal:  Negative for back pain, falls and myalgias.  Skin:  Negative for rash.  Neurological:  Positive for headaches. Negative for dizziness, sensory change, loss of consciousness and weakness.  Endo/Heme/Allergies:  Negative for environmental allergies. Does not bruise/bleed easily.  Psychiatric/Behavioral:  Negative for depression and suicidal ideas. The patient is not nervous/anxious and does not have insomnia.        Objective:    Physical Exam Vitals and nursing note reviewed.  Constitutional:      General: She is not in acute distress.    Appearance: Normal appearance. She is not ill-appearing.  HENT:     Head: Normocephalic and atraumatic.     Right Ear: External ear normal.     Left Ear: External ear normal.  Eyes:     Extraocular Movements: Extraocular movements intact.     Pupils: Pupils are equal, round, and reactive to light.  Cardiovascular:     Rate and Rhythm: Normal rate and regular rhythm.     Heart sounds: Normal heart sounds. No murmur heard.   No gallop.     Comments: Blood sugar measured 316 during visit Pulmonary:     Effort: Pulmonary effort is normal. No respiratory distress.     Breath sounds: Normal breath sounds. No wheezing or rales.  Abdominal:     General: There is no distension.     Palpations: Abdomen is soft.     Tenderness: There is no abdominal tenderness. There is no guarding.  Musculoskeletal:     Cervical back: Normal range of motion and neck supple.  Skin:    General: Skin is warm and dry.  Neurological:     Mental Status: She is  alert and oriented to person, place, and  time. Mental status is at baseline.  Psychiatric:        Mood and Affect: Mood normal.        Speech: Speech normal.        Behavior: Behavior normal.        Judgment: Judgment normal.    BP 134/70   Pulse 86   Temp 98.4 F (36.9 C) (Oral)   Ht 5' 8.5" (1.74 m)   Wt 240 lb 3.2 oz (109 kg)   SpO2 98%   BMI 35.99 kg/m  Wt Readings from Last 3 Encounters:  02/06/21 240 lb 3.2 oz (109 kg)  10/06/20 238 lb 12.8 oz (108.3 kg)  07/04/20 242 lb 3.2 oz (109.9 kg)    Diabetic Foot Exam - Simple   No data filed    Lab Results  Component Value Date   WBC 8.9 02/18/2020   HGB 15.2 (H) 02/18/2020   HCT 45.4 02/18/2020   PLT 264.0 02/18/2020   GLUCOSE 171 (H) 02/18/2020   CHOL 200 02/18/2020   TRIG 82.0 02/18/2020   HDL 44.30 02/18/2020   LDLDIRECT 139.3 11/20/2010   LDLCALC 139 (H) 02/18/2020   ALT 22 02/18/2020   AST 16 02/18/2020   NA 139 02/18/2020   K 4.2 02/18/2020   CL 102 02/18/2020   CREATININE 0.91 02/18/2020   BUN 15 02/18/2020   CO2 27 02/18/2020   TSH 1.89 02/18/2020   HGBA1C 11.1 (H) 02/10/2019   MICROALBUR 1.2 02/18/2020    Lab Results  Component Value Date   TSH 1.89 02/18/2020   Lab Results  Component Value Date   WBC 8.9 02/18/2020   HGB 15.2 (H) 02/18/2020   HCT 45.4 02/18/2020   MCV 88.6 02/18/2020   PLT 264.0 02/18/2020   Lab Results  Component Value Date   NA 139 02/18/2020   K 4.2 02/18/2020   CO2 27 02/18/2020   GLUCOSE 171 (H) 02/18/2020   BUN 15 02/18/2020   CREATININE 0.91 02/18/2020   BILITOT 0.4 02/18/2020   ALKPHOS 84 02/18/2020   AST 16 02/18/2020   ALT 22 02/18/2020   PROT 7.6 02/18/2020   ALBUMIN 4.4 02/18/2020   CALCIUM 9.9 02/18/2020   ANIONGAP 11 02/10/2019   GFR 75.31 02/18/2020   Lab Results  Component Value Date   CHOL 200 02/18/2020   Lab Results  Component Value Date   HDL 44.30 02/18/2020   Lab Results  Component Value Date   LDLCALC 139 (H) 02/18/2020    Lab Results  Component Value Date   TRIG 82.0 02/18/2020   Lab Results  Component Value Date   CHOLHDL 5 02/18/2020   Lab Results  Component Value Date   HGBA1C 11.1 (H) 02/10/2019       Assessment & Plan:   Problem List Items Addressed This Visit       Unprioritized   Essential hypertension    Well controlled, no changes to meds. Encouraged heart healthy diet such as the DASH diet and exercise as tolerated.       Uncontrolled type 2 diabetes mellitus with hyperglycemia (HCC)    Sugar almost 400 in office Increase amaryl 2 mg and f/u endo tomorrow I believe this could be causing her blurry vision ua -- tr ketones only       Relevant Medications   TRULICITY 3 MG/0.5ML SOPN   glimepiride (AMARYL) 1 MG tablet   TRESIBA FLEXTOUCH 200 UNIT/ML FlexTouch Pen   Other Relevant Orders   Comprehensive metabolic panel  Hemoglobin A1c   Sedimentation rate   POCT Urinalysis Dipstick (Automated) (Completed)   Other Visit Diagnoses     Blurry vision    -  Primary   Relevant Orders   POCT glucose (manual entry) (Completed)        No orders of the defined types were placed in this encounter.   I, Dr. Roma Schanz, DO, personally preformed the services described in this documentation.  All medical record entries made by the scribe were at my direction and in my presence.  I have reviewed the chart and discharge instructions (if applicable) and agree that the record reflects my personal performance and is accurate and complete. 02/06/2021   I,Shehryar Baig,acting as a scribe for Ann Held, DO.,have documented all relevant documentation on the behalf of Ann Held, DO,as directed by  Ann Held, DO while in the presence of Ann Held, DO.   Ann Held, DO

## 2021-02-06 NOTE — Telephone Encounter (Signed)
Pt has appt with PCP today.   [facility] Day - Client TELEPHONE ADVICE RECORD [facility] Patient Name:Kathleen Jordan Return Phone [phone](Primary) Initial Comment Caller states she has an appointment tomorrow.She has blurred vision and dizziness. She had her eyes checked and told they were fine. Translation No Nurse Assessment Nurse: Sande Brothers, RN, Ginny Date/Time (Eastern Time): 02/05/2021 4:32:08 PM Confirm and document reason for call. If symptomatic, describe symptoms. ---Caller stating that she has an appointment tomorrow. Stating symptoms beginning about a few months ago (blurred vision), stating dizziness beginning more recently and occurs more when she rises from a lying position. She has blurred vision and dizziness. She had her eyes checked and told they were fine. Does the patient have any new or worsening symptoms? ---Yes Will a triage be completed? ---Yes Related visit to physician within the last 2 weeks? ---No Does the PT have any chronic conditions? (i.e. diabetes, asthma, this includes High risk factors for pregnancy, etc.) ---Yes List chronic conditions. ---DM2, HTN, HLD Is the patient pregnant or possibly pregnant? (Ask all females between the ages of 108-55) ---No Is this a behavioral health or substance abuse call? ---No Guidelines Guideline Title Affirmed Question Affirmed Notes Nurse Date/Time (Eastern Time) Dizziness - Lightheadedness [1] MODERATE dizziness (e.g., interferes with normal activities) AND [2] has NOT Sande Brothers, RN, Ginny 02/05/2021 4:33:57 PM PLEASE NOTE: All timestamps contained within this report are represented as [place] Standard Time. CONFIDENTIALTY NOTICE: This fax transmission is intended only for the addressee. It contains information that is legally privileged, confidential or otherwise protected from use or disclosure. If you are not the intended recipient, you are strictly prohibited from reviewing,  disclosing, copying using or disseminating any of this information or taking any action in reliance on or regarding this information. If you have received this fax in error, please notify us immediately by telephone so that we can arrange for its return to [place]. Phone: [phone], Toll-Free: [phone], Fax: [phone] Page: 2 of 2 Call Id: [id number] Guidelines Guideline Title Affirmed Question Affirmed Notes Nurse Date/Time Lamount Cohen Time) been evaluated by physician for this (Exception: dizziness caused by heat exposure, sudden standing, or poor fluid intake) Disp. Time Lamount Cohen Time) Disposition Final User 02/05/2021 4:37:19 PM See PCP within 24 Hours Yes Sande Brothers, RN, Mariane Duval Caller Disagree/Comply Comply Caller Understands Yes PreDisposition Call Doctor Care Advice Given Per Guideline SEE PCP WITHIN 24 HOURS: * IF OFFICE WILL BE OPEN: You need to be examined within the next 24 hours. Call your doctor (or NP/PA) when the office opens and make an appointment. CALL BACK IF: * Passes out (faints) * You become worse CARE ADVICE given per Dizziness (Adult) guideline. Referrals REFERRED TO PCP OFFICE

## 2021-02-06 NOTE — Patient Instructions (Signed)

## 2021-02-07 ENCOUNTER — Other Ambulatory Visit: Payer: BC Managed Care – PPO

## 2021-02-07 ENCOUNTER — Other Ambulatory Visit (INDEPENDENT_AMBULATORY_CARE_PROVIDER_SITE_OTHER): Payer: BC Managed Care – PPO

## 2021-02-07 DIAGNOSIS — E1165 Type 2 diabetes mellitus with hyperglycemia: Secondary | ICD-10-CM

## 2021-02-07 DIAGNOSIS — R7309 Other abnormal glucose: Secondary | ICD-10-CM

## 2021-02-07 LAB — COMPREHENSIVE METABOLIC PANEL
ALT: 36 U/L — ABNORMAL HIGH (ref 0–35)
AST: 27 U/L (ref 0–37)
Albumin: 4.4 g/dL (ref 3.5–5.2)
Alkaline Phosphatase: 70 U/L (ref 39–117)
BUN: 15 mg/dL (ref 6–23)
CO2: 26 mEq/L (ref 19–32)
Calcium: 9.5 mg/dL (ref 8.4–10.5)
Chloride: 104 mEq/L (ref 96–112)
Creatinine, Ser: 0.8 mg/dL (ref 0.40–1.20)
GFR: 87.31 mL/min (ref >60.00)
Glucose, Bld: 169 mg/dL — ABNORMAL HIGH (ref 70–99)
Potassium: 3.7 mEq/L (ref 3.5–5.1)
Sodium: 139 mEq/L (ref 135–145)
Total Bilirubin: 0.4 mg/dL (ref 0.2–1.2)
Total Protein: 7.3 g/dL (ref 6.0–8.3)

## 2021-02-07 LAB — SEDIMENTATION RATE: Sed Rate: 25 mm/hr — ABNORMAL HIGH (ref 0–20)

## 2021-02-07 NOTE — Assessment & Plan Note (Signed)
Sugar almost 400 in office Increase amaryl 2 mg and f/u endo tomorrow I believe this could be causing her blurry vision ua -- tr ketones only

## 2021-02-07 NOTE — Assessment & Plan Note (Signed)
Well controlled, no changes to meds. Encouraged heart healthy diet such as the DASH diet and exercise as tolerated.  °

## 2021-02-08 LAB — HEMOGLOBIN A1C
Hgb A1c MFr Bld: 13 % of total Hgb — ABNORMAL HIGH (ref <5.7)
Mean Plasma Glucose: 326 mg/dL
eAG (mmol/L): 18.1 mmol/L

## 2021-03-19 ENCOUNTER — Other Ambulatory Visit: Payer: Self-pay | Admitting: Family Medicine

## 2021-03-19 ENCOUNTER — Encounter: Payer: Self-pay | Admitting: Family Medicine

## 2021-03-19 DIAGNOSIS — Z8742 Personal history of other diseases of the female genital tract: Secondary | ICD-10-CM

## 2021-03-19 MED ORDER — SPIRONOLACTONE 25 MG PO TABS: 25.0000 mg | ORAL_TABLET | Freq: Every day | ORAL | 1 refills | Status: DC

## 2021-03-19 NOTE — Telephone Encounter (Signed)
Please advise- it looks like spironolactone was d/c on 02/07/21 but I could not tell why.

## 2021-04-09 ENCOUNTER — Encounter: Payer: Self-pay | Admitting: Family Medicine

## 2021-04-10 ENCOUNTER — Encounter: Payer: Self-pay | Admitting: Family Medicine

## 2021-05-18 ENCOUNTER — Encounter: Payer: Self-pay | Admitting: Family Medicine

## 2021-05-18 DIAGNOSIS — I10 Essential (primary) hypertension: Secondary | ICD-10-CM

## 2021-05-18 MED ORDER — NIFEDIPINE ER OSMOTIC RELEASE 60 MG PO TB24: ORAL_TABLET | ORAL | 1 refills | Status: DC

## 2021-05-21 MED ORDER — NIFEDIPINE ER OSMOTIC RELEASE 60 MG PO TB24: ORAL_TABLET | ORAL | 1 refills | Status: DC

## 2021-05-21 NOTE — Addendum Note (Signed)
Addended by: Roxanne Gates on: 05/21/2021 10:55 AM   Modules accepted: Orders

## 2021-05-22 ENCOUNTER — Encounter: Payer: Self-pay | Admitting: Family Medicine

## 2021-05-22 DIAGNOSIS — K581 Irritable bowel syndrome with constipation: Secondary | ICD-10-CM

## 2021-05-28 ENCOUNTER — Telehealth (INDEPENDENT_AMBULATORY_CARE_PROVIDER_SITE_OTHER): Payer: BC Managed Care – PPO | Admitting: Family Medicine

## 2021-05-28 DIAGNOSIS — M2550 Pain in unspecified joint: Secondary | ICD-10-CM | POA: Diagnosis not present

## 2021-05-28 DIAGNOSIS — W19XXXA Unspecified fall, initial encounter: Secondary | ICD-10-CM

## 2021-05-28 NOTE — Progress Notes (Addendum)
MyChart Video Visit    Virtual Visit via Video Note   This visit type was conducted due to national recommendations for restrictions regarding the COVID-19 Pandemic (e.g. social distancing) in an effort to limit this patient's exposure and mitigate transmission in our community. This patient is at least at moderate risk for complications without adequate follow up. This format is felt to be most appropriate for this patient at this time. Physical exam was limited by quality of the video and audio technology used for the visit. [name] was able to get the patient set up on a video visit.  Patient location: Home Patient and provider in visit Provider location: Office  I discussed the limitations of evaluation and management by telemedicine and the availability of in person appointments. The patient expressed understanding and agreed to proceed.  Visit Date: 05/28/2021  Today's healthcare provider: Ann Held, DO     Subjective:    Patient ID: Kathleen Jordan, female    DOB: [birth year], 51 y.o.   MRN: [id number]  Chief Complaint  Patient presents with   Fall    HPI Patient is in today for a video visit.  She complains of abdominal pain going up to her breasts, hip and knee pain since 05/25/2021. She reports falling while on a section of elevated floor and fell onto hard floor. The next day her knees and elbow hurt after that she developed soreness around her breast and hips. Her elbow pain had improved but she continues having pain at all other sites. She has sore pain when squeezing her affected areas. She is taking OTC tylenol and finds mild relief.    Past Medical History:  Diagnosis Date   Allergic rhinitis    Anal fissure 07/15/2017   Chronic constipation    Frequency of urination    GERD (gastroesophageal reflux disease)    History of palpitations    Hx of varicella    Hyperlipidemia    Hypertension    Menorrhagia    OSA (obstructive  sleep apnea)    08-07-2017 no cpap due to finanaces   PCOS (polycystic ovarian syndrome)    Peripheral neuropathy    Thyroid goiter    Type 2 diabetes mellitus with hyperglycemia, with long-term current use of insulin [facility])    Urgency of urination     Past Surgical History:  Procedure Laterality Date   BREAST REDUCTION SURGERY Bilateral 07-18-2003   dr Towanda Malkin  [facility]   CHOLECYSTECTOMY N/A 02/15/2019   Procedure: LAPAROSCOPIC CHOLECYSTECTOMY;  Surgeon: Ralene Ok, MD;  Location: [place];  Service: General;  Laterality: N/A;   COLONOSCOPY WITH PROPOFOL N/A 07/15/2017   Procedure: COLONOSCOPY WITH PROPOFOL;  Surgeon: Irene Shipper, MD;  Location[facility];  Service: Endoscopy;  Laterality: N/A;   DILITATION & CURRETTAGE/HYSTROSCOPY WITH HYDROTHERMAL ABLATION N/A 03/06/2018   Procedure: DILATATION & CURETTAGE/HYSTEROSCOPY WITH HYDROTHERMAL ABLATION, INSERTION OF INTRAUTERINE DEVICE;  Surgeon: Eldred Manges, MD;  Location: [place];  Service: Gynecology;  Laterality: N/A;   invitro fertilization  11/16/2010   [facility]    Family History  Problem Relation Age of Onset   Arthritis Mother    Diabetes Mother    Hypertension Mother    Heart disease Mother 43       MI   Stroke Mother    Congestive Heart Failure Mother    Allergic rhinitis Mother    Asthma Mother    Allergic rhinitis Sister    Diabetes  Brother    Hypertension Brother    Arthritis Maternal Grandmother    Diabetes Maternal Grandmother    Hypertension Maternal Grandmother    Cancer Maternal Grandmother    Stroke Maternal Grandfather    Cancer Maternal Grandfather    Asthma Sister    Allergies Sister     Social History   Socioeconomic History   Marital status: Married    Spouse name: Caryn Bee   Number of children: 1   Years of education: Masters   Highest education level: Not on file  Occupational History   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[place]     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 [place]  Tobacco Use   Smoking status: Never    Passive exposure: Yes   Smokeless tobacco: Never   Tobacco comments:    spouse smokes outside the home  Vaping Use   Vaping Use: Never used  Substance and Sexual Activity   Alcohol use: Yes    Comment: social   Drug use: No   Sexual activity: Yes    Partners: Male    Birth control/protection: None  Other Topics Concern   Not on file  Social History Narrative   Drinks caffeine 2x a week    Social Determinants of Corporate investment banker Strain: Not on file  Food Insecurity: Not on file  Transportation Needs: Not on file  Physical Activity: Not on file  Stress: Not on file  Social Connections: Not on file  Intimate Partner Violence: Not on file    Outpatient Medications Prior to Visit  Medication Sig Dispense Refill   cetirizine (ZYRTEC ALLERGY) 10 MG tablet Take 1 tablet (10 mg total) by mouth daily. 30 tablet 11   Ciclopirox 1 % shampoo Massage into scalp , leave 5 min and rinse 120 mL 0   diphenhydrAMINE (BENADRYL) 25 MG tablet Take 25 mg by mouth every 6 (six) hours as needed for allergies.     empagliflozin (JARDIANCE) 25 MG TABS tablet Take 25 mg by mouth every evening.      EPINEPHrine (AUVI-Q) 0.3 mg/0.3 mL IJ SOAJ injection Inject 0.3 mLs (0.3 mg total) into the muscle as needed for anaphylaxis. 1 each 1   ezetimibe (ZETIA) 10 MG tablet TAKE 1 TABLET(10 MG) BY MOUTH DAILY 90 tablet 1   glimepiride (AMARYL) 1 MG tablet Take by mouth.     glucose blood (CONTOUR NEXT TEST) test strip Check BG 3 times/day.  Dx E11.65     NIFEdipine (PROCARDIA XL/NIFEDICAL XL) 60 MG 24 hr tablet TAKE 1 TABLET(60 MG) BY MOUTH DAILY 90 tablet 1   rosuvastatin (CRESTOR) 10 MG tablet TAKE 1 TABLET(10 MG) BY MOUTH AT BEDTIME 30 tablet 2   spironolactone (ALDACTONE) 25 MG tablet Take 1 tablet (25 mg total) by mouth daily. 90 tablet 1   TRESIBA FLEXTOUCH 200 UNIT/ML FlexTouch Pen Inject into the skin.      TRULICITY 3 MG/0.5ML SOPN Inject into the skin.     montelukast (SINGULAIR) 10 MG tablet TAKE 1 TABLET(10 MG) BY MOUTH AT BEDTIME (Patient not taking: Reported on 02/06/2021) 30 tablet 0   No facility-administered medications prior to visit.    No Known Allergies  Review of Systems  Musculoskeletal:  Positive for joint pain (bilateral knees, hips).       (+)pain going up from abdomen to around breast  Endo/Heme/Allergies:        (-)bruising around abdomen, breast, both knee, both hips  Objective:    Physical Exam Vitals and nursing note reviewed.  Constitutional:      Appearance: She is well-developed.  HENT:     Head: Normocephalic and atraumatic.  Neck:     Thyroid: No thyromegaly.     Vascular: No JVD.  Pulmonary:     Effort: Pulmonary effort is normal.  Musculoskeletal:     Cervical back: Normal range of motion and neck supple.  Neurological:     Mental Status: She is alert and oriented to person, place, and time.  Psychiatric:        Mood and Affect: Mood normal.        Behavior: Behavior normal.   There were no vitals taken for this visit. Wt Readings from Last 3 Encounters:  02/06/21 240 lb 3.2 oz (109 kg)  10/06/20 238 lb 12.8 oz (108.3 kg)  07/04/20 242 lb 3.2 oz (109.9 kg)    Diabetic Foot Exam - Simple   No data filed    Lab Results  Component Value Date   WBC 8.9 02/18/2020   HGB 15.2 (H) 02/18/2020   HCT 45.4 02/18/2020   PLT 264.0 02/18/2020   GLUCOSE 169 (H) 02/07/2021   CHOL 200 02/18/2020   TRIG 82.0 02/18/2020   HDL 44.30 02/18/2020   LDLDIRECT 139.3 11/20/2010   LDLCALC 139 (H) 02/18/2020   ALT 36 (H) 02/07/2021   AST 27 02/07/2021   NA 139 02/07/2021   K 3.7 02/07/2021   CL 104 02/07/2021   CREATININE 0.80 02/07/2021   BUN 15 02/07/2021   CO2 26 02/07/2021   TSH 1.89 02/18/2020   HGBA1C 13.0 (H) 02/07/2021   MICROALBUR 1.2 02/18/2020    Lab Results  Component Value Date   TSH 1.89 02/18/2020   Lab Results  Component  Value Date   WBC 8.9 02/18/2020   HGB 15.2 (H) 02/18/2020   HCT 45.4 02/18/2020   MCV 88.6 02/18/2020   PLT 264.0 02/18/2020   Lab Results  Component Value Date   NA 139 02/07/2021   K 3.7 02/07/2021   CO2 26 02/07/2021   GLUCOSE 169 (H) 02/07/2021   BUN 15 02/07/2021   CREATININE 0.80 02/07/2021   BILITOT 0.4 02/07/2021   ALKPHOS 70 02/07/2021   AST 27 02/07/2021   ALT 36 (H) 02/07/2021   PROT 7.3 02/07/2021   ALBUMIN 4.4 02/07/2021   CALCIUM 9.5 02/07/2021   ANIONGAP 11 02/10/2019   GFR 87.31 02/07/2021   Lab Results  Component Value Date   CHOL 200 02/18/2020   Lab Results  Component Value Date   HDL 44.30 02/18/2020   Lab Results  Component Value Date   LDLCALC 139 (H) 02/18/2020   Lab Results  Component Value Date   TRIG 82.0 02/18/2020   Lab Results  Component Value Date   CHOLHDL 5 02/18/2020   Lab Results  Component Value Date   HGBA1C 13.0 (H) 02/07/2021       Assessment & Plan:   Problem List Items Addressed This Visit       Unprioritized   Fall - Primary    Ice/ heat  Recommend she come in in person to be evaluated  Tylenol prn         No orders of the defined types were placed in this encounter.   I discussed the assessment and treatment plan with the patient. The patient was provided an opportunity to ask questions and all were answered. The patient agreed with the plan and  demonstrated an understanding of the instructions.   The patient was advised to call back or seek an in-person evaluation if the symptoms worsen or if the condition fails to improve as anticipated.  I,Shehryar Baig,acting as a Education administrator for [employer], DO.,have documented all relevant documentation on the behalf of Ann Held, DO,as directed by  Ann Held, DO while in the presence of Ann Held, DO.  I provided 20 minutes of face-to-face time during this encounter.   Ann Held, DO [place] [facility] [employer] [phone] (phone) [phone] (fax)  [place]

## 2021-06-03 ENCOUNTER — Encounter: Payer: Self-pay | Admitting: Family Medicine

## 2021-06-03 DIAGNOSIS — W19XXXA Unspecified fall, initial encounter: Secondary | ICD-10-CM | POA: Insufficient documentation

## 2021-06-03 NOTE — Assessment & Plan Note (Signed)
Ice/ heat  ?Recommend she come in in person to be evaluated  ?Tylenol prn  ?

## 2021-06-07 DIAGNOSIS — M2550 Pain in unspecified joint: Secondary | ICD-10-CM | POA: Insufficient documentation

## 2021-06-07 NOTE — Assessment & Plan Note (Signed)
con't tylenol ?Ice/ heat  ?F/u if does not con't to improve  ?

## 2021-06-12 LAB — COMPREHENSIVE METABOLIC PANEL
Albumin: 4.4 (ref 3.5–5.0)
Calcium: 9.9 (ref 8.7–10.7)
eGFR: 78

## 2021-06-12 LAB — BASIC METABOLIC PANEL
BUN: 17 (ref 4–21)
CO2: 25 — AB (ref 13–22)
Chloride: 105 (ref 99–108)
Creatinine: 0.9 (ref <=1.1)
Glucose: 182
Potassium: 4 mEq/L (ref 3.5–5.1)
Sodium: 139 (ref 137–147)

## 2021-06-12 LAB — HEPATIC FUNCTION PANEL
ALT: 57 U/L — AB (ref 7–35)
AST: 38 — AB (ref 13–35)
Alkaline Phosphatase: 83 (ref 25–125)
Bilirubin, Total: 0.4

## 2021-06-12 LAB — CBC AND DIFFERENTIAL
HCT: 43 (ref 36–46)
Hemoglobin: 14.9 (ref 12.0–16.0)
Neutrophils Absolute: 4.7
Platelets: 279 10*3/uL (ref 150–400)
WBC: 8.6

## 2021-06-12 LAB — CBC: RBC: 4.95 (ref 3.87–5.11)

## 2021-06-15 ENCOUNTER — Ambulatory Visit: Payer: BC Managed Care – PPO | Admitting: Family Medicine

## 2021-06-15 ENCOUNTER — Encounter: Payer: Self-pay | Admitting: Family Medicine

## 2021-06-15 ENCOUNTER — Other Ambulatory Visit: Payer: Self-pay | Admitting: Family Medicine

## 2021-06-15 ENCOUNTER — Ambulatory Visit (HOSPITAL_BASED_OUTPATIENT_CLINIC_OR_DEPARTMENT_OTHER)
Admission: RE | Admit: 2021-06-15 | Discharge: 2021-06-15 | Disposition: A | Discharge status: Home / Self Care | Payer: BC Managed Care – PPO | Source: Ambulatory Visit | Attending: Family Medicine | Admitting: Family Medicine

## 2021-06-15 ENCOUNTER — Other Ambulatory Visit: Payer: Self-pay

## 2021-06-15 VITALS — BP 100/80 | HR 77 | Temp 98.1°F | Resp 18 | Ht 68.5 in | Wt 238.8 lb

## 2021-06-15 DIAGNOSIS — M25551 Pain in right hip: Secondary | ICD-10-CM

## 2021-06-15 DIAGNOSIS — M5441 Lumbago with sciatica, right side: Secondary | ICD-10-CM | POA: Diagnosis present

## 2021-06-15 MED ORDER — CYCLOBENZAPRINE HCL 10 MG PO TABS: 10.0000 mg | ORAL_TABLET | Freq: Three times a day (TID) | ORAL | 1 refills | Status: DC | PRN

## 2021-06-15 MED ORDER — MELOXICAM 15 MG PO TABS: ORAL_TABLET | ORAL | 0 refills | Status: DC

## 2021-06-15 NOTE — Patient Instructions (Addendum)
Pt needs to fill out med rec release for [facility] ? ? ? ? ?Acute Back Pain, Adult ?Acute back pain is sudden and usually short-lived. It is often caused by an injury to the muscles and tissues in the back. The injury may result from: ?A muscle, tendon, or ligament getting overstretched or torn. Ligaments are tissues that connect bones to each other. Lifting something improperly can cause a back strain. ?Wear and tear (degeneration) of the spinal disks. Spinal disks are circular tissue that provide cushioning between the bones of the spine (vertebrae). ?Twisting motions, such as while playing sports or doing yard work. ?A hit to the back. ?Arthritis. ?You may have a physical exam, lab tests, and imaging tests to find the cause of your pain. Acute back pain usually goes away with rest and home care. ?Follow these instructions at home: ?Managing pain, stiffness, and swelling ?Take over-the-counter and prescription medicines only as told by your health care provider. Treatment may include medicines for pain and inflammation that are taken by mouth or applied to the skin, or muscle relaxants. ?Your health care provider may recommend applying ice during the first 24-48 hours after your pain starts. To do this: ?Put ice in a plastic bag. ?Place a towel between your skin and the bag. ?Leave the ice on for 20 minutes, 2-3 times a day. ?Remove the ice if your skin turns bright red. This is very important. If you cannot feel pain, heat, or cold, you have a greater risk of damage to the area. ?If directed, apply heat to the affected area as often as told by your health care provider. Use the heat source that your health care provider recommends, such as a moist heat pack or a heating pad. ?Place a towel between your skin and the heat source. ?Leave the heat on for 20-30 minutes. ?Remove the heat if your skin turns bright red. This is especially important if you are unable to feel pain, heat, or cold. You have a greater risk of  getting burned. ?Activity ? ?Do not stay in bed. Staying in bed for more than 1-2 days can delay your recovery. ?Sit up and stand up straight. Avoid leaning forward when you sit or hunching over when you stand. ?If you work at a desk, sit close to it so you do not need to lean over. Keep your chin tucked in. Keep your neck drawn back, and keep your elbows bent at a 90-degree angle (right angle). ?Sit high and close to the steering wheel when you drive. Add lower back (lumbar) support to your car seat, if needed. ?Take short walks on even surfaces as soon as you are able. Try to increase the length of time you walk each day. ?Do not sit, drive, or stand in one place for more than 30 minutes at a time. Sitting or standing for long periods of time can put stress on your back. ?Do not drive or use heavy machinery while taking prescription pain medicine. ?Use proper lifting techniques. When you bend and lift, use positions that put less stress on your back: ?[place] your knees. ?Keep the load close to your body. ?Avoid twisting. ?Exercise regularly as told by your health care provider. Exercising helps your back heal faster and helps prevent back injuries by keeping muscles strong and flexible. ?Work with a physical therapist to make a safe exercise program, as recommended by your health care provider. Do any exercises as told by your physical therapist. ?Lifestyle ?Maintain a healthy  weight. Extra weight puts stress on your back and makes it difficult to have good posture. ?Avoid activities or situations that make you feel anxious or stressed. Stress and anxiety increase muscle tension and can make back pain worse. Learn ways to manage anxiety and stress, such as through exercise. ?General instructions ?Sleep on a firm mattress in a comfortable position. Try lying on your side with your knees slightly bent. If you lie on your back, put a pillow under your knees. ?Keep your head and neck in a straight line with your spine  (neutral position) when using electronic equipment like smartphones or pads. To do this: ?Raise your smartphone or pad to look at it instead of bending your head or neck to look down. ?Put the smartphone or pad at the level of your face while looking at the screen. ?Follow your treatment plan as told by your health care provider. This may include: ?Cognitive or behavioral therapy. ?Acupuncture or massage therapy. ?Meditation or yoga. ?Contact a health care provider if: ?You have pain that is not relieved with rest or medicine. ?You have increasing pain going down into your legs or buttocks. ?Your pain does not improve after 2 weeks. ?You have pain at night. ?You lose weight without trying. ?You have a fever or chills. ?You develop nausea or vomiting. ?You develop abdominal pain. ?Get help right away if: ?You develop new bowel or bladder control problems. ?You have unusual weakness or numbness in your arms or legs. ?You feel faint. ?These symptoms may represent a serious problem that is an emergency. Do not wait to see if the symptoms will go away. Get medical help right away. Call your local emergency services (911 in the [place] Do not drive yourself to the hospital. ?Summary ?Acute back pain is sudden and usually short-lived. ?Use proper lifting techniques. When you bend and lift, use positions that put less stress on your back. ?Take over-the-counter and prescription medicines only as told by your health care provider, and apply heat or ice as told. ?This information is not intended to replace advice given to you by your health care provider. Make sure you discuss any questions you have with your health care provider. ?Document Revised: 06/09/2020 Document Reviewed: 06/09/2020 ?Elsevier Patient Education ? 2022 Elsevier Inc. ? ?

## 2021-06-15 NOTE — Progress Notes (Signed)
? ?Subjective:  ? ?By signing my name below, I, Kathleen Jordan, attest that this documentation has been prepared under the direction and in the presence of Kathleen Schultz, DO. 06/15/2021  ? ? Patient ID: Kathleen Jordan, female    DOB: 05/16/1970, 51 y.o.   MRN: [id number] ? ?Chief Complaint  ?Patient presents with  ? Fall  ?  Pt states still having pain from fall in February. Pt reports no more falls. Pt states right hip pain has gotten worse.   ? Follow-up  ? ? ?HPI ?Patient is in today for an office visit. ? ?She had a fall in February where she fell forward and landed on her chin. She reports the pain is on her right side and radiates down to her calf. The pain is exacerbated when she is sitting down and she feels better when lying down. She feels her legs give up especially after she walks for a long period of time and often gets short of breath when she walks from her car to her office.  She still has spasms in her stomach. She is taking tylenol and it does not provide relief. She was given medication by her GI that has helped her to be more active but is not helping the pain. ? ?Past Medical History:  ?Diagnosis Date  ? Allergic rhinitis   ? Anal fissure 07/15/2017  ? Chronic constipation   ? Frequency of urination   ? GERD (gastroesophageal reflux disease)   ? History of palpitations   ? Hx of varicella   ? Hyperlipidemia   ? Hypertension   ? Menorrhagia   ? OSA (obstructive sleep apnea)   ? 08-07-2017 no cpap due to finanaces  ? PCOS (polycystic ovarian syndrome)   ? Peripheral neuropathy   ? Thyroid goiter   ? Type 2 diabetes mellitus with hyperglycemia, with long-term current use of insulin (HCC)   ? Urgency of urination   ? ? ?Past Surgical History:  ?Procedure Laterality Date  ? BREAST REDUCTION SURGERY Bilateral 07-18-2003   dr Shon Hough  [facility]  ? CHOLECYSTECTOMY N/A 02/15/2019  ? Procedure: LAPAROSCOPIC CHOLECYSTECTOMY;  Surgeon: Axel Filler, MD;  Location: [facility];  Service: General;   Laterality: N/A;  ? COLONOSCOPY WITH PROPOFOL N/A 07/15/2017  ? Procedure: COLONOSCOPY WITH PROPOFOL;  Surgeon: Hilarie Fredrickson, MD;  [facility];  Service: Endoscopy;  Laterality: N/A;  ? DILITATION & CURRETTAGE/HYSTROSCOPY WITH HYDROTHERMAL ABLATION N/A 03/06/2018  ? Procedure: DILATATION & CURETTAGE/HYSTEROSCOPY WITH HYDROTHERMAL ABLATION, INSERTION OF INTRAUTERINE DEVICE;  Surgeon: Hal Morales, MD;  [facility];  Service: Gynecology;  Laterality: N/A;  ? invitro fertilization  11/16/2010  ? [facility]  ? ? ?Family History  ?Problem Relation Age of Onset  ? Arthritis Mother   ? Diabetes Mother   ? Hypertension Mother   ? Heart disease Mother 77  ?     MI  ? Stroke Mother   ? Congestive Heart Failure Mother   ? Allergic rhinitis Mother   ? Asthma Mother   ? Allergic rhinitis Sister   ? Diabetes Brother   ? Hypertension Brother   ? Arthritis Maternal Grandmother   ? Diabetes Maternal Grandmother   ? Hypertension Maternal Grandmother   ? Cancer Maternal Grandmother   ? Stroke Maternal Grandfather   ? Cancer Maternal Grandfather   ? Asthma Sister   ? Allergies Sister   ? ? ?Social History  ? ?Socioeconomic History  ? Marital status:  Married  ?  Spouse name: Caryn Bee  ? Number of children: 1  ? Years of education: Masters  ? Highest education level: Not on file  ?Occupational History  ? Occupation: Airline pilot  ?  Employer: [employer] [place]  ?  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 Smoking status: Never  ?  Passive exposure: Yes  ? Smokeless tobacco: Never  ? Tobacco comments:  ?  spouse smokes outside the home  ?Vaping Use  ? Vaping Use: Never used  ?Substance and Sexual Activity  ? Alcohol use: Yes  ?  Comment: social  ? Drug use: No  ? Sexual activity: Yes  ?  Partners: Male  ?  Birth control/protection: None  ?Other Topics Concern  ? Not on file  ?Social History Narrative  ? Drinks caffeine 2x a week   ? ?Social Determinants of Health   ? ?Financial Resource Strain: Not on file  ?Food Insecurity: Not on file  ?Transportation Needs: Not on file  ?Physical Activity: Not on file  ?Stress: Not on file  ?Social Connections: Not on file  ?Intimate Partner Violence: Not on file  ? ? ?Outpatient Medications Prior to Visit  ?Medication Sig Dispense Refill  ? cetirizine (ZYRTEC ALLERGY) 10 MG tablet Take 1 tablet (10 mg total) by mouth daily. 30 tablet 11  ? Ciclopirox 1 % shampoo Massage into scalp , leave 5 min and rinse 120 mL 0  ? diphenhydrAMINE (BENADRYL) 25 MG tablet Take 25 mg by mouth every 6 (six) hours as needed for allergies.    ? empagliflozin (JARDIANCE) 25 MG TABS tablet Take 25 mg by mouth every evening.     ? EPINEPHrine (AUVI-Q) 0.3 mg/0.3 mL IJ SOAJ injection Inject 0.3 mLs (0.3 mg total) into the muscle as needed for anaphylaxis. 1 each 1  ? ezetimibe (ZETIA) 10 MG tablet TAKE 1 TABLET(10 MG) BY MOUTH DAILY 90 tablet 1  ? glimepiride (AMARYL) 1 MG tablet Take by mouth.    ? glucose blood (CONTOUR NEXT TEST) test strip Check BG 3 times/day.  Dx E11.65    ? NIFEdipine (PROCARDIA XL/NIFEDICAL XL) 60 MG 24 hr tablet TAKE 1 TABLET(60 MG) BY MOUTH DAILY 90 tablet 1  ? rosuvastatin (CRESTOR) 10 MG tablet TAKE 1 TABLET(10 MG) BY MOUTH AT BEDTIME 30 tablet 2  ? spironolactone (ALDACTONE) 25 MG tablet Take 1 tablet (25 mg total) by mouth daily. 90 tablet 1  ? TRESIBA FLEXTOUCH 200 UNIT/ML FlexTouch Pen Inject into the skin.    ? TRULICITY 3 MG/0.5ML SOPN Inject into the skin.    ? montelukast (SINGULAIR) 10 MG tablet TAKE 1 TABLET(10 MG) BY MOUTH AT BEDTIME (Patient not taking: Reported on 02/06/2021) 30 tablet 0  ? ?No facility-administered medications prior to visit.  ? ? ?No Known Allergies ? ?Review of Systems  ?Constitutional:  Negative for fever.  ?HENT:  Negative for congestion, ear pain, hearing loss, sinus pain and sore throat.   ?Eyes:  Negative for blurred vision and pain.  ?Respiratory:  Negative for cough, sputum production, shortness  of breath and wheezing.   ?Cardiovascular:  Negative for chest pain and palpitations.  ?Gastrointestinal:  Negative for blood in stool, constipation, diarrhea, nausea and vomiting.  ?Genitourinary:  Negative for dysuria, frequency, hematuria and urgency.  ?Musculoskeletal:  Positive for back pain (right side). Negative for falls and myalgias.  ?Neurological:  Negative for dizziness, sensory change, loss of consciousness, weakness and headaches.  ?Endo/Heme/Allergies:  Negative  for environmental allergies. Does not bruise/bleed easily.  ?Psychiatric/Behavioral:  Negative for depression and suicidal ideas. The patient is not nervous/anxious and does not have insomnia.   ? ?   ?Objective:  ?  ?Physical Exam ?Constitutional:   ?   General: She is not in acute distress. ?   Appearance: Normal appearance. She is not ill-appearing.  ?HENT:  ?   Head: Normocephalic and atraumatic.  ?   Right Ear: External ear normal.  ?   Left Ear: External ear normal.  ?Eyes:  ?   Extraocular Movements: Extraocular movements intact.  ?   Pupils: Pupils are equal, round, and reactive to light.  ?Cardiovascular:  ?   Rate and Rhythm: Normal rate and regular rhythm.  ?   Pulses: Normal pulses.  ?   Heart sounds: Normal heart sounds. No murmur heard. ?  No gallop.  ?Pulmonary:  ?   Effort: Pulmonary effort is normal. No respiratory distress.  ?   Breath sounds: Normal breath sounds. No wheezing, rhonchi or rales.  ?Abdominal:  ?   General: Bowel sounds are normal. There is no distension.  ?   Palpations: Abdomen is soft. There is no mass.  ?   Tenderness: There is no abdominal tenderness. There is no guarding or rebound.  ?   Hernia: No hernia is present.  ?Musculoskeletal:  ?   Cervical back: Normal range of motion and neck supple.  ?   Comments: Full range of motion with good strength ?Pain with straight leg test on right leg  ?Lymphadenopathy:  ?   Cervical: No cervical adenopathy.  ?Skin: ?   General: Skin is warm and dry.  ?Neurological:   ?   Mental Status: She is alert and oriented to person, place, and time.  ?Psychiatric:     ?   Behavior: Behavior normal.  ? ? ?BP 100/80 (BP Location: Right Arm, Patient Position: Sitting, Cuff Size: Large)

## 2021-06-18 ENCOUNTER — Encounter: Payer: Self-pay | Admitting: Family Medicine

## 2021-06-18 DIAGNOSIS — M5441 Lumbago with sciatica, right side: Secondary | ICD-10-CM

## 2021-06-19 NOTE — Telephone Encounter (Signed)
Referral placed.

## 2021-06-26 ENCOUNTER — Encounter: Payer: Self-pay | Admitting: Family Medicine

## 2021-07-02 NOTE — Progress Notes (Signed)
? ? Kathleen Jordan D.Judd Gaudier ?[facility] ?[address] [place] [address] ?Phone: [phone] ?  ?Assessment and Plan:   ?  ?1. Acute right-sided low back pain without sciatica ?-Subacute, worsening, initial sports medicine visit ?- Likely strain of right obliques +/- quadratus lumborum that is failed improved due to limited physical activity targeting these areas and their constant strain with keeping upright posture based on HPI, physical exam, imaging ?- Patient was taking meloxicam 15 mg twice daily.  Educated that maximum strength is meloxicam 15 mg daily and that she should not use about this dose ?- We will trial tizanidine 4 mg every 8 hours as needed for muscle spasms.  Patient to call back if medication makes her too drowsy, and we could instead prescribe Robaxin ?- Start HEP targeting core ?- Patient elected for methylprednisone/ketorolac 80 mg / 60 mg IM injection at today's visit ?- Patient elected for initial OMT today.  Tolerated well per note below. ?- Decision today to treat with OMT was based on Physical Exam ? ?After verbal consent patient was treated with HVLA (high velocity low amplitude), ME (muscle energy), FPR (flex positional release), ST (soft tissue), PC/PD (Pelvic Compression/ Pelvic Decompression) techniques in  thoracic, lumbar, and pelvic areas. Patient tolerated the procedure well although with no improvement in symptoms.  Patient educated on potential side effects of soreness and recommended to rest, hydrate, and use Tylenol as needed for pain control.  ?  ?Pertinent previous records reviewed include PCP note 06/15/2021, x-ray lumbar spine 06/15/2021, x-ray right hip 06/15/2021 ?  ?Follow Up: 2 weeks for reevaluation.  Could consider PT versus repeat OMT if beneficial versus advanced imaging versus trigger point injections ?  ?Subjective:   ?I, Kathleen Jordan, am serving as a Neurosurgeon for [employer] ? ?Chief Complaint: right side low back pain   ? ?HPI:  ? ?07/03/2021 ?Patient is a 51 year old female complaining of right sided low back pain. Patient states that is a non stop pain, that [place], no numbness or tingling, pain radiates all the way across and all the way to the front, has been taking meloxicam and  flexeril at night and those don't seem to be helping, finds relief when she stands, sitting is very uncomfortable , has been sign heat and that doesn't seem to be helping at all  ? ?Relevant Historical Information: DM type II, hypertension, IBS ? ?Additional pertinent review of systems negative. ? ? ?Current Outpatient Medications:  ?  cetirizine (ZYRTEC ALLERGY) 10 MG tablet, Take 1 tablet (10 mg total) by mouth daily., Disp: 30 tablet, Rfl: 11 ?  Ciclopirox 1 % shampoo, Massage into scalp , leave 5 min and rinse, Disp: 120 mL, Rfl: 0 ?  cyclobenzaprine (FLEXERIL) 10 MG tablet, Take 1 tablet (10 mg total) by mouth 3 (three) times daily as needed for muscle spasms., Disp: 30 tablet, Rfl: 1 ?  dicyclomine (BENTYL) 20 MG tablet, Take 20 mg by mouth every 6 (six) hours., Disp: , Rfl:  ?  empagliflozin (JARDIANCE) 25 MG TABS tablet, Take 25 mg by mouth every evening. , Disp: , Rfl:  ?  EPINEPHrine (AUVI-Q) 0.3 mg/0.3 mL IJ SOAJ injection, Inject 0.3 mLs (0.3 mg total) into the muscle as needed for anaphylaxis., Disp: 1 each, Rfl: 1 ?  ezetimibe (ZETIA) 10 MG tablet, TAKE 1 TABLET(10 MG) BY MOUTH DAILY, Disp: 90 tablet, Rfl: 1 ?  glimepiride (AMARYL) 1 MG  tablet, Take by mouth., Disp: , Rfl:  ?  glucose blood (CONTOUR NEXT TEST) test strip, Check BG 3 times/day.  Dx E11.65, Disp: , Rfl:  ?  meloxicam (MOBIC) 15 MG tablet, 1/2-1 po qd prn, Disp: 30 tablet, Rfl: 0 ?  NIFEdipine (PROCARDIA XL/NIFEDICAL XL) 60 MG 24 hr tablet, TAKE 1 TABLET(60 MG) BY MOUTH DAILY, Disp: 90 tablet, Rfl: 1 ?  rosuvastatin (CRESTOR) 10 MG tablet, TAKE 1 TABLET(10 MG) BY MOUTH AT BEDTIME, Disp: 30 tablet, Rfl: 2 ?  spironolactone (ALDACTONE) 25  MG tablet, Take 1 tablet (25 mg total) by mouth daily., Disp: 90 tablet, Rfl: 1 ?  tiZANidine (ZANAFLEX) 4 MG tablet, Take 1 tablet (4 mg total) by mouth every 8 (eight) hours as needed for muscle spasms., Disp: 40 tablet, Rfl: 0 ?  TRESIBA FLEXTOUCH 200 UNIT/ML FlexTouch Pen, Inject into the skin., Disp: , Rfl:  ?  TRULICITY 3 MG/0.5ML SOPN, Inject into the skin., Disp: , Rfl:  ?  diphenhydrAMINE (BENADRYL) 25 MG tablet, Take 25 mg by mouth every 6 (six) hours as needed for allergies., Disp: , Rfl:   ? ?Objective:   ?  ?Vitals:  ? 07/03/21 0805  ?Pulse: 88  ?SpO2: 97%  ?Weight: 241 lb (109.3 kg)  ?Height: 5\' 8"  (1.727 m)  ?  ?  ?Body mass index is 36.64 kg/m?.  ?  ?Physical Exam:   ? ?Gen: Appears well, nad, nontoxic and pleasant ?Psych: Alert and oriented, appropriate mood and affect ?Neuro: sensation intact, strength is 5/5 in upper and lower extremities, muscle tone wnl ?Skin: no susupicious lesions or rashes ? ?Back - Normal skin, Spine with normal alignment and no deformity.   ?No tenderness to vertebral process palpation.   ?Paraspinous muscles are not tender and without spasm ?Straight leg raise negative, though did reproduce nonradiating pain over right flank with bilateral leg raises ?TTP along the right posterior flank ? ?General: Well-appearing, cooperative, sitting comfortably in no acute distress.  ? ?OMT Physical Exam: ? ?ASIS Compression Test: Positive Right ?  ?Thoracic: TTP paraspinal, T11-L2 RLSR ?Lumbar: TTP paraspinal, T11-L2 RLSR ?Pelvis: Right anterior innominate  ? ? ?Electronically signed by:  ? D.Kathleen Jordan ?[facility] ?8:42 AM 07/03/21 ?

## 2021-07-03 ENCOUNTER — Ambulatory Visit: Payer: BC Managed Care – PPO | Admitting: Sports Medicine

## 2021-07-03 VITALS — HR 88 | Ht 68.0 in | Wt 241.0 lb

## 2021-07-03 DIAGNOSIS — M545 Low back pain, unspecified: Secondary | ICD-10-CM

## 2021-07-03 MED ORDER — METHYLPREDNISOLONE ACETATE 80 MG/ML IJ SUSP
80.0000 mg | Freq: Once | INTRAMUSCULAR | Status: AC
  Administered 2021-07-03: 80 mg via INTRAMUSCULAR

## 2021-07-03 MED ORDER — TIZANIDINE HCL 4 MG PO TABS
4.0000 mg | ORAL_TABLET | Freq: Three times a day (TID) | ORAL | 0 refills | Status: DC | PRN
Start: 2021-07-03 — End: 2023-05-30

## 2021-07-03 MED ORDER — KETOROLAC TROMETHAMINE 60 MG/2ML IM SOLN
60.0000 mg | Freq: Once | INTRAMUSCULAR | Status: AC
  Administered 2021-07-03: 60 mg via INTRAMUSCULAR

## 2021-07-03 NOTE — Addendum Note (Signed)
Addended by: Evon Slack on: 07/03/2021 08:53 AM ? ? Modules accepted: Orders ? ?

## 2021-07-03 NOTE — Patient Instructions (Addendum)
Good to see you  ?Start meloxicam 15 mg daily x2 weeks.  If still having pain after 2 weeks, complete 3rd-week of meloxicam. May use remaining meloxicam as needed once daily for pain control.  Do not to use additional NSAIDs while taking meloxicam.  May use Tylenol [phone] mg 2 to 3 times a day for breakthrough pain. ?Only take 1 15 mg a day  ?Core HEP ?Tizanidine 4 mg every 8 hours as needed for muscle spasms    ?2 week follow up ?

## 2021-07-10 ENCOUNTER — Other Ambulatory Visit: Payer: Self-pay | Admitting: Family Medicine

## 2021-07-10 DIAGNOSIS — M25551 Pain in right hip: Secondary | ICD-10-CM

## 2021-07-10 DIAGNOSIS — M5441 Lumbago with sciatica, right side: Secondary | ICD-10-CM

## 2021-07-12 ENCOUNTER — Other Ambulatory Visit: Payer: Self-pay | Admitting: Sports Medicine

## 2021-07-12 ENCOUNTER — Telehealth: Payer: Self-pay | Admitting: Sports Medicine

## 2021-07-12 ENCOUNTER — Encounter: Payer: Self-pay | Admitting: *Deleted

## 2021-07-12 MED ORDER — METHOCARBAMOL 500 MG PO TABS: 500.0000 mg | ORAL_TABLET | Freq: Three times a day (TID) | ORAL | 0 refills | Status: DC

## 2021-07-12 NOTE — Telephone Encounter (Signed)
Pt was called and new rx was put in for robaxin 500 mg TID 20 pills no refill ?

## 2021-07-12 NOTE — Progress Notes (Signed)
ax 

## 2021-07-12 NOTE — Telephone Encounter (Signed)
Pt taking Meloxicam and Tizanidine. She called to say that the addition of the Tizandine does not help and makes her very sleepy.  ?

## 2021-07-16 NOTE — Progress Notes (Signed)
? ? Kathleen Jordan D.Judd Gaudier ?[facility] ?[address] [place] [address] ?Phone: [phone] ?  ?Assessment and Plan:   ?  ?1. Abdominal muscle strain, initial encounter ?2. Acute right-sided low back pain without sciatica ?- Ambulatory referral to Physical Therapy  ?-Subacute, mild improvement, subsequent visit ?- Most consistent with strain of right obliques, quadratus lumborum, low back with only mild improvement with meloxicam daily and Robaxin nightly ?- Increase to Robaxin 500 mg 3 times daily as needed ?- Patient unable to fully perform HEP due to pain, so recommend starting physical therapy to help with low back and core strengthening ?- Patient overall did not feel any improvement from OMT at previous office visit, so not repeated today ?-Patient states that she is attempting to lose weight.  I am in full support of her efforts and this may help alleviate pain ? ?Pertinent previous records reviewed include none ?  ?Follow Up: 4 weeks for reevaluation.  If no improvement or worsening of symptoms, could consider advanced imaging ?  ?Subjective:   ? ?I, Jerene Canny, am serving as a Neurosurgeon for [employer] ?  ?Chief Complaint: right side low back pain  ?  ?HPI:  ?  ?07/03/2021 ?Patient is a 51 year old female complaining of right sided low back pain. Patient states that is a non stop pain, that [place], no numbness or tingling, pain radiates all the way across and all the way to the front, has been taking meloxicam and  flexeril at night and those don't seem to be helping, finds relief when she stands, sitting is very uncomfortable , has been sign heat and that doesn't seem to be helping at all  ? ?07/17/2021 ?Patient states this morning is the first morning that the pain is intermittent and not constant and intense  ?  ?Relevant Historical Information: DM type II, hypertension, IBS ? ?Additional pertinent review of systems  negative. ? ? ?Current Outpatient Medications:  ?  cetirizine (ZYRTEC ALLERGY) 10 MG tablet, Take 1 tablet (10 mg total) by mouth daily., Disp: 30 tablet, Rfl: 11 ?  Ciclopirox 1 % shampoo, Massage into scalp , leave 5 min and rinse, Disp: 120 mL, Rfl: 0 ?  dicyclomine (BENTYL) 20 MG tablet, Take 20 mg by mouth every 6 (six) hours., Disp: , Rfl:  ?  empagliflozin (JARDIANCE) 25 MG TABS tablet, Take 25 mg by mouth every evening. , Disp: , Rfl:  ?  EPINEPHrine (AUVI-Q) 0.3 mg/0.3 mL IJ SOAJ injection, Inject 0.3 mLs (0.3 mg total) into the muscle as needed for anaphylaxis., Disp: 1 each, Rfl: 1 ?  ezetimibe (ZETIA) 10 MG tablet, TAKE 1 TABLET(10 MG) BY MOUTH DAILY, Disp: 90 tablet, Rfl: 1 ?  glimepiride (AMARYL) 1 MG tablet, Take by mouth., Disp: , Rfl:  ?  glucose blood (CONTOUR NEXT TEST) test strip, Check BG 3 times/day.  Dx E11.65, Disp: , Rfl:  ?  meloxicam (MOBIC) 15 MG tablet, TAKE 1/2 TO 1 TABLET BY MOUTH EVERY DAY AS NEEDED, Disp: 30 tablet, Rfl: 0 ?  NIFEdipine (PROCARDIA XL/NIFEDICAL XL) 60 MG 24 hr tablet, TAKE 1 TABLET(60 MG) BY MOUTH DAILY, Disp: 90 tablet, Rfl: 1 ?  rosuvastatin (CRESTOR) 10 MG tablet, TAKE 1 TABLET(10 MG) BY MOUTH AT BEDTIME, Disp: 30 tablet, Rfl: 2 ?  spironolactone (ALDACTONE) 25 MG tablet, Take 1 tablet (25 mg total) by mouth daily., Disp: 90 tablet, Rfl: 1 ?  tiZANidine (  ZANAFLEX) 4 MG tablet, Take 1 tablet (4 mg total) by mouth every 8 (eight) hours as needed for muscle spasms., Disp: 40 tablet, Rfl: 0 ?  TRESIBA FLEXTOUCH 200 UNIT/ML FlexTouch Pen, Inject into the skin., Disp: , Rfl:  ?  TRULICITY 3 MG/0.5ML SOPN, Inject into the skin., Disp: , Rfl:  ?  cyclobenzaprine (FLEXERIL) 10 MG tablet, Take 1 tablet (10 mg total) by mouth 3 (three) times daily as needed for muscle spasms. (Patient not taking: Reported on 07/17/2021), Disp: 30 tablet, Rfl: 1 ?  diphenhydrAMINE (BENADRYL) 25 MG tablet, Take 25 mg by mouth every 6 (six) hours as needed for allergies., Disp: , Rfl:  ?   methocarbamol (ROBAXIN) 500 MG tablet, Take 1 tablet (500 mg total) by mouth 3 (three) times daily., Disp: 20 tablet, Rfl: 0  ? ?Objective:   ?  ?Vitals:  ? 07/17/21 0806  ?BP: 120/80  ?Pulse: 79  ?SpO2: 97%  ?Weight: 240 lb (108.9 kg)  ?Height: 5\' 8"  (1.727 m)  ?  ?  ?Body mass index is 36.49 kg/m?.  ?  ?Physical Exam:   ? ?Gen: Appears well, nad, nontoxic and pleasant ?Psych: Alert and oriented, appropriate mood and affect ?Neuro: sensation intact, strength is 5/5 in upper and lower extremities, muscle tone wnl ?Skin: no susupicious lesions or rashes ? ?Back - Normal skin, Spine with normal alignment and no deformity.   ?No tenderness to vertebral process palpation.   ?Paraspinous muscles are not tender and without spasm ?Straight leg raise negative ?Trendelenberg negative  ?TTP right posterior flank ? ?Electronically signed by:  ? D.Kathleen Jordan ?[facility] ?8:25 AM 07/17/21 ?

## 2021-07-17 ENCOUNTER — Ambulatory Visit (INDEPENDENT_AMBULATORY_CARE_PROVIDER_SITE_OTHER): Payer: BC Managed Care – PPO | Admitting: Sports Medicine

## 2021-07-17 VITALS — BP 120/80 | HR 79 | Ht 68.0 in | Wt 240.0 lb

## 2021-07-17 DIAGNOSIS — M545 Low back pain, unspecified: Secondary | ICD-10-CM

## 2021-07-17 DIAGNOSIS — S39011A Strain of muscle, fascia and tendon of abdomen, initial encounter: Secondary | ICD-10-CM

## 2021-07-17 MED ORDER — METHOCARBAMOL 500 MG PO TABS: 500.0000 mg | ORAL_TABLET | Freq: Three times a day (TID) | ORAL | 0 refills | Status: DC

## 2021-07-17 NOTE — Patient Instructions (Addendum)
Good to see you  ?Robaxin refill 500 mg 3 times  a day as needed  ?Referral to PT  ?4 week follow up  ?

## 2021-07-27 ENCOUNTER — Encounter: Payer: Self-pay | Admitting: Family Medicine

## 2021-07-27 ENCOUNTER — Telehealth (INDEPENDENT_AMBULATORY_CARE_PROVIDER_SITE_OTHER): Payer: BC Managed Care – PPO | Admitting: Family Medicine

## 2021-07-27 DIAGNOSIS — J0140 Acute pansinusitis, unspecified: Secondary | ICD-10-CM | POA: Diagnosis not present

## 2021-07-27 MED ORDER — CLARITHROMYCIN ER 500 MG PO TB24: 1000.0000 mg | ORAL_TABLET | Freq: Every day | ORAL | 0 refills | Status: DC

## 2021-07-27 NOTE — Progress Notes (Signed)
? ? ?MyChart Video Visit ? ? ? ?Virtual Visit via Video Note  ? ?This visit type was conducted due to national recommendations for restrictions regarding the COVID-19 Pandemic (e.g. social distancing) in an effort to limit this patient's exposure and mitigate transmission in our community. This patient is at least at moderate risk for complications without adequate follow up. This format is felt to be most appropriate for this patient at this time. Physical exam was limited by quality of the video and audio technology used for the visit. Kathleen Jordan. was able to get the patient set up on a video visit. ? ?Patient location: Car Patient and provider in visit ?Provider location: Office ? ?I discussed the limitations of evaluation and management by telemedicine and the availability of in person appointments. The patient expressed understanding and agreed to proceed. ? ?Visit Date: 07/27/2021 ? ?Today's healthcare provider: [name], DO  ? ?Subjective:  ? ? Patient ID: Kathleen Jordan, female    DOB: [birth year], 51 y.o.   MRN: [id number] ? ?Chief Complaint  ?Patient presents with  ? Sinus Problem  ? ? ?HPI ?Patient is in today for a video visit. ? ?She reports her sinuses have been clogged and despite taking different medications like mucinex and flonase. They have not helped to resolve the symptoms.  ? ?Past Medical History:  ?Diagnosis Date  ? Allergic rhinitis   ? Anal fissure 07/15/2017  ? Chronic constipation   ? Frequency of urination   ? GERD (gastroesophageal reflux disease)   ? History of palpitations   ? Hx of varicella   ? Hyperlipidemia   ? Hypertension   ? Menorrhagia   ? OSA (obstructive sleep apnea)   ? 08-07-2017 no cpap due to finanaces  ? PCOS (polycystic ovarian syndrome)   ? Peripheral neuropathy   ? Thyroid goiter   ? Type 2 diabetes mellitus with hyperglycemia, with long-term current use of insulin (HCC)   ? Urgency of urination   ? ? ?Past Surgical History:  ?Procedure Laterality  Date  ? BREAST REDUCTION SURGERY Bilateral 07-18-2003   dr Shon Hough  [facility]  ? CHOLECYSTECTOMY N/A 02/15/2019  ? Procedure: LAPAROSCOPIC CHOLECYSTECTOMY;  Surgeon: Axel Filler, MD;  Location[facility] OR;  Service: General;  Laterality: N/A;  ? COLONOSCOPY WITH PROPOFOL N/A 07/15/2017  ? Procedure: COLONOSCOPY WITH PROPOFOL;  Surgeon: Hilarie Fredrickson, MD;  Location: [facility];  Service: Endoscopy;  Laterality: N/A;  ? DILITATION & CURRETTAGE/HYSTROSCOPY WITH HYDROTHERMAL ABLATION N/A 03/06/2018  ? Procedure: DILATATION & CURETTAGE/HYSTEROSCOPY WITH HYDROTHERMAL ABLATION, INSERTION OF INTRAUTERINE DEVICE;  Surgeon: Hal Morales, MD;  Location: [facility];  Service: Gynecology;  Laterality: N/A;  ? invitro fertilization  11/16/2010  ? [facility]  ? ? ?Family History  ?Problem Relation Age of Onset  ? Arthritis Mother   ? Diabetes Mother   ? Hypertension Mother   ? Heart disease Mother 21  ?     MI  ? Stroke Mother   ? Congestive Heart Failure Mother   ? Allergic rhinitis Mother   ? Asthma Mother   ? Allergic rhinitis Sister   ? Diabetes Brother   ? Hypertension Brother   ? Arthritis Maternal Grandmother   ? Diabetes Maternal Grandmother   ? Hypertension Maternal Grandmother   ? Cancer Maternal Grandmother   ? Stroke Maternal Grandfather   ? Cancer Maternal Grandfather   ? Asthma Sister   ? Allergies Sister   ? ? ?Social History  ? ?  Socioeconomic History  ? Marital status: Married  ?  Spouse name: Caryn Bee  ? Number of children: 1  ? Years of education: Masters  ? Highest education level: Not on file  ?Occupational History  ? Occupation: Airline pilot  ?  Employer: [employer]  ?  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 [place]  ?Tobacco Use  ? Smoking status: Never  ?  Passive exposure: Yes  ? Smokeless tobacco: Never  ? Tobacco comments:  ?  spouse smokes outside the home  ?Vaping Use  ? Vaping Use: Never used  ?Substance and Sexual Activity  ? Alcohol use: Yes  ?  Comment: social  ? Drug  use: No  ? Sexual activity: Yes  ?  Partners: Male  ?  Birth control/protection: None  ?Other Topics Concern  ? Not on file  ?Social History Narrative  ? Drinks caffeine 2x a week   ? ?Social Determinants of Health  ? ?Financial Resource Strain: Not on file  ?Food Insecurity: Not on file  ?Transportation Needs: Not on file  ?Physical Activity: Not on file  ?Stress: Not on file  ?Social Connections: Not on file  ?Intimate Partner Violence: Not on file  ? ? ?Outpatient Medications Prior to Visit  ?Medication Sig Dispense Refill  ? cetirizine (ZYRTEC ALLERGY) 10 MG tablet Take 1 tablet (10 mg total) by mouth daily. 30 tablet 11  ? Ciclopirox 1 % shampoo Massage into scalp , leave 5 min and rinse 120 mL 0  ? dicyclomine (BENTYL) 20 MG tablet Take 20 mg by mouth every 6 (six) hours.    ? empagliflozin (JARDIANCE) 25 MG TABS tablet Take 25 mg by mouth every evening.     ? EPINEPHrine (AUVI-Q) 0.3 mg/0.3 mL IJ SOAJ injection Inject 0.3 mLs (0.3 mg total) into the muscle as needed for anaphylaxis. 1 each 1  ? ezetimibe (ZETIA) 10 MG tablet TAKE 1 TABLET(10 MG) BY MOUTH DAILY 90 tablet 1  ? glimepiride (AMARYL) 1 MG tablet Take by mouth.    ? glucose blood (CONTOUR NEXT TEST) test strip Check BG 3 times/day.  Dx E11.65    ? meloxicam (MOBIC) 15 MG tablet TAKE 1/2 TO 1 TABLET BY MOUTH EVERY DAY AS NEEDED 30 tablet 0  ? methocarbamol (ROBAXIN) 500 MG tablet Take 1 tablet (500 mg total) by mouth 3 (three) times daily. 20 tablet 0  ? NIFEdipine (PROCARDIA XL/NIFEDICAL XL) 60 MG 24 hr tablet TAKE 1 TABLET(60 MG) BY MOUTH DAILY 90 tablet 1  ? rosuvastatin (CRESTOR) 10 MG tablet TAKE 1 TABLET(10 MG) BY MOUTH AT BEDTIME 30 tablet 2  ? spironolactone (ALDACTONE) 25 MG tablet Take 1 tablet (25 mg total) by mouth daily. 90 tablet 1  ? tiZANidine (ZANAFLEX) 4 MG tablet Take 1 tablet (4 mg total) by mouth every 8 (eight) hours as needed for muscle spasms. 40 tablet 0  ? TRESIBA FLEXTOUCH 200 UNIT/ML FlexTouch Pen Inject into the skin.     ? TRULICITY 3 MG/0.5ML SOPN Inject into the skin.    ? cyclobenzaprine (FLEXERIL) 10 MG tablet Take 1 tablet (10 mg total) by mouth 3 (three) times daily as needed for muscle spasms. (Patient not taking: Reported on 07/17/2021) 30 tablet 1  ? diphenhydrAMINE (BENADRYL) 25 MG tablet Take 25 mg by mouth every 6 (six) hours as needed for allergies.    ? ?No facility-administered medications prior to visit.  ? ? ?No Known Allergies ? ?Review of Systems  ?Constitutional:  Negative for chills  and fever.  ?HENT:  Positive for congestion and sinus pain. Negative for sore throat.   ?Respiratory:  Negative for cough.   ? ?   ?Objective:  ?  ?Physical Exam ?Vitals and nursing note reviewed.  ?Constitutional:   ?   Appearance: Normal appearance. She is not ill-appearing.  ?Pulmonary:  ?   Effort: Pulmonary effort is normal.  ?Neurological:  ?   Mental Status: She is alert and oriented to person, place, and time.  ?Psychiatric:     ?   Behavior: Behavior normal.     ?   Judgment: Judgment normal.  ? ? ?There were no vitals taken for this visit. ?Wt Readings from Last 3 Encounters:  ?07/17/21 240 lb (108.9 kg)  ?07/03/21 241 lb (109.3 kg)  ?06/15/21 238 lb 12.8 oz (108.3 kg)  ? ? ?Diabetic Foot Exam - Simple   ?No data filed ?  ? ?Lab Results  ?Component Value Date  ? WBC 8.6 06/12/2021  ? HGB 14.9 06/12/2021  ? HCT 43 06/12/2021  ? PLT 279 06/12/2021  ? GLUCOSE 169 (H) 02/07/2021  ? CHOL 200 02/18/2020  ? TRIG 82.0 02/18/2020  ? HDL 44.30 02/18/2020  ? LDLDIRECT 139.3 11/20/2010  ? LDLCALC 139 (H) 02/18/2020  ? ALT 57 (A) 06/12/2021  ? AST 38 (A) 06/12/2021  ? NA 139 06/12/2021  ? K 4.0 06/12/2021  ? CL 105 06/12/2021  ? CREATININE 0.9 06/12/2021  ? BUN 17 06/12/2021  ? CO2 25 (A) 06/12/2021  ? TSH 1.89 02/18/2020  ? HGBA1C 13.0 (H) 02/07/2021  ? MICROALBUR 1.2 02/18/2020  ? ? ?Lab Results  ?Component Value Date  ? TSH 1.89 02/18/2020  ? ?Lab Results  ?Component Value Date  ? WBC 8.6 06/12/2021  ? HGB 14.9 06/12/2021  ? HCT 43  06/12/2021  ? MCV 88.6 02/18/2020  ? PLT 279 06/12/2021  ? ?Lab Results  ?Component Value Date  ? NA 139 06/12/2021  ? K 4.0 06/12/2021  ? CO2 25 (A) 06/12/2021  ? GLUCOSE 169 (H) 02/07/2021  ? BUN 17 03

## 2021-07-31 NOTE — Therapy (Incomplete)
?OUTPATIENT PHYSICAL THERAPY THORACOLUMBAR EVALUATION ? ? ?Patient Name: Kathleen Jordan ?MRN: [id number] ?DOB:[birth year], 51 y.o., female ?Today's Date: 07/31/2021 ? ? ? ?Past Medical History:  ?Diagnosis Date  ? Allergic rhinitis   ? Anal fissure 07/15/2017  ? Chronic constipation   ? Frequency of urination   ? GERD (gastroesophageal reflux disease)   ? History of palpitations   ? Hx of varicella   ? Hyperlipidemia   ? Hypertension   ? Menorrhagia   ? OSA (obstructive sleep apnea)   ? 08-07-2017 no cpap due to finanaces  ? PCOS (polycystic ovarian syndrome)   ? Peripheral neuropathy   ? Thyroid goiter   ? Type 2 diabetes mellitus with hyperglycemia, with long-term current use of insulin (HCC)   ? Urgency of urination   ? ?Past Surgical History:  ?Procedure Laterality Date  ? BREAST REDUCTION SURGERY Bilateral 07-18-2003   dr Shon Hough  [facility]  ? CHOLECYSTECTOMY N/A 02/15/2019  ? Procedure: LAPAROSCOPIC CHOLECYSTECTOMY;  Surgeon: Axel Filler, MD;  Location: [facility] OR;  Service: General;  Laterality: N/A;  ? COLONOSCOPY WITH PROPOFOL N/A 07/15/2017  ? Procedure: COLONOSCOPY WITH PROPOFOL;  Surgeon: Hilarie Fredrickson, MD;  Location: [facility];  Service: Endoscopy;  Laterality: N/A;  ? DILITATION & CURRETTAGE/HYSTROSCOPY WITH HYDROTHERMAL ABLATION N/A 03/06/2018  ? Procedure: DILATATION & CURETTAGE/HYSTEROSCOPY WITH HYDROTHERMAL ABLATION, INSERTION OF INTRAUTERINE DEVICE;  Surgeon: Hal Morales, MD;  Location: [facility];  Service: Gynecology;  Laterality: N/A;  ? invitro fertilization  11/16/2010  ? [facility]  ? ?Patient Active Problem List  ? Diagnosis Date Noted  ? Multiple joint pain 06/07/2021  ? Fall 06/03/2021  ? Uncontrolled type 2 diabetes mellitus with hyperglycemia (HCC) 02/07/2021  ? Close exposure to COVID-19 virus 09/19/2020  ? Seborrheic dermatitis of scalp 11/28/2019  ? 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  ? Dysfunction of eustachian tube 11/22/2019  ? Epigastric pain  08/16/2019  ? Other chest pain 08/16/2019  ? Pain of left calf 08/16/2019  ? RUQ pain 08/16/2019  ? Body aches 05/12/2019  ? History of frequent URI 05/12/2019  ? Acute non-recurrent pansinusitis 03/12/2019  ? Perennial allergic rhinitis 01/22/2019  ? Adverse food reaction 01/22/2019  ? Shortness of breath 01/22/2019  ? Allergic conjunctivitis of both eyes 01/22/2019  ? Hyperlipidemia associated with type 2 diabetes mellitus (HCC) 06/13/2018  ? Menorrhagia 08/13/2017  ? Simple endometrial hyperplasia without atypia 08/13/2017  ? Gastroesophageal reflux disease 07/24/2017  ? Rectal bleeding 04/02/2017  ? Anal fissure 04/02/2017  ? Constipation 04/02/2017  ? Vaginal itching 01/31/2017  ? Preventative health care 06/20/2016  ? IBS (irritable bowel syndrome) 09/07/2015  ? Knee pain, acute 06/01/2014  ? Obesity (BMI 30-39.9) 06/15/2013  ? Postpartum care following vaginal delivery (8/15) 11/13/2012  ? Frequent sinus infections 07/02/2011  ? OSA (obstructive sleep apnea) 12/06/2010  ? IDDM (insulin dependent diabetes mellitus) (HCC) 11/23/2010  ? HEADACHE 12/08/2009  ? POLYCYSTIC OVARIES 08/02/2009  ? MORBID OBESITY 06/29/2009  ? GOITER, SIMPLE 01/10/2009  ? DYSPHAGIA UNSPECIFIED 01/10/2009  ? ABSCESS, SKIN 11/10/2007  ? Hypertension 05/21/2007  ? SINUSITIS- ACUTE-NOS 05/21/2007  ? Essential hypertension 09/18/2006  ? ? ?PCP: Zola Button, Agnes Lawrence, DO ? ?REFERRING PROVIDER: Richardean Sale DO ? ?REFERRING DIAG: S39.011A (ICD-10-CM) - Abdominal muscle strain, initial encounter ?M54.50 (ICD-10-CM) - Acute right-sided low back pain without  ? ?THERAPY DIAG:  ?No diagnosis found. ? ?ONSET DATE: *** ? ?SUBJECTIVE:                                                                                                                                                                                          ? ?  SUBJECTIVE STATEMENT: ?*** ?PERTINENT HISTORY:  ?*** ? ?PAIN:  ?Are you having pain? Yes: {yespain:27235::"NPRS scale:  ***/10","Pain location: ***","Pain description: ***","Aggravating factors: ***","Relieving factors: ***"} ? ? ?PRECAUTIONS: None ? ?WEIGHT BEARING RESTRICTIONS No ? ?FALLS:  ?Has patient fallen in last 6 months? No ? ?LIVING ENVIRONMENT: ?Lives with: {[facility] lives with:25569::"lives with their family"} ?Lives in: {Lives in:25570} ?Stairs: {opstairs:27293} ?Has following equipment at home: {Assistive devices:23999} ? ?OCCUPATION: *** ? ?PLOF: Independent ? ?PATIENT GOALS *** ? ? ?OBJECTIVE:  ? ?DIAGNOSTIC FINDINGS:  ?No acute osseous finding from X-ray performed on 06/15/21 ? ?PATIENT SURVEYS:  ?{rehab surveys:24030} ? ? ?COGNITION: ? Overall cognitive status: Within functional limits for tasks assessed   ?  ?SENSATION: ?WFL ? ?MUSCLE LENGTH: ?Hamstrings: Right *** deg; Left *** deg ?Thomas test: Right *** deg; Left *** deg ? ?POSTURE:  ?*** ? ?PALPATION: ?*** ? ?LUMBAR ROM:  ? ?Active  A/PROM  ?07/31/2021  ?Flexion   ?Extension   ?Right lateral flexion   ?Left lateral flexion   ?Right rotation   ?Left rotation   ? (Blank rows = not tested) ? ?LE ROM: ? ?Active  Right ?07/31/2021 Left ?07/31/2021  ?Hip flexion    ?Hip extension    ?Hip abduction    ?Hip adduction    ?Hip internal rotation    ?Hip external rotation    ?Knee flexion    ?Knee extension    ?Ankle dorsiflexion    ?Ankle plantarflexion    ?Ankle inversion    ?Ankle eversion    ? (Blank rows = not tested) ? ?LE MMT: ? ?MMT Right ?07/31/2021 Left ?07/31/2021  ?Hip flexion    ?Hip extension    ?Hip abduction    ?Hip adduction    ?Hip internal rotation    ?Hip external rotation    ?Knee flexion    ?Knee extension    ?Ankle dorsiflexion    ?Ankle plantarflexion    ?Ankle inversion    ?Ankle eversion    ? (Blank rows = not tested) ? ?LUMBAR SPECIAL TESTS:  ?{lumbar special test:25242} ? ?FUNCTIONAL TESTS:  ?{Functional tests:24029} ? ?GAIT: ?Distance walked: *** ?Assistive device utilized: {Assistive devices:23999} ?Level of assistance: {Levels of  assistance:24026} ?Comments: *** ? ? ? ?TODAY'S TREATMENT  ?*** ? ? ?PATIENT EDUCATION:  ?Education details: PT POC, HEP ?Person educated: Patient ?Education method: Explanation, Demonstration, Tactile cues, Verbal cues, and Handouts ?Education comprehension: verbalized understanding, returned demonstration, and needs further education ? ? ?HOME EXERCISE PROGRAM: ?*** ? ?ASSESSMENT: ? ?CLINICAL IMPRESSION: ?Patient is a 51 y.o. female who comes to clinic with complaints of Rt sided low back pain and abdominal strain with pain. Pt with mobility, strength and movement coordination deficits that impair their ability to perform usual daily and recreational functional activities without increase difficulty/symptoms at this time.  Patient to benefit from skilled PT services to address impairments and limitations to improve to previous level of function without restriction secondary to condition.  ? ? ?OBJECTIVE IMPAIRMENTS {opptimpairments:25111}.  ? ?ACTIVITY LIMITATIONS {activity limitations:25113}.  ? ?PERSONAL FACTORS {Personal factors:25162} are also affecting patient's functional outcome.  ? ? ?REHAB POTENTIAL: {rehabpotential:25112} ? ?CLINICAL DECISION MAKING: {clinical decision making:25114} ? ?EVALUATION COMPLEXITY: {Evaluation complexity:25115} ? ? ?GOALS: ?Goals reviewed with patient? Yes ? ?Short term PT Goals (target date for Short term goals are *** weeks ***) ?Patient will demonstrate independent use of home exercise program to maintain progress from in clinic treatments. ?Goal status: New ?  ?Long term PT goals (target dates for all long term  goals are *** weeks  *** ) ?Patient will demonstrate/report pain at worst less than or equal to 2/10 to facilitate minimal limitation in daily activity secondary to pain symptoms. ?Goal status: New ? ?Patient will demonstrate independent use of home exercise program to facilitate ability to maintain/progress functional gains from skilled physical therapy  services. ?Goal status: New ? ?Patient will demonstrate FOTO outcome > or = *** % to indicate reduced disability due to condition. ?Goal status: New ? ?*** ?Goal status: New ? ?    5.  *** ?Goal status: New ? ? ? ?PLAN: ?PT FREQUENCY: {rehab frequency:25116

## 2021-08-01 ENCOUNTER — Ambulatory Visit: Payer: BC Managed Care – PPO | Admitting: Physical Therapy

## 2021-08-09 ENCOUNTER — Other Ambulatory Visit: Payer: Self-pay | Admitting: Family Medicine

## 2021-08-09 DIAGNOSIS — M5441 Lumbago with sciatica, right side: Secondary | ICD-10-CM

## 2021-08-09 DIAGNOSIS — M25551 Pain in right hip: Secondary | ICD-10-CM

## 2021-08-13 ENCOUNTER — Ambulatory Visit: Payer: BC Managed Care – PPO | Admitting: Physical Therapy

## 2021-08-13 ENCOUNTER — Encounter: Payer: Self-pay | Admitting: Physical Therapy

## 2021-08-13 DIAGNOSIS — M6281 Muscle weakness (generalized): Secondary | ICD-10-CM | POA: Diagnosis not present

## 2021-08-13 DIAGNOSIS — M545 Low back pain, unspecified: Secondary | ICD-10-CM

## 2021-08-13 NOTE — Therapy (Addendum)
?OUTPATIENT PHYSICAL THERAPY THORACOLUMBAR EVALUATION ? ? ?Patient Name: Kathleen Jordan ?MRN: [id number] ?DOB:06-10-1970, 51 y.o., female ?Today's Date: 08/13/2021 ? ? PT End of Session - 08/13/21 1209   ? ? Visit Number 1   ? Number of Visits 9   ? Date for PT Re-Evaluation 10/12/21   ? Authorization Type BCBS   ? PT Start Time [phone]   ? PT Stop Time 0845   ? PT Time Calculation (min) 33 min   ? Activity Tolerance Patient tolerated treatment well   ? Behavior During Therapy [facility] for tasks assessed/performed   ? ?  ?  ? ?  ? ? ?Past Medical History:  ?Diagnosis Date  ? Allergic rhinitis   ? Anal fissure 07/15/2017  ? Chronic constipation   ? Frequency of urination   ? GERD (gastroesophageal reflux disease)   ? History of palpitations   ? Hx of varicella   ? Hyperlipidemia   ? Hypertension   ? Menorrhagia   ? OSA (obstructive sleep apnea)   ? 08-07-2017 no cpap due to finanaces  ? PCOS (polycystic ovarian syndrome)   ? Peripheral neuropathy   ? Thyroid goiter   ? Type 2 diabetes mellitus with hyperglycemia, with long-term current use of insulin (HCC)   ? Urgency of urination   ? ?Past Surgical History:  ?Procedure Laterality Date  ? BREAST REDUCTION SURGERY Bilateral 07-18-2003   dr Shon Hough  [facility]  ? CHOLECYSTECTOMY N/A 02/15/2019  ? Procedure: LAPAROSCOPIC CHOLECYSTECTOMY;  Surgeon: Axel Filler, MD;  Location: [facility] OR;  Service: General;  Laterality: N/A;  ? COLONOSCOPY WITH PROPOFOL N/A 07/15/2017  ? Procedure: COLONOSCOPY WITH PROPOFOL;  Surgeon: Hilarie Fredrickson, MD;  Location: [facility];  Service: Endoscopy;  Laterality: N/A;  ? DILITATION & CURRETTAGE/HYSTROSCOPY WITH HYDROTHERMAL ABLATION N/A 03/06/2018  ? Procedure: DILATATION & CURETTAGE/HYSTEROSCOPY WITH HYDROTHERMAL ABLATION, INSERTION OF INTRAUTERINE DEVICE;  Surgeon: Hal Morales, MD;  Location: [facility];  Service: Gynecology;  Laterality: N/A;  ? invitro fertilization  11/16/2010  ? [facility]  ? ?Patient Active Problem  List  ? Diagnosis Date Noted  ? Multiple joint pain 06/07/2021  ? Fall 06/03/2021  ? Uncontrolled type 2 diabetes mellitus with hyperglycemia (HCC) 02/07/2021  ? Close exposure to COVID-19 virus 09/19/2020  ? Seborrheic dermatitis of scalp 11/28/2019  ? 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  ? Dysfunction of eustachian tube 11/22/2019  ? Epigastric pain 08/16/2019  ? Other chest pain 08/16/2019  ? Pain of left calf 08/16/2019  ? RUQ pain 08/16/2019  ? Body aches 05/12/2019  ? History of frequent URI 05/12/2019  ? Acute non-recurrent pansinusitis 03/12/2019  ? Perennial allergic rhinitis 01/22/2019  ? Adverse food reaction 01/22/2019  ? Shortness of breath 01/22/2019  ? Allergic conjunctivitis of both eyes 01/22/2019  ? Hyperlipidemia associated with type 2 diabetes mellitus (HCC) 06/13/2018  ? Menorrhagia 08/13/2017  ? Simple endometrial hyperplasia without atypia 08/13/2017  ? Gastroesophageal reflux disease 07/24/2017  ? Rectal bleeding 04/02/2017  ? Anal fissure 04/02/2017  ? Constipation 04/02/2017  ? Vaginal itching 01/31/2017  ? Preventative health care 06/20/2016  ? IBS (irritable bowel syndrome) 09/07/2015  ? Knee pain, acute 06/01/2014  ? Obesity (BMI 30-39.9) 06/15/2013  ? Postpartum care following vaginal delivery (8/15) 11/13/2012  ? Frequent sinus infections 07/02/2011  ? OSA (obstructive sleep apnea) 12/06/2010  ? IDDM (insulin dependent diabetes mellitus) (HCC) 11/23/2010  ? HEADACHE 12/08/2009  ? POLYCYSTIC OVARIES 08/02/2009  ? MORBID OBESITY 06/29/2009  ?  GOITER, SIMPLE 01/10/2009  ? DYSPHAGIA UNSPECIFIED 01/10/2009  ? ABSCESS, SKIN 11/10/2007  ? Hypertension 05/21/2007  ? SINUSITIS- ACUTE-NOS 05/21/2007  ? Essential hypertension 09/18/2006  ? ? ?PCP: Lelon PerlaLowne, Yvonne R. DO ? ?REFERRING PROVIDER: Richardean SaleJackson, Benjamin, DO ? ?REFERRING DIAG: S39.011A (ICD-10-CM) - Abdominal muscle strain, initial encounter ?M54.50 (ICD-10-CM) - Acute right-sided low back pain without  ? ?THERAPY DIAG:  ?Acute right-sided low  back pain without sciatica - Plan: PT plan of care cert/re-cert ? ?Muscle weakness (generalized) - Plan: PT plan of care cert/re-cert ? ?ONSET DATE: 05/25/2021 due to fall when she was looking up [place], she tripped over rubber mat that was covering wires/cords.   ? ?SUBJECTIVE:                                                                                                                                                                                          ? ?SUBJECTIVE STATEMENT:  ?Pt arriving today reporting [place] 2[place]24[place] Pt reporting increased pain on the right sided low back into right abdomen. Pt reporting no pains down her Rt LE.  ? ? ?PERTINENT HISTORY:  ?DM type II, hypertension, IBS, pain in left calf, SOB, knee pain, obesity, HTN ? ?PAIN:  ?Are you having pain? Yes: NPRS scale: 6/10 ?Pain location: Rt side and extends into abdomen ?Pain description: throbbing ?Aggravating factors: sitting prolonged ?Relieving factors: change positions, walking ? ? ?PRECAUTIONS: None ? ?WEIGHT BEARING RESTRICTIONS No ? ?FALLS:  ?Has patient fallen in last 6 months? Yes. Number of falls once on 05/25/21 ? ?LIVING ENVIRONMENT: ?Lives with: lives with their family and lives with their spouse ?Lives in: House/apartment ?Stairs: No ?Has following equipment at home:  none ? 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 ?PLOF: Independent ? ?PATIENT GOALS: "no more pain", "do my job without interruptions from pain" ? ? ?OBJECTIVE:  ? ?DIAGNOSTIC FINDINGS:  ?Negative for pars defect. Symmetric appearance of the SI joints. ?There is an IUD in the pelvis. Vertebral body heights and disc ?spaces are maintained. Mild degenerative endplate changes. Normal ?alignment. ? ?PATIENT SURVEYS:  ?08/13/2021: FOTO Perform next visit, pt arriving late to evaluation ? ?SCREENING FOR RED FLAGS: ?Bowel or bladder incontinence: No ?Spinal tumors: No ?Cauda equina syndrome: No ?Compression  fracture: No ?Abdominal aneurysm: No ? ?COGNITION: ? 08/13/2021: Overall cognitive status: Within functional limits for tasks assessed   ?  ?[facility] ? ?MUSCLE LENGTH: ?08/13/2021: Hamstrings: Right 54 deg; Left 68 deg ? ? ?POSTURE:  ?08/13/2021: Forward head and rounded shoulders, decreased lumbar lordosis ? ?PALPATION: ?08/13/2021: TTP: Lumbar L2-L5 paraspinals on  the right ? ?LUMBAR ROM:  ? ?Active  A/PROM  ?08/13/2021  ?Flexion 45  ?Extension 20  ?Right lateral flexion 32  ?Left lateral flexion 34  ?Right rotation Limited 50% ?  ?Left rotation Limited 25%   ? (Blank rows = not tested) ? ? ? ?LE MMT: ? ?MMT Right ?08/13/2021 Left ?08/13/2021  ?Hip flexion 4+/5 4/5  ?Hip extension    ?Hip abduction 5/5 5/5  ?Hip adduction 5/5 5/5  ?Hip internal rotation    ?Hip external rotation    ?Knee flexion 5/5 5/5  ?Knee extension 5/5 5/5  ?Ankle dorsiflexion 5/5 5/5  ?Ankle plantarflexion 5/5 5/5  ?Ankle inversion    ?Ankle eversion    ? (Blank rows = not tested) ? ?LUMBAR SPECIAL TESTS:  ?08/13/2021 Slump test: Positive on the Rt ?08/13/2021: Straight Leg Raise: negative on Rt ? ?FUNCTIONAL TESTS:  ?08/13/2021 5 times sit to stand: 15 seconds UE support ? ?GAIT: ?Distance walked: 30 feet ?Assistive device utilized: None ?Level of assistance: Complete Independence ?Comments: step through gait pattern  ? ? ? ?TODAY'S TREATMENT  ?HEP instruction/performance c cues for techniques, handout provided.  Trial set performed of each for comprehension and symptom assessment.  See below for exercise list. ? ? ?PATIENT EDUCATION:  ?Education details: PT POC, HEP, Discussed DN at future visits ?Person educated: Patient ?Education method: Explanation, Demonstration, Tactile cues, Verbal cues, and Handouts ?Education comprehension: verbalized understanding and returned demonstration ? ? ?HOME EXERCISE PROGRAM: ?Access Code: [email or web address] ?URL: [email or web address] ?Date: 08/13/2021 ?Prepared by: Narda Amber ? ?Exercises ?- Seated Hamstring Stretch  - 2 x daily - 7 x weekly - 3-5 reps - 30 seconds hold ?- Hooklying Hamstring Stretch with Strap  - 2 x daily - 7 x weekly - 3-5 reps - 30 seconds hold ?- Supine Lower T

## 2021-08-14 ENCOUNTER — Ambulatory Visit: Payer: BC Managed Care – PPO | Admitting: Sports Medicine

## 2021-08-16 NOTE — Progress Notes (Signed)
Aleen Sells D.[facility] [address] [place] [address] Phone: [phone]   Assessment and Plan:     1. Abdominal muscle strain, initial encounter 2. 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  Patient is unable to physically keep up with all of these physical demands and the only demand where she can decrease her physical participation is night school.  Because of this, I have given patient a school note that says patient should be excused from this semester due to physical limitations.  Prolonged sitting is causing flares and exacerbations of patient's pain - Continue Robaxin 500 mg 3 times a day as needed.  Refill provided - Continue physical therapy   Pertinent previous records reviewed include none   Follow Up: 4 weeks for reevaluation.  Could consider advanced imaging if no improvement or worsening of symptoms   Subjective:   I, Moenique Parris, am serving as a Neurosurgeon for Doctor Richardean Sale   Chief Complaint: right side low back pain    HPI:    07/03/2021 Patient is a 51 year old female complaining of right sided low back pain. Patient states that is a non stop pain, that started in feb [place], no numbness or tingling, pain radiates all the way across and all the way to the front, has been taking meloxicam and  flexeril at night and those don't seem to be helping, finds relief when she stands, sitting is very uncomfortable , has been sign heat and that doesn't seem to be helping at all    07/17/2021 Patient states this morning is the first morning that the pain is intermittent and not constant and intense     08/17/2021 Patient states shes is doing okay , still cant sit for a long period of time just started PT has to get used to it    Relevant Historical Information: DM type II, hypertension, IBS  Additional pertinent review of systems negative.   Current Outpatient Medications:    cetirizine (ZYRTEC ALLERGY) 10 MG tablet, Take 1 tablet (10 mg total) by mouth daily., Disp: 30 tablet, Rfl: 11   clarithromycin (BIAXIN XL) 500 MG 24 hr tablet, Take 2 tablets (1,000 mg total) by mouth daily., Disp: 20 tablet, Rfl: 0   cyclobenzaprine (FLEXERIL) 10 MG tablet, Take 1 tablet (10 mg total) by mouth 3 (three) times daily as needed for muscle spasms., Disp: 30 tablet, Rfl: 1   dicyclomine (BENTYL) 20 MG tablet, Take 20 mg by mouth every 6 (six) hours., Disp: , Rfl:    empagliflozin (JARDIANCE) 25 MG TABS tablet, Take 25 mg by mouth every evening. , Disp: , Rfl:    EPINEPHrine (AUVI-Q) 0.3 mg/0.3 mL IJ SOAJ injection, Inject 0.3 mLs (0.3 mg total) into the muscle as needed for anaphylaxis., Disp: 1 each, Rfl: 1   ezetimibe (ZETIA) 10 MG tablet, TAKE 1 TABLET(10 MG) BY MOUTH DAILY, Disp: 90 tablet, Rfl: 1   glimepiride (AMARYL) 1 MG tablet, Take by mouth., Disp: , Rfl:    glucose blood (CONTOUR NEXT TEST) test strip, Check BG 3 times/day.  Dx E11.65, Disp: , Rfl:    meloxicam (MOBIC) 15 MG tablet, TAKE 1/2 TO 1 TABLET BY  MOUTH EVERY DAY AS NEEDED, Disp: 30 tablet, Rfl: 0   NIFEdipine (PROCARDIA XL/NIFEDICAL XL) 60 MG 24 hr tablet, TAKE 1 TABLET(60 MG) BY MOUTH DAILY, Disp: 90 tablet, Rfl: 1   rosuvastatin (CRESTOR) 10 MG tablet, TAKE 1 TABLET(10 MG) BY MOUTH AT BEDTIME, Disp: 30 tablet, Rfl: 2   spironolactone (ALDACTONE) 25 MG tablet, Take 1 tablet (25 mg total) by mouth daily., Disp: 90 tablet, Rfl: 1   tiZANidine (ZANAFLEX) 4 MG tablet, Take 1 tablet (4 mg total) by mouth every 8 (eight) hours as needed for muscle spasms., Disp: 40 tablet, Rfl: 0   TRESIBA FLEXTOUCH 200 UNIT/ML FlexTouch Pen, Inject into  the skin., Disp: , Rfl:    TRULICITY 3 0000000 SOPN, Inject into the skin., Disp: , Rfl:    Ciclopirox 1 % shampoo, Massage into scalp , leave 5 min and rinse, Disp: 120 mL, Rfl: 0   methocarbamol (ROBAXIN) 500 MG tablet, Take 1 tablet (500 mg total) by mouth 3 (three) times daily., Disp: 60 tablet, Rfl: 0   Objective:     Vitals:   08/17/21 0803  BP: 100/80  Pulse: 72  SpO2: 98%  Weight: 243 lb (110.2 kg)  Height: 5\' 8"  (1.727 m)      Body mass index is 36.95 kg/m.    Physical Exam:   Gen: Appears well, nad, nontoxic and pleasant Psych: Alert and oriented, appropriate mood and affect Neuro: sensation intact, strength is 5/5 in upper and lower extremities, muscle tone wnl Skin: no susupicious lesions or rashes   Back - Normal skin, Spine with normal alignment and no deformity.   No tenderness to vertebral process palpation.   Paraspinous muscles are not tender and without spasm Straight leg raise negative Trendelenberg negative  TTP right posterior flank   Electronically signed by:  Benito Mccreedy D.[facility] 8:38 AM 08/17/21

## 2021-08-17 ENCOUNTER — Ambulatory Visit: Payer: BC Managed Care – PPO | Admitting: Sports Medicine

## 2021-08-17 VITALS — BP 100/80 | HR 72 | Ht 68.0 in | Wt 243.0 lb

## 2021-08-17 DIAGNOSIS — S39011A Strain of muscle, fascia and tendon of abdomen, initial encounter: Secondary | ICD-10-CM

## 2021-08-17 DIAGNOSIS — M545 Low back pain, unspecified: Secondary | ICD-10-CM | POA: Diagnosis not present

## 2021-08-17 MED ORDER — METHOCARBAMOL 500 MG PO TABS: 500.0000 mg | ORAL_TABLET | Freq: Three times a day (TID) | ORAL | 0 refills | Status: DC

## 2021-08-17 NOTE — Patient Instructions (Addendum)
Good to see you  School note provided  Refill robaxin three times a day as needed Continue PT  4 week follow up

## 2021-08-30 ENCOUNTER — Encounter: Payer: Self-pay | Admitting: Rehabilitative and Restorative Service Providers"

## 2021-08-30 ENCOUNTER — Ambulatory Visit: Payer: BC Managed Care – PPO | Admitting: Rehabilitative and Restorative Service Providers"

## 2021-08-30 DIAGNOSIS — M6281 Muscle weakness (generalized): Secondary | ICD-10-CM | POA: Diagnosis not present

## 2021-08-30 DIAGNOSIS — M545 Low back pain, unspecified: Secondary | ICD-10-CM | POA: Diagnosis not present

## 2021-08-30 NOTE — Therapy (Signed)
OUTPATIENT PHYSICAL THERAPY TREATMENT NOTE   Patient Name: Kathleen Jordan MRN: [id number] DOB:07/18/1970, 51 y.o., female Today's Date: 08/30/2021  PCP: Ann Held, DO REFERRING PROVIDER: Glennon Mac, DO  END OF SESSION:   PT End of Session - 08/30/21 0945     Visit Number 2    Number of Visits 9    Date for PT Re-Evaluation 10/12/21    Authorization Type BCBS    PT Start Time 0809    PT Stop Time 0847    PT Time Calculation (min) 38 min    Activity Tolerance Patient tolerated treatment well    Behavior During Therapy [facility] for tasks assessed/performed             Past Medical History:  Diagnosis Date   Allergic rhinitis    Anal fissure 07/15/2017   Chronic constipation    Frequency of urination    GERD (gastroesophageal reflux disease)    History of palpitations    Hx of varicella    Hyperlipidemia    Hypertension    Menorrhagia    OSA (obstructive sleep apnea)    08-07-2017 no cpap due to finanaces   PCOS (polycystic ovarian syndrome)    Peripheral neuropathy    Thyroid goiter    Type 2 diabetes mellitus with hyperglycemia, with long-term current use of insulin ([place])    Urgency of urination    Past Surgical History:  Procedure Laterality Date   BREAST REDUCTION SURGERY Bilateral 07-18-2003   dr Towanda Malkin  [facility]   CHOLECYSTECTOMY N/A 02/15/2019   Procedure: LAPAROSCOPIC CHOLECYSTECTOMY;  Surgeon: Ralene Ok, MD;  Location: [facility];  Service: General;  Laterality: N/A;   COLONOSCOPY WITH PROPOFOL N/A 07/15/2017   Procedure: COLONOSCOPY WITH PROPOFOL;  Surgeon: Irene Shipper, MD;  Location: [facility];  Service: Endoscopy;  Laterality: N/A;   DILITATION & CURRETTAGE/HYSTROSCOPY WITH HYDROTHERMAL ABLATION N/A 03/06/2018   Procedure: DILATATION & CURETTAGE/HYSTEROSCOPY WITH HYDROTHERMAL ABLATION, INSERTION OF INTRAUTERINE DEVICE;  Surgeon: Eldred Manges, MD;  Location: Home;  Service: Gynecology;  Laterality:  N/A;   invitro fertilization  11/16/2010   [facility]   Patient Active Problem List   Diagnosis Date Noted   Multiple joint pain 06/07/2021   Fall 06/03/2021   Uncontrolled type 2 diabetes mellitus with hyperglycemia ([place]) 02/07/2021   Close exposure to COVID-19 virus 09/19/2020   Seborrheic dermatitis of scalp 11/28/2019   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   Dysfunction of eustachian tube 11/22/2019   Epigastric pain 08/16/2019   Other chest pain 08/16/2019   Pain of left calf 08/16/2019   RUQ pain 08/16/2019   Body aches 05/12/2019   History of frequent URI 05/12/2019   Acute non-recurrent pansinusitis 03/12/2019   Perennial allergic rhinitis 01/22/2019   Adverse food reaction 01/22/2019   Shortness of breath 01/22/2019   Allergic conjunctivitis of both eyes 01/22/2019   Hyperlipidemia associated with type 2 diabetes mellitus ([place]) 06/13/2018   Menorrhagia 08/13/2017   Simple endometrial hyperplasia without atypia 08/13/2017   Gastroesophageal reflux disease 07/24/2017   Rectal bleeding 04/02/2017   Anal fissure 04/02/2017   Constipation 04/02/2017   Vaginal itching 01/31/2017   Preventative health care 06/20/2016   IBS (irritable bowel syndrome) 09/07/2015   Knee pain, acute 06/01/2014   Obesity (BMI 30-39.9) 06/15/2013   Postpartum care following vaginal delivery (8/15) 11/13/2012   Frequent sinus infections 07/02/2011   OSA (obstructive sleep apnea) 12/06/2010   IDDM (insulin dependent diabetes mellitus) ([place]) 11/23/2010  HEADACHE 12/08/2009   POLYCYSTIC OVARIES 08/02/2009   MORBID OBESITY 06/29/2009   GOITER, SIMPLE 01/10/2009   DYSPHAGIA UNSPECIFIED 01/10/2009   ABSCESS, SKIN 11/10/2007   Hypertension 05/21/2007   SINUSITIS- ACUTE-NOS 05/21/2007   Essential hypertension 09/18/2006    REFERRING DIAG: S39.011A (ICD-10-CM) - Abdominal muscle strain, initial encounter M54.50 (ICD-10-CM) - Acute right-sided low back pain without   THERAPY DIAG:  Acute  right-sided low back pain without sciatica  Muscle weakness (generalized)  Rationale for Evaluation and Treatment Rehabilitation  PERTINENT HISTORY: DM type II, hypertension, IBS, pain in left calf, SOB, knee pain, obesity, HTN  PRECAUTIONS: Back (avoid flexed postures, slouched postures)  SUBJECTIVE: Ariabella notes difficulty with prolonged sitting (at her desk and driving).  She is using a muscle relaxer (2) to help manage pain.  PAIN:  Are you having pain? Yes: NPRS scale: 3-5/10 Pain location: R side abdominal to lower back Pain description: Dull Aggravating factors: Prolonged sitting Relieving factors: Movement and avoiding prolonged postures   OBJECTIVE: (objective measures completed at initial evaluation unless otherwise dated)  DIAGNOSTIC FINDINGS:  Negative for pars defect. Symmetric appearance of the SI joints. There is an IUD in the pelvis. Vertebral body heights and disc spaces are maintained. Mild degenerative endplate changes. Normal alignment.   PATIENT SURVEYS:  08/13/2021: FOTO Perform next visit, pt arriving late to evaluation   SCREENING FOR RED FLAGS: Bowel or bladder incontinence: No Spinal tumors: No Cauda equina syndrome: No Compression fracture: No Abdominal aneurysm: No   COGNITION:            08/13/2021: Overall cognitive status: Within functional limits for tasks assessed                                 SENSATION: [facility] 5/15/ 2023   MUSCLE LENGTH: 08/13/2021: Hamstrings: Right 54 deg; Left 68 deg     POSTURE:  08/13/2021: Forward head and rounded shoulders, decreased lumbar lordosis   PALPATION: 08/13/2021: TTP: Lumbar L2-L5 paraspinals on the right   LUMBAR ROM:    Active  A/PROM  08/13/2021  Flexion 45  Extension 20  Right lateral flexion 32  Left lateral flexion 34  Right rotation Limited 50%    Left rotation Limited 25%    (Blank rows = not tested)       LE MMT:   MMT Right 08/13/2021 Left 08/13/2021  Hip flexion 4+/5 4/5   Hip extension      Hip abduction 5/5 5/5  Hip adduction 5/5 5/5  Hip internal rotation      Hip external rotation      Knee flexion 5/5 5/5  Knee extension 5/5 5/5  Ankle dorsiflexion 5/5 5/5  Ankle plantarflexion 5/5 5/5  Ankle inversion      Ankle eversion       (Blank rows = not tested)   LUMBAR SPECIAL TESTS:  08/13/2021 Slump test: Positive on the Rt 08/13/2021: Straight Leg Raise: negative on Rt   FUNCTIONAL TESTS:  08/13/2021 5 times sit to stand: 15 seconds UE support   GAIT: Distance walked: 30 feet Assistive device utilized: None Level of assistance: Complete Independence Comments: step through gait pattern        TODAY'S TREATMENT  08/30/2021 Pelvic/trunk rotation 10X 10 seconds Bridging 10X 5 seconds Figure 4 stretch 3X 20 seconds Piriformis stretch with 1 foot on ground 3X 20 seconds Hamstrings stretch supine with other leg straight 3X 20 seconds  Trunk extension AROM 10X 3 seconds Shoulder blade pinches 10X 5 seconds Hip hike 2 sets of 5 for 3 seconds  Functional Activities: Spine anatomy, education on changing position frequently, lumbar roll use and a home walking program.   08/13/2021 HEP instruction/performance c cues for techniques, handout provided.  Trial set performed of each for comprehension and symptom assessment.  See below for exercise list.     PATIENT EDUCATION:  Education details: PT POC, HEP, Discussed DN at future visits Person educated: Patient Education method: Explanation, Demonstration, Tactile cues, Verbal cues, and Handouts Education comprehension: verbalized understanding and returned demonstration     HOME EXERCISE PROGRAM: Access Code: [email or web address] URL: [email or web address] Date: 08/30/2021 Prepared by: Vista Mink  Exercises - Seated Hamstring Stretch  - 2 x daily - 7 x weekly - 3-5 reps - 30 seconds hold - Hooklying Hamstring Stretch with Strap  - 2 x daily - 7 x weekly - 3-5 reps - 30 seconds hold -  Supine Lower Trunk Rotation  - 2 x daily - 7 x weekly - 3 reps - 30 seconds hold - Supine Bridge  - 2 x daily - 7 x weekly - 2 sets - 10 reps - 5 seconds hold - Supine Piriformis Stretch with Foot on Ground  - 2 x daily - 7 x weekly - 3 reps - 20-30 seconds hold - Supine Figure 4 Piriformis Stretch  - 2 x daily - 7 x weekly - 3 reps - 20-30 seconds hold - Standing Lumbar Extension at Wall - Forearms  - 5 x daily - 7 x weekly - 1 sets - 5 reps - 3 seconds hold - Standing Scapular Retraction  - 5 x daily - 7 x weekly - 1 sets - 5 reps - 5 second hold   ASSESSMENT:   CLINICAL IMPRESSION: Chamara repots inconsistent HEP compliance since starting PT 2 weeks ago.  We reviewed her HEP and she was encouraged to try to get exercises in before and after work as both sessions should take no more than 20-25 minutes.  We also added activities she can do during the day to complement her early excellent HEP.  I encouraged her to walk 20+ minutes 3X/week and avoid flexed, slouched postures along with use a lumbar roll when seated and take frequent breaks to change position.  Her prognosis remains good with the recommended POC.     OBJECTIVE IMPAIRMENTS decreased mobility, difficulty walking, decreased ROM, decreased strength, impaired flexibility, obesity, and pain.    ACTIVITY LIMITATIONS community activity, driving, and occupation.    PERSONAL FACTORS DM type II, hypertension, IBS, pain in left calf, SOB, knee pain, obesity, HTN are also affecting patient's functional outcome.      REHAB POTENTIAL: Good   CLINICAL DECISION MAKING: Stable/uncomplicated   EVALUATION COMPLEXITY: Low     GOALS: Goals reviewed with patient? Yes Short term PT Goals (target date for Short term goals are 4 weeks 09/14/2021) Patient will demonstrate independent use of initial home exercise program to maintain progress from in clinic treatments. Goal status: Good recall but inconsistent compliance 08/30/2021      2. Pt will be  able to demonstrate correct body mechanics when lifting 10# item from floor to counter height demonstrating correct body mechanics.                   A.  Goal Status: New     Long term PT goals (target dates for all long term goals are 8 weeks  10/12/2021 ) Patient will demonstrate/report pain </=  2/10 to facilitate minimal limitation in daily activity secondary to pain symptoms. Goal status: New   Patient will demonstrate independent use of advanced home exercise program to facilitate ability to maintain/progress functional gains from skilled physical therapy services. Goal status: New   Patient will demonstrate FOTO outcome > or = (predicted outcome by discharge once FOTO is performed ) % to indicate reduced disability due to condition. Goal status: New   Pt will be able to report pain </= 2/10 in her low back after sitting more than 1 hour.  Goal status: New       5.  Pt will improve her lumbar flexion to >/= 60 degrees with no pain reported.   Goal status: New       PLAN: PT FREQUENCY: 1x/week   PT DURATION: 8 weeks   PLANNED INTERVENTIONS: Therapeutic exercises, Therapeutic activity, Neuromuscular re-education, Balance training, Gait training, Patient/Family education, Joint mobilization, Stair training, Dry Needling, Electrical stimulation, Spinal mobilization, Cryotherapy, Moist heat, Taping, Traction, Ultrasound, and Manual therapy.   PLAN FOR NEXT SESSION: FOTO and edit pt's FOTO goal.  Practical posture, body mechanics as needed.  Review hip hike, consider prone spine strength progressions.  Follow-up on HEP compliance and home walking compliance.     Farley Ly, PT, MPT 08/30/2021, 9:53 AM

## 2021-09-03 ENCOUNTER — Encounter: Payer: BC Managed Care – PPO | Admitting: Rehabilitative and Restorative Service Providers"

## 2021-09-08 ENCOUNTER — Other Ambulatory Visit: Payer: Self-pay | Admitting: Family Medicine

## 2021-09-08 DIAGNOSIS — M5441 Lumbago with sciatica, right side: Secondary | ICD-10-CM

## 2021-09-08 DIAGNOSIS — Z8742 Personal history of other diseases of the female genital tract: Secondary | ICD-10-CM

## 2021-09-08 DIAGNOSIS — M25551 Pain in right hip: Secondary | ICD-10-CM

## 2021-09-10 ENCOUNTER — Encounter: Payer: BC Managed Care – PPO | Admitting: Physical Therapy

## 2021-09-10 ENCOUNTER — Other Ambulatory Visit: Payer: Self-pay | Admitting: Family Medicine

## 2021-09-10 DIAGNOSIS — Z8742 Personal history of other diseases of the female genital tract: Secondary | ICD-10-CM

## 2021-09-19 ENCOUNTER — Ambulatory Visit: Payer: BC Managed Care – PPO | Admitting: Rehabilitative and Restorative Service Providers"

## 2021-09-19 ENCOUNTER — Encounter: Payer: Self-pay | Admitting: Rehabilitative and Restorative Service Providers"

## 2021-09-19 DIAGNOSIS — M545 Low back pain, unspecified: Secondary | ICD-10-CM

## 2021-09-19 DIAGNOSIS — M6281 Muscle weakness (generalized): Secondary | ICD-10-CM | POA: Diagnosis not present

## 2021-09-19 NOTE — Therapy (Signed)
OUTPATIENT PHYSICAL THERAPY TREATMENT NOTE   Patient Name: Kathleen Jordan MRN: [id number] DOB:22-Mar-1971, 51 y.o., female Today's Date: 09/19/2021  PCP: [name], DO REFERRING PROVIDER: Richardean Sale, DO  END OF SESSION:   PT End of Session - 09/19/21 1307     Visit Number 3    Number of Visits 9    Date for PT Re-Evaluation 10/12/21    Authorization Type BCBS    PT Start Time 1306    PT Stop Time 1345    PT Time Calculation (min) 39 min    Activity Tolerance Patient tolerated treatment well    Behavior During Therapy WFL for tasks assessed/performed              Past Medical History:  Diagnosis Date   Allergic rhinitis    Anal fissure 07/15/2017   Chronic constipation    Frequency of urination    GERD (gastroesophageal reflux disease)    History of palpitations    Hx of varicella    Hyperlipidemia    Hypertension    Menorrhagia    OSA (obstructive sleep apnea)    08-07-2017 no cpap due to finanaces   PCOS (polycystic ovarian syndrome)    Peripheral neuropathy    Thyroid goiter    Type 2 diabetes mellitus with hyperglycemia, with long-term current use of insulin (H[facility])    Urgency of urination    Past Surgical History:  Procedure Laterality Date   BREAST REDUCTION SURGERY Bilateral 07-18-2003   dr Shon Hough  [facility]   CHOLECYSTECTOMY N/A 02/15/2019   Procedure: LAPAROSCOPIC CHOLECYSTECTOMY;  Surgeon: Axel Filler, MD;  Location: [facility] OR;  Service: General;  Laterality: N/A;   COLONOSCOPY WITH PROPOFOL N/A 07/15/2017   Procedure: COLONOSCOPY WITH PROPOFOL;  Surgeon: Hilarie Fredrickson, MD;  Location: [facility];  Service: Endoscopy;  Laterality: N/A;   DILITATION & CURRETTAGE/HYSTROSCOPY WITH HYDROTHERMAL ABLATION N/A 03/06/2018   Procedure: DILATATION & CURETTAGE/HYSTEROSCOPY WITH HYDROTHERMAL ABLATION, INSERTION OF INTRAUTERINE DEVICE;  Surgeon: Hal Morales, MD;  Location: [facility];  Service: Gynecology;  Laterality:  N/A;   invitro fertilization  11/16/2010   [facility]   Patient Active Problem List   Diagnosis Date Noted   Multiple joint pain 06/07/2021   Fall 06/03/2021   Uncontrolled type 2 diabetes mellitus with hyperglycemia (HCC) 02/07/2021   Close exposure to COVID-19 virus 09/19/2020   Seborrheic dermatitis of scalp 11/28/2019   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   Dysfunction of eustachian tube 11/22/2019   Epigastric pain 08/16/2019   Other chest pain 08/16/2019   Pain of left calf 08/16/2019   RUQ pain 08/16/2019   Body aches 05/12/2019   History of frequent URI 05/12/2019   Acute non-recurrent pansinusitis 03/12/2019   Perennial allergic rhinitis 01/22/2019   Adverse food reaction 01/22/2019   Shortness of breath 01/22/2019   Allergic conjunctivitis of both eyes 01/22/2019   Hyperlipidemia associated with type 2 diabetes mellitus (HCC) 06/13/2018   Menorrhagia 08/13/2017   Simple endometrial hyperplasia without atypia 08/13/2017   Gastroesophageal reflux disease 07/24/2017   Rectal bleeding 04/02/2017   Anal fissure 04/02/2017   Constipation 04/02/2017   Vaginal itching 01/31/2017   Preventative health care 06/20/2016   IBS (irritable bowel syndrome) 09/07/2015   Knee pain, acute 06/01/2014   Obesity (BMI 30-39.9) 06/15/2013   Postpartum care following vaginal delivery (8/15) 11/13/2012   Frequent sinus infections 07/02/2011   OSA (obstructive sleep apnea) 12/06/2010   IDDM (insulin dependent diabetes mellitus) (HCC)  11/23/2010   HEADACHE 12/08/2009   POLYCYSTIC OVARIES 08/02/2009   MORBID OBESITY 06/29/2009   GOITER, SIMPLE 01/10/2009   DYSPHAGIA UNSPECIFIED 01/10/2009   ABSCESS, SKIN 11/10/2007   Hypertension 05/21/2007   SINUSITIS- ACUTE-NOS 05/21/2007   Essential hypertension 09/18/2006    REFERRING DIAG: S39.011A (ICD-10-CM) - Abdominal muscle strain, initial encounter M54.50 (ICD-10-CM) - Acute right-sided low back pain without   THERAPY DIAG:  Acute  right-sided low back pain without sciatica  Muscle weakness (generalized)  Rationale for Evaluation and Treatment Rehabilitation  PERTINENT HISTORY: DM type II, hypertension, IBS, pain in left calf, SOB, knee pain, obesity, HTN  PRECAUTIONS: Back (avoid flexed postures, slouched postures)  SUBJECTIVE: Shanley reports good home walking compliance.  Exercise compliance is inconsistent at best with poor compliance with supine activities.  She notes less difficulty with prolonged sitting (at her desk and driving) due to taking more frequent breaks to change position.  She is taking nothing during the day and at night a muscle relaxer (PRN, was 2X daily) to help manage pain.  PAIN:  Are you having pain? Yes: NPRS scale: 0-5/10 Pain location: R side abdominal to lower back and R upper gluteal Pain description: Dull Aggravating factors: Prolonged sitting Relieving factors: Movement and avoiding prolonged postures   OBJECTIVE: (objective measures completed at initial evaluation unless otherwise dated)  DIAGNOSTIC FINDINGS:  Negative for pars defect. Symmetric appearance of the SI joints. There is an IUD in the pelvis. Vertebral body heights and disc spaces are maintained. Mild degenerative endplate changes. Normal alignment.   PATIENT SURVEYS:  6/21//2023: FOTO 61 (69 Goal)   SCREENING FOR RED FLAGS: Bowel or bladder incontinence: No Spinal tumors: No Cauda equina syndrome: No Compression fracture: No Abdominal aneurysm: No   COGNITION:            08/13/2021: Overall cognitive status: Within functional limits for tasks assessed                                 SENSATION: [facility] 5/15/ 2023   MUSCLE LENGTH: 08/13/2021: Hamstrings: Right 54 deg; Left 68 deg     POSTURE:  08/13/2021: Forward head and rounded shoulders, decreased lumbar lordosis   PALPATION: 08/13/2021: TTP: Lumbar L2-L5 paraspinals on the right   LUMBAR ROM:    Active  A/PROM  08/13/2021  Flexion 45  Extension  20  Right lateral flexion 32  Left lateral flexion 34  Right rotation Limited 50%    Left rotation Limited 25%    (Blank rows = not tested)       LE MMT:   MMT Right 08/13/2021 Left 08/13/2021  Hip flexion 4+/5 4/5  Hip extension      Hip abduction 5/5 5/5  Hip adduction 5/5 5/5  Hip internal rotation      Hip external rotation      Knee flexion 5/5 5/5  Knee extension 5/5 5/5  Ankle dorsiflexion 5/5 5/5  Ankle plantarflexion 5/5 5/5  Ankle inversion      Ankle eversion       (Blank rows = not tested)   LUMBAR SPECIAL TESTS:  08/13/2021 Slump test: Positive on the Rt 08/13/2021: Straight Leg Raise: negative on Rt   FUNCTIONAL TESTS:  08/13/2021 5 times sit to stand: 15 seconds UE support   GAIT: Distance walked: 30 feet Assistive device utilized: None Level of assistance: Complete Independence Comments: step through gait pattern  TODAY'S TREATMENT  09/19/2021 Therapeutic Exercises Trunk extension AROM 15X 3 seconds (needed feedback and correction) Shoulder blade pinches 10X 5 seconds Hip hike in door frame 3 sets of 5 for 3 seconds (needed correction) Bridging 10X 5 seconds (reports compliance will be poor with this) Prone alternating hip extensions 12X 3 seconds  Functional Activities: Anatomy education regarding quadratus lumborum and hip abductors, log roll and walking education   08/30/2021 Pelvic/trunk rotation 10X 10 seconds Bridging 10X 5 seconds Figure 4 stretch 3X 20 seconds Piriformis stretch with 1 foot on ground 3X 20 seconds Hamstrings stretch supine with other leg straight 3X 20 seconds  Trunk extension AROM 10X 3 seconds Shoulder blade pinches 10X 5 seconds Hip hike 2 sets of 5 for 3 seconds  Functional Activities: Spine anatomy, education on changing position frequently, lumbar roll use and a home walking program.   08/13/2021 HEP instruction/performance c cues for techniques, handout provided.  Trial set performed of each for  comprehension and symptom assessment.  See below for exercise list.     PATIENT EDUCATION:  Education details: PT POC, HEP, Discussed DN at future visits Person educated: Patient Education method: Explanation, Demonstration, Tactile cues, Verbal cues, and Handouts Education comprehension: verbalized understanding and returned demonstration     HOME EXERCISE PROGRAM: Access Code: [email or web address] URL: [email or web address] Date: 09/19/2021 Prepared by: [name]  Exercises - Seated Hamstring Stretch  - 2 x daily - 7 x weekly - 3-5 reps - 30 seconds hold - Hooklying Hamstring Stretch with Strap  - 2 x daily - 7 x weekly - 3-5 reps - 30 seconds hold - Supine Lower Trunk Rotation  - 2 x daily - 7 x weekly - 3 reps - 30 seconds hold - Supine Bridge  - 2 x daily - 7 x weekly - 2 sets - 10 reps - 5 seconds hold - Supine Piriformis Stretch with Foot on Ground  - 2 x daily - 7 x weekly - 3 reps - 20-30 seconds hold - Supine Figure 4 Piriformis Stretch  - 2 x daily - 7 x weekly - 3 reps - 20-30 seconds hold - Standing Lumbar Extension at Wall - Forearms  - 5 x daily - 7 x weekly - 1 sets - 5 reps - 3 seconds hold - Standing Scapular Retraction  - 5 x daily - 7 x weekly - 1 sets - 5 reps - 5 second hold - Standing Hip Hiking  - 5 x daily - 7 x weekly - 1 sets - 5 reps - 3 seconds hold - Prone Hip Extension  - 2 x daily - 7 x weekly - 1 sets - 10 reps - 3 seconds hold   ASSESSMENT:   CLINICAL IMPRESSION: Madysyn repots she is doing a better job with her postural awareness and she is getting up more frequently when sitting.  She has also been walking at least 3X/week as recommended.  Her HEP compliance has been poor with supine activities due to her work schedule (and fatigue) so I recommended she at least get in her new prone strength activity 20X per day (all in one session morning or evening) along with her other standing  exercises throughout the day.  Her prognosis remains good with  the recommended POC.     OBJECTIVE IMPAIRMENTS decreased mobility, difficulty walking, decreased ROM, decreased strength, impaired flexibility, obesity, and pain.    ACTIVITY LIMITATIONS community activity, driving, and occupation.    PERSONAL FACTORS DM type II, hypertension, IBS, pain  in left calf, SOB, knee pain, obesity, HTN are also affecting patient's functional outcome.      REHAB POTENTIAL: Good   CLINICAL DECISION MAKING: Stable/uncomplicated   EVALUATION COMPLEXITY: Low     GOALS: Goals reviewed with patient? Yes Short term PT Goals (target date for Short term goals are 4 weeks 09/14/2021) Patient will demonstrate independent use of initial home exercise program to maintain progress from in clinic treatments. Goal status: Inconsistent compliance 09/19/2021      2. Pt will be able to demonstrate correct body mechanics when lifting 10# item from floor to counter height demonstrating correct body mechanics.                   A.  Goal Status: On Going 09/19/2021     Long term PT goals (target dates for all long term goals are 8 weeks  10/12/2021 ) Patient will demonstrate/report pain </=  2/10 to facilitate minimal limitation in daily activity secondary to pain symptoms. Goal status: On Going (improving) 09/19/2021   Patient will demonstrate independent use of advanced home exercise program to facilitate ability to maintain/progress functional gains from skilled physical therapy services. Goal status: On Going 09/19/2021   Patient will demonstrate FOTO outcome > or = (69) % to indicate reduced disability due to condition. Goal status: New   Pt will be able to report pain </= 2/10 in her low back after sitting more than 1 hour.  Goal status: On Going 09/19/2021       5.  Pt will improve her lumbar flexion to >/= 60 degrees with no pain reported.   Goal status: On Going 09/19/2021       PLAN: PT FREQUENCY: 1x/week   PT DURATION: 8 weeks   PLANNED INTERVENTIONS:  Therapeutic exercises, Therapeutic activity, Neuromuscular re-education, Balance training, Gait training, Patient/Family education, Joint mobilization, Stair training, Dry Needling, Electrical stimulation, Spinal mobilization, Cryotherapy, Moist heat, Taping, Traction, Ultrasound, and Manual therapy.   PLAN FOR NEXT SESSION: Follow-up on HEP compliance and home walking compliance.  Modify as needed.     Cherlyn Cushing, PT, MPT 09/19/2021, 5:02 PM

## 2021-09-20 NOTE — Progress Notes (Unsigned)
Kathleen Jordan D.[facility] [address] [place] [address] Phone: [phone]   Assessment and Plan:     There are no diagnoses linked to this encounter.  ***   Pertinent previous records reviewed include ***   Follow Up: ***     Subjective:   I, Kathleen Jordan, am serving as a Neurosurgeon for Doctor Richardean Sale   Chief Complaint: right side low back pain    HPI:    07/03/2021 Patient is a 51 year old female complaining of right sided low back pain. Patient states that is a non stop pain, that [place], no numbness or tingling, pain radiates all the way across and all the way to the front, has been taking meloxicam and  flexeril at night and those don't seem to be helping, finds relief when she stands, sitting is very uncomfortable , has been sign heat and that doesn't seem to be helping at all    07/17/2021 Patient states this morning is the first morning that the pain is intermittent and not constant and intense    08/17/2021 Patient states shes is doing okay , still cant sit for a long period of time just started PT has to get used to it   09/21/2021 Patient states      Relevant Historical Information: DM type II, hypertension, IBS    Additional pertinent review of systems negative.   Current Outpatient Medications:    cetirizine (ZYRTEC ALLERGY) 10 MG tablet, Take 1 tablet (10 mg total) by mouth daily., Disp: 30 tablet, Rfl: 11   Ciclopirox 1 % shampoo, Massage into scalp , leave 5 min and rinse, Disp: 120 mL, Rfl: 0   clarithromycin (BIAXIN XL) 500 MG 24 hr tablet, Take 2 tablets (1,000 mg total) by mouth daily., Disp: 20 tablet, Rfl: 0   cyclobenzaprine (FLEXERIL) 10 MG tablet, Take 1 tablet (10 mg total) by mouth 3 (three) times daily as needed for muscle spasms., Disp: 30 tablet, Rfl: 1   dicyclomine (BENTYL) 20 MG tablet, Take 20 mg by mouth every 6 (six) hours., Disp: , Rfl:     empagliflozin (JARDIANCE) 25 MG TABS tablet, Take 25 mg by mouth every evening. , Disp: , Rfl:    EPINEPHrine (AUVI-Q) 0.3 mg/0.3 mL IJ SOAJ injection, Inject 0.3 mLs (0.3 mg total) into the muscle as needed for anaphylaxis., Disp: 1 each, Rfl: 1   ezetimibe (ZETIA) 10 MG tablet, TAKE 1 TABLET(10 MG) BY MOUTH DAILY, Disp: 90 tablet, Rfl: 1   glimepiride (AMARYL) 1 MG tablet, Take by mouth., Disp: , Rfl:    glucose blood (CONTOUR NEXT TEST) test strip, Check BG 3 times/day.  Dx E11.65, Disp: , Rfl:    meloxicam (MOBIC) 15 MG tablet, TAKE 1/2 TO 1 TABLET BY MOUTH EVERY DAY AS NEEDED, Disp: 30 tablet, Rfl: 0   methocarbamol (ROBAXIN) 500 MG tablet, Take 1 tablet (500 mg total) by mouth 3 (three) times daily., Disp: 60 tablet, Rfl: 0   NIFEdipine (PROCARDIA XL/NIFEDICAL XL) 60 MG 24 hr tablet, TAKE 1 TABLET(60 MG) BY MOUTH DAILY, Disp: 90 tablet, Rfl: 1   rosuvastatin (CRESTOR) 10 MG tablet, TAKE 1 TABLET(10 MG) BY MOUTH AT BEDTIME, Disp: 30 tablet, Rfl: 2   spironolactone (ALDACTONE) 25 MG tablet, TAKE 1 TABLET(25 MG) BY MOUTH DAILY, Disp: 30 tablet, Rfl: 0   tiZANidine (ZANAFLEX) 4 MG tablet, Take 1 tablet (4 mg total) by mouth every  8 (eight) hours as needed for muscle spasms., Disp: 40 tablet, Rfl: 0   TRESIBA FLEXTOUCH 200 UNIT/ML FlexTouch Pen, Inject into the skin., Disp: , Rfl:    TRULICITY 3 MG/0.5ML SOPN, Inject into the skin., Disp: , Rfl:    Objective:     There were no vitals filed for this visit.    There is no height or weight on file to calculate BMI.    Physical Exam:    ***   Electronically signed by:  Kathleen Jordan D.[facility] 2:32 PM 09/20/21

## 2021-09-21 ENCOUNTER — Ambulatory Visit: Payer: BC Managed Care – PPO | Admitting: Sports Medicine

## 2021-09-21 VITALS — BP 110/70 | HR 84 | Resp 96 | Ht 68.0 in | Wt 235.0 lb

## 2021-09-21 DIAGNOSIS — S39011A Strain of muscle, fascia and tendon of abdomen, initial encounter: Secondary | ICD-10-CM

## 2021-09-21 DIAGNOSIS — M545 Low back pain, unspecified: Secondary | ICD-10-CM

## 2021-09-21 DIAGNOSIS — G8929 Other chronic pain: Secondary | ICD-10-CM | POA: Diagnosis not present

## 2021-09-25 NOTE — Therapy (Signed)
OUTPATIENT PHYSICAL THERAPY TREATMENT NOTE   Patient Name: Kathleen Jordan MRN: [id number] DOB:08/14/1970, 51 y.o., female Today's Date: 09/26/2021  PCP: Ann Held, DO REFERRING PROVIDER: Glennon Mac, DO  END OF SESSION:   PT End of Session - 09/26/21 0856     Visit Number 4    Number of Visits 9    Date for PT Re-Evaluation 10/12/21    Authorization Type BCBS    PT Start Time 0808    PT Stop Time 0846    PT Time Calculation (min) 38 min    Activity Tolerance Patient tolerated treatment well    Behavior During Therapy [facility] for tasks assessed/performed               Past Medical History:  Diagnosis Date   Allergic rhinitis    Anal fissure 07/15/2017   Chronic constipation    Frequency of urination    GERD (gastroesophageal reflux disease)    History of palpitations    Hx of varicella    Hyperlipidemia    Hypertension    Menorrhagia    OSA (obstructive sleep apnea)    08-07-2017 no cpap due to finanaces   PCOS (polycystic ovarian syndrome)    Peripheral neuropathy    Thyroid goiter    Type 2 diabetes mellitus with hyperglycemia, with long-term current use of insulin ([place])    Urgency of urination    Past Surgical History:  Procedure Laterality Date   BREAST REDUCTION SURGERY Bilateral 07-18-2003   dr Towanda Malkin  [facility]   CHOLECYSTECTOMY N/A 02/15/2019   Procedure: LAPAROSCOPIC CHOLECYSTECTOMY;  Surgeon: Ralene Ok, MD;  Location: [facility];  Service: General;  Laterality: N/A;   COLONOSCOPY WITH PROPOFOL N/A 07/15/2017   Procedure: COLONOSCOPY WITH PROPOFOL;  Surgeon: Irene Shipper, MD;  Location: [facility];  Service: Endoscopy;  Laterality: N/A;   DILITATION & CURRETTAGE/HYSTROSCOPY WITH HYDROTHERMAL ABLATION N/A 03/06/2018   Procedure: DILATATION & CURETTAGE/HYSTEROSCOPY WITH HYDROTHERMAL ABLATION, INSERTION OF INTRAUTERINE DEVICE;  Surgeon: Eldred Manges, MD;  Location: [facility];  Service: Gynecology;   Laterality: N/A;   invitro fertilization  11/16/2010   [facility]   Patient Active Problem List   Diagnosis Date Noted   Multiple joint pain 06/07/2021   Fall 06/03/2021   Uncontrolled type 2 diabetes mellitus with hyperglycemia ([place]) 02/07/2021   Close exposure to COVID-19 virus 09/19/2020   Seborrheic dermatitis of scalp 11/28/2019   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   Dysfunction of eustachian tube 11/22/2019   Epigastric pain 08/16/2019   Other chest pain 08/16/2019   Pain of left calf 08/16/2019   RUQ pain 08/16/2019   Body aches 05/12/2019   History of frequent URI 05/12/2019   Acute non-recurrent pansinusitis 03/12/2019   Perennial allergic rhinitis 01/22/2019   Adverse food reaction 01/22/2019   Shortness of breath 01/22/2019   Allergic conjunctivitis of both eyes 01/22/2019   Hyperlipidemia associated with type 2 diabetes mellitus ([place]) 06/13/2018   Menorrhagia 08/13/2017   Simple endometrial hyperplasia without atypia 08/13/2017   Gastroesophageal reflux disease 07/24/2017   Rectal bleeding 04/02/2017   Anal fissure 04/02/2017   Constipation 04/02/2017   Vaginal itching 01/31/2017   Preventative health care 06/20/2016   IBS (irritable bowel syndrome) 09/07/2015   Knee pain, acute 06/01/2014   Obesity (BMI 30-39.9) 06/15/2013   Postpartum care following vaginal delivery (8/15) 11/13/2012   Frequent sinus infections 07/02/2011   OSA (obstructive sleep apnea) 12/06/2010   IDDM (insulin dependent diabetes mellitus) (  [place]) 11/23/2010   HEADACHE 12/08/2009   POLYCYSTIC OVARIES 08/02/2009   MORBID OBESITY 06/29/2009   GOITER, SIMPLE 01/10/2009   DYSPHAGIA UNSPECIFIED 01/10/2009   ABSCESS, SKIN 11/10/2007   Hypertension 05/21/2007   SINUSITIS- ACUTE-NOS 05/21/2007   Essential hypertension 09/18/2006    REFERRING DIAG: S39.011A (ICD-10-CM) - Abdominal muscle strain, initial encounter M54.50 (ICD-10-CM) - Acute right-sided low back pain without   THERAPY DIAG:   Acute right-sided low back pain without sciatica  Muscle weakness (generalized)  Rationale for Evaluation and Treatment Rehabilitation  PERTINENT HISTORY: DM type II, hypertension, IBS, pain in left calf, SOB, knee pain, obesity, HTN  PRECAUTIONS: Back (avoid flexed postures, slouched postures)  SUBJECTIVE: Pt reporting pain of 1/10 upon arrival. Pt reporting overall improvements since beginning therapy.   PAIN:  Are you having pain? Yes: NPRS scale: 1/10 Pain location: R side abdominal to lower back and R upper gluteal Pain description: Dull Aggravating factors: Prolonged sitting Relieving factors: Movement and avoiding prolonged postures   OBJECTIVE: (objective measures completed at initial evaluation unless otherwise dated)  DIAGNOSTIC FINDINGS:  Negative for pars defect. Symmetric appearance of the SI joints. There is an IUD in the pelvis. Vertebral body heights and disc spaces are maintained. Mild degenerative endplate changes. Normal alignment.   PATIENT SURVEYS:  6/21//2023: FOTO 61 (69 Goal)   SCREENING FOR RED FLAGS: Bowel or bladder incontinence: No Spinal tumors: No Cauda equina syndrome: No Compression fracture: No Abdominal aneurysm: No   COGNITION:            08/13/2021: Overall cognitive status: Within functional limits for tasks assessed                                 SENSATION: [facility] 5/15/ 2023   MUSCLE LENGTH: 08/13/2021: Hamstrings: Right 54 deg; Left 68 deg     POSTURE:  08/13/2021: Forward head and rounded shoulders, decreased lumbar lordosis   PALPATION: 08/13/2021: TTP: Lumbar L2-L5 paraspinals on the right   LUMBAR ROM:    Active  A/PROM  08/13/2021 Active 09/26/21  Flexion 45 60  Extension 20 32  Right lateral flexion 32 40  Left lateral flexion 34 42  Right rotation Limited 50%   WFL   Left rotation Limited 25%  WFL   (Blank rows = not tested)       LE MMT:   MMT Right 08/13/2021 Left 08/13/2021 Rt 09/26/21  Hip flexion  4+/5 4/5 5/5  Hip extension       Hip abduction 5/5 5/5   Hip adduction 5/5 5/5   Hip internal rotation       Hip external rotation       Knee flexion 5/5 5/5   Knee extension 5/5 5/5   Ankle dorsiflexion 5/5 5/5   Ankle plantarflexion 5/5 5/5   Ankle inversion       Ankle eversion        (Blank rows = not tested)   LUMBAR SPECIAL TESTS:  08/13/2021 Slump test: Positive on the Rt 08/13/2021: Straight Leg Raise: negative on Rt  09/26/21: Slump test Rt: mild pain noted in low back but no radiation down Rt LE   FUNCTIONAL TESTS:  08/13/2021 5 times sit to stand: 15 seconds UE support 08/26/21: 5 time sit to stand: 12 seconds no UE support   GAIT: Distance walked: 30 feet Assistive device utilized: None Level of assistance: Complete Independence Comments: step through gait pattern  TODAY'S TREATMENT  09/26/2021 Therapeutic Exercises: Nustep Level 6 x 5 minutes Leg Press: 75# 3 x 10 bilateral LE's  BATCA: rows 20# 2 x 15  Gastroc/Soleus stretch each x 2 holding 20 seconds on slant board Trunk extension elbows on wall x 10 holding 10 seconds Bridging 10X 5 seconds c lower legs on red physioball Prone alternating hip extensions 12X 3 seconds Prone press ups: holding 15 sec x 5  Quadraped: cat/cow x 10 holding 10 seconds Quadraped: opposite arm/leg x 10 holding  Lifting 10# from floor to counter height x 5 instructions in proper body mechanics    09/19/2021 Therapeutic Exercises Trunk extension AROM 15X 3 seconds (needed feedback and correction) Shoulder blade pinches 10X 5 seconds Hip hike in door frame 3 sets of 5 for 3 seconds (needed correction) Bridging 10X 5 seconds (reports compliance will be poor with this) Prone alternating hip extensions 12X 3 seconds  Functional Activities: Anatomy education regarding quadratus lumborum and hip abductors, log roll and walking education   08/30/2021 Pelvic/trunk rotation 10X 10 seconds Bridging 10X 5 seconds Figure 4  stretch 3X 20 seconds Piriformis stretch with 1 foot on ground 3X 20 seconds Hamstrings stretch supine with other leg straight 3X 20 seconds  Trunk extension AROM 10X 3 seconds Shoulder blade pinches 10X 5 seconds Hip hike 2 sets of 5 for 3 seconds  Functional Activities: Spine anatomy, education on changing position frequently, lumbar roll use and a home walking program.   08/13/2021 HEP instruction/performance c cues for techniques, handout provided.  Trial set performed of each for comprehension and symptom assessment.  See below for exercise list.     PATIENT EDUCATION:  Education details: PT POC, HEP, Discussed DN at future visits Person educated: Patient Education method: Explanation, Demonstration, Tactile cues, Verbal cues, and Handouts Education comprehension: verbalized understanding and returned demonstration     HOME EXERCISE PROGRAM: Access Code: [email or web address] URL: [email or web address] Date: 09/19/2021 Prepared by: Vista Mink  Exercises - Seated Hamstring Stretch  - 2 x daily - 7 x weekly - 3-5 reps - 30 seconds hold - Hooklying Hamstring Stretch with Strap  - 2 x daily - 7 x weekly - 3-5 reps - 30 seconds hold - Supine Lower Trunk Rotation  - 2 x daily - 7 x weekly - 3 reps - 30 seconds hold - Supine Bridge  - 2 x daily - 7 x weekly - 2 sets - 10 reps - 5 seconds hold - Supine Piriformis Stretch with Foot on Ground  - 2 x daily - 7 x weekly - 3 reps - 20-30 seconds hold - Supine Figure 4 Piriformis Stretch  - 2 x daily - 7 x weekly - 3 reps - 20-30 seconds hold - Standing Lumbar Extension at Wall - Forearms  - 5 x daily - 7 x weekly - 1 sets - 5 reps - 3 seconds hold - Standing Scapular Retraction  - 5 x daily - 7 x weekly - 1 sets - 5 reps - 5 second hold - Standing Hip Hiking  - 5 x daily - 7 x weekly - 1 sets - 5 reps - 3 seconds hold - Prone Hip Extension  - 2 x daily - 7 x weekly - 1 sets - 10 reps - 3 seconds hold   ASSESSMENT:   CLINICAL  IMPRESSION: Pt arriving this morning reporting 1/10 pain. Pt reporting compliance in her HEP. Pt stating less pain radiation down her Rt LE. Pt's lumbar mobility has improved since  initial evaluation along with Rt hip flexion strength to 5/5. Expecting discharge in the next few visits pending pt's report of pain with ADL's and functional activities.     OBJECTIVE IMPAIRMENTS decreased mobility, difficulty walking, decreased ROM, decreased strength, impaired flexibility, obesity, and pain.    ACTIVITY LIMITATIONS community activity, driving, and occupation.    PERSONAL FACTORS DM type II, hypertension, IBS, pain in left calf, SOB, knee pain, obesity, HTN are also affecting patient's functional outcome.      REHAB POTENTIAL: Good   CLINICAL DECISION MAKING: Stable/uncomplicated   EVALUATION COMPLEXITY: Low     GOALS: Goals reviewed with patient? Yes Short term PT Goals (target date for Short term goals are 4 weeks 09/14/2021) Patient will demonstrate independent use of initial home exercise program to maintain progress from in clinic treatments. Goal status:09/26/21 MET      2. Pt will be able to demonstrate correct body mechanics when lifting 10# item from floor to counter height demonstrating correct body mechanics.                   A.  Goal Status: minimal verbal cues 09/26/21     Long term PT goals (target dates for all long term goals are 8 weeks  10/12/2021 ) Patient will demonstrate/report pain </=  2/10 to facilitate minimal limitation in daily activity secondary to pain symptoms. Goal status: On Going (improving) 09/19/2021   Patient will demonstrate independent use of advanced home exercise program to facilitate ability to maintain/progress functional gains from skilled physical therapy services. Goal status: On Going 09/19/2021   Patient will demonstrate FOTO outcome > or = (69) % to indicate reduced disability due to condition. Goal status: New   Pt will be able to report  pain </= 2/10 in her low back after sitting more than 1 hour.  Goal status: On Going 09/19/2021       5.  Pt will improve her lumbar flexion to >/= 60 degrees with no pain reported.   Goal status: MET 09/26/21       PLAN: PT FREQUENCY: 1x/week   PT DURATION: 8 weeks   PLANNED INTERVENTIONS: Therapeutic exercises, Therapeutic activity, Neuromuscular re-education, Balance training, Gait training, Patient/Family education, Joint mobilization, Stair training, Dry Needling, Electrical stimulation, Spinal mobilization, Cryotherapy, Moist heat, Taping, Traction, Ultrasound, and Manual therapy.   PLAN FOR NEXT SESSION: Follow-up on HEP compliance and home walking compliance.  Modify as needed.     Oretha Caprice, PT, MPT 09/26/2021, 8:57 AM

## 2021-09-26 ENCOUNTER — Encounter: Payer: Self-pay | Admitting: Physical Therapy

## 2021-09-26 ENCOUNTER — Ambulatory Visit: Payer: BC Managed Care – PPO | Admitting: Physical Therapy

## 2021-09-26 DIAGNOSIS — M545 Low back pain, unspecified: Secondary | ICD-10-CM

## 2021-09-26 DIAGNOSIS — M6281 Muscle weakness (generalized): Secondary | ICD-10-CM | POA: Diagnosis not present

## 2021-10-03 ENCOUNTER — Encounter: Payer: BC Managed Care – PPO | Admitting: Physical Therapy

## 2021-10-08 ENCOUNTER — Ambulatory Visit: Payer: BC Managed Care – PPO | Admitting: Physical Therapy

## 2021-10-08 ENCOUNTER — Encounter: Payer: Self-pay | Admitting: Physical Therapy

## 2021-10-08 DIAGNOSIS — M545 Low back pain, unspecified: Secondary | ICD-10-CM

## 2021-10-08 DIAGNOSIS — M6281 Muscle weakness (generalized): Secondary | ICD-10-CM | POA: Diagnosis not present

## 2021-10-08 NOTE — Therapy (Signed)
OUTPATIENT PHYSICAL THERAPY TREATMENT NOTE Discharge    Patient Name: Kathleen Jordan MRN: [id number] DOB:[birth year], 51 y.o., female Today's Date: 10/08/2021  PCP: Ann Held, DO REFERRING PROVIDER: Glennon Mac, DO  END OF SESSION:   PT End of Session - 10/08/21 0816     Visit Number 5    Number of Visits 9    Date for PT Re-Evaluation 10/12/21    Authorization Type BC[employer]    PT Start Time 0810    PT Stop Time 0845    PT Time Calculation (min) 35 min    Activity Tolerance Patient tolerated treatment well    Behavior During Therapy [facility] for tasks assessed/performed                Past Medical History:  Diagnosis Date   Allergic rhinitis    Anal fissure 07/15/2017   Chronic constipation    Frequency of urination    GERD (gastroesophageal reflux disease)    History of palpitations    Hx of varicella    Hyperlipidemia    Hypertension    Menorrhagia    OSA (obstructive sleep apnea)    08-07-2017 no cpap due to finanaces   PCOS (polycystic ovarian syndrome)    Peripheral neuropathy    Thyroid goiter    Type 2 diabetes mellitus with hyperglycemia, with long-term current use of insulin ([place])    Urgency of urination    Past Surgical History:  Procedure Laterality Date   BREAST REDUCTION SURGERY Bilateral 07-18-2003   dr Towanda Malkin  [facility]   CHOLECYSTECTOMY N/A 02/15/2019   Procedure: LAPAROSCOPIC CHOLECYSTECTOMY;  Surgeon: Ralene Ok, MD;  Location: [place];  Service: General;  Laterality: N/A;   COLONOSCOPY WITH PROPOFOL N/A 07/15/2017   Procedure: COLONOSCOPY WITH PROPOFOL;  Surgeon: Irene Shipper, MD;  Location: [facility];  Service: Endoscopy;  Laterality: N/A;   DILITATION & CURRETTAGE/HYSTROSCOPY WITH HYDROTHERMAL ABLATION N/A 03/06/2018   Procedure: DILATATION & CURETTAGE/HYSTEROSCOPY WITH HYDROTHERMAL ABLATION, INSERTION OF INTRAUTERINE DEVICE;  Surgeon: Eldred Manges, MD;  Location: [place];  Service:  Gynecology;  Laterality: N/A;   invitro fertilization  11/16/2010   [facility]   Patient Active Problem List   Diagnosis Date Noted   Multiple joint pain 06/07/2021   Fall 06/03/2021   Uncontrolled type 2 diabetes mellitus with hyperglycemia ([place]) 02/07/2021   Close exposure to COVID-19 virus 09/19/2020   Seborrheic dermatitis of scalp 11/28/2019   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   Dysfunction of eustachian tube 11/22/2019   Epigastric pain 08/16/2019   Other chest pain 08/16/2019   Pain of left calf 08/16/2019   RUQ pain 08/16/2019   Body aches 05/12/2019   History of frequent URI 05/12/2019   Acute non-recurrent pansinusitis 03/12/2019   Perennial allergic rhinitis 01/22/2019   Adverse food reaction 01/22/2019   Shortness of breath 01/22/2019   Allergic conjunctivitis of both eyes 01/22/2019   Hyperlipidemia associated with type 2 diabetes mellitus ([place]) 06/13/2018   Menorrhagia 08/13/2017   Simple endometrial hyperplasia without atypia 08/13/2017   Gastroesophageal reflux disease 07/24/2017   Rectal bleeding 04/02/2017   Anal fissure 04/02/2017   Constipation 04/02/2017   Vaginal itching 01/31/2017   Preventative health care 06/20/2016   IBS (irritable bowel syndrome) 09/07/2015   Knee pain, acute 06/01/2014   Obesity (BMI 30-39.9) 06/15/2013   Postpartum care following vaginal delivery (8/15) 11/13/2012   Frequent sinus infections 07/02/2011   OSA (obstructive sleep apnea) 12/06/2010   IDDM (insulin  dependent diabetes mellitus) ([place]) 11/23/2010   HEADACHE 12/08/2009   POLYCYSTIC OVARIES 08/02/2009   MORBID OBESITY 06/29/2009   GOITER, SIMPLE 01/10/2009   DYSPHAGIA UNSPECIFIED 01/10/2009   ABSCESS, SKIN 11/10/2007   Hypertension 05/21/2007   SINUSITIS- ACUTE-NOS 05/21/2007   Essential hypertension 09/18/2006    REFERRING DIAG: S39.011A (ICD-10-CM) - Abdominal muscle strain, initial encounter M54.50 (ICD-10-CM) - Acute right-sided low back pain without    THERAPY DIAG:  Acute right-sided low back pain without sciatica  Muscle weakness (generalized)  Rationale for Evaluation and Treatment Rehabilitation  PERTINENT HISTORY: DM type II, hypertension, IBS, pain in left calf, SOB, knee pain, obesity, HTN  PRECAUTIONS: Back (avoid flexed postures, slouched postures)  SUBJECTIVE: Pt reporting no pain upon arrival.   PAIN:  Are you having pain? no   OBJECTIVE: (objective measures completed at initial evaluation unless otherwise dated)  DIAGNOSTIC FINDINGS:  Negative for pars defect. Symmetric appearance of the SI joints. There is an IUD in the pelvis. Vertebral body heights and disc spaces are maintained. Mild degenerative endplate changes. Normal alignment.   PATIENT SURVEYS:  6/21//2023: FOTO 61 (69 Goal) 10/08/21; FOTO 78%    SCREENING FOR RED FLAGS: Bowel or bladder incontinence: No Spinal tumors: No Cauda equina syndrome: No Compression fracture: No Abdominal aneurysm: No   COGNITION:            08/13/2021: Overall cognitive status: Within functional limits for tasks assessed                                 SENSATION: [facility] 5/15/ 2023   MUSCLE LENGTH: 08/13/2021: Hamstrings: Right 54 deg; Left 68 deg     POSTURE:  08/13/2021: Forward head and rounded shoulders, decreased lumbar lordosis   PALPATION: 08/13/2021: TTP: Lumbar L2-L5 paraspinals on the right   LUMBAR ROM:    Active  A/PROM  08/13/2021 Active 09/26/21  Flexion 45 60  Extension 20 32  Right lateral flexion 32 40  Left lateral flexion 34 42  Right rotation Limited 50%   WFL   Left rotation Limited 25%  WFL   (Blank rows = not tested)       LE MMT:   MMT Right 08/13/2021 Left 08/13/2021 Rt 09/26/21  Hip flexion 4+/5 4/5 5/5  Hip extension       Hip abduction 5/5 5/5   Hip adduction 5/5 5/5   Hip internal rotation       Hip external rotation       Knee flexion 5/5 5/5   Knee extension 5/5 5/5   Ankle dorsiflexion 5/5 5/5   Ankle  plantarflexion 5/5 5/5   Ankle inversion       Ankle eversion        (Blank rows = not tested)   LUMBAR SPECIAL TESTS:  08/13/2021 Slump test: Positive on the Rt 08/13/2021: Straight Leg Raise: negative on Rt  09/26/21: Slump test Rt: mild pain noted in low back but no radiation down Rt LE   FUNCTIONAL TESTS:  08/13/2021 5 times sit to stand: 15 seconds UE support 08/26/21: 5 time sit to stand: 12 seconds no UE support   GAIT: Distance walked: 30 feet Assistive device utilized: None Level of assistance: Complete Independence Comments: step through gait pattern        TODAY'S TREATMENT  10/08/2021 Therapeutic Exercises: Nustep Level 6 x 5 minutes Leg Press: 100# 3 x 10 bilateral LE's  BATCA: rows 25#  2 x 15  Trunk extension elbows on wall x 10 holding 10 seconds Bridging 10X 5 seconds c lower legs on red physioball Prone alternating hip extensions 12X 3 seconds Prone press ups: holding 15 sec x 5  Quadraped: cat/cow x 10 holding 10 seconds Quadraped: opposite arm/leg x 10 holding  Dead lift carry 15# x 50 feet in Rt UE and then in left UE Lifting 10# from counter height to 1st clinic shelf x 5 and 2nd shelf x 5 Able to lift from floor to counter 10# weight   09/26/2021 Therapeutic Exercises: Nustep Level 6 x 5 minutes Leg Press: 75# 3 x 10 bilateral LE's  BATCA: rows 20# 2 x 15  Gastroc/Soleus stretch each x 2 holding 20 seconds on slant board Trunk extension elbows on wall x 10 holding 10 seconds Bridging 10X 5 seconds c lower legs on red physioball Prone alternating hip extensions 12X 3 seconds Prone press ups: holding 15 sec x 5  Quadraped: cat/cow x 10 holding 10 seconds Quadraped: opposite arm/leg x 10 holding  Lifting 10# from floor to counter height x 5 instructions in proper body mechanics    09/19/2021 Therapeutic Exercises Trunk extension AROM 15X 3 seconds (needed feedback and correction) Shoulder blade pinches 10X 5 seconds Hip hike in door frame 3 sets  of 5 for 3 seconds (needed correction) Bridging 10X 5 seconds (reports compliance will be poor with this) Prone alternating hip extensions 12X 3 seconds  Functional Activities: Anatomy education regarding quadratus lumborum and hip abductors, log roll and walking education        PATIENT EDUCATION:  Education details: PT POC, HEP, Discussed DN at future visits Person educated: Patient Education method: Explanation, Demonstration, Tactile cues, Verbal cues, and Handouts Education comprehension: verbalized understanding and returned demonstration     HOME EXERCISE PROGRAM: Access Code: [email or web address] URL: [email or web address] Date: 10/08/2021 Prepared by: Kearney Hard  Exercises - Seated Hamstring Stretch  - 2 x daily - 7 x weekly - 3-5 reps - 30 seconds hold - Hooklying Hamstring Stretch with Strap  - 2 x daily - 7 x weekly - 3-5 reps - 30 seconds hold - Supine Lower Trunk Rotation  - 2 x daily - 7 x weekly - 3 reps - 30 seconds hold - Supine Bridge  - 2 x daily - 7 x weekly - 2 sets - 10 reps - 5 seconds hold - Supine Piriformis Stretch with Foot on Ground  - 2 x daily - 7 x weekly - 3 reps - 20-30 seconds hold - Supine Figure 4 Piriformis Stretch  - 2 x daily - 7 x weekly - 3 reps - 20-30 seconds hold - Standing Lumbar Extension at [place]  - 5 x daily - 7 x weekly - 1 sets - 5 reps - 3 seconds hold - Standing Scapular Retraction  - 5 x daily - 7 x weekly - 1 sets - 5 reps - 5 second hold - Standing Hip Hiking  - 5 x daily - 7 x weekly - 1 sets - 5 reps - 3 seconds hold - Prone Hip Extension  - 2 x daily - 7 x weekly - 1 sets - 10 reps - 3 seconds hold - Prone Alternating Arm and Leg Lifts  - 1-2 x daily - 7 x weekly - 10 reps - 5 seconds hold - Standing Shoulder Row with Anchored Resistance  - 1-2 x daily - 7 x weekly - 2 sets - 20 reps -  3 seconds hold   ASSESSMENT:   CLINICAL IMPRESSION: Pt has currently met all her STG's and LTG's set. Pt has improved  her lumbar mobility and LE hip strength. Pt is being discharged from skilled PT at present time.      OBJECTIVE IMPAIRMENTS decreased mobility, difficulty walking, decreased ROM, decreased strength, impaired flexibility, obesity, and pain.    ACTIVITY LIMITATIONS community activity, driving, and occupation.    PERSONAL FACTORS DM type II, hypertension, IBS, pain in left calf, SOB, knee pain, obesity, HTN are also affecting patient's functional outcome.      REHAB POTENTIAL: Good   CLINICAL DECISION MAKING: Stable/uncomplicated   EVALUATION COMPLEXITY: Low     GOALS: Goals reviewed with patient? Yes Short term PT Goals (target date for Short term goals are 4 weeks 09/14/2021) Patient will demonstrate independent use of initial home exercise program to maintain progress from in clinic treatments. Goal status:09/26/21 MET      2. Pt will be able to demonstrate correct body mechanics when lifting 10# item from floor to counter height demonstrating correct body mechanics.                   A.  Goal Status: MET 10/08/21     Long term PT goals (target dates for all long term goals are 8 weeks  10/12/2021 ) Patient will demonstrate/report pain </=  2/10 to facilitate minimal limitation in daily activity secondary to pain symptoms. Goal status: MET 10/08/21   Patient will demonstrate independent use of advanced home exercise program to facilitate ability to maintain/progress functional gains from skilled physical therapy services. Goal status:  MET 10/08/21   Patient will demonstrate FOTO outcome > or = (69) % to indicate reduced disability due to condition. Goal status: MET 10/08/21   Pt will be able to report pain </= 2/10 in her low back after sitting more than 1 hour.  Goal status: MET 10/08/21       5.  Pt will improve her lumbar flexion to >/= 60 degrees with no pain reported.   Goal status: MET 09/26/21       PLAN: PT FREQUENCY: 1x/week   PT DURATION: 8 weeks   PLANNED  INTERVENTIONS: Therapeutic exercises, Therapeutic activity, Neuromuscular re-education, Balance training, Gait training, Patient/Family education, Joint mobilization, Stair training, Dry Needling, Electrical stimulation, Spinal mobilization, Cryotherapy, Moist heat, Taping, Traction, Ultrasound, and Manual therapy.   PLAN FOR NEXT SESSION: Follow-up on HEP compliance and home walking compliance.  Modify as needed.     Oretha Caprice, PT, MPT 10/08/2021, 8:29 AM    PHYSICAL THERAPY DISCHARGE SUMMARY  Visits from Start of Care: 5  Current functional level related to goals / functional outcomes: See above   Remaining deficits: See above   Education / Equipment: HEP   Patient agrees to discharge. Patient goals were met. Patient is being discharged due to meeting the stated rehab goals.

## 2021-10-11 ENCOUNTER — Other Ambulatory Visit: Payer: Self-pay | Admitting: Family Medicine

## 2021-10-11 DIAGNOSIS — I10 Essential (primary) hypertension: Secondary | ICD-10-CM

## 2021-10-23 NOTE — Progress Notes (Unsigned)
[facility] [address] [place] [address] Phone: [phone]   Assessment and Plan:     There are no diagnoses linked to this encounter.  ***   Pertinent previous records reviewed include ***   Follow Up: ***     Subjective:    I, Kathleen Jordan, am serving as a Neurosurgeon for Kathleen Jordan   Chief Complaint: right side low back pain    HPI:    07/03/2021 Patient is a 51 year old female complaining of right sided low back pain. Patient states that is a non stop pain, that [place], no numbness or tingling, pain radiates all the way across and all the way to the front, has been taking meloxicam and  flexeril at night and those don't seem to be helping, finds relief when she stands, sitting is very uncomfortable , has been sign heat and that doesn't seem to be helping at all    07/17/2021 Patient states this morning is the first morning that the pain is intermittent and not constant and intense    08/17/2021 Patient states shes is doing okay , still cant sit for a long period of time just started PT has to get used to it    09/21/2021 Patient states that her pain is doing a lot better. She only has issues with pain after PT or a very active day. She will use the meloxicam or the tizanidine on those days.    10/24/2021 Patient states    Relevant Historical Information: DM type II, hypertension, IBS Additional pertinent review of systems negative.   Current Outpatient Medications:    cetirizine (ZYRTEC ALLERGY) 10 MG tablet, Take 1 tablet (10 mg total) by mouth daily., Disp: 30 tablet, Rfl: 11   Ciclopirox 1 % shampoo, Massage into scalp , leave 5 min and rinse, Disp: 120 mL, Rfl: 0   clarithromycin (BIAXIN XL) 500 MG 24 hr tablet, Take 2 tablets (1,000 mg total) by mouth daily., Disp: 20 tablet, Rfl: 0   cyclobenzaprine (FLEXERIL) 10 MG tablet, Take 1 tablet (10 mg total) by  mouth 3 (three) times daily as needed for muscle spasms., Disp: 30 tablet, Rfl: 1   dicyclomine (BENTYL) 20 MG tablet, Take 20 mg by mouth every 6 (six) hours., Disp: , Rfl:    empagliflozin (JARDIANCE) 25 MG TABS tablet, Take 25 mg by mouth every evening. , Disp: , Rfl:    EPINEPHrine (AUVI-Q) 0.3 mg/0.3 mL IJ SOAJ injection, Inject 0.3 mLs (0.3 mg total) into the muscle as needed for anaphylaxis., Disp: 1 each, Rfl: 1   ezetimibe (ZETIA) 10 MG tablet, TAKE 1 TABLET(10 MG) BY MOUTH DAILY, Disp: 90 tablet, Rfl: 1   glimepiride (AMARYL) 1 MG tablet, Take by mouth., Disp: , Rfl:    glucose blood (CONTOUR NEXT TEST) test strip, Check BG 3 times/day.  Dx E11.65, Disp: , Rfl:    meloxicam (MOBIC) 15 MG tablet, TAKE 1/2 TO 1 TABLET BY MOUTH EVERY DAY AS NEEDED, Disp: 30 tablet, Rfl: 0   methocarbamol (ROBAXIN) 500 MG tablet, Take 1 tablet (500 mg total) by mouth 3 (three) times daily., Disp: 60 tablet, Rfl: 0   NIFEdipine (PROCARDIA XL/NIFEDICAL XL) 60 MG 24 hr tablet, TAKE 1 TABLET(60 MG) BY MOUTH DAILY, Disp: 90 tablet, Rfl: 1   rosuvastatin (CRESTOR) 10 MG tablet, TAKE 1 TABLET(10 MG) BY MOUTH AT BEDTIME, Disp: 30 tablet, Rfl: 2  spironolactone (ALDACTONE) 25 MG tablet, TAKE 1 TABLET(25 MG) BY MOUTH DAILY, Disp: 30 tablet, Rfl: 0   tiZANidine (ZANAFLEX) 4 MG tablet, Take 1 tablet (4 mg total) by mouth every 8 (eight) hours as needed for muscle spasms., Disp: 40 tablet, Rfl: 0   TRESIBA FLEXTOUCH 200 UNIT/ML FlexTouch Pen, Inject into the skin., Disp: , Rfl:    TRULICITY 3 MG/0.5ML SOPN, Inject into the skin., Disp: , Rfl:    Objective:     There were no vitals filed for this visit.    There is no height or weight on file to calculate BMI.    Physical Exam:    ***   Electronically signed by:  Kathleen [facility] 7:49 AM 10/23/21

## 2021-10-24 ENCOUNTER — Ambulatory Visit: Payer: BC Managed Care – PPO | Admitting: Sports Medicine

## 2021-10-24 VITALS — BP 100/80 | HR 82 | Ht 68.0 in | Wt 235.0 lb

## 2021-10-24 DIAGNOSIS — M545 Low back pain, unspecified: Secondary | ICD-10-CM

## 2021-10-24 DIAGNOSIS — G8929 Other chronic pain: Secondary | ICD-10-CM

## 2021-10-24 DIAGNOSIS — S39011A Strain of muscle, fascia and tendon of abdomen, initial encounter: Secondary | ICD-10-CM

## 2021-10-24 NOTE — Patient Instructions (Signed)
Good to see you   

## 2021-11-05 ENCOUNTER — Ambulatory Visit: Payer: BC Managed Care – PPO | Admitting: Sports Medicine

## 2021-11-07 ENCOUNTER — Other Ambulatory Visit: Payer: Self-pay | Admitting: Family Medicine

## 2021-11-07 DIAGNOSIS — M5441 Lumbago with sciatica, right side: Secondary | ICD-10-CM

## 2021-11-07 DIAGNOSIS — M25551 Pain in right hip: Secondary | ICD-10-CM

## 2021-11-09 ENCOUNTER — Encounter: Payer: Self-pay | Admitting: Sports Medicine

## 2021-11-12 NOTE — Telephone Encounter (Signed)
Pt was contact and told to reach out to medical records so they could send them for her she was given phone number

## 2021-12-25 ENCOUNTER — Other Ambulatory Visit: Payer: Self-pay | Admitting: Family Medicine

## 2021-12-25 DIAGNOSIS — Z8742 Personal history of other diseases of the female genital tract: Secondary | ICD-10-CM

## 2022-01-22 ENCOUNTER — Ambulatory Visit: Payer: BC Managed Care – PPO | Admitting: Family Medicine

## 2022-01-22 VITALS — BP 104/80 | HR 89 | Temp 98.9°F | Resp 18 | Ht 68.0 in | Wt 242.0 lb

## 2022-01-22 DIAGNOSIS — E785 Hyperlipidemia, unspecified: Secondary | ICD-10-CM

## 2022-01-22 DIAGNOSIS — Z23 Encounter for immunization: Secondary | ICD-10-CM

## 2022-01-22 DIAGNOSIS — M79661 Pain in right lower leg: Secondary | ICD-10-CM | POA: Diagnosis not present

## 2022-01-22 MED ORDER — EZETIMIBE 10 MG PO TABS: ORAL_TABLET | ORAL | 1 refills | Status: DC

## 2022-01-22 MED ORDER — ROSUVASTATIN CALCIUM 10 MG PO TABS: ORAL_TABLET | ORAL | 1 refills | Status: DC

## 2022-01-22 NOTE — Progress Notes (Addendum)
Subjective:   By signing my name below, I, Seabron Spates, attest that this documentation has been prepared under the direction and in the presence of Seabron Spates, 01/22/2022.   Patient ID: Kathleen Jordan, female    DOB: 1970/07/23, 51 y.o.   MRN: [id number]  Chief Complaint  Patient presents with   Leg Pain    Right calf pain, Pain started on Sunday. No redness or swelling. Some pain with walking and touch    HPI Patient is in today for an office visit.  Right calf pain Patient is complaining of sudden onset right calf pain. The pain is localized in calf and worsens at night. There is pain with palpation and denies any swelling, redness, SOB, or restless legs.  Hyperlipidemia Patient reports she has not been compliant with her cholesterol medication.  Immunizations She is receiving an influenza vaccine this visit.  Health Maintenance Due  Topic Date Due   OPHTHALMOLOGY EXAM  12/11/2018   FOOT EXAM  06/12/2019   COVID-19 Vaccine (4 - Pfizer series) 03/31/2020   PAP SMEAR-Modifier  03/30/2021   Diabetic kidney evaluation - Urine ACR  06/23/2021   HEMOGLOBIN A1C  08/07/2021   INFLUENZA VACCINE  10/30/2021    Past Medical History:  Diagnosis Date   Allergic rhinitis    Anal fissure 07/15/2017   Chronic constipation    Frequency of urination    GERD (gastroesophageal reflux disease)    History of palpitations    Hx of varicella    Hyperlipidemia    Hypertension    Menorrhagia    OSA (obstructive sleep apnea)    08-07-2017 no cpap due to finanaces   PCOS (polycystic ovarian syndrome)    Peripheral neuropathy    Thyroid goiter    Type 2 diabetes mellitus with hyperglycemia, with long-term current use of insulin (HCC)    Urgency of urination     Past Surgical History:  Procedure Laterality Date   BREAST REDUCTION SURGERY Bilateral 07-18-2003   dr Shon Hough  [facility]   CHOLECYSTECTOMY N/A 02/15/2019   Procedure: LAPAROSCOPIC CHOLECYSTECTOMY;   Surgeon: Axel Filler, MD;  Location: [facility] OR;  Service: General;  Laterality: N/A;   COLONOSCOPY WITH PROPOFOL N/A 07/15/2017   Procedure: COLONOSCOPY WITH PROPOFOL;  Surgeon: Hilarie Fredrickson, MD;  Location: [facility];  Service: Endoscopy;  Laterality: N/A;   DILITATION & CURRETTAGE/HYSTROSCOPY WITH HYDROTHERMAL ABLATION N/A 03/06/2018   Procedure: DILATATION & CURETTAGE/HYSTEROSCOPY WITH HYDROTHERMAL ABLATION, INSERTION OF INTRAUTERINE DEVICE;  Surgeon: Hal Morales, MD;  Location: [facility];  Service: Gynecology;  Laterality: N/A;   invitro fertilization  11/16/2010   [facility]    Family History  Problem Relation Age of Onset   Arthritis Mother    Diabetes Mother    Hypertension Mother    Heart disease Mother 54       MI   Stroke Mother    Congestive Heart Failure Mother    Allergic rhinitis Mother    Asthma Mother    Allergic rhinitis Sister    Diabetes Brother    Hypertension Brother    Arthritis Maternal Grandmother    Diabetes Maternal Grandmother    Hypertension Maternal Grandmother    Cancer Maternal Grandmother    Stroke Maternal Grandfather    Cancer Maternal Grandfather    Asthma Sister    Allergies Sister     Social History   Socioeconomic History   Marital status: Married    Spouse name: Caryn Bee  Number of children: 1   Years of education: Masters   Highest education level: Not on file  Occupational History   Occupation: Water quality scientist: [employer]    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 [place]  Tobacco Use   Smoking status: Never    Passive exposure: Yes   Smokeless tobacco: Never   Tobacco comments:    spouse smokes outside the home  Vaping Use   Vaping Use: Never used  Substance and Sexual Activity   Alcohol use: Yes    Comment: social   Drug use: No   Sexual activity: Yes    Partners: Male    Birth control/protection: None  Other Topics Concern   Not on file  Social History Narrative    Drinks caffeine 2x a week    Social Determinants of Radio broadcast assistant Strain: Not on file  Food Insecurity: Not on file  Transportation Needs: Not on file  Physical Activity: Not on file  Stress: Not on file  Social Connections: Not on file  Intimate Partner Violence: Not on file    Outpatient Medications Prior to Visit  Medication Sig Dispense Refill   cetirizine (ZYRTEC ALLERGY) 10 MG tablet Take 1 tablet (10 mg total) by mouth daily. 30 tablet 11   cyclobenzaprine (FLEXERIL) 10 MG tablet Take 1 tablet (10 mg total) by mouth 3 (three) times daily as needed for muscle spasms. 30 tablet 1   dicyclomine (BENTYL) 20 MG tablet Take 20 mg by mouth every 6 (six) hours.     empagliflozin (JARDIANCE) 25 MG TABS tablet Take 25 mg by mouth every evening.      EPINEPHrine (AUVI-Q) 0.3 mg/0.3 mL IJ SOAJ injection Inject 0.3 mLs (0.3 mg total) into the muscle as needed for anaphylaxis. 1 each 1   glimepiride (AMARYL) 1 MG tablet Take by mouth.     glucose blood (CONTOUR NEXT TEST) test strip Check BG 3 times/day.  Dx E11.65     meloxicam (MOBIC) 15 MG tablet TAKE 1/2 TO 1 TABLET BY MOUTH EVERY DAY AS NEEDED 30 tablet 0   methocarbamol (ROBAXIN) 500 MG tablet Take 1 tablet (500 mg total) by mouth 3 (three) times daily. 60 tablet 0   NIFEdipine (PROCARDIA XL/NIFEDICAL XL) 60 MG 24 hr tablet TAKE 1 TABLET(60 MG) BY MOUTH DAILY 90 tablet 1   spironolactone (ALDACTONE) 25 MG tablet TAKE 1 TABLET(25 MG) BY MOUTH DAILY 30 tablet 0   tiZANidine (ZANAFLEX) 4 MG tablet Take 1 tablet (4 mg total) by mouth every 8 (eight) hours as needed for muscle spasms. 40 tablet 0   TRESIBA FLEXTOUCH 200 UNIT/ML FlexTouch Pen Inject into the skin.     TRULICITY 3 XB/1.4NW SOPN Inject into the skin.     ezetimibe (ZETIA) 10 MG tablet TAKE 1 TABLET(10 MG) BY MOUTH DAILY 90 tablet 1   rosuvastatin (CRESTOR) 10 MG tablet TAKE 1 TABLET(10 MG) BY MOUTH AT BEDTIME 30 tablet 2   Ciclopirox 1 % shampoo Massage into scalp  , leave 5 min and rinse 120 mL 0   clarithromycin (BIAXIN XL) 500 MG 24 hr tablet Take 2 tablets (1,000 mg total) by mouth daily. (Patient not taking: Reported on 01/22/2022) 20 tablet 0   No facility-administered medications prior to visit.    No Known Allergies  Review of Systems  Constitutional:  Negative for fever and malaise/fatigue.  HENT:  Negative for congestion.   Eyes:  Negative for blurred vision.  Respiratory:  Negative for shortness of breath.   Cardiovascular:  Negative for chest pain, palpitations and leg swelling.  Gastrointestinal:  Negative for abdominal pain, blood in stool and nausea.  Genitourinary:  Negative for dysuria and frequency.  Musculoskeletal:  Negative for falls.       (+) right calf pain  Skin:  Negative for rash.  Neurological:  Negative for dizziness, loss of consciousness and headaches.  Endo/Heme/Allergies:  Negative for environmental allergies.  Psychiatric/Behavioral:  Negative for depression. The patient is not nervous/anxious.        Objective:    Physical Exam Vitals and nursing note reviewed.  Constitutional:      General: She is not in acute distress.    Appearance: Normal appearance. She is well-developed. She is not ill-appearing.  HENT:     Head: Normocephalic and atraumatic.     Right Ear: External ear normal.     Left Ear: External ear normal.  Eyes:     Extraocular Movements: Extraocular movements intact.     Conjunctiva/sclera: Conjunctivae normal.     Pupils: Pupils are equal, round, and reactive to light.  Neck:     Thyroid: No thyromegaly.     Vascular: No carotid bruit or JVD.  Cardiovascular:     Rate and Rhythm: Normal rate and regular rhythm.     Heart sounds: Normal heart sounds. No murmur heard.    No gallop.  Pulmonary:     Effort: Pulmonary effort is normal. No respiratory distress.     Breath sounds: Normal breath sounds. No wheezing or rales.  Chest:     Chest wall: No tenderness.  Musculoskeletal:      Cervical back: Normal range of motion and neck supple.     Right lower leg: Tenderness present. No swelling.     Left lower leg: No swelling or tenderness.     Comments: + calf pain No swelling but very tight land tender to touch no rash  Skin:    General: Skin is warm and dry.  Neurological:     Mental Status: She is alert and oriented to person, place, and time.  Psychiatric:        Judgment: Judgment normal.     BP 104/80 (BP Location: Left Arm, Patient Position: Sitting, Cuff Size: Normal)   Pulse 89   Temp 98.9 F (37.2 C) (Oral)   Resp 18   Ht 5\' 8"  (1.727 m)   Wt 242 lb (109.8 kg)   SpO2 98%   BMI 36.80 kg/m  Wt Readings from Last 3 Encounters:  01/22/22 242 lb (109.8 kg)  10/24/21 235 lb (106.6 kg)  09/21/21 235 lb (106.6 kg)       Assessment & Plan:   Problem List Items Addressed This Visit   None Visit Diagnoses     Right calf pain    -  Primary   Relevant Orders   09/23/21 Venous Img Lower Unilateral Right (DVT)   Hyperlipidemia, unspecified hyperlipidemia type       Relevant Medications   rosuvastatin (CRESTOR) 10 MG tablet   ezetimibe (ZETIA) 10 MG tablet   Need for influenza vaccination       Relevant Orders   Flu Vaccine QUAD 54mo+IM (Fluarix, Fluzone & Alfiuria Quad PF)      Meds ordered this encounter  Medications   rosuvastatin (CRESTOR) 10 MG tablet    Sig: TAKE 1 TABLET(10 MG) BY MOUTH AT BEDTIME    Dispense:  90 tablet  Refill:  1   ezetimibe (ZETIA) 10 MG tablet    Sig: TAKE 1 TABLET(10 MG) BY MOUTH DAILY    Dispense:  90 tablet    Refill:  1    I, Seabron Spates, personally preformed the services described in this documentation.  All medical record entries made by the scribe were at my direction and in my presence.  I have reviewed the chart and discharge instructions (if applicable) and agree that the record reflects my personal performance and is accurate and complete. 01/22/2022.   I,Verona Buck,acting as a Neurosurgeon for [employer], DO.,have documented all relevant documentation on the behalf of [name], DO,as directed by  [name], DO while in the presence of [name], DO.    [name], DO

## 2022-01-23 ENCOUNTER — Encounter: Payer: Self-pay | Admitting: Family Medicine

## 2022-01-23 ENCOUNTER — Ambulatory Visit (HOSPITAL_BASED_OUTPATIENT_CLINIC_OR_DEPARTMENT_OTHER): Admission: RE | Admit: 2022-01-23 | Payer: BC Managed Care – PPO | Source: Ambulatory Visit

## 2022-01-24 ENCOUNTER — Ambulatory Visit (HOSPITAL_BASED_OUTPATIENT_CLINIC_OR_DEPARTMENT_OTHER)
Admission: RE | Admit: 2022-01-24 | Discharge: 2022-01-24 | Disposition: A | Discharge status: Home / Self Care | Payer: BC Managed Care – PPO | Source: Ambulatory Visit | Attending: Family Medicine | Admitting: Family Medicine

## 2022-01-24 DIAGNOSIS — M79661 Pain in right lower leg: Secondary | ICD-10-CM | POA: Diagnosis present

## 2022-02-05 ENCOUNTER — Telehealth (INDEPENDENT_AMBULATORY_CARE_PROVIDER_SITE_OTHER): Payer: BC Managed Care – PPO | Admitting: Family Medicine

## 2022-02-05 ENCOUNTER — Encounter: Payer: Self-pay | Admitting: Family Medicine

## 2022-02-05 VITALS — BP 121/72 | HR 91 | Temp 97.2°F

## 2022-02-05 DIAGNOSIS — J0140 Acute pansinusitis, unspecified: Secondary | ICD-10-CM

## 2022-02-05 DIAGNOSIS — H9203 Otalgia, bilateral: Secondary | ICD-10-CM

## 2022-02-05 MED ORDER — OFLOXACIN 0.3 % OT SOLN: 10.0000 [drp] | Freq: Every day | OTIC | 0 refills | Status: DC

## 2022-02-05 MED ORDER — PROMETHAZINE-DM 6.25-15 MG/5ML PO SYRP: 5.0000 mL | ORAL_SOLUTION | Freq: Four times a day (QID) | ORAL | 0 refills | Status: DC | PRN

## 2022-02-05 MED ORDER — CEFDINIR 300 MG PO CAPS: 300.0000 mg | ORAL_CAPSULE | Freq: Two times a day (BID) | ORAL | 0 refills | Status: DC

## 2022-02-05 NOTE — Patient Instructions (Signed)

## 2022-02-05 NOTE — Progress Notes (Signed)
MyChart Video Visit    Virtual Visit via Video Note   This visit type was conducted due to national recommendations for restrictions regarding the COVID-19 Pandemic (e.g. social distancing) in an effort to limit this patient's exposure and mitigate transmission in our community. This patient is at least at moderate risk for complications without adequate follow up. This format is felt to be most appropriate for this patient at this time. Physical exam was limited by quality of the video and audio technology used for the visit. heather was able to get the patient set up on a video visit.  Patient location: Home Patient and provider in visit Provider location: Office  I discussed the limitations of evaluation and management by telemedicine and the availability of in person appointments. The patient expressed understanding and agreed to proceed.  Visit Date: 02/05/2022  Today's healthcare provider: Ann Held, DO     Subjective:    Patient ID: Kathleen Jordan, female    DOB: June 04, 1970, 51 y.o.   MRN: [id number]  Chief Complaint  Patient presents with   Cough    Cough Associated symptoms include ear pain. Pertinent negatives include no chest pain, fever, headaches, rash or shortness of breath. There is no history of environmental allergies.   Patient is in today for a video visit.   She complains of cough, frontal and maxillary sinus pressure and congestion for the past week since 01/30/2022. She has recently developed sore ears. She denies having any fevers. She has not taken a Covid-19 test. She is using mucinex, Astelin nasal spray, OTC afrin nasal spray and reports mild improvement in her symptoms. She has tried Flonase in the past but found no relief in her symptoms. She continues taking zyrtec daily. Her blood pressure measured at home is 121/72. Her pulse is 91. Her temperature is 97.2 degrees F. She reports her blood sugar is measuring high around 200.     Past Medical History:  Diagnosis Date   Allergic rhinitis    Anal fissure 07/15/2017   Chronic constipation    Frequency of urination    GERD (gastroesophageal reflux disease)    History of palpitations    Hx of varicella    Hyperlipidemia    Hypertension    Menorrhagia    OSA (obstructive sleep apnea)    08-07-2017 no cpap due to finanaces   PCOS (polycystic ovarian syndrome)    Peripheral neuropathy    Thyroid goiter    Type 2 diabetes mellitus with hyperglycemia, with long-term current use of insulin [facility])    Urgency of urination     Past Surgical History:  Procedure Laterality Date   BREAST REDUCTION SURGERY Bilateral 07-18-2003   dr Towanda Malkin  [facility]   CHOLECYSTECTOMY N/A 02/15/2019   Procedure: LAPAROSCOPIC CHOLECYSTECTOMY;  Surgeon: Ralene Ok, MD;  Location: [place];  Service: General;  Laterality: N/A;   COLONOSCOPY WITH PROPOFOL N/A 07/15/2017   Procedure: COLONOSCOPY WITH PROPOFOL;  Surgeon: Irene Shipper, MD;  Location: [facility];  Service: Endoscopy;  Laterality: N/A;   DILITATION & CURRETTAGE/HYSTROSCOPY WITH HYDROTHERMAL ABLATION N/A 03/06/2018   Procedure: DILATATION & CURETTAGE/HYSTEROSCOPY WITH HYDROTHERMAL ABLATION, INSERTION OF INTRAUTERINE DEVICE;  Surgeon: Eldred Manges, MD;  Location: [place];  Service: Gynecology;  Laterality: N/A;   invitro fertilization  11/16/2010   [facility]    Family History  Problem Relation Age of Onset   Arthritis Mother    Diabetes Mother    Hypertension Mother  Heart disease Mother 67       MI   Stroke Mother    Congestive Heart Failure Mother    Allergic rhinitis Mother    Asthma Mother    Allergic rhinitis Sister    Diabetes Brother    Hypertension Brother    Arthritis Maternal Grandmother    Diabetes Maternal Grandmother    Hypertension Maternal Grandmother    Cancer Maternal Grandmother    Stroke Maternal Grandfather    Cancer Maternal Grandfather    Asthma Sister     Allergies Sister     Social History   Socioeconomic History   Marital status: Married    Spouse name: Lennette Bihari   Number of children: 1   Years of education: Masters   Highest education level: Not on file  Occupational History   Occupation: Water quality scientist: [employer]    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 [place]  Tobacco Use   Smoking status: Never    Passive exposure: Yes   Smokeless tobacco: Never   Tobacco comments:    spouse smokes outside the home  Vaping Use   Vaping Use: Never used  Substance and Sexual Activity   Alcohol use: Yes    Comment: social   Drug use: No   Sexual activity: Yes    Partners: Male    Birth control/protection: None  Other Topics Concern   Not on file  Social History Narrative   Drinks caffeine 2x a week    Social Determinants of Radio broadcast assistant Strain: Not on file  Food Insecurity: Not on file  Transportation Needs: Not on file  Physical Activity: Not on file  Stress: Not on file  Social Connections: Not on file  Intimate Partner Violence: Not on file    Outpatient Medications Prior to Visit  Medication Sig Dispense Refill   cetirizine (ZYRTEC ALLERGY) 10 MG tablet Take 1 tablet (10 mg total) by mouth daily. 30 tablet 11   cyclobenzaprine (FLEXERIL) 10 MG tablet Take 1 tablet (10 mg total) by mouth 3 (three) times daily as needed for muscle spasms. 30 tablet 1   dicyclomine (BENTYL) 20 MG tablet Take 20 mg by mouth every 6 (six) hours.     empagliflozin (JARDIANCE) 25 MG TABS tablet Take 25 mg by mouth every evening.      EPINEPHrine (AUVI-Q) 0.3 mg/0.3 mL IJ SOAJ injection Inject 0.3 mLs (0.3 mg total) into the muscle as needed for anaphylaxis. 1 each 1   ezetimibe (ZETIA) 10 MG tablet TAKE 1 TABLET(10 MG) BY MOUTH DAILY 90 tablet 1   glimepiride (AMARYL) 1 MG tablet Take by mouth.     glucose blood (CONTOUR NEXT TEST) test strip Check BG 3 times/day.  Dx E11.65     meloxicam (MOBIC) 15 MG tablet  TAKE 1/2 TO 1 TABLET BY MOUTH EVERY DAY AS NEEDED 30 tablet 0   methocarbamol (ROBAXIN) 500 MG tablet Take 1 tablet (500 mg total) by mouth 3 (three) times daily. 60 tablet 0   NIFEdipine (PROCARDIA XL/NIFEDICAL XL) 60 MG 24 hr tablet TAKE 1 TABLET(60 MG) BY MOUTH DAILY 90 tablet 1   rosuvastatin (CRESTOR) 10 MG tablet TAKE 1 TABLET(10 MG) BY MOUTH AT BEDTIME 90 tablet 1   spironolactone (ALDACTONE) 25 MG tablet TAKE 1 TABLET(25 MG) BY MOUTH DAILY 30 tablet 0   tiZANidine (ZANAFLEX) 4 MG tablet Take 1 tablet (4 mg total) by mouth every 8 (eight) hours as needed for  muscle spasms. 40 tablet 0   TRESIBA FLEXTOUCH 200 UNIT/ML FlexTouch Pen Inject into the skin.     TRULICITY 3 IR/4.4RX SOPN Inject into the skin.     No facility-administered medications prior to visit.    No Known Allergies  Review of Systems  Constitutional:  Negative for fever and malaise/fatigue.  HENT:  Positive for congestion and ear pain.        (+)frontal and maxillary sinus pressure  Eyes:  Negative for blurred vision.  Respiratory:  Positive for cough. Negative for shortness of breath.   Cardiovascular:  Negative for chest pain, palpitations and leg swelling.  Gastrointestinal:  Negative for abdominal pain, blood in stool and nausea.  Genitourinary:  Negative for dysuria and frequency.  Musculoskeletal:  Negative for falls.  Skin:  Negative for rash.  Neurological:  Negative for dizziness, loss of consciousness and headaches.  Endo/Heme/Allergies:  Negative for environmental allergies.  Psychiatric/Behavioral:  Negative for depression. The patient is not nervous/anxious.        Objective:    Physical Exam Vitals and nursing note reviewed.  Constitutional:      Appearance: She is well-developed.  HENT:     Nose:     Right Sinus: Maxillary sinus tenderness and frontal sinus tenderness present.     Left Sinus: Maxillary sinus tenderness and frontal sinus tenderness present.  Eyes:     Conjunctiva/sclera:  Conjunctivae normal.  Neck:     Thyroid: No thyromegaly.     Vascular: No carotid bruit or JVD.  Pulmonary:     Effort: Pulmonary effort is normal.  Neurological:     Mental Status: She is alert and oriented to person, place, and time.     BP 121/72   Pulse 91   Temp (!) 97.2 F (36.2 C)  Wt Readings from Last 3 Encounters:  01/22/22 242 lb (109.8 kg)  10/24/21 235 lb (106.6 kg)  09/21/21 235 lb (106.6 kg)    Diabetic Foot Exam - Simple   No data filed    Lab Results  Component Value Date   WBC 8.6 06/12/2021   HGB 14.9 06/12/2021   HCT 43 06/12/2021   PLT 279 06/12/2021   GLUCOSE 169 (H) 02/07/2021   CHOL 200 02/18/2020   TRIG 82.0 02/18/2020   HDL 44.30 02/18/2020   LDLDIRECT 139.3 11/20/2010   LDLCALC 139 (H) 02/18/2020   ALT 57 (A) 06/12/2021   AST 38 (A) 06/12/2021   NA 139 06/12/2021   K 4.0 06/12/2021   CL 105 06/12/2021   CREATININE 0.9 06/12/2021   BUN 17 06/12/2021   CO2 25 (A) 06/12/2021   TSH 1.89 02/18/2020   HGBA1C 13.0 (H) 02/07/2021   MICROALBUR 1.2 02/18/2020    Lab Results  Component Value Date   TSH 1.89 02/18/2020   Lab Results  Component Value Date   WBC 8.6 06/12/2021   HGB 14.9 06/12/2021   HCT 43 06/12/2021   MCV 88.6 02/18/2020   PLT 279 06/12/2021   Lab Results  Component Value Date   NA 139 06/12/2021   K 4.0 06/12/2021   CO2 25 (A) 06/12/2021   GLUCOSE 169 (H) 02/07/2021   BUN 17 06/12/2021   CREATININE 0.9 06/12/2021   BILITOT 0.4 02/07/2021   ALKPHOS 83 06/12/2021   AST 38 (A) 06/12/2021   ALT 57 (A) 06/12/2021   PROT 7.3 02/07/2021   ALBUMIN 4.4 06/12/2021   CALCIUM 9.9 06/12/2021   ANIONGAP 11 02/10/2019   EGFR 78  06/12/2021   GFR 87.31 02/07/2021   Lab Results  Component Value Date   CHOL 200 02/18/2020   Lab Results  Component Value Date   HDL 44.30 02/18/2020   Lab Results  Component Value Date   LDLCALC 139 (H) 02/18/2020   Lab Results  Component Value Date   TRIG 82.0 02/18/2020   Lab  Results  Component Value Date   CHOLHDL 5 02/18/2020   Lab Results  Component Value Date   HGBA1C 13.0 (H) 02/07/2021       Assessment & Plan:   Problem List Items Addressed This Visit       Unprioritized   Acute non-recurrent pansinusitis - Primary   Relevant Medications   cefdinir (OMNICEF) 300 MG capsule   promethazine-dextromethorphan (PROMETHAZINE-DM) 6.25-15 MG/5ML syrup   Other Visit Diagnoses     Otalgia of both ears       Relevant Medications   ofloxacin (FLOXIN OTIC) 0.3 % OTIC solution         Meds ordered this encounter  Medications   cefdinir (OMNICEF) 300 MG capsule    Sig: Take 1 capsule (300 mg total) by mouth 2 (two) times daily.    Dispense:  20 capsule    Refill:  0   promethazine-dextromethorphan (PROMETHAZINE-DM) 6.25-15 MG/5ML syrup    Sig: Take 5 mLs by mouth 4 (four) times daily as needed.    Dispense:  118 mL    Refill:  0   ofloxacin (FLOXIN OTIC) 0.3 % OTIC solution    Sig: Place 10 drops into both ears daily.    Dispense:  5 mL    Refill:  0    I discussed the assessment and treatment plan with the patient. The patient was provided an opportunity to ask questions and all were answered. The patient agreed with the plan and demonstrated an understanding of the instructions.   The patient was advised to call back or seek an in-person evaluation if the symptoms worsen or if the condition fails to improve as anticipated.  I provided 20 minutes of face-to-face time during this encounter.   I,Shehryar Baig,acting as a Education administrator for [employer], DO.,have documented all relevant documentation on the behalf of Ann Held, DO,as directed by  Ann Held, DO while in the presence of Ann Held, DO.   Ann Held, DO [place] at [employer] [phone] (phone) [phone] (fax)  [place]

## 2022-02-07 ENCOUNTER — Encounter: Payer: Self-pay | Admitting: Family Medicine

## 2022-02-07 ENCOUNTER — Other Ambulatory Visit: Payer: Self-pay | Admitting: Family Medicine

## 2022-02-07 DIAGNOSIS — J0140 Acute pansinusitis, unspecified: Secondary | ICD-10-CM

## 2022-02-07 MED ORDER — DOXYCYCLINE HYCLATE 100 MG PO TABS: 100.0000 mg | ORAL_TABLET | Freq: Two times a day (BID) | ORAL | 0 refills | Status: DC

## 2022-02-07 NOTE — Telephone Encounter (Signed)
Pt.notified

## 2022-02-08 ENCOUNTER — Other Ambulatory Visit: Payer: Self-pay | Admitting: *Deleted

## 2022-02-08 DIAGNOSIS — Z8742 Personal history of other diseases of the female genital tract: Secondary | ICD-10-CM

## 2022-02-08 MED ORDER — SPIRONOLACTONE 25 MG PO TABS: ORAL_TABLET | ORAL | 1 refills | Status: DC

## 2022-05-09 ENCOUNTER — Telehealth: Payer: BC Managed Care – PPO | Admitting: Physician Assistant

## 2022-05-09 ENCOUNTER — Encounter: Payer: Self-pay | Admitting: Family Medicine

## 2022-05-09 DIAGNOSIS — I10 Essential (primary) hypertension: Secondary | ICD-10-CM

## 2022-05-09 DIAGNOSIS — U071 COVID-19: Secondary | ICD-10-CM

## 2022-05-09 MED ORDER — NIFEDIPINE ER OSMOTIC RELEASE 60 MG PO TB24: ORAL_TABLET | ORAL | 1 refills | Status: DC

## 2022-05-09 MED ORDER — NIRMATRELVIR/RITONAVIR (PAXLOVID)TABLET: 3.0000 | ORAL_TABLET | Freq: Two times a day (BID) | ORAL | 0 refills | Status: AC

## 2022-05-09 MED ORDER — BENZONATATE 100 MG PO CAPS: 100.0000 mg | ORAL_CAPSULE | Freq: Three times a day (TID) | ORAL | 0 refills | Status: DC | PRN

## 2022-05-09 MED ORDER — ALBUTEROL SULFATE HFA 108 (90 BASE) MCG/ACT IN AERS: 2.0000 | INHALATION_SPRAY | Freq: Four times a day (QID) | RESPIRATORY_TRACT | 0 refills | Status: DC | PRN

## 2022-05-09 MED ORDER — MOLNUPIRAVIR EUA 200MG CAPSULE: 4.0000 | ORAL_CAPSULE | Freq: Two times a day (BID) | ORAL | 0 refills | Status: DC

## 2022-05-09 NOTE — Patient Instructions (Addendum)
Ethelwyn Miguel Rota, thank you for joining Leeanne Rio, PA-C for today's virtual visit.  While this provider is not your primary care provider (PCP), if your PCP is located in our provider database this encounter information will be shared with them immediately following your visit.   [facility] account gives you access to today's visit and all your visits, tests, and labs performed at [facility] " click here if you don't have a [facility] account or go to [email or web address]  Consent: (Patient) Kathleen Jordan provided verbal consent for this virtual visit at the beginning of the encounter.  Current Medications:  Current Outpatient Medications:    cefdinir (OMNICEF) 300 MG capsule, Take 1 capsule (300 mg total) by mouth 2 (two) times daily., Disp: 20 capsule, Rfl: 0   cetirizine (ZYRTEC ALLERGY) 10 MG tablet, Take 1 tablet (10 mg total) by mouth daily., Disp: 30 tablet, Rfl: 11   cyclobenzaprine (FLEXERIL) 10 MG tablet, Take 1 tablet (10 mg total) by mouth 3 (three) times daily as needed for muscle spasms., Disp: 30 tablet, Rfl: 1   dicyclomine (BENTYL) 20 MG tablet, Take 20 mg by mouth every 6 (Jordan) hours., Disp: , Rfl:    doxycycline (VIBRA-TABS) 100 MG tablet, Take 1 tablet (100 mg total) by mouth 2 (two) times daily., Disp: 20 tablet, Rfl: 0   empagliflozin (JARDIANCE) 25 MG TABS tablet, Take 25 mg by mouth every evening. , Disp: , Rfl:    EPINEPHrine (AUVI-Q) 0.3 mg/0.3 mL IJ SOAJ injection, Inject 0.3 mLs (0.3 mg total) into the muscle as needed for anaphylaxis., Disp: 1 each, Rfl: 1   ezetimibe (ZETIA) 10 MG tablet, TAKE 1 TABLET(10 MG) BY MOUTH DAILY, Disp: 90 tablet, Rfl: 1   glimepiride (AMARYL) 1 MG tablet, Take by mouth., Disp: , Rfl:    glucose blood (CONTOUR NEXT TEST) test strip, Check BG 3 times/day.  Dx E11.65, Disp: , Rfl:    meloxicam (MOBIC) 15 MG tablet, TAKE 1/2 TO 1 TABLET BY MOUTH EVERY DAY AS NEEDED, Disp: 30  tablet, Rfl: 0   methocarbamol (ROBAXIN) 500 MG tablet, Take 1 tablet (500 mg total) by mouth 3 (three) times daily., Disp: 60 tablet, Rfl: 0   NIFEdipine (PROCARDIA XL/NIFEDICAL XL) 60 MG 24 hr tablet, Take 1 tablet by mouth daily, Disp: 90 tablet, Rfl: 1   ofloxacin (FLOXIN OTIC) 0.3 % OTIC solution, Place 10 drops into both ears daily., Disp: 5 mL, Rfl: 0   promethazine-dextromethorphan (PROMETHAZINE-DM) 6.25-15 MG/5ML syrup, Take 5 mLs by mouth 4 (four) times daily as needed., Disp: 118 mL, Rfl: 0   rosuvastatin (CRESTOR) 10 MG tablet, TAKE 1 TABLET(10 MG) BY MOUTH AT BEDTIME, Disp: 90 tablet, Rfl: 1   spironolactone (ALDACTONE) 25 MG tablet, TAKE 1 TABLET(25 MG) BY MOUTH DAILY, Disp: 90 tablet, Rfl: 1   tiZANidine (ZANAFLEX) 4 MG tablet, Take 1 tablet (4 mg total) by mouth every 8 (eight) hours as needed for muscle spasms., Disp: 40 tablet, Rfl: 0   TRESIBA FLEXTOUCH 200 UNIT/ML FlexTouch Pen, Inject into the skin., Disp: , Rfl:    TRULICITY 3 OE/4.2PN SOPN, Inject into the skin., Disp: , Rfl:    Medications ordered in this encounter:  No orders of the defined types were placed in this encounter.    *If you need refills on other medications prior to your next appointment, please contact your pharmacy*  Follow-Up: Call back or seek an in-person evaluation if the symptoms worsen or if the  condition fails to improve as anticipated.  [facility] [phone]  Other Instructions Please keep well-hydrated and get plenty of rest. Start a saline nasal rinse to flush out your nasal passages. You can use plain Mucinex to help thin congestion. If you have a humidifier, running in the bedroom at night. I want you to start OTC vitamin D3 1000 units daily, vitamin C 1000 mg daily, and a zinc supplement. Please take prescribed medications as directed.  You were to quarantine for 5 days from onset of your symptoms.  After day 5, if you have had no fever and you are feeling better,  you can end quarantine but need to mask for an additional 5 days. After day 5 if you have a fever or are having significant symptoms, please quarantine for full 10 days.  If you note any worsening of symptoms, any significant shortness of breath or any chest pain, please seek ER evaluation ASAP.  Please do not delay care!  COVID-19: What to Do if You Are Sick If you test positive and are an older adult or someone who is at high risk of getting very sick from COVID-19, treatment may be available. Contact a healthcare provider right away after a positive test to determine if you are eligible, even if your symptoms are mild right now. You can also visit a Test to Treat location and, if eligible, receive a prescription from a provider. Don't delay: Treatment must be started within the first few days to be effective. If you have a fever, cough, or other symptoms, you might have COVID-19. Most people have mild illness and are able to recover at home. If you are sick: Keep track of your symptoms. If you have an emergency warning sign (including trouble breathing), call 911. Steps to help prevent the spread of COVID-19 if you are sick If you are sick with COVID-19 or think you might have COVID-19, follow the steps below to care for yourself and to help protect other people in your home and community. Stay home except to get medical care Stay home. Most people with COVID-19 have mild illness and can recover at home without medical care. Do not leave your home, except to get medical care. Do not visit public areas and do not go to places where you are unable to wear a mask. Take care of yourself. Get rest and stay hydrated. Take over-the-counter medicines, such as acetaminophen, to help you feel better. Stay in touch with your doctor. Call before you get medical care. Be sure to get care if you have trouble breathing, or have any other emergency warning signs, or if you think it is an emergency. Avoid public  transportation, ride-sharing, or taxis if possible. Get tested If you have symptoms of COVID-19, get tested. While waiting for test results, stay away from others, including staying apart from those living in your household. Get tested as soon as possible after your symptoms start. Treatments may be available for people with COVID-19 who are at risk for becoming very sick. Don't delay: Treatment must be started early to be effective--some treatments must begin within 5 days of your first symptoms. Contact your healthcare provider right away if your test result is positive to determine if you are eligible. Self-tests are one of several options for testing for the virus that causes COVID-19 and may be more convenient than laboratory-based tests and point-of-care tests. Ask your healthcare provider or your local health department if you need help interpreting  your test results. You can visit your state, tribal, local, and territorial health department's website to look for the latest local information on testing sites. Separate yourself from other people As much as possible, stay in a specific room and away from other people and pets in your home. If possible, you should use a separate bathroom. If you need to be around other people or animals in or outside of the home, wear a well-fitting mask. Tell your close contacts that they may have been exposed to COVID-19. An infected person can spread COVID-19 starting 48 hours (or 2 days) before the person has any symptoms or tests positive. By letting your close contacts know they may have been exposed to COVID-19, you are helping to protect everyone. See COVID-19 and Animals if you have questions about pets. If you are diagnosed with COVID-19, someone from the health department may call you. Answer the call to slow the spread. Monitor your symptoms Symptoms of COVID-19 include fever, cough, or other symptoms. Follow care instructions from your healthcare  provider and local health department. Your local health authorities may give instructions on checking your symptoms and reporting information. When to seek emergency medical attention Look for emergency warning signs* for COVID-19. If someone is showing any of these signs, seek emergency medical care immediately: Trouble breathing Persistent pain or pressure in the chest New confusion Inability to wake or stay awake Pale, gray, or blue-colored skin, lips, or nail beds, depending on skin tone *This list is not all possible symptoms. Please call your medical provider for any other symptoms that are severe or concerning to you. Call 911 or call ahead to your local emergency facility: Notify the operator that you are seeking care for someone who has or may have COVID-19. Call ahead before visiting your doctor Call ahead. Many medical visits for routine care are being postponed or done by phone or telemedicine. If you have a medical appointment that cannot be postponed, call your doctor's office, and tell them you have or may have COVID-19. This will help the office protect themselves and other patients. If you are sick, wear a well-fitting mask You should wear a mask if you must be around other people or animals, including pets (even at home). Wear a mask with the best fit, protection, and comfort for you. You don't need to wear the mask if you are alone. If you can't put on a mask (because of trouble breathing, for example), cover your coughs and sneezes in some other way. Try to stay at least 6 feet away from other people. This will help protect the people around you. Masks should not be placed on young children under age 39 years, anyone who has trouble breathing, or anyone who is not able to remove the mask without help. Cover your coughs and sneezes Cover your mouth and nose with a tissue when you cough or sneeze. Throw away used tissues in a lined trash can. Immediately wash your hands with  soap and water for at least 20 seconds. If soap and water are not available, clean your hands with an alcohol-based hand sanitizer that contains at least 60% alcohol. Clean your hands often Wash your hands often with soap and water for at least 20 seconds. This is especially important after blowing your nose, coughing, or sneezing; going to the bathroom; and before eating or preparing food. Use hand sanitizer if soap and water are not available. Use an alcohol-based hand sanitizer with at least 60% alcohol, covering  all surfaces of your hands and rubbing them together until they feel dry. Soap and water are the best option, especially if hands are visibly dirty. Avoid touching your eyes, nose, and mouth with unwashed hands. Handwashing Tips Avoid sharing personal household items Do not share dishes, drinking glasses, cups, eating utensils, towels, or bedding with other people in your home. Wash these items thoroughly after using them with soap and water or put in the dishwasher. Clean surfaces in your home regularly Clean and disinfect high-touch surfaces (for example, doorknobs, tables, handles, light switches, and countertops) in your "sick room" and bathroom. In shared spaces, you should clean and disinfect surfaces and items after each use by the person who is ill. If you are sick and cannot clean, a caregiver or other person should only clean and disinfect the area around you (such as your bedroom and bathroom) on an as needed basis. Your caregiver/other person should wait as long as possible (at least several hours) and wear a mask before entering, cleaning, and disinfecting shared spaces that you use. Clean and disinfect areas that may have blood, stool, or body fluids on them. Use household cleaners and disinfectants. Clean visible dirty surfaces with household cleaners containing soap or detergent. Then, use a household disinfectant. Use a product from [employer] List N: Disinfectants for  Coronavirus (ONGEX-52). Be sure to follow the instructions on the label to ensure safe and effective use of the product. Many products recommend keeping the surface wet with a disinfectant for a certain period of time (look at "contact time" on the product label). You may also need to wear personal protective equipment, such as gloves, depending on the directions on the product label. Immediately after disinfecting, wash your hands with soap and water for 20 seconds. For completed guidance on cleaning and disinfecting your home, visit Complete Disinfection Guidance. Take steps to improve ventilation at home Improve ventilation (air flow) at home to help prevent from spreading COVID-19 to other people in your household. Clear out COVID-19 virus particles in the air by opening windows, using air filters, and turning on fans in your home. Use this interactive tool to learn how to improve air flow in your home. When you can be around others after being sick with COVID-19 Deciding when you can be around others is different for different situations. Find out when you can safely end home isolation. For any additional questions about your care, contact your healthcare provider or state or local health department. 06/20/2020 Content source: [facility] ([facility]), Division of Viral Diseases This information is not intended to replace advice given to you by your health care provider. Make sure you discuss any questions you have with your health care provider. Document Revised: 08/03/2020 Document Reviewed: 08/03/2020 Elsevier Patient Education  2022 [employer].   If you have been instructed to have an in-person evaluation today at a local Urgent Care facility, please use the link below. It will take you to a list of all of our available [facility], including address, phone number and hours of operation. Please do not delay care.  [facility]  If you or a family member do not have a primary care provider, use the link below to schedule a visit and establish care. When you choose a [facility] primary care physician or advanced practice provider, you gain a long-term partner in health. Find a Primary Care Provider  Learn more about [facility] in-office and  virtual care options: [facility]

## 2022-05-09 NOTE — Addendum Note (Signed)
Addended by: Brunetta Jeans on: 05/09/2022 03:24 PM   Modules accepted: Orders

## 2022-05-09 NOTE — Addendum Note (Signed)
Addended by: Sanda Linger on: 05/09/2022 08:55 AM   Modules accepted: Orders

## 2022-05-09 NOTE — Progress Notes (Signed)
Virtual Visit Consent   Kathleen Jordan, you are scheduled for a virtual visit with a [facility] provider today. Just as with appointments in the office, your consent must be obtained to participate. Your consent will be active for this visit and any virtual visit you may have with one of our providers in the next 365 days. If you have a MyChart account, a copy of this consent can be sent to you electronically.  As this is a virtual visit, video technology does not allow for your provider to perform a traditional examination. This may limit your provider's ability to fully assess your condition. If your provider identifies any concerns that need to be evaluated in person or the need to arrange testing (such as labs, EKG, etc.), we will make arrangements to do so. Although advances in technology are sophisticated, we cannot ensure that it will always work on either your end or our end. If the connection with a video visit is poor, the visit may have to be switched to a telephone visit. With either a video or telephone visit, we are not always able to ensure that we have a secure connection.  By engaging in this virtual visit, you consent to the provision of healthcare and authorize for your insurance to be billed (if applicable) for the services provided during this visit. Depending on your insurance coverage, you may receive a charge related to this service.  I need to obtain your verbal consent now. Are you willing to proceed with your visit today? Kathleen Jordan has provided verbal consent on 05/09/2022 for a virtual visit (video or telephone). Leeanne Rio, [place]  Date: 05/09/2022 9:14 AM  Virtual Visit via Video Note   I, Leeanne Rio, connected with  Kathleen Jordan  ([id number], Apr 14, 1970) on 05/09/22 at  9:15 AM EST by a video-enabled telemedicine application and verified that I am speaking with the correct person using two  identifiers.  Location: Patient: Virtual Visit Location Patient: Home Provider: Virtual Visit Location Provider: Home Office   I discussed the limitations of evaluation and management by telemedicine and the availability of in person appointments. The patient expressed understanding and agreed to proceed.    History of Present Illness: Kathleen Jordan is a 52 y.o. who identifies as a female who was assigned female at birth, and is being seen today for COVID-19. Endorses symptoms starting early Monday morning with scratchy throat. Later that day, starting with congestion, cough. As of yesterday, noted loss of taste or smell. Denies fever, chills. Denies chest pain. Does note some windedness with exertion today. Tested at home this morning and was positive.   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   Dysfunction of eustachian tube 11/22/2019   Epigastric pain 08/16/2019   Other chest pain 08/16/2019   Pain of left calf 08/16/2019   RUQ pain 08/16/2019   Body aches 05/12/2019   History of frequent URI 05/12/2019   Acute non-recurrent pansinusitis 03/12/2019   Perennial allergic rhinitis 01/22/2019   Adverse food reaction 01/22/2019   Shortness of breath 01/22/2019   Allergic conjunctivitis of both eyes 01/22/2019   Hyperlipidemia associated with type 2 diabetes  mellitus ([place]) 06/13/2018   Menorrhagia 08/13/2017   Simple endometrial hyperplasia without atypia 08/13/2017   Gastroesophageal reflux disease 07/24/2017   Rectal bleeding 04/02/2017   Anal fissure 04/02/2017   Constipation 04/02/2017   Vaginal itching 01/31/2017   Preventative health care 06/20/2016   IBS  (irritable bowel syndrome) 09/07/2015   Knee pain, acute 06/01/2014   Obesity (BMI 30-39.9) 06/15/2013   Postpartum care following vaginal delivery (8/15) 11/13/2012   Frequent sinus infections 07/02/2011   OSA (obstructive sleep apnea) 12/06/2010   IDDM (insulin dependent diabetes mellitus) ([place]) 11/23/2010   HEADACHE 12/08/2009   POLYCYSTIC OVARIES 08/02/2009   MORBID OBESITY 06/29/2009   GOITER, SIMPLE 01/10/2009   DYSPHAGIA UNSPECIFIED 01/10/2009   ABSCESS, SKIN 11/10/2007   Hypertension 05/21/2007   SINUSITIS- ACUTE-NOS 05/21/2007   Essential hypertension 09/18/2006    Allergies: No Known Allergies Medications:  Current Outpatient Medications:    cefdinir (OMNICEF) 300 MG capsule, Take 1 capsule (300 mg total) by mouth 2 (two) times daily., Disp: 20 capsule, Rfl: 0   cetirizine (ZYRTEC ALLERGY) 10 MG tablet, Take 1 tablet (10 mg total) by mouth daily., Disp: 30 tablet, Rfl: 11   cyclobenzaprine (FLEXERIL) 10 MG tablet, Take 1 tablet (10 mg total) by mouth 3 (three) times daily as needed for muscle spasms., Disp: 30 tablet, Rfl: 1   dicyclomine (BENTYL) 20 MG tablet, Take 20 mg by mouth every 6 (six) hours., Disp: , Rfl:    empagliflozin (JARDIANCE) 25 MG TABS tablet, Take 25 mg by mouth every evening. , Disp: , Rfl:    EPINEPHrine (AUVI-Q) 0.3 mg/0.3 mL IJ SOAJ injection, Inject 0.3 mLs (0.3 mg total) into the muscle as needed for anaphylaxis., Disp: 1 each, Rfl: 1   ezetimibe (ZETIA) 10 MG tablet, TAKE 1 TABLET(10 MG) BY MOUTH DAILY, Disp: 90 tablet, Rfl: 1   glimepiride (AMARYL) 1 MG tablet, Take by mouth., Disp: , Rfl:    glucose blood (CONTOUR NEXT TEST) test strip, Check BG 3 times/day.  Dx E11.65, Disp: , Rfl:    meloxicam (MOBIC) 15 MG tablet, TAKE 1/2 TO 1 TABLET BY MOUTH EVERY DAY AS NEEDED, Disp: 30 tablet, Rfl: 0   methocarbamol (ROBAXIN) 500 MG tablet, Take 1 tablet (500 mg total) by mouth 3 (three) times daily., Disp: 60 tablet, Rfl: 0   NIFEdipine (PROCARDIA  XL/NIFEDICAL XL) 60 MG 24 hr tablet, Take 1 tablet by mouth daily, Disp: 90 tablet, Rfl: 1   rosuvastatin (CRESTOR) 10 MG tablet, TAKE 1 TABLET(10 MG) BY MOUTH AT BEDTIME, Disp: 90 tablet, Rfl: 1   spironolactone (ALDACTONE) 25 MG tablet, TAKE 1 TABLET(25 MG) BY MOUTH DAILY, Disp: 90 tablet, Rfl: 1   tiZANidine (ZANAFLEX) 4 MG tablet, Take 1 tablet (4 mg total) by mouth every 8 (eight) hours as needed for muscle spasms., Disp: 40 tablet, Rfl: 0   TRESIBA FLEXTOUCH 200 UNIT/ML FlexTouch Pen, Inject into the skin., Disp: , Rfl:    TRULICITY 3 ZO/1.0RU SOPN, Inject into the skin., Disp: , Rfl:   Observations/Objective: Patient is well-developed, well-nourished in no acute distress.  Resting comfortably at home.  Head is normocephalic, atraumatic.  No labored breathing. Speech is clear and coherent with logical content.  Patient is alert and oriented at baseline.   Assessment and Plan: 1. COVID-19  Patient with multiple risk factors for complicated course of illness. Discussed risks/benefits of antiviral medications including most common potential ADRs. Patient voiced understanding and would like to proceed with antiviral medication. They are candidate for  Molnupiravir. Rx sent to pharmacy. Supportive measures, OTC medications and vitamin regimen reviewed. Albuterol and Tessalon. Patient has been enrolled in a MyChart COVID symptom monitoring program. Samule Dry reviewed in detail. Strict ER precautions discussed with patient.    Follow Up Instructions: I discussed the assessment and treatment plan with the patient. The patient was provided an opportunity to ask questions and all were answered. The patient agreed with the plan and demonstrated an understanding of the instructions.  A copy of instructions were sent to the patient via MyChart unless otherwise noted below.   The patient was advised to call back or seek an in-person evaluation if the symptoms worsen or if the condition fails to  improve as anticipated.  Time:  I spent 10 minutes with the patient via telehealth technology discussing the above problems/concerns.    Leeanne Rio, PA-C

## 2022-05-13 ENCOUNTER — Encounter: Payer: Self-pay | Admitting: Physician Assistant

## 2022-09-03 ENCOUNTER — Encounter: Payer: Self-pay | Admitting: *Deleted

## 2022-09-03 ENCOUNTER — Other Ambulatory Visit: Payer: Self-pay | Admitting: *Deleted

## 2022-09-03 DIAGNOSIS — Z8742 Personal history of other diseases of the female genital tract: Secondary | ICD-10-CM

## 2022-09-03 MED ORDER — SPIRONOLACTONE 25 MG PO TABS
ORAL_TABLET | ORAL | 0 refills | Status: DC
Start: 2022-09-03 — End: 2022-12-19

## 2022-09-23 ENCOUNTER — Encounter: Payer: Self-pay | Admitting: *Deleted

## 2022-11-07 IMAGING — DX DG LUMBAR SPINE COMPLETE 4+V
5 series · 5 of 5 positions shown · non-contrast
Comparison: None.

CLINICAL DATA: Low back pain with radiation down right leg.

EXAM:
LUMBAR SPINE - COMPLETE 4+ VIEW

[l-spine ap]
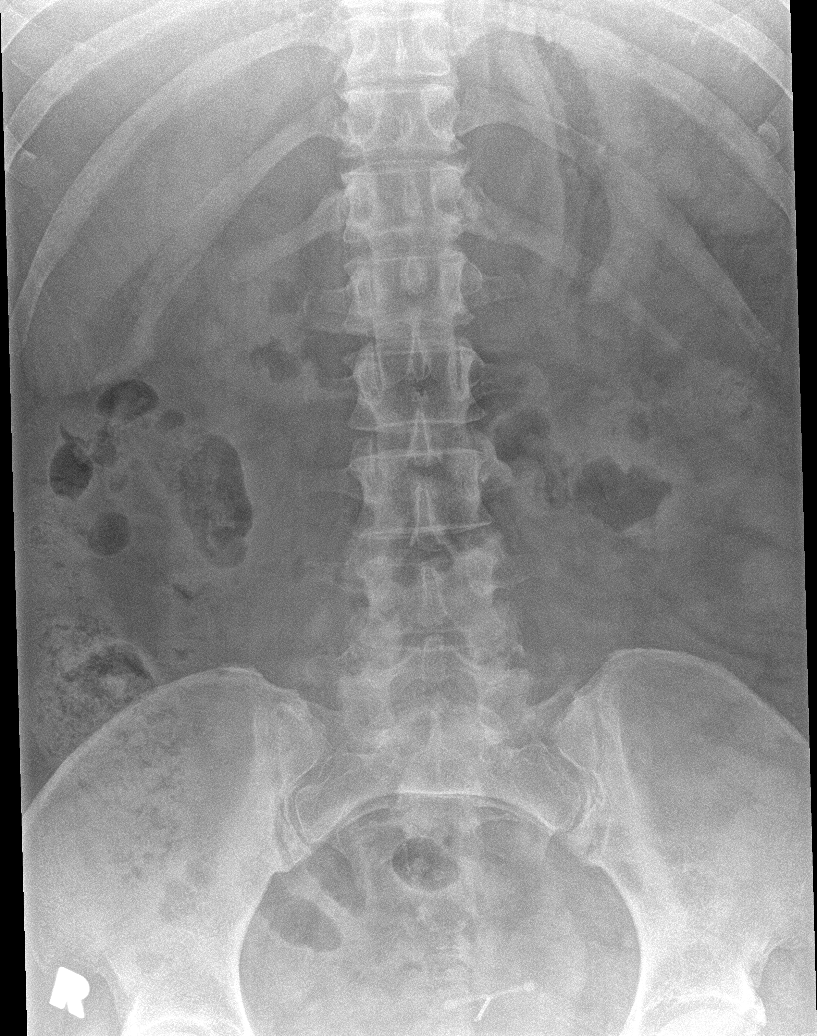

[l-spine obl (1 of 2)]
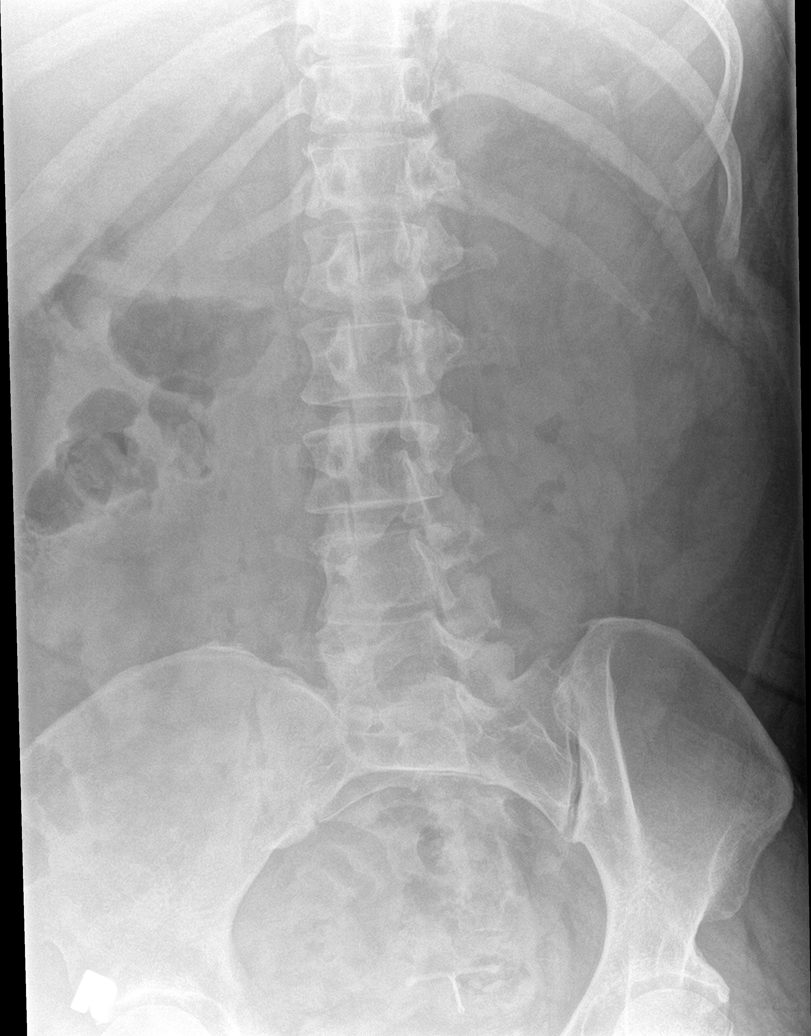

[l-spine obl (2 of 2)]
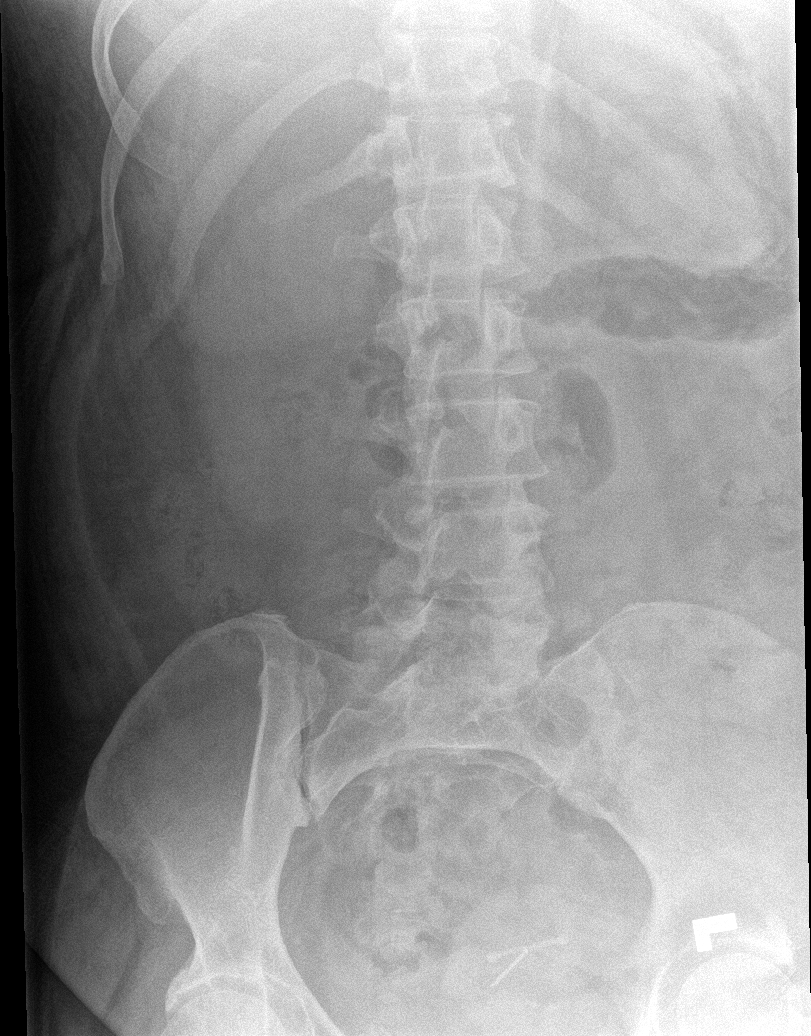

[l-spine lat]
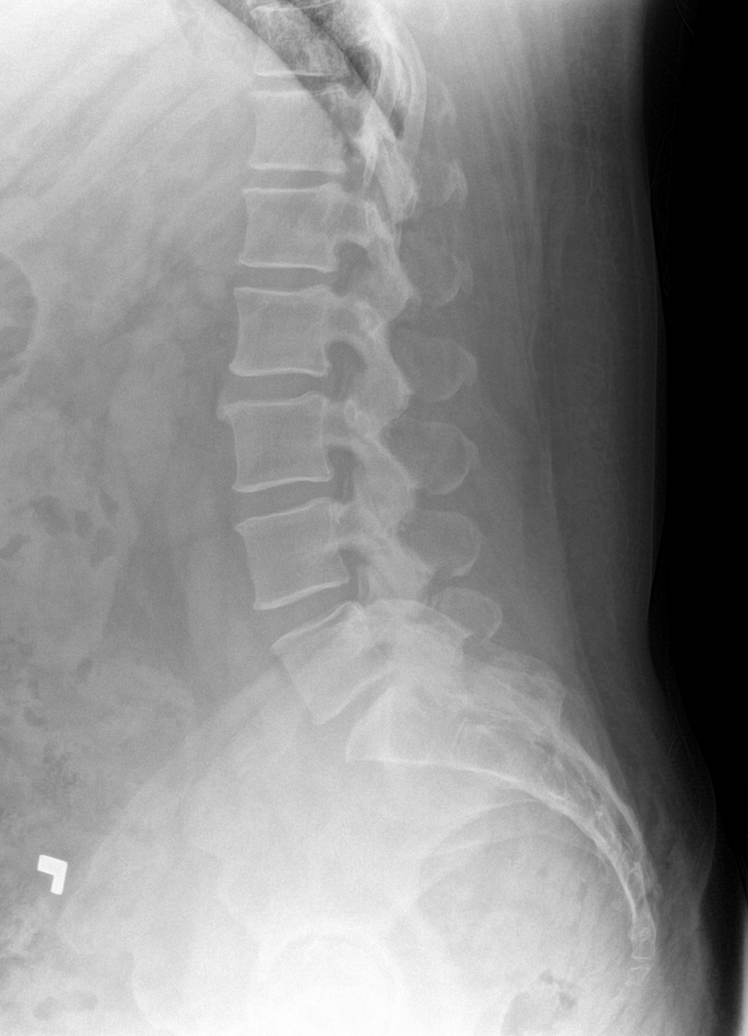

[l-spine spot]
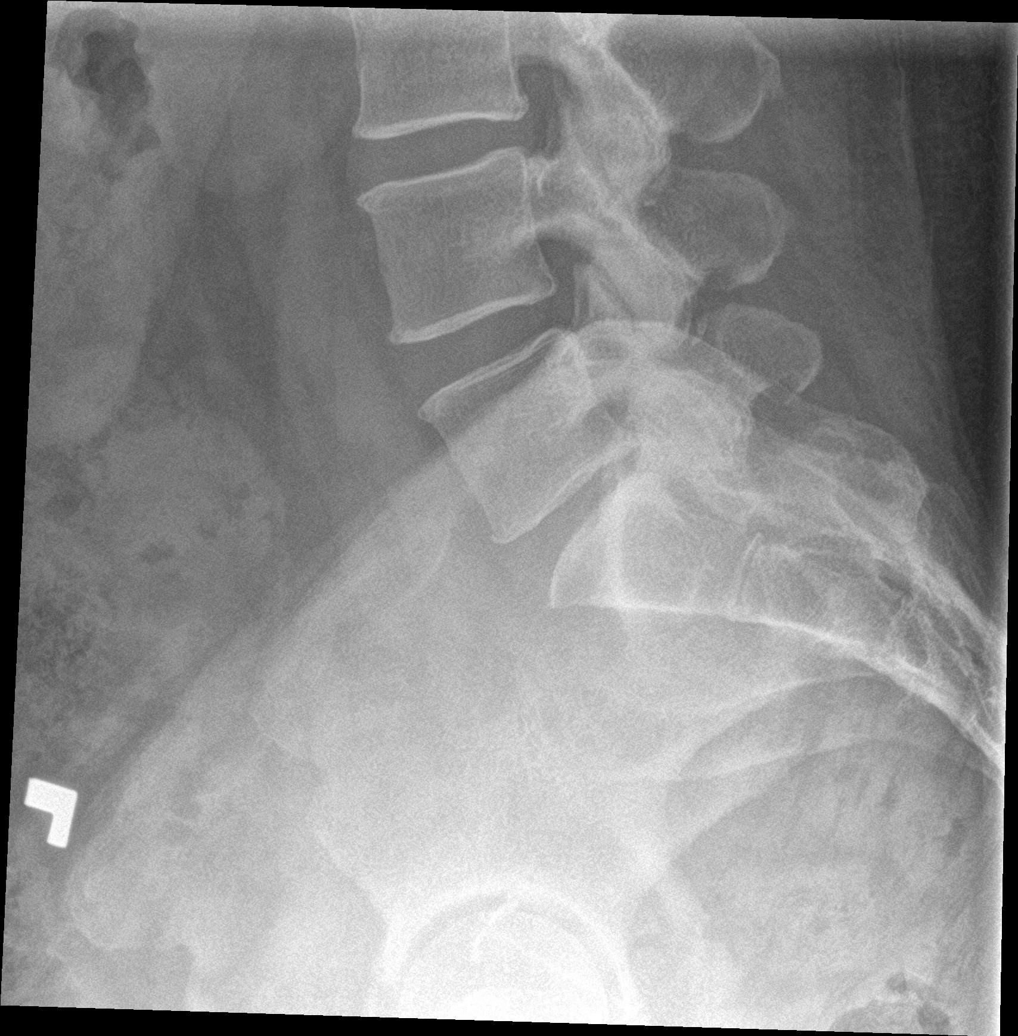

[5 of 5 positions shown; findings below may reference images not displayed]

FINDINGS: Negative for pars defect. Symmetric appearance of the SI joints.
There is an IUD in the pelvis. Vertebral body heights and disc
spaces are maintained. Mild degenerative endplate changes. Normal
alignment.
IMPRESSION: No acute abnormality.

## 2022-11-07 IMAGING — DX DG HIP (WITH OR WITHOUT PELVIS) 2-3V*R*
3 series · 3 of 3 positions shown · non-contrast
Comparison: 06/15/2021

CLINICAL DATA: Right hip and low back pain

EXAM:
DG HIP (WITH OR WITHOUT PELVIS) 2-3V RIGHT

[pelvis ap]
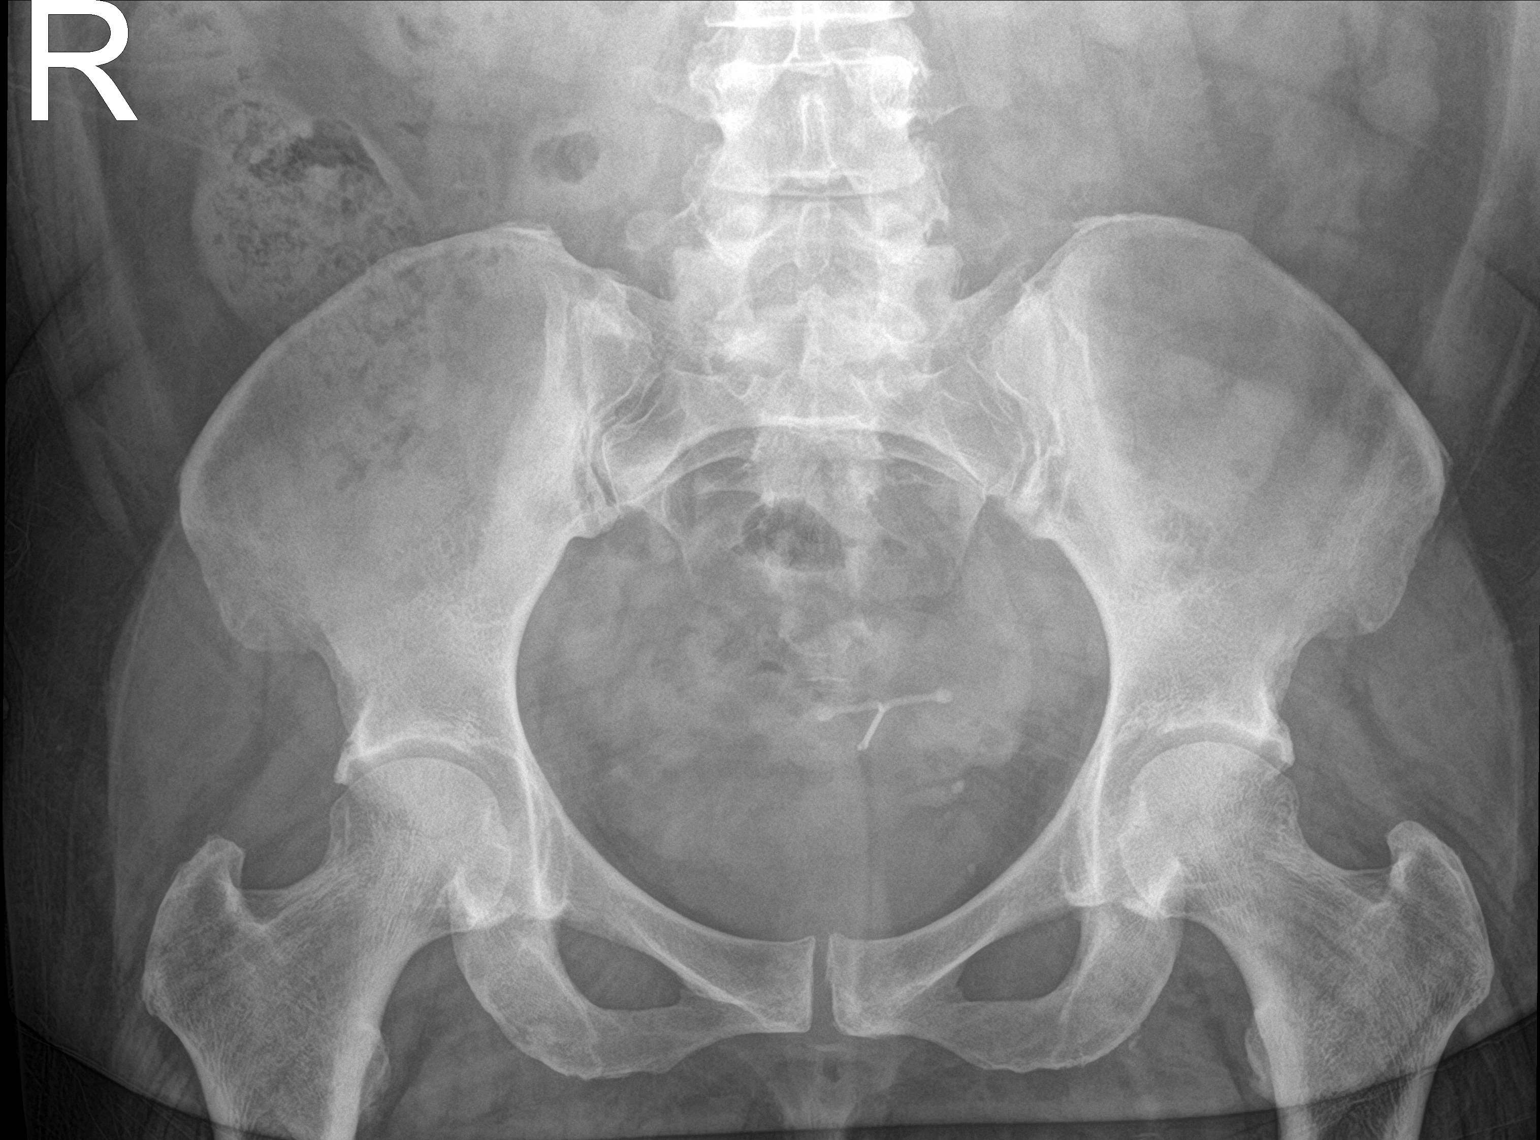

[hip ap]
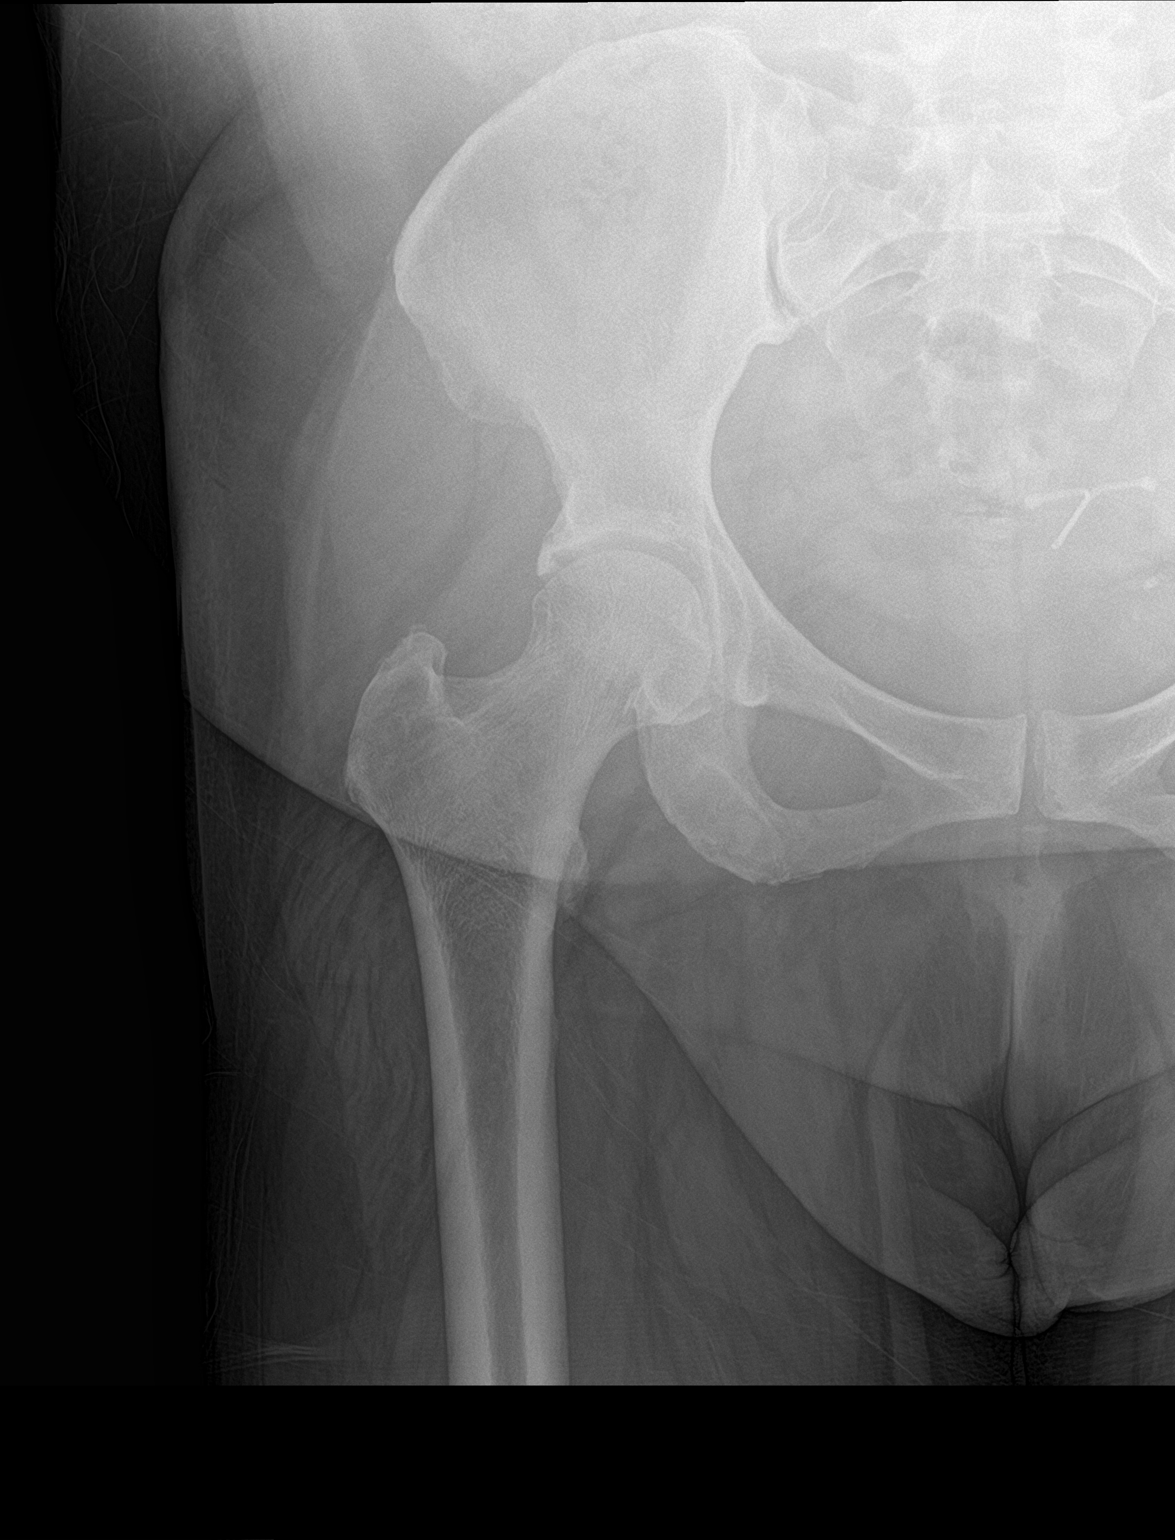

[hip lat]
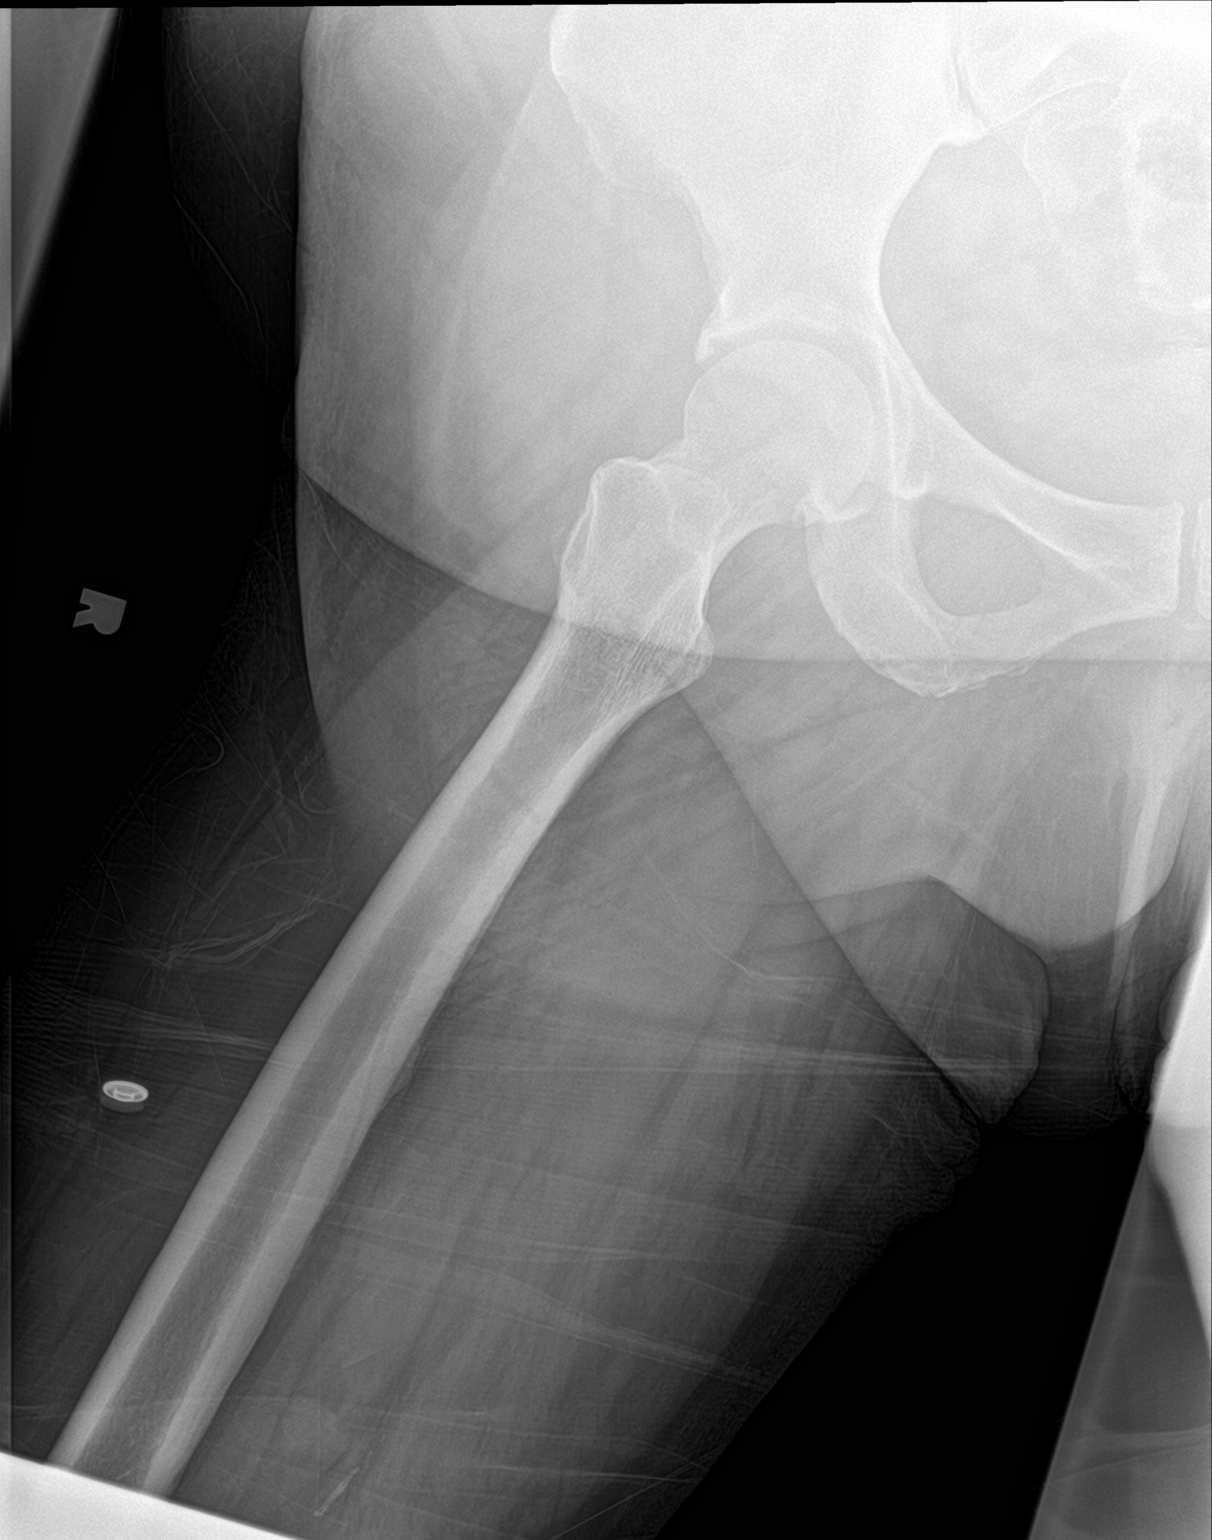

[3 of 3 positions shown; findings below may reference images not displayed]

FINDINGS: Bony pelvis and hips appear symmetric and intact. No acute osseous
finding or fracture. No subluxation or dislocation. No significant
arthropathy. IUD noted in the pelvic midline. Nonobstructive bowel
gas pattern.
IMPRESSION: No acute osseous finding.

## 2022-11-29 ENCOUNTER — Ambulatory Visit: Payer: BC Managed Care – PPO | Admitting: Family Medicine

## 2022-11-29 ENCOUNTER — Encounter: Payer: Self-pay | Admitting: Family Medicine

## 2022-11-29 VITALS — BP 110/70 | HR 93 | Temp 97.9°F | Resp 18 | Ht 68.0 in | Wt 236.6 lb

## 2022-11-29 DIAGNOSIS — I10 Essential (primary) hypertension: Secondary | ICD-10-CM

## 2022-11-29 DIAGNOSIS — K5901 Slow transit constipation: Secondary | ICD-10-CM

## 2022-11-29 MED ORDER — NIFEDIPINE ER OSMOTIC RELEASE 60 MG PO TB24
ORAL_TABLET | ORAL | 1 refills | Status: DC
Start: 2022-11-29 — End: 2023-05-30

## 2022-11-29 NOTE — Assessment & Plan Note (Signed)
Con't fiber and miralax per GI Work note written

## 2022-11-29 NOTE — Progress Notes (Signed)
++````  Established Patient Office Visit  Subjective   Patient ID: Kathleen Jordan, female    DOB: 1970-08-09  Age: 52 y.o. MRN: [id number]  Chief Complaint  Patient presents with   Abdominal Pain    Pt states sxs have been going on for some time. Pt states having colonoscopy in 2 weeks.     HPI Discussed the use of AI scribe software for clinical note transcription with the patient, who gave verbal consent to proceed.  History of Present Illness   The patient, with a history of IBS, presents with constipation. She reports an urge to defecate but is unable to pass stool. She has tried increasing their intake of dairy products, which usually stimulates bowel movements, but this has not been effective. She has also been alternating between Benafiber and Metamucil as per their gastroenterologist's advice, but this has not alleviated her symptoms. The patient is scheduled for a colonoscopy in a couple of weeks. She also reports that she has been late to work in the mornings due to her constipation and associated abdominal cramping. She has been drinking a lot of water to try and alleviate her symptoms. 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      Patient Active Problem List   Diagnosis Date Noted   Multiple joint pain 06/07/2021   Fall 06/03/2021   Uncontrolled type 2 diabetes mellitus with hyperglycemia (HCC) 02/07/2021   Close exposure to COVID-19 virus 09/19/2020   Seborrheic dermatitis of scalp 11/28/2019   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   Dysfunction of eustachian tube 11/22/2019   Epigastric pain 08/16/2019   Other chest pain 08/16/2019   Pain of left calf 08/16/2019   RUQ pain 08/16/2019   Body aches 05/12/2019   History of frequent URI 05/12/2019   Acute non-recurrent pansinusitis 03/12/2019   Perennial allergic rhinitis 01/22/2019   Adverse food reaction 01/22/2019    Shortness of breath 01/22/2019   Allergic conjunctivitis of both eyes 01/22/2019   Hyperlipidemia associated with type 2 diabetes mellitus (HCC) 06/13/2018   Menorrhagia 08/13/2017   Simple endometrial hyperplasia without atypia 08/13/2017   Gastroesophageal reflux disease 07/24/2017   Rectal bleeding 04/02/2017   Anal fissure 04/02/2017   Constipation 04/02/2017   Vaginal itching 01/31/2017   Preventative health care 06/20/2016   IBS (irritable bowel syndrome) 09/07/2015   Knee pain, acute 06/01/2014   Obesity (BMI 30-39.9) 06/15/2013   Postpartum care following vaginal delivery (8/15) 11/13/2012   Frequent sinus infections 07/02/2011   OSA (obstructive sleep apnea) 12/06/2010   IDDM (insulin dependent diabetes mellitus) (HCC) 11/23/2010   HEADACHE 12/08/2009   POLYCYSTIC OVARIES 08/02/2009   MORBID OBESITY 06/29/2009   GOITER, SIMPLE 01/10/2009   DYSPHAGIA UNSPECIFIED 01/10/2009   ABSCESS, SKIN 11/10/2007   Hypertension 05/21/2007   SINUSITIS- ACUTE-NOS 05/21/2007   Essential hypertension 09/18/2006   Past Medical History:  Diagnosis Date   Allergic rhinitis    Anal fissure 07/15/2017   Chronic constipation    Frequency of urination    GERD (gastroesophageal reflux disease)    History of palpitations    Hx of varicella    Hyperlipidemia    Hypertension    Menorrhagia    OSA (obstructive sleep apnea)    08-07-2017 no cpap due to finanaces   PCOS (polycystic ovarian syndrome)    Peripheral neuropathy    Thyroid goiter    Type 2  diabetes mellitus with hyperglycemia, with long-term current use of insulin [facility])    Urgency of urination    Past Surgical History:  Procedure Laterality Date   BREAST REDUCTION SURGERY Bilateral 07-18-2003   dr Shon Hough  [facility]   CHOLECYSTECTOMY N/A 02/15/2019   Procedure: LAPAROSCOPIC CHOLECYSTECTOMY;  Surgeon: Axel Filler, MD;  Location: [facility] OR;  Service: General;  Laterality: N/A;   COLONOSCOPY WITH PROPOFOL N/A 07/15/2017    Procedure: COLONOSCOPY WITH PROPOFOL;  Surgeon: Hilarie Fredrickson, MD;  Location: [facility];  Service: Endoscopy;  Laterality: N/A;   DILITATION & CURRETTAGE/HYSTROSCOPY WITH HYDROTHERMAL ABLATION N/A 03/06/2018   Procedure: DILATATION & CURETTAGE/HYSTEROSCOPY WITH HYDROTHERMAL ABLATION, INSERTION OF INTRAUTERINE DEVICE;  Surgeon: Hal Morales, MD;  Location: [facility];  Service: Gynecology;  Laterality: N/A;   invitro fertilization  11/16/2010   [facility]   Social History   Tobacco Use   Smoking status: Never    Passive exposure: Yes   Smokeless tobacco: Never   Tobacco comments:    spouse smokes outside the home  Vaping Use   Vaping status: Never Used  Substance Use Topics   Alcohol use: Yes    Comment: social   Drug use: No   Social History   Socioeconomic History   Marital status: Married    Spouse name: Caryn Bee   Number of children: 1   Years of education: Masters   Highest education level: Not on file  Occupational History   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[place]    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 [place]  Tobacco Use   Smoking status: Never    Passive exposure: Yes   Smokeless tobacco: Never   Tobacco comments:    spouse smokes outside the home  Vaping Use   Vaping status: Never Used  Substance and Sexual Activity   Alcohol use: Yes    Comment: social   Drug use: No   Sexual activity: Yes    Partners: Male    Birth control/protection: None  Other Topics Concern   Not on file  Social History Narrative   Drinks caffeine 2x a week    Social Determinants of Health   Financial Resource Strain: Not on file  Food Insecurity: Low Risk  (11/15/2022)   Received from [facility]   Hunger Vital Sign    Worried About Running Out of Food in the Last Year: Never true    Ran Out of Food in the Last Year: Never true  Transportation Needs: Not on file (11/15/2022)  Physical Activity: Not on file  Stress: Not on file   Social Connections: Not on file  Intimate Partner Violence: Not on file   Family Status  Relation Name Status   Mother  Alive   Sister  Alive   Brother  Alive   MGM  Alive   MGF  Deceased at age 15   Sister  Alive   Brother  Armed forces training and education officer   Sister  (Not Specified)  No partnership data on file   Family History  Problem Relation Age of Onset   Arthritis Mother    Diabetes Mother    Hypertension Mother    Heart disease Mother 10       MI   Stroke Mother    Congestive Heart Failure Mother    Allergic rhinitis Mother    Asthma Mother    Allergic rhinitis Sister    Diabetes Brother    Hypertension Brother  Arthritis Maternal Grandmother    Diabetes Maternal Grandmother    Hypertension Maternal Grandmother    Cancer Maternal Grandmother    Stroke Maternal Grandfather    Cancer Maternal Grandfather    Asthma Sister    Allergies Sister    No Known Allergies    ROS    Objective:     BP 110/70 (BP Location: Left Arm, Patient Position: Sitting, Cuff Size: Normal)   Pulse 93   Temp 97.9 F (36.6 C) (Oral)   Resp 18   Ht 5\' 8"  (1.727 m)   Wt 236 lb 9.6 oz (107.3 kg)   SpO2 97%   BMI 35.97 kg/m  BP Readings from Last 3 Encounters:  11/29/22 110/70  02/05/22 121/72  01/22/22 104/80   Wt Readings from Last 3 Encounters:  11/29/22 236 lb 9.6 oz (107.3 kg)  01/22/22 242 lb (109.8 kg)  10/24/21 235 lb (106.6 kg)   SpO2 Readings from Last 3 Encounters:  11/29/22 97%  01/22/22 98%  10/24/21 97%      Physical Exam Vitals and nursing note reviewed.  Constitutional:      General: She is not in acute distress.    Appearance: Normal appearance. She is well-developed.  HENT:     Head: Normocephalic and atraumatic.  Eyes:     General: No scleral icterus.       Right eye: No discharge.        Left eye: No discharge.  Cardiovascular:     Rate and Rhythm: Normal rate and regular rhythm.     Heart sounds: No murmur heard. Pulmonary:     Effort: Pulmonary effort is  normal. No respiratory distress.     Breath sounds: Normal breath sounds.  Musculoskeletal:        General: Normal range of motion.     Cervical back: Normal range of motion and neck supple.     Right lower leg: No edema.     Left lower leg: No edema.  Skin:    General: Skin is warm and dry.  Neurological:     Mental Status: She is alert and oriented to person, place, and time.  Psychiatric:        Mood and Affect: Mood normal.        Behavior: Behavior normal.        Thought Content: Thought content normal.        Judgment: Judgment normal.      No results found for any visits on 11/29/22.  Last CBC Lab Results  Component Value Date   WBC 8.6 06/12/2021   HGB 14.9 06/12/2021   HCT 43 06/12/2021   MCV 88.6 02/18/2020   MCH 29.5 11/26/2019   RDW 13.1 02/18/2020   PLT 279 06/12/2021   Last metabolic panel Lab Results  Component Value Date   GLUCOSE 169 (H) 02/07/2021   NA 139 06/12/2021   K 4.0 06/12/2021   CL 105 06/12/2021   CO2 25 (A) 06/12/2021   BUN 17 06/12/2021   CREATININE 0.9 06/12/2021   EGFR 78 06/12/2021   CALCIUM 9.9 06/12/2021   PROT 7.3 02/07/2021   ALBUMIN 4.4 06/12/2021   BILITOT 0.4 02/07/2021   ALKPHOS 83 06/12/2021   AST 38 (A) 06/12/2021   ALT 57 (A) 06/12/2021   ANIONGAP 11 02/10/2019   Last lipids Lab Results  Component Value Date   CHOL 200 02/18/2020   HDL 44.30 02/18/2020   LDLCALC 139 (H) 02/18/2020   LDLDIRECT 139.3 11/20/2010  TRIG 82.0 02/18/2020   CHOLHDL 5 02/18/2020   Last hemoglobin A1c Lab Results  Component Value Date   HGBA1C 13.0 (H) 02/07/2021   Last thyroid functions Lab Results  Component Value Date   TSH 1.89 02/18/2020   T4TOTAL 8.4 11/26/2019   Last vitamin D Lab Results  Component Value Date   VD25OH 16.10 (L) 09/07/2015   Last vitamin B12 and Folate No results found for: "VITAMINB12", "FOLATE"    The 10-year ASCVD risk score (Arnett DK, et al., 2019) is: 5.9%    Assessment & Plan:    Problem List Items Addressed This Visit       Unprioritized   Essential hypertension   Relevant Medications   NIFEdipine (PROCARDIA XL/NIFEDICAL XL) 60 MG 24 hr tablet   Constipation - Primary    Con't fiber and miralax per GI Work note written      Assessment and Plan    Constipation Patient reports constipation and abdominal cramping despite use of Benafiber and Metamucil. Patient is scheduled for a colonoscopy with a gastroenterologist. -Continue Benafiber, Metamucil, and add Miralax in the evening. -Consider dietary changes such as adding prunes or prune juice.  Work Excuse Patient requested a work excuse for the day of the visit. -Provide work excuse for 11/29/2022, with return to work on 12/03/2022.  Hypertension Patient requested a refill of her blood pressure medication. -Refill blood pressure medication (name not specified in conversation) to [facility].        No follow-ups on file.    [name], DO

## 2022-12-18 ENCOUNTER — Encounter (HOSPITAL_BASED_OUTPATIENT_CLINIC_OR_DEPARTMENT_OTHER): Payer: Self-pay | Admitting: Emergency Medicine

## 2022-12-18 ENCOUNTER — Emergency Department (HOSPITAL_BASED_OUTPATIENT_CLINIC_OR_DEPARTMENT_OTHER)
Admission: EM | Admit: 2022-12-18 | Discharge: 2022-12-18 | Disposition: A | Discharge status: Home / Self Care | Payer: BC Managed Care – PPO | Attending: Emergency Medicine | Admitting: Emergency Medicine

## 2022-12-18 ENCOUNTER — Emergency Department (HOSPITAL_BASED_OUTPATIENT_CLINIC_OR_DEPARTMENT_OTHER): Payer: BC Managed Care – PPO

## 2022-12-18 ENCOUNTER — Other Ambulatory Visit: Payer: Self-pay

## 2022-12-18 DIAGNOSIS — M25512 Pain in left shoulder: Secondary | ICD-10-CM | POA: Insufficient documentation

## 2022-12-18 DIAGNOSIS — S20212A Contusion of left front wall of thorax, initial encounter: Secondary | ICD-10-CM | POA: Diagnosis not present

## 2022-12-18 DIAGNOSIS — M25532 Pain in left wrist: Secondary | ICD-10-CM | POA: Insufficient documentation

## 2022-12-18 DIAGNOSIS — S161XXA Strain of muscle, fascia and tendon at neck level, initial encounter: Secondary | ICD-10-CM | POA: Insufficient documentation

## 2022-12-18 DIAGNOSIS — Y92410 Unspecified street and highway as the place of occurrence of the external cause: Secondary | ICD-10-CM | POA: Insufficient documentation

## 2022-12-18 DIAGNOSIS — S199XXA Unspecified injury of neck, initial encounter: Secondary | ICD-10-CM | POA: Diagnosis present

## 2022-12-18 MED ORDER — HYDROCODONE-ACETAMINOPHEN 5-325 MG PO TABS: 1.0000 | ORAL_TABLET | Freq: Four times a day (QID) | ORAL | 0 refills | Status: DC | PRN

## 2022-12-18 NOTE — ED Notes (Signed)
Assumed care of patient. Patient ambulated to the room.
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
## 2022-12-19 ENCOUNTER — Other Ambulatory Visit: Payer: Self-pay | Admitting: Family Medicine

## 2022-12-19 DIAGNOSIS — Z8742 Personal history of other diseases of the female genital tract: Secondary | ICD-10-CM

## 2023-01-03 ENCOUNTER — Ambulatory Visit: Payer: BC Managed Care – PPO | Admitting: Sports Medicine

## 2023-01-03 DIAGNOSIS — M25532 Pain in left wrist: Secondary | ICD-10-CM | POA: Diagnosis not present

## 2023-01-03 DIAGNOSIS — S46812A Strain of other muscles, fascia and tendons at shoulder and upper arm level, left arm, initial encounter: Secondary | ICD-10-CM

## 2023-01-03 DIAGNOSIS — M542 Cervicalgia: Secondary | ICD-10-CM

## 2023-01-03 DIAGNOSIS — M25512 Pain in left shoulder: Secondary | ICD-10-CM | POA: Diagnosis not present

## 2023-01-03 MED ORDER — TIZANIDINE HCL 4 MG PO TABS: 4.0000 mg | ORAL_TABLET | Freq: Every evening | ORAL | 0 refills | Status: DC | PRN

## 2023-01-03 MED ORDER — MELOXICAM 15 MG PO TABS: 15.0000 mg | ORAL_TABLET | Freq: Every day | ORAL | 0 refills | Status: DC

## 2023-01-03 NOTE — Patient Instructions (Signed)
-   Start meloxicam 15 mg daily x2 weeks.  If still having pain after 2 weeks, complete 3rd-week of meloxicam. May use remaining meloxicam as needed once daily for pain control.  Do not to use additional NSAIDs while taking meloxicam.  May use Tylenol [phone] mg 2 to 3 times a day for breakthrough pain. Zanaflex 4-8 mg nightly Neck HEP  4-5 week follow up
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
## 2023-01-14 ENCOUNTER — Ambulatory Visit: Payer: BC Managed Care – PPO | Admitting: Family Medicine

## 2023-01-14 ENCOUNTER — Encounter: Payer: Self-pay | Admitting: Family Medicine

## 2023-01-14 VITALS — BP 110/70 | HR 78 | Temp 98.7°F | Resp 18 | Ht 68.0 in | Wt 237.4 lb

## 2023-01-14 DIAGNOSIS — N6321 Unspecified lump in the left breast, upper outer quadrant: Secondary | ICD-10-CM | POA: Insufficient documentation

## 2023-01-14 DIAGNOSIS — Z23 Encounter for immunization: Secondary | ICD-10-CM

## 2023-01-14 DIAGNOSIS — R0782 Intercostal pain: Secondary | ICD-10-CM | POA: Insufficient documentation
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
## 2023-01-14 NOTE — Assessment & Plan Note (Signed)
Suspect hematoma but will get diagnostic mammogram

## 2023-01-14 NOTE — Assessment & Plan Note (Signed)
Pt has muscle relaxer and meloxicam Heat/ ice  Return to office as needed
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
## 2023-02-07 ENCOUNTER — Ambulatory Visit: Payer: BC Managed Care – PPO | Admitting: Sports Medicine

## 2023-03-27 ENCOUNTER — Other Ambulatory Visit: Payer: BC Managed Care – PPO

## 2023-04-11 ENCOUNTER — Other Ambulatory Visit: Payer: Self-pay | Admitting: Sports Medicine

## 2023-04-28 ENCOUNTER — Encounter: Payer: Self-pay | Admitting: Emergency Medicine

## 2023-04-28 ENCOUNTER — Telehealth: Payer: 59 | Admitting: Emergency Medicine

## 2023-04-28 DIAGNOSIS — H1013 Acute atopic conjunctivitis, bilateral: Secondary | ICD-10-CM | POA: Diagnosis not present

## 2023-04-28 NOTE — Progress Notes (Signed)
Virtual Visit Consent   Kathleen Jordan, you are scheduled for a virtual visit with a [facility] provider today. Just as with appointments in the office, your consent must be obtained to participate. Your consent will be active for this visit and any virtual visit you may have with one of our providers in the next 365 days. If you have a MyChart account, a copy of this consent can be sent to you electronically.  As this is a virtual visit, video technology does not allow for your provider to perform a traditional examination. This may limit your provider's ability to fully assess your condition. If your provider identifies any concerns that need to be evaluated in person or the need to arrange testing (such as labs, EKG, etc.), we will make arrangements to do so. Although advances in technology are sophisticated, we cannot ensure that it will always work on either your end or our end. If the connection with a video visit is poor, the visit may have to be switched to a telephone visit. With either a video or telephone visit, we are not always able to ensure that we have a secure connection.  By engaging in this virtual visit, you consent to the provision of healthcare and authorize for your insurance to be billed (if applicable) for the services provided during this visit. Depending on your insurance coverage, you may receive a charge related to this service.  I need to obtain your verbal consent now. Are you willing to proceed with your visit today? [name] has provided verbal consent on 04/28/2023 for a virtual visit (video or telephone). Cathlyn Parsons, NP  Date: 04/28/2023 11:44 AM  Virtual Visit via Video Note   I, Cathlyn Parsons, connected with  Kathleen Jordan  ([id number], Apr 21, 1970) on 04/28/23 at 11:30 AM EST by a video-enabled telemedicine application and verified that I am speaking with the correct person using two identifiers.  Location: Patient:  Virtual Visit Location Patient: Home Provider: Virtual Visit Location Provider: Home Office   I discussed the limitations of evaluation and management by telemedicine and the availability of in person appointments. The patient expressed understanding and agreed to proceed.    History of Present Illness: Kathleen Jordan is a 53 y.o. who identifies as a female who was assigned female at birth, and is being seen today for itchy allergic eyes. Has for sefveral years now had these symptoms that include itchy eyes and blurry vision. Sees eye dr regularly for eye exams due to diabetes. Has seen eye dr for today's sx and was told she has dry eyes. Uses systane eye drops and ocusoft eyelid wipes. Tytpically this is enough to manage her symptoms but at this time of year, this plus zzyrtrec isn't enough to manage symptoms; this happens every year around this time. Sx are not new and have been evaluated by eye dr.   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   Dysfunction of eustachian tube 11/22/2019   Epigastric pain 08/16/2019   Other chest pain 08/16/2019   Pain of left calf 08/16/2019   RUQ  pain 08/16/2019   Body aches 05/12/2019   History of frequent URI 05/12/2019   Acute non-recurrent pansinusitis 03/12/2019   Perennial allergic rhinitis 01/22/2019   Adverse food reaction 01/22/2019   Shortness of breath 01/22/2019   Allergic conjunctivitis of both eyes 01/22/2019   Hyperlipidemia associated with type 2 diabetes mellitus (HCC) 06/13/2018   Menorrhagia 08/13/2017   Simple endometrial hyperplasia without atypia  08/13/2017   Gastroesophageal reflux disease 07/24/2017   Rectal bleeding 04/02/2017   Anal fissure 04/02/2017   Constipation 04/02/2017   Vaginal itching 01/31/2017   Preventative health care 06/20/2016   IBS (irritable bowel syndrome) 09/07/2015   Knee pain, acute 06/01/2014   Obesity (BMI 30-39.9) 06/15/2013   Postpartum care following vaginal delivery (8/15) 11/13/2012   Frequent sinus infections 07/02/2011   OSA (obstructive sleep apnea) 12/06/2010   IDDM (insulin dependent diabetes mellitus) (HCC) 11/23/2010   Headache 12/08/2009   POLYCYSTIC OVARIES 08/02/2009   MORBID OBESITY 06/29/2009   Simple goiter 01/10/2009   Dysphagia 01/10/2009   ABSCESS, SKIN 11/10/2007   Hypertension 05/21/2007   SINUSITIS- ACUTE-NOS 05/21/2007   Essential hypertension 09/18/2006    Allergies: No Known Allergies Medications:  Current Outpatient Medications:    albuterol (VENTOLIN HFA) 108 (90 Base) MCG/ACT inhaler, Inhale 2 puffs into the lungs every 6 (six) hours as needed for wheezing or shortness of breath., Disp: 8 g, Rfl: 0   cetirizine (ZYRTEC ALLERGY) 10 MG tablet, Take 1 tablet (10 mg total) by mouth daily., Disp: 30 tablet, Rfl: 11   cyclobenzaprine (FLEXERIL) 10 MG tablet, Take 1 tablet (10 mg total) by mouth 3 (three) times daily as needed for muscle spasms., Disp: 30 tablet, Rfl: 1   dicyclomine (BENTYL) 20 MG tablet, Take 20 mg by mouth every 6 (six) hours., Disp: , Rfl:    empagliflozin (JARDIANCE) 25 MG TABS tablet, Take 25 mg by mouth every evening. , Disp: , Rfl:    EPINEPHrine (AUVI-Q) 0.3 mg/0.3 mL IJ SOAJ injection, Inject 0.3 mLs (0.3 mg total) into the muscle as needed for anaphylaxis., Disp: 1 each, Rfl: 1   ezetimibe (ZETIA) 10 MG tablet, TAKE 1 TABLET(10 MG) BY MOUTH DAILY, Disp: 90 tablet, Rfl: 1   glimepiride (AMARYL) 1 MG tablet, Take by mouth., Disp: , Rfl:    glucose blood (CONTOUR NEXT TEST) test strip, Check BG 3 times/day.  Dx E11.65, Disp: , Rfl:     HYDROcodone-acetaminophen (NORCO/VICODIN) 5-325 MG tablet, Take 1 tablet by mouth every 6 (six) hours as needed for severe pain., Disp: 10 tablet, Rfl: 0   meloxicam (MOBIC) 15 MG tablet, TAKE 1/2 TO 1 TABLET BY MOUTH EVERY DAY AS NEEDED, Disp: 30 tablet, Rfl: 0   meloxicam (MOBIC) 15 MG tablet, Take 1 tablet (15 mg total) by mouth daily., Disp: 30 tablet, Rfl: 0   methocarbamol (ROBAXIN) 500 MG tablet, Take 1 tablet (500 mg total) by mouth 3 (three) times daily., Disp: 60 tablet, Rfl: 0   NIFEdipine (PROCARDIA XL/NIFEDICAL XL) 60 MG 24 hr tablet, Take 1 tablet by mouth daily, Disp: 90 tablet, Rfl: 1   rosuvastatin (CRESTOR) 10 MG tablet, TAKE 1 TABLET(10 MG) BY MOUTH AT BEDTIME, Disp: 90 tablet, Rfl: 1   spironolactone (ALDACTONE) 25 MG tablet, Take 1 tablet (25 mg total) by mouth daily., Disp: 90 tablet, Rfl: 0   tiZANidine (ZANAFLEX) 4 MG tablet, Take 1 tablet (4 mg total) by mouth every 8 (eight) hours as needed for muscle spasms., Disp: 40 tablet, Rfl: 0  tiZANidine (ZANAFLEX) 4 MG tablet, Take 1 tablet (4 mg total) by mouth at bedtime as needed., Disp: 30 tablet, Rfl: 0   TRESIBA FLEXTOUCH 200 UNIT/ML FlexTouch Pen, Inject into the skin., Disp: , Rfl:    TRULICITY 3 MG/0.5ML SOPN, Inject into the skin., Disp: , Rfl:   Observations/Objective: Patient is well-developed, well-nourished in no acute distress.  Resting comfortably  at home.  Head is normocephalic, atraumatic.  No labored breathing.  Speech is clear and coherent with logical content.  Patient is alert and oriented at baseline.    Assessment and Plan: 1. Allergic conjunctivitis of both eyes (Primary)  Cont to f/u with eye dr as scheduled. Likely is allergic pink eye.   Follow Up Instructions: I discussed the assessment and treatment plan with the patient. The patient was provided an opportunity to ask questions and all were answered. The patient agreed with the plan and demonstrated an understanding of the instructions.  A  copy of instructions were sent to the patient via MyChart unless otherwise noted below.   The patient was advised to call back or seek an in-person evaluation if the symptoms worsen or if the condition fails to improve as anticipated.    Cathlyn Parsons, NP

## 2023-04-28 NOTE — Patient Instructions (Signed)
Kathleen Jordan, thank you for joining Kathleen Parsons, NP for today's virtual visit.  While this provider is not your primary care provider (PCP), if your PCP is located in our provider database this encounter information will be shared with them immediately following your visit.   A [facility] MyChart account gives you access to today's visit and all your visits, tests, and labs performed at [facility] " click here if you don't have a [facility] MyChart account or go to [email or web address]  Consent: (Patient) Kathleen Jordan provided verbal consent for this virtual visit at the beginning of the encounter.  Current Medications:  Current Outpatient Medications:    albuterol (VENTOLIN HFA) 108 (90 Base) MCG/ACT inhaler, Inhale 2 puffs into the lungs every 6 (six) hours as needed for wheezing or shortness of breath., Disp: 8 g, Rfl: 0   cetirizine (ZYRTEC ALLERGY) 10 MG tablet, Take 1 tablet (10 mg total) by mouth daily., Disp: 30 tablet, Rfl: 11   cyclobenzaprine (FLEXERIL) 10 MG tablet, Take 1 tablet (10 mg total) by mouth 3 (three) times daily as needed for muscle spasms., Disp: 30 tablet, Rfl: 1   dicyclomine (BENTYL) 20 MG tablet, Take 20 mg by mouth every 6 (six) hours., Disp: , Rfl:    empagliflozin (JARDIANCE) 25 MG TABS tablet, Take 25 mg by mouth every evening. , Disp: , Rfl:    EPINEPHrine (AUVI-Q) 0.3 mg/0.3 mL IJ SOAJ injection, Inject 0.3 mLs (0.3 mg total) into the muscle as needed for anaphylaxis., Disp: 1 each, Rfl: 1   ezetimibe (ZETIA) 10 MG tablet, TAKE 1 TABLET(10 MG) BY MOUTH DAILY, Disp: 90 tablet, Rfl: 1   glimepiride (AMARYL) 1 MG tablet, Take by mouth., Disp: , Rfl:    glucose blood (CONTOUR NEXT TEST) test strip, Check BG 3 times/day.  Dx E11.65, Disp: , Rfl:    HYDROcodone-acetaminophen (NORCO/VICODIN) 5-325 MG tablet, Take 1 tablet by mouth every 6 (six) hours as needed for severe pain., Disp: 10 tablet, Rfl: 0   meloxicam (MOBIC)  15 MG tablet, TAKE 1/2 TO 1 TABLET BY MOUTH EVERY DAY AS NEEDED, Disp: 30 tablet, Rfl: 0   meloxicam (MOBIC) 15 MG tablet, Take 1 tablet (15 mg total) by mouth daily., Disp: 30 tablet, Rfl: 0   methocarbamol (ROBAXIN) 500 MG tablet, Take 1 tablet (500 mg total) by mouth 3 (three) times daily., Disp: 60 tablet, Rfl: 0   NIFEdipine (PROCARDIA XL/NIFEDICAL XL) 60 MG 24 hr tablet, Take 1 tablet by mouth daily, Disp: 90 tablet, Rfl: 1   rosuvastatin (CRESTOR) 10 MG tablet, TAKE 1 TABLET(10 MG) BY MOUTH AT BEDTIME, Disp: 90 tablet, Rfl: 1   spironolactone (ALDACTONE) 25 MG tablet, Take 1 tablet (25 mg total) by mouth daily., Disp: 90 tablet, Rfl: 0   tiZANidine (ZANAFLEX) 4 MG tablet, Take 1 tablet (4 mg total) by mouth every 8 (eight) hours as needed for muscle spasms., Disp: 40 tablet, Rfl: 0   tiZANidine (ZANAFLEX) 4 MG tablet, Take 1 tablet (4 mg total) by mouth at bedtime as needed., Disp: 30 tablet, Rfl: 0   TRESIBA FLEXTOUCH 200 UNIT/ML FlexTouch Pen, Inject into the skin., Disp: , Rfl:    TRULICITY 3 MG/0.5ML SOPN, Inject into the skin., Disp: , Rfl:    Medications ordered in this encounter:  No orders of the defined types were placed in this encounter.    *If you need refills on other medications prior to your next appointment, please contact your pharmacy*  Follow-Up:  Call back or seek an in-person evaluation if the symptoms worsen or if the condition fails to improve as anticipated.  [facility] [phone]  Other Instructions  Try allergy eye drops for this time of year when your eyes are itching. Patanol, Pataday, or Ophcon-A are brand names - it's ok to look for generic brands at the store to try.   Also try keeping your eye drops in the fridge - the cool drops can be soothing, too.   Continue to follow up with your eye dr.    If you have been instructed to have an in-person evaluation today at a local Urgent Care facility, please use the link below. It will  take you to a list of all of our available [facility], including address, phone number and hours of operation. Please do not delay care.  [facility]  If you or a family member do not have a primary care provider, use the link below to schedule a visit and establish care. When you choose a [facility] primary care physician or advanced practice provider, you gain a long-term partner in health. Find a Primary Care Provider  Learn more about [place]'s in-office and virtual care options: [facility] - Get Care Now

## 2023-05-01 ENCOUNTER — Encounter: Payer: 59 | Admitting: Family Medicine

## 2023-05-30 ENCOUNTER — Ambulatory Visit: Payer: 59 | Admitting: Family Medicine

## 2023-05-30 VITALS — BP 130/80 | HR 82 | Temp 98.5°F | Resp 18 | Ht 68.0 in | Wt 232.0 lb

## 2023-05-30 DIAGNOSIS — M79669 Pain in unspecified lower leg: Secondary | ICD-10-CM

## 2023-05-30 DIAGNOSIS — E785 Hyperlipidemia, unspecified: Secondary | ICD-10-CM

## 2023-05-30 DIAGNOSIS — E1165 Type 2 diabetes mellitus with hyperglycemia: Secondary | ICD-10-CM | POA: Diagnosis not present

## 2023-05-30 DIAGNOSIS — M79644 Pain in right finger(s): Secondary | ICD-10-CM

## 2023-05-30 DIAGNOSIS — G8929 Other chronic pain: Secondary | ICD-10-CM

## 2023-05-30 DIAGNOSIS — M25512 Pain in left shoulder: Secondary | ICD-10-CM

## 2023-05-30 DIAGNOSIS — Z0000 Encounter for general adult medical examination without abnormal findings: Secondary | ICD-10-CM | POA: Diagnosis not present

## 2023-05-30 DIAGNOSIS — Z0001 Encounter for general adult medical examination with abnormal findings: Secondary | ICD-10-CM

## 2023-05-30 DIAGNOSIS — Z23 Encounter for immunization: Secondary | ICD-10-CM

## 2023-05-30 DIAGNOSIS — M5441 Lumbago with sciatica, right side: Secondary | ICD-10-CM

## 2023-05-30 DIAGNOSIS — E1169 Type 2 diabetes mellitus with other specified complication: Secondary | ICD-10-CM

## 2023-05-30 DIAGNOSIS — I10 Essential (primary) hypertension: Secondary | ICD-10-CM

## 2023-05-30 DIAGNOSIS — E040 Nontoxic diffuse goiter: Secondary | ICD-10-CM

## 2023-05-30 LAB — CBC WITH DIFFERENTIAL/PLATELET
Basophils Absolute: 0 10*3/uL (ref 0.0–0.1)
Basophils Relative: 0.3 % (ref 0.0–3.0)
Eosinophils Absolute: 0.2 10*3/uL (ref 0.0–0.7)
Eosinophils Relative: 2.6 % (ref 0.0–5.0)
HCT: 43.6 % (ref 36.0–46.0)
Hemoglobin: 15 g/dL (ref 12.0–15.0)
Lymphocytes Relative: 37.9 % (ref 12.0–46.0)
Lymphs Abs: 3 10*3/uL (ref 0.7–4.0)
MCHC: 34.4 g/dL (ref 30.0–36.0)
MCV: 90.1 fl (ref 78.0–100.0)
Monocytes Absolute: 0.5 10*3/uL (ref 0.1–1.0)
Monocytes Relative: 6.6 % (ref 3.0–12.0)
Neutro Abs: 4.2 10*3/uL (ref 1.4–7.7)
Neutrophils Relative %: 52.6 % (ref 43.0–77.0)
Platelets: 267 10*3/uL (ref 150.0–400.0)
RBC: 4.84 Mil/uL (ref 3.87–5.11)
RDW: 12.9 % (ref 11.5–15.5)
WBC: 8 10*3/uL (ref 4.0–10.5)

## 2023-05-30 LAB — COMPREHENSIVE METABOLIC PANEL
ALT: 37 U/L — ABNORMAL HIGH (ref 0–35)
AST: 25 U/L (ref 0–37)
Albumin: 4.6 g/dL (ref 3.5–5.2)
Alkaline Phosphatase: 79 U/L (ref 39–117)
BUN: 12 mg/dL (ref 6–23)
CO2: 25 meq/L (ref 19–32)
Calcium: 9.7 mg/dL (ref 8.4–10.5)
Chloride: 105 meq/L (ref 96–112)
Creatinine, Ser: 0.78 mg/dL (ref 0.40–1.20)
GFR: 88.56 mL/min (ref >60.00)
Glucose, Bld: 155 mg/dL — ABNORMAL HIGH (ref 70–99)
Potassium: 3.9 meq/L (ref 3.5–5.1)
Sodium: 142 meq/L (ref 135–145)
Total Bilirubin: 0.4 mg/dL (ref 0.2–1.2)
Total Protein: 7.9 g/dL (ref 6.0–8.3)

## 2023-05-30 LAB — LIPID PANEL
Cholesterol: 123 mg/dL (ref 0–200)
HDL: 37.5 mg/dL — ABNORMAL LOW (ref >39.00)
LDL Cholesterol: 71 mg/dL (ref 0–99)
NonHDL: 85.91
Total CHOL/HDL Ratio: 3
Triglycerides: 75 mg/dL (ref 0.0–149.0)
VLDL: 15 mg/dL (ref 0.0–40.0)

## 2023-05-30 LAB — MICROALBUMIN / CREATININE URINE RATIO
Creatinine,U: 77.7 mg/dL
Microalb Creat Ratio: 10.2 mg/g (ref 0.0–30.0)
Microalb, Ur: 0.8 mg/dL (ref 0.0–1.9)

## 2023-05-30 LAB — TSH: TSH: 1.15 u[IU]/mL (ref 0.35–5.50)

## 2023-05-30 MED ORDER — NIFEDIPINE ER OSMOTIC RELEASE 60 MG PO TB24: ORAL_TABLET | ORAL | 1 refills | Status: DC

## 2023-05-30 MED ORDER — EZETIMIBE 10 MG PO TABS
ORAL_TABLET | ORAL | 1 refills | Status: DC
Start: 2023-05-30 — End: 2023-12-08

## 2023-05-30 NOTE — Patient Instructions (Signed)
 Preventive Care 16-51 Years Old, Female  Preventive care refers to lifestyle choices and visits with your health care provider that can promote health and wellness. Preventive care visits are also called wellness exams.  What can I expect for my preventive care visit?  Counseling  Your health care provider may ask you questions about your:  Medical history, including:  Past medical problems.  Family medical history.  Pregnancy history.  Current health, including:  Menstrual cycle.  Method of birth control.  Emotional well-being.  Home life and relationship well-being.  Sexual activity and sexual health.  Lifestyle, including:  Alcohol, nicotine or tobacco, and drug use.  Access to firearms.  Diet, exercise, and sleep habits.  Work and work Astronomer.  Sunscreen use.  Safety issues such as seatbelt and bike helmet use.  Physical exam  Your health care provider will check your:  Height and weight. These may be used to calculate your BMI (body mass index). BMI is a measurement that tells if you are at a healthy weight.  Waist circumference. This measures the distance around your waistline. This measurement also tells if you are at a healthy weight and may help predict your risk of certain diseases, such as type 2 diabetes and high blood pressure.  Heart rate and blood pressure.  Body temperature.  Skin for abnormal spots.  What immunizations do I need?    Vaccines are usually given at various ages, according to a schedule. Your health care provider will recommend vaccines for you based on your age, medical history, and lifestyle or other factors, such as travel or where you work.  What tests do I need?  Screening  Your health care provider may recommend screening tests for certain conditions. This may include:  Lipid and cholesterol levels.  Diabetes screening. This is done by checking your blood sugar (glucose) after you have not eaten for a while (fasting).  Pelvic exam and Pap test.  Hepatitis B test.  Hepatitis C  test.  HIV (human immunodeficiency virus) test.  STI (sexually transmitted infection) testing, if you are at risk.  Lung cancer screening.  Colorectal cancer screening.  Mammogram. Talk with your health care provider about when you should start having regular mammograms. This may depend on whether you have a family history of breast cancer.  BRCA-related cancer screening. This may be done if you have a family history of breast, ovarian, tubal, or peritoneal cancers.  Bone density scan. This is done to screen for osteoporosis.  Talk with your health care provider about your test results, treatment options, and if necessary, the need for more tests.  Follow these instructions at home:  Eating and drinking    Eat a diet that includes fresh fruits and vegetables, whole grains, lean protein, and low-fat dairy products.  Take vitamin and mineral supplements as recommended by your health care provider.  Do not drink alcohol if:  Your health care provider tells you not to drink.  You are pregnant, may be pregnant, or are planning to become pregnant.  If you drink alcohol:  Limit how much you have to 0-1 drink a day.  Know how much alcohol is in your drink. In the [place] one drink equals one 12 oz bottle of beer (355 mL), one 5 oz glass of wine (148 mL), or one 1 oz glass of hard liquor (44 mL).  Lifestyle  Brush your teeth every morning and night with fluoride toothpaste. Floss one time each day.  Exercise for at least  30 minutes 5 or more days each week.  Do not use any products that contain nicotine or tobacco. These products include cigarettes, chewing tobacco, and vaping devices, such as e-cigarettes. If you need help quitting, ask your health care provider.  Do not use drugs.  If you are sexually active, practice safe sex. Use a condom or other form of protection to prevent STIs.  If you do not wish to become pregnant, use a form of birth control. If you plan to become pregnant, see your health care provider for a  prepregnancy visit.  Take aspirin only as told by your health care provider. Make sure that you understand how much to take and what form to take. Work with your health care provider to find out whether it is safe and beneficial for you to take aspirin daily.  Find healthy ways to manage stress, such as:  Meditation, yoga, or listening to music.  Journaling.  Talking to a trusted person.  Spending time with friends and family.  Minimize exposure to UV radiation to reduce your risk of skin cancer.  Safety  Always wear your seat belt while driving or riding in a vehicle.  Do not drive:  If you have been drinking alcohol. Do not ride with someone who has been drinking.  When you are tired or distracted.  While texting.  If you have been using any mind-altering substances or drugs.  Wear a helmet and other protective equipment during sports activities.  If you have firearms in your house, make sure you follow all gun safety procedures.  Seek help if you have been physically or sexually abused.  What's next?  Visit your health care provider once a year for an annual wellness visit.  Ask your health care provider how often you should have your eyes and teeth checked.  Stay up to date on all vaccines.  This information is not intended to replace advice given to you by your health care provider. Make sure you discuss any questions you have with your health care provider.  Document Revised: 09/13/2020 Document Reviewed: 09/13/2020  Elsevier Patient Education  2024 [employer].

## 2023-05-30 NOTE — Assessment & Plan Note (Signed)
 Pt has app for US thyroid coming up

## 2023-05-30 NOTE — Assessment & Plan Note (Signed)
 Well controlled, no changes to meds. Encouraged heart healthy diet such as the DASH diet and exercise as tolerated.

## 2023-05-30 NOTE — Assessment & Plan Note (Signed)
 Per endo

## 2023-05-30 NOTE — Assessment & Plan Note (Signed)
 Ghm  utd Check labs  See [facility] Health Maintenance  Topic Date Due   Cervical Cancer Screening (HPV/Pap Cotest)  12/12/2013   Pneumococcal Vaccine 7-51 Years old (2 of 2 - PCV) 03/01/2015   OPHTHALMOLOGY EXAM  12/11/2018   Diabetic kidney evaluation - Urine ACR  02/17/2021   Diabetic kidney evaluation - eGFR measurement  06/13/2022   DTaP/Tdap/Td (3 - Td or Tdap) 11/03/2022   COVID-19 Vaccine (4 - 2024-25 season) 12/01/2022   MAMMOGRAM  12/23/2022   Zoster Vaccines- Shingrix (1 of 2) Never done   HEMOGLOBIN A1C  11/11/2023   FOOT EXAM  05/15/2024   Colonoscopy  12/09/2032   INFLUENZA VACCINE  Completed   Hepatitis C Screening  Completed   HIV Screening  Completed   HPV VACCINES  Aged Out
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
## 2023-05-30 NOTE — Assessment & Plan Note (Signed)
 Encourage heart healthy diet such as MIND or DASH diet, increase exercise, avoid trans fats, simple carbohydrates and processed foods, consider a krill or fish or flaxseed oil cap daily.

## 2023-06-04 ENCOUNTER — Encounter: Payer: Self-pay | Admitting: Family Medicine

## 2023-06-27 ENCOUNTER — Ambulatory Visit: Payer: 59 | Admitting: Sports Medicine

## 2023-07-07 ENCOUNTER — Other Ambulatory Visit: Payer: Self-pay | Admitting: Family Medicine

## 2023-07-07 DIAGNOSIS — Z8742 Personal history of other diseases of the female genital tract: Secondary | ICD-10-CM

## 2023-07-07 MED ORDER — SPIRONOLACTONE 25 MG PO TABS: 25.0000 mg | ORAL_TABLET | Freq: Every day | ORAL | 1 refills | Status: AC

## 2023-07-22 ENCOUNTER — Encounter: Payer: Self-pay | Admitting: Family Medicine

## 2023-10-08 ENCOUNTER — Encounter: Payer: Self-pay | Admitting: Family Medicine

## 2023-10-08 DIAGNOSIS — E785 Hyperlipidemia, unspecified: Secondary | ICD-10-CM

## 2023-10-09 ENCOUNTER — Other Ambulatory Visit: Payer: Self-pay | Admitting: Family Medicine

## 2023-10-09 DIAGNOSIS — E785 Hyperlipidemia, unspecified: Secondary | ICD-10-CM

## 2023-10-09 MED ORDER — ROSUVASTATIN CALCIUM 10 MG PO TABS
10.0000 mg | ORAL_TABLET | Freq: Every day | ORAL | 3 refills | Status: DC
Start: 2023-10-09 — End: 2023-10-10

## 2023-10-10 MED ORDER — ROSUVASTATIN CALCIUM 10 MG PO TABS
ORAL_TABLET | ORAL | 1 refills | Status: AC
Start: 2023-10-10 — End: ?

## 2023-10-17 ENCOUNTER — Encounter: Payer: Self-pay | Admitting: Advanced Practice Midwife

## 2023-12-08 ENCOUNTER — Other Ambulatory Visit: Payer: Self-pay | Admitting: Family Medicine

## 2023-12-08 DIAGNOSIS — E785 Hyperlipidemia, unspecified: Secondary | ICD-10-CM

## 2024-02-07 ENCOUNTER — Telehealth: Payer: Self-pay | Admitting: Family Medicine

## 2024-02-07 DIAGNOSIS — E785 Hyperlipidemia, unspecified: Secondary | ICD-10-CM

## 2024-02-09 NOTE — Telephone Encounter (Signed)
 Lvm for her to sched

## 2024-02-09 NOTE — Telephone Encounter (Signed)
 Kathleen Jordan- Pt is overdue for follow-up. Can you try reaching out to Pt to schedule appt please?

## 2024-03-08 ENCOUNTER — Other Ambulatory Visit: Payer: Self-pay | Admitting: Medical Genetics

## 2024-03-12 ENCOUNTER — Ambulatory Visit: Admitting: Family Medicine

## 2024-04-07 ENCOUNTER — Other Ambulatory Visit: Payer: Self-pay | Admitting: Family Medicine

## 2024-04-07 DIAGNOSIS — I10 Essential (primary) hypertension: Secondary | ICD-10-CM

## 2024-04-18 ENCOUNTER — Other Ambulatory Visit: Payer: Self-pay | Admitting: Family Medicine

## 2024-04-18 DIAGNOSIS — I10 Essential (primary) hypertension: Secondary | ICD-10-CM

## 2024-04-24 ENCOUNTER — Telehealth

## 2024-04-28 ENCOUNTER — Other Ambulatory Visit: Payer: Self-pay | Admitting: Medical Genetics

## 2024-04-28 DIAGNOSIS — Z006 Encounter for examination for normal comparison and control in clinical research program: Secondary | ICD-10-CM

## 2024-05-06 ENCOUNTER — Telehealth

## 2024-05-06 DIAGNOSIS — M79605 Pain in left leg: Secondary | ICD-10-CM | POA: Diagnosis not present

## 2024-05-06 MED ORDER — MELOXICAM 15 MG PO TABS: 15.0000 mg | ORAL_TABLET | Freq: Every day | ORAL | 0 refills | Status: AC | PRN

## 2024-05-06 NOTE — Patient Instructions (Signed)
" °  [name], thank you for joining [name], PA-C for today's virtual visit.  While this provider is not your primary care provider (PCP), if your PCP is located in our provider database this encounter information will be shared with them immediately following your visit.   A [facility] MyChart account gives you access to today's visit and all your visits, tests, and labs performed at [facility]  click here if you don't have a [facility] MyChart account or go to [email or web address]  Consent: (Patient) Kathleen Jordan provided verbal consent for this virtual visit at the beginning of the encounter.  Current Medications:  Current Outpatient Medications:    cetirizine  (ZYRTEC  ALLERGY ) 10 MG tablet, Take 1 tablet (10 mg total) by mouth daily., Disp: 30 tablet, Rfl: 11   dicyclomine (BENTYL) 20 MG tablet, Take 20 mg by mouth every 6 (six) hours., Disp: , Rfl:    ezetimibe  (ZETIA ) 10 MG tablet, Take 1 tablet by mouth daily. Please schedule an appointment., Disp: 15 tablet, Rfl: 0   glimepiride (AMARYL) 1 MG tablet, Take by mouth., Disp: , Rfl:    glucose blood (CONTOUR NEXT TEST) test strip, Check BG 3 times/day.  Dx E11.65, Disp: , Rfl:    NIFEdipine  (PROCARDIA  XL/NIFEDICAL XL) 60 MG 24 hr tablet, Take 1 tablet by mouth daily., Disp: 90 tablet, Rfl: 0   rosuvastatin  (CRESTOR ) 10 MG tablet, TAKE 1 TABLET(10 MG) BY MOUTH AT BEDTIME, Disp: 90 tablet, Rfl: 1   spironolactone  (ALDACTONE ) 25 MG tablet, Take 1 tablet (25 mg total) by mouth daily., Disp: 90 tablet, Rfl: 1   TRESIBA FLEXTOUCH 200 UNIT/ML FlexTouch Pen, Inject into the skin., Disp: , Rfl:    Medications ordered in this encounter:  No orders of the defined types were placed in this encounter.    *If you need refills on other medications prior to your next appointment, please contact your pharmacy*  Follow-Up: Call back or seek an in-person evaluation if the symptoms worsen or if the  condition fails to improve as anticipated.   Other Instructions Try to elevate the leg when resting. The meloxicam  can be used once daily as needed for episodes of pain. Okay to continue OTC Tylenol  as well. Make sure to give your primary care office a call this morning to schedule an in person evaluation with Dr. Antonio, as this needs further workup, especially giving it has worsened since it first started a year and a half ago.  Please do not delay care!   If you have been instructed to have an in-person evaluation today at a local Urgent Care facility, please use the link below. It will take you to a list of all of our available [facility], including address, phone number and hours of operation. Please do not delay care.  [facility]  If you or a family member do not have a primary care provider, use the link below to schedule a visit and establish care. When you choose a [facility] primary care physician or advanced practice provider, you gain a long-term partner in health. Find a Primary Care Provider  Learn more about [facility]'s in-office and virtual care options: [facility] - Get Care Now  "

## 2024-05-06 NOTE — Progress Notes (Signed)
 " Virtual Visit Consent   Kathleen Jordan, you are scheduled for a virtual visit with a [facility] provider today. Just as with appointments in the office, your consent must be obtained to participate. Your consent will be active for this visit and any virtual visit you may have with one of our providers in the next 365 days. If you have a MyChart account, a copy of this consent can be sent to you electronically.  As this is a virtual visit, video technology does not allow for your provider to perform a traditional examination. This may limit your provider's ability to fully assess your condition. If your provider identifies any concerns that need to be evaluated in person or the need to arrange testing (such as labs, EKG, etc.), we will make arrangements to do so. Although advances in technology are sophisticated, we cannot ensure that it will always work on either your end or our end. If the connection with a video visit is poor, the visit may have to be switched to a telephone visit. With either a video or telephone visit, we are not always able to ensure that we have a secure connection.  By engaging in this virtual visit, you consent to the provision of healthcare and authorize for your insurance to be billed (if applicable) for the services provided during this visit. Depending on your insurance coverage, you may receive a charge related to this service.  I need to obtain your verbal consent now. Are you willing to proceed with your visit today? Kathleen Jordan has provided verbal consent on 05/06/2024 for a virtual visit (video or telephone). Kathleen Jordan, [place]  Date: 05/06/2024 9:01 AM   Virtual Visit via Video Note   I, Kathleen Jordan, connected with  Kathleen Jordan  ([id number], 02-May-1970) on 05/06/24 at  8:45 AM EST by a video-enabled telemedicine application and verified that I am speaking with the correct person using two  identifiers.  Location: Patient: Virtual Visit Location Patient: Home Provider: Virtual Visit Location Provider: Home Office   I discussed the limitations of evaluation and management by telemedicine and the availability of in person appointments. The patient expressed understanding and agreed to proceed.    History of Present Illness: Kathleen Jordan is a 54 y.o. who identifies as a female who was assigned female at birth, and is being seen today for 1-1/2 years of intermittent pain in her left lower extremity, worsening over the past 2 to 3 weeks.  Denies trauma or injury to explain worsening symptoms.  Endorses pain will start often in her foot and slowly ascended up her calf and thigh.  Can sometimes happen when she is up and moving around, but often happens when she has been sitting for prolonged periods of time.  She notes the leg will feel warmer to her and will occasionally swell.  At onset of symptoms, she discussed with her PCP who ordered an ultrasound of the lower extremity to rule out a DVT.  This was negative.  Notes sometimes the pain will start more proximally and migrate distally.  She denies any associated back or buttock pain.  Denies numbness or tingling.  Denies any substantial coloration difference between her left lower extremity and right lower extremity when this happens.  Denies symptoms of right lower extremity.  Does have history of diabetes with last A1c at 6.8.  No known history of other risk factors for peripheral neuropathy.  Takes Tylenol  OTC when the pain is  present, but that typically does not help much.   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   Dysfunction of eustachian tube 11/22/2019   Epigastric pain 08/16/2019   Other chest pain 08/16/2019   Pain of left calf 08/16/2019   RUQ pain 08/16/2019   Body aches 05/12/2019   History of frequent URI 05/12/2019   Acute non-recurrent pansinusitis 03/12/2019   Perennial allergic rhinitis 01/22/2019   Adverse food reaction 01/22/2019   Shortness of breath 01/22/2019   Allergic conjunctivitis of both eyes 01/22/2019   Hyperlipidemia associated with type 2 diabetes mellitus (HCC) 06/13/2018   Menorrhagia 08/13/2017   Simple endometrial hyperplasia without atypia 08/13/2017   Gastroesophageal reflux disease 07/24/2017   Rectal bleeding 04/02/2017   Anal fissure 04/02/2017   Constipation 04/02/2017   Vaginal itching 01/31/2017   Preventative health care 06/20/2016   IBS (irritable bowel syndrome) 09/07/2015   Knee pain, acute 06/01/2014   Obesity (BMI 30-39.9) 06/15/2013   Postpartum care following vaginal delivery (8/15) 11/13/2012   Frequent sinus infections 07/02/2011   OSA (obstructive sleep apnea) 12/06/2010   IDDM (insulin  dependent diabetes mellitus) (HCC) 11/23/2010   Headache 12/08/2009   POLYCYSTIC OVARIES 08/02/2009   MORBID OBESITY 06/29/2009   Simple goiter 01/10/2009   Dysphagia 01/10/2009   ABSCESS, SKIN 11/10/2007   Hypertension 05/21/2007   SINUSITIS- ACUTE-NOS 05/21/2007   Essential hypertension 09/18/2006    Allergies: Allergies[1] Medications: Current Medications[2]  Observations/Objective: Patient is well-developed, well-nourished in no acute distress.  Resting comfortably  at home.  Head is normocephalic, atraumatic.  No labored breathing.  Speech is clear and coherent with logical content.  Patient is alert and oriented at baseline.    Assessment and Plan: 1. Pain of left lower extremity (Primary)  Ongoing and progressive.  Needs a further evaluation than what can be  provided via a virtual urgent care visit.  Cannot fully exclude some form of arterial insufficiency/claudication, but since sometimes the symptoms will happen at rest, with prolonged positioning, this makes it less likely.  Other concerns would be for intermittent nerve impingement, unilateral peripheral neuropathy or venous valvular insufficiency, especially giving periodic warmth of the extremity with swelling.  Supportive measures reviewed.  Will provide prescription for meloxicam  to help with any pain until she can follow-up with her primary care provider for further workup.  Will send copy of this chart to PCP so that they are aware, but patient is supposed to call her primary care office this morning to get an appointment scheduled.  Follow Up Instructions: I discussed the assessment and treatment plan with the patient. The patient was provided an opportunity to ask questions and all were answered. The patient agreed with the plan and demonstrated an understanding of the instructions.  A copy of instructions were sent to the patient via MyChart unless otherwise noted below.    The patient was advised to call back or seek an in-person evaluation if the symptoms worsen or if the condition fails to improve as anticipated.    Kathleen Velma Lunger, PA-C    [1] No Known Allergies [2]  Current Outpatient Medications:  meloxicam  (MOBIC ) 15 MG tablet, Take 1 tablet (15 mg total) by mouth daily as needed for pain., Disp: 30 tablet, Rfl: 0   dicyclomine (BENTYL) 20 MG tablet, Take 20 mg by mouth every 6 (six) hours., Disp: , Rfl:    ezetimibe  (ZETIA ) 10 MG tablet, Take 1 tablet by mouth daily. Please schedule an appointment., Disp: 15 tablet, Rfl: 0   glimepiride (AMARYL) 1 MG tablet, Take by mouth., Disp: , Rfl:    glucose blood (CONTOUR NEXT TEST) test strip, Check BG 3 times/day.  Dx E11.65, Disp: , Rfl:    NIFEdipine  (PROCARDIA  XL/NIFEDICAL XL) 60 MG 24 hr tablet, Take 1 tablet by mouth daily.,  Disp: 90 tablet, Rfl: 0   rosuvastatin  (CRESTOR ) 10 MG tablet, TAKE 1 TABLET(10 MG) BY MOUTH AT BEDTIME, Disp: 90 tablet, Rfl: 1   spironolactone  (ALDACTONE ) 25 MG tablet, Take 1 tablet (25 mg total) by mouth daily., Disp: 90 tablet, Rfl: 1   TRESIBA FLEXTOUCH 200 UNIT/ML FlexTouch Pen, Inject into the skin., Disp: , Rfl:   "

## 2024-05-28 ENCOUNTER — Other Ambulatory Visit (HOSPITAL_COMMUNITY)

## 2024-06-01 ENCOUNTER — Encounter: Admitting: Family Medicine
